# Patient Record
Sex: Female | Born: 1940 | Race: White | Hispanic: No | Smoking: Former smoker
Health system: Southern US, Community
[De-identification: ages and names within clinical notes are randomized; demographics above are authoritative.]

## PROBLEM LIST (undated history)

## (undated) DIAGNOSIS — Z8673 Personal history of transient ischemic attack (TIA), and cerebral infarction without residual deficits: Secondary | ICD-10-CM

## (undated) DIAGNOSIS — M4802 Spinal stenosis, cervical region: Secondary | ICD-10-CM

## (undated) DIAGNOSIS — I350 Nonrheumatic aortic (valve) stenosis: Secondary | ICD-10-CM

## (undated) DIAGNOSIS — R42 Dizziness and giddiness: Secondary | ICD-10-CM

## (undated) DIAGNOSIS — R251 Tremor, unspecified: Secondary | ICD-10-CM

## (undated) DIAGNOSIS — I4891 Unspecified atrial fibrillation: Secondary | ICD-10-CM

## (undated) DIAGNOSIS — M797 Fibromyalgia: Secondary | ICD-10-CM

## (undated) DIAGNOSIS — M1990 Unspecified osteoarthritis, unspecified site: Secondary | ICD-10-CM

## (undated) DIAGNOSIS — I509 Heart failure, unspecified: Secondary | ICD-10-CM

## (undated) DIAGNOSIS — I482 Chronic atrial fibrillation, unspecified: Secondary | ICD-10-CM

## (undated) DIAGNOSIS — R12 Heartburn: Secondary | ICD-10-CM

## (undated) DIAGNOSIS — Z95810 Presence of automatic (implantable) cardiac defibrillator: Secondary | ICD-10-CM

## (undated) DIAGNOSIS — G2581 Restless legs syndrome: Secondary | ICD-10-CM

## (undated) DIAGNOSIS — E785 Hyperlipidemia, unspecified: Secondary | ICD-10-CM

## (undated) DIAGNOSIS — I499 Cardiac arrhythmia, unspecified: Secondary | ICD-10-CM

## (undated) DIAGNOSIS — I2510 Atherosclerotic heart disease of native coronary artery without angina pectoris: Secondary | ICD-10-CM

## (undated) DIAGNOSIS — G4730 Sleep apnea, unspecified: Secondary | ICD-10-CM

## (undated) DIAGNOSIS — Z8719 Personal history of other diseases of the digestive system: Secondary | ICD-10-CM

## (undated) DIAGNOSIS — I639 Cerebral infarction, unspecified: Secondary | ICD-10-CM

## (undated) DIAGNOSIS — T8859XA Other complications of anesthesia, initial encounter: Secondary | ICD-10-CM

## (undated) DIAGNOSIS — I209 Angina pectoris, unspecified: Secondary | ICD-10-CM

## (undated) DIAGNOSIS — R413 Other amnesia: Secondary | ICD-10-CM

## (undated) DIAGNOSIS — I071 Rheumatic tricuspid insufficiency: Secondary | ICD-10-CM

## (undated) DIAGNOSIS — R0602 Shortness of breath: Secondary | ICD-10-CM

## (undated) DIAGNOSIS — T4145XA Adverse effect of unspecified anesthetic, initial encounter: Secondary | ICD-10-CM

## (undated) DIAGNOSIS — G459 Transient cerebral ischemic attack, unspecified: Secondary | ICD-10-CM

## (undated) DIAGNOSIS — I429 Cardiomyopathy, unspecified: Secondary | ICD-10-CM

## (undated) DIAGNOSIS — F419 Anxiety disorder, unspecified: Secondary | ICD-10-CM

## (undated) DIAGNOSIS — R51 Headache: Secondary | ICD-10-CM

## (undated) DIAGNOSIS — Q396 Congenital diverticulum of esophagus: Secondary | ICD-10-CM

## (undated) DIAGNOSIS — I872 Venous insufficiency (chronic) (peripheral): Secondary | ICD-10-CM

## (undated) DIAGNOSIS — E538 Deficiency of other specified B group vitamins: Secondary | ICD-10-CM

## (undated) DIAGNOSIS — I340 Nonrheumatic mitral (valve) insufficiency: Secondary | ICD-10-CM

## (undated) DIAGNOSIS — F32A Depression, unspecified: Secondary | ICD-10-CM

## (undated) DIAGNOSIS — I10 Essential (primary) hypertension: Secondary | ICD-10-CM

## (undated) DIAGNOSIS — Z8711 Personal history of peptic ulcer disease: Secondary | ICD-10-CM

## (undated) DIAGNOSIS — R911 Solitary pulmonary nodule: Secondary | ICD-10-CM

## (undated) DIAGNOSIS — S2249XA Multiple fractures of ribs, unspecified side, initial encounter for closed fracture: Secondary | ICD-10-CM

## (undated) HISTORY — PX: CERVICAL DISC SURGERY: SHX588

## (undated) HISTORY — DX: Personal history of other diseases of the digestive system: Z87.19

## (undated) HISTORY — PX: APPENDECTOMY: SHX54

## (undated) HISTORY — PX: JOINT REPLACEMENT: SHX530

## (undated) HISTORY — PX: INSERT / REPLACE / REMOVE PACEMAKER: SUR710

## (undated) HISTORY — DX: Unspecified atrial fibrillation: I48.91

## (undated) HISTORY — DX: Congenital diverticulum of esophagus: Q39.6

## (undated) HISTORY — PX: CARDIAC CATHETERIZATION: SHX172

## (undated) HISTORY — DX: Personal history of peptic ulcer disease: Z87.11

## (undated) HISTORY — DX: Hyperlipidemia, unspecified: E78.5

## (undated) HISTORY — DX: Transient cerebral ischemic attack, unspecified: G45.9

## (undated) HISTORY — DX: Cerebral infarction, unspecified: I63.9

## (undated) HISTORY — DX: Heartburn: R12

## (undated) HISTORY — PX: OTHER SURGICAL HISTORY: SHX169

## (undated) HISTORY — PX: CHOLECYSTECTOMY: SHX55

## (undated) HISTORY — PX: BILATERAL OOPHORECTOMY: SHX1221

## (undated) HISTORY — PX: TUBAL LIGATION: SHX77

## (undated) HISTORY — PX: TONSILLECTOMY: SUR1361

---

## 1997-08-12 ENCOUNTER — Inpatient Hospital Stay (HOSPITAL_COMMUNITY): Admission: RE | Admit: 1997-08-12 | Discharge: 1997-08-14 | Payer: Self-pay | Admitting: Cardiology

## 1997-09-12 ENCOUNTER — Inpatient Hospital Stay (HOSPITAL_COMMUNITY): Admission: EM | Admit: 1997-09-12 | Discharge: 1997-09-14 | Payer: Self-pay | Admitting: Cardiology

## 1998-01-07 ENCOUNTER — Inpatient Hospital Stay (HOSPITAL_COMMUNITY): Admission: EM | Admit: 1998-01-07 | Discharge: 1998-01-08 | Payer: Self-pay | Admitting: Emergency Medicine

## 1998-01-08 ENCOUNTER — Encounter: Payer: Self-pay | Admitting: Cardiology

## 2003-10-16 ENCOUNTER — Other Ambulatory Visit: Payer: Self-pay

## 2003-10-17 ENCOUNTER — Other Ambulatory Visit: Payer: Self-pay

## 2005-04-08 ENCOUNTER — Ambulatory Visit: Payer: Self-pay | Admitting: Nurse Practitioner

## 2005-05-30 ENCOUNTER — Ambulatory Visit: Payer: Self-pay | Admitting: *Deleted

## 2005-07-10 ENCOUNTER — Ambulatory Visit: Payer: Self-pay | Admitting: Nurse Practitioner

## 2005-12-09 ENCOUNTER — Other Ambulatory Visit: Payer: Self-pay

## 2005-12-09 ENCOUNTER — Emergency Department: Payer: Self-pay | Admitting: Emergency Medicine

## 2006-12-07 ENCOUNTER — Ambulatory Visit: Payer: Self-pay | Admitting: Internal Medicine

## 2007-02-09 ENCOUNTER — Ambulatory Visit: Payer: Self-pay | Admitting: Internal Medicine

## 2007-02-11 ENCOUNTER — Observation Stay (HOSPITAL_COMMUNITY): Admission: RE | Admit: 2007-02-11 | Discharge: 2007-02-12 | Payer: Self-pay | Admitting: Internal Medicine

## 2007-02-11 ENCOUNTER — Ambulatory Visit: Payer: Self-pay | Admitting: Internal Medicine

## 2007-02-26 ENCOUNTER — Ambulatory Visit: Payer: Self-pay

## 2007-06-23 ENCOUNTER — Ambulatory Visit: Payer: Self-pay | Admitting: Internal Medicine

## 2007-07-07 ENCOUNTER — Ambulatory Visit: Payer: Self-pay | Admitting: Unknown Physician Specialty

## 2007-08-20 ENCOUNTER — Ambulatory Visit: Payer: Self-pay | Admitting: Unknown Physician Specialty

## 2007-10-04 ENCOUNTER — Encounter: Admission: RE | Admit: 2007-10-04 | Discharge: 2007-10-04 | Payer: Self-pay | Admitting: Neurology

## 2007-10-25 ENCOUNTER — Ambulatory Visit: Payer: Self-pay | Admitting: Internal Medicine

## 2008-01-17 ENCOUNTER — Ambulatory Visit: Payer: Self-pay | Admitting: Internal Medicine

## 2008-03-01 ENCOUNTER — Ambulatory Visit: Payer: Self-pay | Admitting: Internal Medicine

## 2008-03-15 ENCOUNTER — Encounter: Payer: Self-pay | Admitting: Internal Medicine

## 2008-04-24 ENCOUNTER — Ambulatory Visit: Payer: Self-pay | Admitting: Internal Medicine

## 2008-09-07 ENCOUNTER — Emergency Department: Payer: Self-pay | Admitting: Emergency Medicine

## 2009-03-06 ENCOUNTER — Ambulatory Visit: Payer: Self-pay | Admitting: Gynecologic Oncology

## 2009-03-27 ENCOUNTER — Ambulatory Visit: Payer: Self-pay | Admitting: Gynecologic Oncology

## 2009-04-03 ENCOUNTER — Ambulatory Visit: Payer: Self-pay | Admitting: Gynecologic Oncology

## 2009-04-19 ENCOUNTER — Ambulatory Visit: Payer: Self-pay | Admitting: Unknown Physician Specialty

## 2009-04-24 ENCOUNTER — Ambulatory Visit: Payer: Self-pay | Admitting: Unknown Physician Specialty

## 2009-05-04 ENCOUNTER — Ambulatory Visit: Payer: Self-pay | Admitting: Gynecologic Oncology

## 2009-07-17 ENCOUNTER — Ambulatory Visit: Payer: Self-pay | Admitting: Rheumatology

## 2009-07-23 ENCOUNTER — Ambulatory Visit: Payer: Self-pay | Admitting: Unknown Physician Specialty

## 2009-08-02 ENCOUNTER — Ambulatory Visit: Payer: Self-pay | Admitting: Pain Medicine

## 2009-08-13 ENCOUNTER — Ambulatory Visit: Payer: Self-pay | Admitting: Pain Medicine

## 2009-09-13 ENCOUNTER — Ambulatory Visit: Payer: Self-pay | Admitting: Pain Medicine

## 2009-09-14 ENCOUNTER — Encounter (INDEPENDENT_AMBULATORY_CARE_PROVIDER_SITE_OTHER): Payer: Self-pay | Admitting: *Deleted

## 2009-09-24 ENCOUNTER — Ambulatory Visit: Payer: Self-pay | Admitting: Pain Medicine

## 2009-09-24 ENCOUNTER — Other Ambulatory Visit: Payer: Self-pay | Admitting: Pain Medicine

## 2009-11-07 ENCOUNTER — Ambulatory Visit: Payer: Self-pay | Admitting: Pain Medicine

## 2009-11-09 ENCOUNTER — Ambulatory Visit: Payer: Self-pay | Admitting: Pain Medicine

## 2009-11-19 ENCOUNTER — Ambulatory Visit: Payer: Self-pay | Admitting: Pain Medicine

## 2009-12-11 ENCOUNTER — Ambulatory Visit: Payer: Self-pay | Admitting: Pain Medicine

## 2009-12-17 ENCOUNTER — Ambulatory Visit: Payer: Self-pay | Admitting: Pain Medicine

## 2009-12-25 ENCOUNTER — Ambulatory Visit: Payer: Self-pay | Admitting: Urology

## 2010-01-17 ENCOUNTER — Ambulatory Visit: Payer: Self-pay | Admitting: Pain Medicine

## 2010-01-23 ENCOUNTER — Ambulatory Visit: Payer: Self-pay | Admitting: Pain Medicine

## 2010-02-21 ENCOUNTER — Ambulatory Visit: Payer: Medicare Other | Admitting: Pain Medicine

## 2010-02-26 ENCOUNTER — Emergency Department: Payer: Self-pay | Admitting: Emergency Medicine

## 2010-03-05 NOTE — Letter (Signed)
Summary: Device-Delinquent Check  [facility], Main Office  [address]   [place], [place] [address]   Phone: [phone]  Fax: [phone]     September 14, 2009 MRN: [id number]   [facility] [address] [place], [place]  [address]   Dear [name],  According to our records, you have not had your implanted device checked in the recommended period of time.  We are unable to determine appropriate device function without checking your device on a regular basis.  Please call our office to schedule an appointment, with Dr. Graciela Husbands,  as soon as possible.  If you are having your device checked by another physician, please call us so that we may update our records.  Thank you,  Altha Harm, LPN  September 14, 2009 2:14 PM  [facility]  certified mail

## 2010-03-11 ENCOUNTER — Ambulatory Visit: Payer: Self-pay | Admitting: Pain Medicine

## 2010-03-18 ENCOUNTER — Ambulatory Visit: Payer: Self-pay | Admitting: Pain Medicine

## 2010-04-09 ENCOUNTER — Ambulatory Visit: Payer: Self-pay | Admitting: Pain Medicine

## 2010-05-07 ENCOUNTER — Ambulatory Visit: Payer: Self-pay | Admitting: Pain Medicine

## 2010-05-20 ENCOUNTER — Ambulatory Visit: Payer: Self-pay | Admitting: Pain Medicine

## 2010-06-08 ENCOUNTER — Other Ambulatory Visit: Payer: Self-pay | Admitting: Internal Medicine

## 2010-06-18 NOTE — Letter (Signed)
February 09, 2007    [name], M.D.  [address]  [place], [place] [address]   RE:  [name], [name]  MRN:  [id number]  /  DOB:  Oct 08, 1940   Dear [name]:   It is a pleasure to see [name] at your request to consider CRT  upgrade for her pacemaker previously implanted for uncontrolled atrial  fibrillation and status post AV junction ablation.  This was undertaken  by Dr. Patrecia Pace down at [facility] about three years ago.  Unfortunately it was not associated with any significant improvement in  her exercise tolerance.   She has significant LV dysfunction with ejection fraction of about 25%-  30%; somewhat interestingly her __________  scan demonstrated ejection  fraction of 50% but echocardiogram and catheterization both confirmed  the ejection fraction was about 30%.  She also had an elevated left  ventricular diastolic pressure at catheterization; her coronary arteries  were normal.   She sleeps with three pillows, but only one behind her head.  She has  peripheral edema but she does not have nocturnal dyspnea.  She has had  no recent syncope.  She had had syncope in the remote past.   As noted she is status post pacemaker implantation for AV ablation; she  is pacer dependent.   Her past medical history is notable for:  1. Reflux disease and hiatal hernia.  2. Arthritis.  3. Diabetes.  4. Fatigue.  5. Asthma.  6. Depression.   Her past cardiac history in addition to rhe above is notable for an  attempted ablation of something in November of 2004.  It should be noted  that Dr. Windell Norfolk did the procedure at [facility] failing an attempt to  __________  and maintain sinus rhythm.   Her past surgical history is notable for:  1. C-sections x4.  2,  Arthroscopic surgery x2 with total knee replacements.  1. Vulvar surgery.   Her review of systems is broadly negative apart from the above except  for the fact that her son after six years of amyotrophic  lateral  sclerosis has decided to, stop fighting.   Social History:  She is divorced.  She bore four children.  One died  just prior to delivery in a motor vehicle accident.  Another son died at  2 1/2 from a drowning accident and as note above, a third child is dying  of amyotrophic lateral sclerosis.   She does not use cigarettes, alcohol or recreational drugs.   Her current medications include:  1. Actos 15  2. Hydrochlorothiazide 25  3. Lipitor 10.  4. Meloxicam 7.5.  5. Metoprolol 50 b.i.d.  6. Omeprazole 20.  7. Quinapril 40.  8. Sertraline 50.  9. Warfarin.  10.Loratadine.   She is allergic to SULFA and DOPAMINE.   On examination she is an elderly Caucasian female appearing somewhat  older than her stated age of 68.  Her height is 5'4 and her weight is  280 pounds.  Her pulse is 70.  Her HEENT exam demonstrated no __________  The neck veins were 8-9 cm.  The carotids were brisk and full  bilaterally without bruits.  The back was without kyphosis or scoliosis.  Her lungs were clear.  Heart sounds were regular with an early systolic  murmur heard best at the right upper sternal border.  The abdomen was  protuberant but soft.  Bowel sounds were active.  No midline pulsation  was appreciated.  Femoral pulses were  2+.  Distal pulses were intact.  There is no clubbing, cyanosis or edema.  There is no clubbing or  cyanosis.  There is 1+ peripheral edema bilaterally.  Neurological exam  was grossly normal and the skin was warm and dry.   Electrocardiogram dated today demonstrated underlying atrial  fibrillation with a ventricular paced rate of 70 beats per minute with a  QRS ratio of about 180 msec.  The axis was leftward of -70 degrees.   IMPRESSION:  1. Congestive heart failure - class 3.  2. Nonischemic cardiomyopathy.  3. Atrial fibrillation - permanent 4, status post AV junction ablation      and single chamber VVI pacemaker probably in the right ventricular       apex.  4. Morbid obesity.  5. Depression with difficult social situation.  6. Remote history of syncope.   [name], [name] has a nonischemic cardiomyopathy, congestive heart  failure and she is right ventricularly apically paced.  Your thought as  to whether she might benefit from CRT upgrade is a very good one; the  data are that about 50%-70% of these people will be better or much  better following left ventricular lead placement.  The results in this  chord of patients is somewhat less optimistic than it is in patients  with underlying left bundle branch block.   She is hoping to be able to lose weight and hopefully she will get some  improvement in her exercise capacity and can work on that following the  procedure.   We discussed the potential benefits as well as potential risks of the  procedure including, but not limited to, (a) infection, (b) perforation  of either the lung or the heart if lead is replaced, (c) lead  dislodgement, (d) device malfunction, (e) vascular injury and/or death.  She understands these risks and would like to proceed.   We will plan to initially undertake a venogram of the left upper  extremity to see whether it is patent.  In the event that it is, we  would use this as our conduit.  In the event that it is not, we would  use the right side.  I would plan, if we use the left side, to pirate  the right ventricular apical lead to eliminate the possibility of lead  dislodgement with a fresh implant.   The other thing that she and I talked about was whether she would be  better off on carvedilol or metoprolol flexinate as opposed to  metoprolol tartrate.  I suspect the metoprolol tartrate was started for  rate control.   We actually had a break in the schedule so we tentatively set her up for  this Thursday.   I will talk to you following the procedure.   Thanks very much for allowing [place] to see her.    Sincerely,      Duke Salvia, MD,  [facility]  Electronically Signed    SCK/MedQ  DD: 02/09/2007  DT: 02/09/2007  Job #: [id number]

## 2010-06-18 NOTE — Letter (Signed)
October 25, 2007    Arnoldo Hooker, M.D.  [address]  [place], [place] [address]   RE:  [name], [name]  MRN:  [id number]  /  DOB:  1940-12-26   Dear [name]:   [name] came in today in followup of her CRT implanted as an  upgrade in January following AV junction ablation done by Dr. Gwinda Passe  down at [facility].   She feels that this was success, although her breathing remains somewhat  short.  There was improved energy and less resting dyspnea.   She is having some peripheral edema.   She also has been concerned about her memory and went to go to see a  neurologist, who undertook CT scan examination and then said that she  has been having some strokes.  We do not have a baseline for this.   Review of her medications demonstrates that her diuretic is  hydrochlorothiazide and her beta-blocker is Coreg at 12.5 b.i.d.  She is  on quinapril 40 as well as warfarin.   PHYSICAL EXAMINATION:  VITAL SIGNS:  Her weight is 276, which is down  about 5 or 6 pounds; her blood pressure was 121/85, and her pulse was  70.  LUNGS:  Clear.  HEART:  Sounds were regular.  EXTREMITIES:  Had just trace edema.   Interrogation of her St. Jude CRT-D demonstrated a known intrinsic  ventricular rhythm.  The impedance in the RV was 390 with a threshold of  0.5 at 0.5.  The LV impedance was 1050 with a threshold of 0.75 at 1.   There are no intercurrent therapies.   IMPRESSION:  1. Atrial fibrillation with uncontrolled ventricular response status      post atrioventricular junction ablation.  2. Nonischemic cardiomyopathy.  3. Status post pacer with cardiac resynchronization therapy upgrade      for #1 and #4.  4. Obesity.  5. Congestive heart failure - chronic - systolic manifested with both      fluid overload and dyspnea.   I have taken the liberty, [name], to seize her hydrochlorothiazide and  put her on Lasix at 20 mg every other day for couple of weeks.  She is  to  call us after that and let us know how she is doing.  At that point,  I have suggested that she increase her Coreg to 18.75 on weighted target  dose of 25-50 twice a day, but I thought we will start her 18.75 and  have her followup with you a couple of weeks after that and you could  further uptitrate her as you saw best.   We will plan to see her again in 3 months' time, at which time, we will  establish a remote followup.     Sincerely,      Duke Salvia, MD, [facility]  Electronically Signed    SCK/MedQ  DD: 10/25/2007  DT: 10/26/2007  Job #: [id number]

## 2010-06-18 NOTE — Discharge Summary (Signed)
NAMESISTER, CARBONE                ACCOUNT NO.:  [id number]   MEDICAL RECORD NO.:  [id number]          PATIENT TYPE:  INP   LOCATION:  3733                         FACILITY:  [facility]   PHYSICIAN:  Duke Salvia, MD, FACCDATE OF BIRTH:  [birth year]   DATE OF ADMISSION:  02/11/2007  DATE OF DISCHARGE:  02/12/2007                               DISCHARGE SUMMARY    This dictation and exam greater than 35 minutes.   FINAL DIAGNOSIS:  Discharging day one status post explant of existing  pacemaker, discharging day one status post implant St. Jude Promote RF  CRT-D system.   SECONDARY DIAGNOSES:  1. Nonischemic cardiomyopathy, ejection fraction 25-30% at      catheterization, normal coronary anatomy.  2. New York Heart Association III chronic systolic congestive heart      failure.  3. Atrial fibrillation with rapid ventricular rate in the past status      post AV node ablation and pacemaker implantation.  4. Diabetes.  5. GERD.  6. Hiatal hernia.  7. Hypertension.  8. Dyslipidemia.  9. Status post bilateral total knee arthroplasties.  10.Dyslipidemia.  11.Remote history of syncope.  12.Chronic Coumadin therapy.   PROCEDURE:  February 12, 2007 Explant of existing pacemaker with implant  of St. Jude Promote RF biventricular cardioverter defibrillator, Dr.  Sherryl Manges.  The patient tolerated the procedure well.  There was no  hematoma afterward.  All values check out with interrogation within  normal limits.  X-ray shows no pneumothorax, and the leads are in  appropriate position.  The patient discharging day #1.   BRIEF HISTORY:  [name] is a 70 year old female.  She is referred for  CRT upgrade of her pacemaker.  This was previously implanted for atrial  fibrillation with rapid rates, and the patient was AV node ablation in  the past.  This was done by Dr. Patrecia Pace at [facility] 3 years ago.  Unfortunately, the patient does not have  any  improvement in her exercise tolerance.   The patient has significant left ventricular dysfunction, ejection  fraction about 25-30%.  This was confirmed at catheterization where the  EF was determined at 30%.  She also had elevated left ventricular  diastolic pressure, but her coronary anatomy was totally normal  angiographically.   The patient sleeps with three pillows.  She has peripheral edema but no  nocturnal dyspnea.  She has had no recent syncope, but she had syncope  in the remote past.   Her past cardiac history is notable for ablation in November 2004.  Dr.  Deno Lunger did the procedure at [facility], and apparently attempt to maintain  sinus rhythm was unsuccessful.   Past surgery history notable for C-sections x4, two total knee  replacements and vulvar surgery.   Electrocardiogram shows underlying atrial fibrillation, ventricular lead  placed at 70 beats per minute.  QRS is about 180 milliseconds.   The patient has Class III congestive heart failure symptoms.  The plan  will be to undertake a venogram of the left upper extremities see if it  is patent.  If it is, this will be our conduit.  Otherwise, we will use  the right side.  If the left side was used, pirating of the right  ventricular apical lead will be done to eliminate possibility of lead  dislodgement with a fresh implant.  The other thing is that the patient  is probably going to be better off on Carvedilol as opposed to  metoprolol tartrate.  Metoprolol tartrate probably started for rate  control.  The patient will present electively on Thursday, January 8,  for the procedure.   HOSPITAL COURSE:  The patient presented electively January 8.  She  underwent explant of her existing pacemaker with successful implant of  the St. Jude Promote CRT-D system.  The patient has had no postprocedure  complications, discharging day one.  Her metoprolol succinate has indeed  been changed to Coreg at discharge.  She is asked to  keep her incision  dry for the next 7 days and to sponge bathe until Thursday, January 15.   MEDICATIONS:  Now are:  1. Coreg 12.5 mg twice daily, a new medication.  2. Keflex 500 mg 1 tablet 1/2 hour before meals and at bedtime; this      is a 5-day course of antibiotic.  3. Actos 15 mg daily.  4. Hydrochlorothiazide 25 mg daily.  5. Lipitor 10 mg daily at bedtime.  6. Meloxicam 7.5 mg daily.  7. Omeprazole 20 mg daily.  8. Quinapril 40 mg daily.  9. Sertraline 50 mg daily.  10.Loratadine 10 mg daily.  11.Coumadin 2.5 mg Tuesday and Thursday, 1.25 mg all other days.   The patient is asked to stop taking metoprolol.   She has follow-up at [facility].  1. ICD Clinic Friday January 23 at 9:30.  2. She will see Dr. Graciela Husbands there Thursday March 5 at 3:45.      Maple Mirza, [place]      Duke Salvia, MD, [facility]  Electronically Signed    GM/MEDQ  D:  02/12/2007  T:  02/12/2007  Job:  [id number]   cc:   Duke Salvia, MD, Waynetta Pean, M.D.  Dan Humphreys, M.D.

## 2010-06-18 NOTE — Letter (Signed)
January 17, 2008    Yates Decamp, MD  [address]  [place], [place] [address]   RE:  ARAYAH, KROUSE  MRN:  [id number]  /  DOB:  [birth year]   Dear Dr. Dan Humphreys,   Ms. Monia Pouch comes in today in follow up for a congestive heart failure,  which is stable.  She is status post AV junction ablation and CRT  implantation at [facility].   She has had just some trace of swelling.   Her other history is notable for nocturnal obstructive breathing and  daytime somnolence (see below).   Medications currently include:  1. Actos 50.  2. Lasix 40.  3. Coreg 18.75 b.i.d.  4. Metformin 500 b.i.d.  5. Warfarin.  6. Sertraline 50.  7. Quinapril 40.  8. Omeprazole 20.  9. Meloxicam 7.5.  10.Lipitor 10.   On examination, her blood pressure is quite high at 170/86, I repeated  after about 30 minutes, it was still 160, her pulse was 69, and weight  was 271, which is down 5 pounds.  Her lungs were clear.  Heart sounds  were regular today.  Neck veins were not discernible.  Abdomen was soft,  and the extremities had no edema.   Interrogation of her St. Jude device demonstrates no intrinsic  ventricular rhythm with an RV impedance of 390 and LV impedance of 1100,  RV threshold 0.5 at 5.5 and the LV 1 V at 1.  High voltage impedance was  47 ohms.  There were no intercurrent therapies.   IMPRESSION:  1. Nonischemic cardiomyopathy status post atrioventricular junction      ablation for atrial fibrillation with uncontrolled ventricular      response.  2. Status post pacer for the above with cardiac resynchronization      therapy upgrade.  3. Tinnitus with implicatable medications including Lipitor, Lasix,      and Coreg.  4. Hypertension - worse.  5. Symptoms consistent with obstructive sleep apnea.   It was a pleasure seeing her today.  As I had mentioned on the voice  mail, I thought it may be worth someone with your expertise helping [place]  think about whether a sleep study might be of  value.  As relates to her  tinnitus, I have taken the opportunity of changing her statin from  Lipitor to Crestor and it may well be that perhaps it has something to  do with her Coreg and her Lasix if the tinnitus persists.   Hopefully, the treatment of sleep apnea may also help her blood  pressure.    Sincerely,      Duke Salvia, MD, [facility]  Electronically Signed    SCK/MedQ  DD: 01/17/2008  DT: 01/18/2008  Job #: [id number]   CC:    Arnoldo Hooker, MD

## 2010-06-18 NOTE — Op Note (Signed)
[name], [name]                ACCOUNT NO.:  [id number]   MEDICAL RECORD NO.:  [id number]          PATIENT TYPE:  INP   LOCATION:  2856                         FACILITY:  [facility]   PHYSICIAN:  Duke Salvia, MD, FACCDATE OF BIRTH:  1940-10-10   DATE OF PROCEDURE:  02/11/2007  DATE OF DISCHARGE:                               OPERATIVE REPORT   PREOPERATIVE DIAGNOSIS:  Complete heart block, congestive heart failure,  previous atrioventricular junction ablation, previous pacemaker  implantation, and cardiomyopathy.   POSTOPERATIVE DIAGNOSIS:  Complete heart block, congestive heart  failure, previous atrioventricular junction ablation, previous pacemaker  implantation, and cardiomyopathy.   PROCEDURE:  Contrast venogram, implantation of a left ventricular lead,  implantation of a defibrillator system, pocket revision, and  intraoperative defibrillation threshold testing.   Following obtaining informed consent, the patient was brought to the  electrophysiology laboratory and placed on the fluoroscopic table in  supine position.  After routine prep and drape, lidocaine was  infiltrated along the line of the previous incision to be carried down  towards the prepectoral fascia, with care taken to avoid opening of the  pocket of the previous device.  This having been accomplished,  venipuncture was accomplished on two separate occasions.  Guidewires  were placed in sequentially.  An 8-French and a 9.5-French sheath were  placed which passed a St. Jude Durata 7121 65-cm dual coil active  fixation defibrillator lead, serial number [id number], and a Medtronic MB2  coronary sinus cannulation catheter.  The RV lead was admitted through  the RV apex somewhat distal to the previously implanted lead where the  bipolar paced R wave was 8 with a pace impedance of 869 ohms, and a  threshold of 0.9 volts at 0.5 milliseconds.  Current of threshold was  1.1 MA.  There was no diaphragmatic pacing at 10  volts, and the current  of injury was brisk.  This lead was secured to the prepectoral fascia.  Through the previously implanted MB2 cannulation catheter, a Wholey wire  was placed, and this allowed for cannulation relatively easily of the  proximal portion of the coronary sinus.  The wire would not pass, and  venogram demonstrated a large valve in the midportion of the coronary  sinus.  Using Wholey wires and subsequently a Whisper wire and a St.  Jude lead, we were able scoot the MB2 sheath past the valve.  We then  took a contrast venogram which demonstrated no lateral branches.  Exploration of the anterolateral surface identified one vein, and we  actually were able to place the wire there and test it using the bipolar  St. Jude lead; however, no pacing could be accomplished at 5 volts in  any configuration.  The other lead that was evident was the anterior  branch; however, electrogram intervals between the RV paced site and the  LV anterior vein site were only approximately 30 milliseconds, so this  was also abandoned.   Initial contrast venogram had demonstrated a large posterior branch that  coursed laterally, and so we then pursued this.  Selective venogram  demonstrated two branches,  one coursing laterally more proximally than  the other.  The apical one was tried first.  Unfortunately, we had  diaphragmatic stimulation across its course, and we ended up using at  this point an [place] lead from [employer], [id number].  This was  manipulated to its most distal ramification which was on the lateral  wall near the junction between mid and distal thirds.  In this location,  the paced L wave was about 10 mV as I recall, with a threshold 2.6 volts  at 1 millisecond.  As this was our only residual site, we chose it.  We  then removed the delivery system under fluoroscopic guidance, and the  lead position was stable.  We took the lead and secured it to the  prepectoral fascia.   At  this point, we opened up the pocket for the pacemaker and removed the  previously implanted system.  The atrial lead was put into the atrial  port of the defibrillator (Promote RF [id number], model number [id number]) without  further assessment.  The RV lead was put into the paced sensed portion  of the defibrillator, as the lead was chronic.  Pacing threshold was 0.5  volts of 0.5 milliseconds, impedance was 380 ohms through the  defibrillator.   The LV impedance through the device was greater than 2000 ohms with a  threshold 3 volts at 1 millisecond, and the bipolar configuration and  unipolar configuration was  1.7 volts at 1 millisecond from tip to coil.  High-voltage impedance was then tested, and it was 50 ohms.   At this point, defibrillation threshold testing was undertaken.  Ventricular fibrillation was induced via the fibber mode.  After a total  duration of about 9 seconds, a 15-joule shock was delivered through a  measured resistance of 46 ohms, terminating ventricular fibrillation and  restoring atrial fibrillation complete heart block rhythm.  The device  was implanted.  The pocket was copiously irrigated with antibiotic-  containing saline solution.  Hemostasis was assured, and the leads and  pulse generator were placed in the pocket and secured to the prepectoral  fascia.  The wound was then closed in two layers in the normal fashion.  The wound was washed, dried, and a benzoin Steri-Strip dressing was  applied.  Needle counts, sponge counts, and instrument counts were  correct at the end of the procedure according to staff.      Duke Salvia, MD, [facility]  Electronically Signed     SCK/MEDQ  D:  02/11/2007  T:  02/11/2007  Job:  [id number]   cc:   Arnoldo Hooker, M.D.  [facility]  Electrophysiology Laboratory  [facility]

## 2010-06-20 ENCOUNTER — Ambulatory Visit: Payer: Self-pay | Admitting: Pain Medicine

## 2010-07-03 ENCOUNTER — Ambulatory Visit: Payer: Self-pay | Admitting: Pain Medicine

## 2010-07-03 ENCOUNTER — Other Ambulatory Visit: Payer: Self-pay | Admitting: Pain Medicine

## 2010-07-17 ENCOUNTER — Ambulatory Visit: Payer: Self-pay | Admitting: Pain Medicine

## 2010-07-23 ENCOUNTER — Encounter: Payer: Self-pay | Admitting: Internal Medicine

## 2010-07-29 ENCOUNTER — Ambulatory Visit: Payer: Self-pay | Admitting: Unknown Physician Specialty

## 2010-08-20 ENCOUNTER — Ambulatory Visit: Payer: Self-pay | Admitting: Pain Medicine

## 2010-08-22 ENCOUNTER — Encounter: Payer: Self-pay | Admitting: Internal Medicine

## 2010-08-26 ENCOUNTER — Ambulatory Visit: Payer: Self-pay | Admitting: Pain Medicine

## 2010-09-10 ENCOUNTER — Other Ambulatory Visit: Payer: Self-pay | Admitting: Internal Medicine

## 2010-09-18 ENCOUNTER — Ambulatory Visit: Payer: Self-pay | Admitting: Pain Medicine

## 2010-10-01 ENCOUNTER — Ambulatory Visit: Payer: Self-pay | Admitting: Pain Medicine

## 2010-10-12 ENCOUNTER — Other Ambulatory Visit: Payer: Self-pay | Admitting: Internal Medicine

## 2010-10-24 LAB — PROTIME-INR: Prothrombin Time: 21.9 — ABNORMAL HIGH

## 2010-11-14 ENCOUNTER — Ambulatory Visit: Payer: Self-pay | Admitting: Pain Medicine

## 2010-11-25 ENCOUNTER — Ambulatory Visit: Payer: Self-pay | Admitting: Pain Medicine

## 2010-12-16 ENCOUNTER — Ambulatory Visit: Payer: Self-pay | Admitting: Pain Medicine

## 2010-12-23 ENCOUNTER — Other Ambulatory Visit: Payer: Self-pay | Admitting: Pain Medicine

## 2010-12-23 ENCOUNTER — Ambulatory Visit: Payer: Self-pay | Admitting: Pain Medicine

## 2011-02-05 ENCOUNTER — Ambulatory Visit: Payer: Self-pay | Admitting: Pain Medicine

## 2011-02-10 ENCOUNTER — Ambulatory Visit: Payer: Self-pay | Admitting: Pain Medicine

## 2011-02-17 ENCOUNTER — Ambulatory Visit: Payer: Self-pay | Admitting: Pain Medicine

## 2011-03-10 ENCOUNTER — Other Ambulatory Visit: Payer: Self-pay | Admitting: Neurosurgery

## 2011-03-10 DIAGNOSIS — M542 Cervicalgia: Secondary | ICD-10-CM

## 2011-03-19 ENCOUNTER — Ambulatory Visit
Admission: RE | Admit: 2011-03-19 | Discharge: 2011-03-19 | Disposition: A | Discharge status: Home / Self Care | Payer: Self-pay | Source: Ambulatory Visit | Attending: Neurosurgery | Admitting: Neurosurgery

## 2011-03-19 ENCOUNTER — Ambulatory Visit
Admission: RE | Admit: 2011-03-19 | Discharge: 2011-03-19 | Disposition: A | Discharge status: Home / Self Care | Payer: Medicare Other | Source: Ambulatory Visit | Attending: Neurosurgery | Admitting: Neurosurgery

## 2011-03-19 DIAGNOSIS — M542 Cervicalgia: Secondary | ICD-10-CM

## 2011-03-19 MED ORDER — ONDANSETRON HCL 4 MG/2ML IJ SOLN: 4.0000 mg | Freq: Four times a day (QID) | INTRAMUSCULAR | Status: DC | PRN

## 2011-03-19 MED ORDER — DIAZEPAM 5 MG PO TABS
5.0000 mg | ORAL_TABLET | Freq: Once | ORAL | Status: AC
  Administered 2011-03-19: 5 mg via ORAL

## 2011-03-19 NOTE — Discharge Instructions (Signed)
Myelogram  Discharge Instructions  1. Go home and rest quietly for the next 24 hours.  It is important to lie flat for the next 24 hours.  Get up only to go to the restroom.  You may lie in the bed or on a couch on your back, your stomach, your left side or your right side.  You may have one pillow under your head.  You may have pillows between your knees while you are on your side or under your knees while you are on your back.  2. DO NOT drive today.  Recline the seat as far back as it will go, while still wearing your seat belt, on the way home.  3. You may get up to go to the bathroom as needed.  You may sit up for 10 minutes to eat.  You may resume your normal diet and medications unless otherwise indicated.  Drink lots of extra fluids today and tomorrow.  4. The incidence of headache, nausea, or vomiting is about 5% (one in 20 patients).  If you develop a headache, lie flat and drink plenty of fluids until the headache goes away.  Caffeinated beverages may be helpful.  If you develop severe nausea and vomiting or a headache that does not go away with flat bed rest, call [phone].  5. You may resume normal activities after your 24 hours of bed rest is over; however, do not exert yourself strongly or do any heavy lifting tomorrow.  6. Call your physician for a follow-up appointment.  The results of your myelogram will be sent directly to your physician by the following day.  7. If you have any questions or if complications develop after you arrive home, please call [phone].  Discharge instructions have been explained to the patient.  The patient, or the person responsible for the patient, fully understands these instructions.   May resume warfarin today. May resume sertraline and tramadol on March 20, 2011, after 12:30 pm.

## 2011-03-19 NOTE — Progress Notes (Addendum)
Explained discharge instructions to pt, consent signed and valium given  1412 up to bathroom, gait is steady. Will dress to go home now.

## 2011-03-24 ENCOUNTER — Ambulatory Visit: Payer: Self-pay | Admitting: Pain Medicine

## 2011-04-07 ENCOUNTER — Other Ambulatory Visit: Payer: Self-pay

## 2011-04-07 ENCOUNTER — Encounter (HOSPITAL_COMMUNITY): Payer: Self-pay

## 2011-04-07 ENCOUNTER — Ambulatory Visit (HOSPITAL_COMMUNITY)
Admission: RE | Admit: 2011-04-07 | Discharge: 2011-04-07 | Disposition: A | Discharge status: Home / Self Care | Payer: Medicare Other | Source: Ambulatory Visit | Attending: Anesthesiology | Admitting: Anesthesiology

## 2011-04-07 ENCOUNTER — Encounter (HOSPITAL_COMMUNITY): Payer: Self-pay | Admitting: Pharmacy Technician

## 2011-04-07 ENCOUNTER — Encounter (HOSPITAL_COMMUNITY)
Admission: RE | Admit: 2011-04-07 | Discharge: 2011-04-07 | Disposition: A | Discharge status: Home / Self Care | Payer: Medicare Other | Source: Ambulatory Visit | Attending: Neurosurgery | Admitting: Neurosurgery

## 2011-04-07 HISTORY — DX: Anxiety disorder, unspecified: F41.9

## 2011-04-07 HISTORY — DX: Essential (primary) hypertension: I10

## 2011-04-07 HISTORY — DX: Cardiac arrhythmia, unspecified: I49.9

## 2011-04-07 HISTORY — DX: Fibromyalgia: M79.7

## 2011-04-07 HISTORY — DX: Presence of automatic (implantable) cardiac defibrillator: Z95.810

## 2011-04-07 HISTORY — DX: Personal history of transient ischemic attack (TIA), and cerebral infarction without residual deficits: Z86.73

## 2011-04-07 HISTORY — DX: Angina pectoris, unspecified: I20.9

## 2011-04-07 HISTORY — DX: Heart failure, unspecified: I50.9

## 2011-04-07 HISTORY — DX: Headache: R51

## 2011-04-07 HISTORY — DX: Shortness of breath: R06.02

## 2011-04-07 HISTORY — DX: Unspecified osteoarthritis, unspecified site: M19.90

## 2011-04-07 HISTORY — DX: Sleep apnea, unspecified: G47.30

## 2011-04-07 LAB — CBC
HCT: 38 % (ref 36.0–46.0)
Hemoglobin: 12.2 g/dL (ref 12.0–15.0)
MCHC: 32.1 g/dL (ref 30.0–36.0)

## 2011-04-07 LAB — BASIC METABOLIC PANEL
BUN: 10 mg/dL (ref 6–23)
Chloride: 107 mEq/L (ref 96–112)
GFR calc Af Amer: >90 mL/min (ref >90)
Potassium: 3.9 mEq/L (ref 3.5–5.1)

## 2011-04-07 LAB — SURGICAL PCR SCREEN
MRSA, PCR: NEGATIVE
Staphylococcus aureus: NEGATIVE

## 2011-04-07 LAB — PROTIME-INR: INR: 1.51 — ABNORMAL HIGH (ref 0.00–1.49)

## 2011-04-07 LAB — APTT: aPTT: 55 seconds — ABNORMAL HIGH (ref 24–37)

## 2011-04-07 NOTE — Pre-Procedure Instructions (Signed)
20 [name]   04/07/2011   Your procedure is scheduled on:  Wednesday, MARCH 6 TH  Report to [facility] at  6:30 AM.   Call this number if you have problems the morning of surgery: [phone]   Remember:   Do not eat food:After Midnight Tuesday.  May have clear liquids: up to 4 Hours before arrival time -- 2:30 AM.  Clear liquids include soda, tea, black coffee, apple or grape juice, broth.   Take these medicines the morning of surgery with A SIP OF WATER:  Carvedilol,              Omeprazole, Zoloft, Tramadol.   Do not wear jewelry, make-up or nail polish.  Do not wear lotions, powders, or perfumes. You may wear deodorant.  Do not shave 48 hours prior to surgery.   Do not bring valuables to the hospital.   Contacts, dentures or bridgework may not be worn into surgery.  Leave suitcase in the car. After surgery it may be brought to your room.  For patients admitted to the hospital, checkout time is 11:00 AM the day of discharge.   Patients discharged the day of surgery will not be allowed to drive home.   Name and phone number of your driver:  Nickisha Hum  -- Son   Special Instructions: CHG Shower Use Special Wash: 1/2 bottle night before surgery and 1/2 bottle morning of surgery.   Please read over the following fact sheets that you were given: Pain Booklet, MRSA Information and Surgical Site Infection Prevention

## 2011-04-07 NOTE — Progress Notes (Addendum)
SHE SAW A GI MD OVER AT [facility] WHO DID A ENDOSCOPY ---HE SAID SHE HAD A LARGE "POCKET" IN HER ESOPHAGUS...SHE HAS VOMITED ON OCCAS .Marland KitchenMarland KitchenMarland KitchenHAVE REQUESTED INFO ABOUT THIS  PCP IS DR Donata Duff....................Marland KitchenCardio IS DR. Gwen Pounds  BOTH OVER AT [facility]  PACER  CHECK WAS DONE JUST THE OTHER DAY  IN [facility] OFFICE--WILL SENT ICD FORM TO THEM ALSO  PT STATES THAT SHE HAS HAD  2  TIA'S .Marland KitchenMarland KitchenLAST ONE IN 2005 IN [place]. SHE HAS BEEN ON COUMADIN FOR THIS DX.Marland KitchenMarland KitchenTHE PA IN HER  PRIMARY CARE OFFICE, KNOWS OF UPCOMING SURGERY AND HAS PLACED HER ON LOVENOX...LAST DOSE WILL BE TOMORROW... A  ZELENAK, PA  WOULD LIKE ANOTHER PT/PTT DRAWN DOS...Marland KitchenMarland Kitchen

## 2011-04-08 ENCOUNTER — Encounter (HOSPITAL_COMMUNITY): Payer: Self-pay | Admitting: Vascular Surgery

## 2011-04-08 ENCOUNTER — Other Ambulatory Visit (HOSPITAL_COMMUNITY): Payer: Medicare Other

## 2011-04-08 MED ORDER — CEFAZOLIN SODIUM-DEXTROSE 2-3 GM-% IV SOLR
2.0000 g | INTRAVENOUS | Status: AC
  Administered 2011-04-09: 2 g via INTRAVENOUS
  Filled 2011-04-08: qty 50

## 2011-04-08 MED ORDER — DEXAMETHASONE SODIUM PHOSPHATE 10 MG/ML IJ SOLN
10.0000 mg | Freq: Once | INTRAMUSCULAR | Status: AC
  Administered 2011-04-09: 10 mg via INTRAVENOUS
  Filled 2011-04-08: qty 1

## 2011-04-08 NOTE — Anesthesia Preprocedure Evaluation (Addendum)
Anesthesia Evaluation  Patient identified by MRN, date of birth, ID band Patient awake    Reviewed: Allergy & Precautions, H&P , NPO status , Patient's Chart, lab work & pertinent test results, reviewed documented beta blocker date and time   History of Anesthesia Complications Negative for: history of anesthetic complications  Airway Mallampati: II  Neck ROM: Limited  Mouth opening: Limited Mouth Opening  Dental  (+) Teeth Intact and Caps   Pulmonary shortness of breath and with exertion, sleep apnea, Continuous Positive Airway Pressure Ventilation and Oxygen sleep apnea ,  breath sounds clear to auscultation        Cardiovascular hypertension, Pt. on home beta blockers + angina +CHF + dysrhythmias Atrial Fibrillation Rhythm:Regular Rate:Normal  St. Jude AICD,  Normal LV systolic function, EF 50%, mild MI/TI (echo 04/2010) Mild CAD, RCA 30% cath 11/08   Neuro/Psych  Headaches, Anxiety Depression    GI/Hepatic Neg liver ROS, hiatal hernia, GERD-  Medicated and Controlled,Esophageal diverticulum   Endo/Other  Diabetes mellitus-, Well Controlled, Oral Hypoglycemic AgentsMorbid obesity  Renal/GU negative Renal ROS Bladder dysfunction      Musculoskeletal  (+) Fibromyalgia -  Abdominal (+) + obese,   Peds  Hematology   Anesthesia Other Findings   Reproductive/Obstetrics                         Anesthesia Physical Anesthesia Plan  ASA: III  Anesthesia Plan: General   Post-op Pain Management:    Induction: Intravenous  Airway Management Planned: Oral ETT  Additional Equipment:   Intra-op Plan:   Post-operative Plan: Extubation in OR  Informed Consent: I have reviewed the patients History and Physical, chart, labs and discussed the procedure including the risks, benefits and alternatives for the proposed anesthesia with the patient or authorized representative who has indicated his/her  understanding and acceptance.   Dental advisory given  Plan Discussed with: CRNA and Surgeon  Anesthesia Plan Comments:         Anesthesia Quick Evaluation

## 2011-04-08 NOTE — Progress Notes (Signed)
Received ..1. Written orders                    2. periop rx for pacer                     3. Cath 11/03/                     4. EGD 08/20/07

## 2011-04-08 NOTE — Progress Notes (Signed)
Spoke with Kerry Fort rep for [employer].   He knows pt will arrive at 0630 ... And  He was advised of Dr Philemon Kingdom reply to perio op rx ... Marland Kitchen

## 2011-04-08 NOTE — Consult Note (Addendum)
Anesthesia:  Patient is a 71 year old female scheduled for a 1 level ACDF (orders still pending) on 04/09/11.  History includes afib with SSS s/p AV node ablation and now has a [employer] Jude ICD implanted by Dr. Graciela Husbands on 02/11/07, HTN, DM2, OSA with CPAP use, minimal CAD by cardiac cath 11/08 with 30% RCA stenosis and moderate LV systolic dysfunction by echo 10/08 (see below for more recent echo results), CHF, fibromyalgia, anxiety, headaches, multiple small TIAs for which she is managed on Coumadin, former smoker, and history of angina 5-6 years ago.  She also reports what sounds like a Zenker's diverticulum (described a "large" diverticulum in the middle third of the esophagus by a 08/20/07 endoscopy).  Her primary Cardiologist is Dr. Arnoldo Hooker at [facility].  His NP has faxed a formal note of clearance although it is fairly illegible (dark) once faxed (I did confirm the clearance with his office, however.).    Although her device was placed by Dr. Graciela Husbands, she is now followed at [facility].    Echo on 04/24/10 showed normal LV systolic function, EF 50%, mild MI/TI.     Labs noted.  She will get a repeat PT/PTT on the day of surgery.  She is on a Lovenox bridge while off her Coumadin (per Dr. Gwen Pounds).  CXR showed moderate cardiomegaly with permanent pacemaker. No active lung disease.    EKG showed V-paced rhythm.  I have called (and re-faxed form) [facility] re-requesting the perioperative cardiac device Rx be completed.  If not received, the emergency protocol will have to be utilized.  Addendum: 04/08/11 1630  I spoke with Rudi Coco, NP for Dr. Gwen Pounds.  Plan for AICD to be disabled in the holding area and reprogrammed post-operatively.  Patient is pacer dependent.  I updated [employer] Rep [employer].  He and his associate will be at [facility] first thing tomorrow morning.  One of them will plan to be in Neuro Holding by 0815.  Call [phone] if needed sooner.

## 2011-04-09 ENCOUNTER — Inpatient Hospital Stay (HOSPITAL_COMMUNITY): Payer: Medicare Other

## 2011-04-09 ENCOUNTER — Encounter (HOSPITAL_COMMUNITY): Payer: Self-pay | Admitting: Neurosurgery

## 2011-04-09 ENCOUNTER — Encounter (HOSPITAL_COMMUNITY): Payer: Self-pay | Admitting: Vascular Surgery

## 2011-04-09 ENCOUNTER — Inpatient Hospital Stay (HOSPITAL_COMMUNITY): Payer: Medicare Other | Admitting: Vascular Surgery

## 2011-04-09 ENCOUNTER — Encounter (HOSPITAL_COMMUNITY)
Admission: RE | Disposition: A | Discharge status: Home / Self Care | Payer: Self-pay | Source: Ambulatory Visit | Attending: Neurosurgery

## 2011-04-09 ENCOUNTER — Inpatient Hospital Stay (HOSPITAL_COMMUNITY)
Admission: RE | Admit: 2011-04-09 | Discharge: 2011-04-10 | DRG: 472 | Disposition: A | Discharge status: Home / Self Care | Payer: Medicare Other | Source: Ambulatory Visit | Attending: Neurosurgery | Admitting: Neurosurgery

## 2011-04-09 ENCOUNTER — Encounter (HOSPITAL_COMMUNITY): Payer: Self-pay | Admitting: *Deleted

## 2011-04-09 DIAGNOSIS — Z01810 Encounter for preprocedural cardiovascular examination: Secondary | ICD-10-CM

## 2011-04-09 DIAGNOSIS — M4802 Spinal stenosis, cervical region: Secondary | ICD-10-CM | POA: Diagnosis present

## 2011-04-09 DIAGNOSIS — Z96659 Presence of unspecified artificial knee joint: Secondary | ICD-10-CM

## 2011-04-09 DIAGNOSIS — Z8673 Personal history of transient ischemic attack (TIA), and cerebral infarction without residual deficits: Secondary | ICD-10-CM

## 2011-04-09 DIAGNOSIS — Z7901 Long term (current) use of anticoagulants: Secondary | ICD-10-CM

## 2011-04-09 DIAGNOSIS — Z01812 Encounter for preprocedural laboratory examination: Secondary | ICD-10-CM

## 2011-04-09 DIAGNOSIS — I10 Essential (primary) hypertension: Secondary | ICD-10-CM | POA: Diagnosis present

## 2011-04-09 DIAGNOSIS — I509 Heart failure, unspecified: Secondary | ICD-10-CM | POA: Diagnosis present

## 2011-04-09 DIAGNOSIS — IMO0001 Reserved for inherently not codable concepts without codable children: Secondary | ICD-10-CM | POA: Diagnosis present

## 2011-04-09 DIAGNOSIS — Z87891 Personal history of nicotine dependence: Secondary | ICD-10-CM

## 2011-04-09 DIAGNOSIS — F341 Dysthymic disorder: Secondary | ICD-10-CM | POA: Diagnosis present

## 2011-04-09 DIAGNOSIS — I4891 Unspecified atrial fibrillation: Secondary | ICD-10-CM | POA: Diagnosis present

## 2011-04-09 DIAGNOSIS — K219 Gastro-esophageal reflux disease without esophagitis: Secondary | ICD-10-CM | POA: Diagnosis present

## 2011-04-09 DIAGNOSIS — Z95810 Presence of automatic (implantable) cardiac defibrillator: Secondary | ICD-10-CM

## 2011-04-09 DIAGNOSIS — Z01818 Encounter for other preprocedural examination: Secondary | ICD-10-CM

## 2011-04-09 DIAGNOSIS — M47812 Spondylosis without myelopathy or radiculopathy, cervical region: Principal | ICD-10-CM | POA: Diagnosis present

## 2011-04-09 DIAGNOSIS — Z6841 Body Mass Index (BMI) 40.0 and over, adult: Secondary | ICD-10-CM

## 2011-04-09 DIAGNOSIS — Z79899 Other long term (current) drug therapy: Secondary | ICD-10-CM

## 2011-04-09 DIAGNOSIS — E119 Type 2 diabetes mellitus without complications: Secondary | ICD-10-CM | POA: Diagnosis present

## 2011-04-09 DIAGNOSIS — G4730 Sleep apnea, unspecified: Secondary | ICD-10-CM | POA: Diagnosis present

## 2011-04-09 HISTORY — PX: ANTERIOR CERVICAL DECOMP/DISCECTOMY FUSION: SHX1161

## 2011-04-09 HISTORY — DX: Other complications of anesthesia, initial encounter: T88.59XA

## 2011-04-09 HISTORY — DX: Adverse effect of unspecified anesthetic, initial encounter: T41.45XA

## 2011-04-09 LAB — CBC
HCT: 34.9 % — ABNORMAL LOW (ref 36.0–46.0)
Hemoglobin: 11 g/dL — ABNORMAL LOW (ref 12.0–15.0)
MCH: 28.7 pg (ref 26.0–34.0)
MCHC: 31.5 g/dL (ref 30.0–36.0)
RBC: 3.83 MIL/uL — ABNORMAL LOW (ref 3.87–5.11)

## 2011-04-09 LAB — GLUCOSE, CAPILLARY
Glucose-Capillary: 110 mg/dL — ABNORMAL HIGH (ref 70–99)
Glucose-Capillary: 120 mg/dL — ABNORMAL HIGH (ref 70–99)
Glucose-Capillary: 144 mg/dL — ABNORMAL HIGH (ref 70–99)
Glucose-Capillary: 208 mg/dL — ABNORMAL HIGH (ref 70–99)

## 2011-04-09 LAB — DIFFERENTIAL
Basophils Absolute: 0 10*3/uL (ref 0.0–0.1)
Basophils Relative: 0 % (ref 0–1)
Eosinophils Absolute: 0.1 10*3/uL (ref 0.0–0.7)
Neutro Abs: 5.2 10*3/uL (ref 1.7–7.7)
Neutrophils Relative %: 68 % (ref 43–77)

## 2011-04-09 LAB — TYPE AND SCREEN: Antibody Screen: NEGATIVE

## 2011-04-09 LAB — ABO/RH: ABO/RH(D): A POS

## 2011-04-09 SURGERY — ANTERIOR CERVICAL DECOMPRESSION/DISCECTOMY FUSION 1 LEVEL
Anesthesia: General | Site: Spine Cervical | Wound class: Clean

## 2011-04-09 MED ORDER — METFORMIN HCL 500 MG PO TABS
500.0000 mg | ORAL_TABLET | Freq: Two times a day (BID) | ORAL | Status: DC
  Administered 2011-04-09 – 2011-04-10 (×2): 500 mg via ORAL
  Filled 2011-04-09 (×4): qty 1

## 2011-04-09 MED ORDER — OXYCODONE-ACETAMINOPHEN 5-325 MG PO TABS: 1.0000 | ORAL_TABLET | ORAL | Status: DC | PRN

## 2011-04-09 MED ORDER — CYCLOBENZAPRINE HCL 10 MG PO TABS: 10.0000 mg | ORAL_TABLET | Freq: Three times a day (TID) | ORAL | Status: DC | PRN

## 2011-04-09 MED ORDER — PIOGLITAZONE HCL 15 MG PO TABS
15.0000 mg | ORAL_TABLET | Freq: Every day | ORAL | Status: DC
  Administered 2011-04-10: 15 mg via ORAL
  Filled 2011-04-09 (×2): qty 1

## 2011-04-09 MED ORDER — TOPIRAMATE 25 MG PO TABS
25.0000 mg | ORAL_TABLET | Freq: Two times a day (BID) | ORAL | Status: DC | PRN
  Filled 2011-04-09: qty 1

## 2011-04-09 MED ORDER — PANTOPRAZOLE SODIUM 40 MG PO TBEC
40.0000 mg | DELAYED_RELEASE_TABLET | Freq: Every day | ORAL | Status: DC
  Filled 2011-04-09: qty 1

## 2011-04-09 MED ORDER — FENTANYL CITRATE 0.05 MG/ML IJ SOLN
INTRAMUSCULAR | Status: DC | PRN
  Administered 2011-04-09 (×2): 50 ug via INTRAVENOUS
  Administered 2011-04-09 (×2): 25 ug via INTRAVENOUS

## 2011-04-09 MED ORDER — HYDROMORPHONE HCL PF 1 MG/ML IJ SOLN
INTRAMUSCULAR | Status: AC
  Filled 2011-04-09: qty 1

## 2011-04-09 MED ORDER — SODIUM CHLORIDE 0.9 % IJ SOLN: 3.0000 mL | INTRAMUSCULAR | Status: DC | PRN

## 2011-04-09 MED ORDER — THROMBIN 5000 UNITS EX KIT
PACK | CUTANEOUS | Status: DC | PRN
  Administered 2011-04-09 (×2): 5000 [IU] via TOPICAL

## 2011-04-09 MED ORDER — MIDAZOLAM HCL 5 MG/5ML IJ SOLN
INTRAMUSCULAR | Status: DC | PRN
  Administered 2011-04-09: 0.5 mg via INTRAVENOUS
  Administered 2011-04-09: 1 mg via INTRAVENOUS
  Administered 2011-04-09: 0.5 mg via INTRAVENOUS

## 2011-04-09 MED ORDER — LACTATED RINGERS IV SOLN
INTRAVENOUS | Status: DC | PRN
  Administered 2011-04-09 (×2): via INTRAVENOUS

## 2011-04-09 MED ORDER — PHENOL 1.4 % MT LIQD
1.0000 | OROMUCOSAL | Status: DC | PRN
  Filled 2011-04-09: qty 177

## 2011-04-09 MED ORDER — TRAMADOL HCL 50 MG PO TABS: 50.0000 mg | ORAL_TABLET | Freq: Three times a day (TID) | ORAL | Status: DC | PRN

## 2011-04-09 MED ORDER — SODIUM CHLORIDE 0.9 % IR SOLN
Status: DC | PRN
  Administered 2011-04-09: 10:00:00

## 2011-04-09 MED ORDER — HEMOSTATIC AGENTS (NO CHARGE) OPTIME
TOPICAL | Status: DC | PRN
  Administered 2011-04-09: 1 via TOPICAL

## 2011-04-09 MED ORDER — CARVEDILOL 12.5 MG PO TABS
12.5000 mg | ORAL_TABLET | Freq: Two times a day (BID) | ORAL | Status: DC
  Administered 2011-04-10: 12.5 mg via ORAL
  Filled 2011-04-09 (×4): qty 1

## 2011-04-09 MED ORDER — BISACODYL 10 MG RE SUPP: 10.0000 mg | Freq: Every day | RECTAL | Status: DC | PRN

## 2011-04-09 MED ORDER — SODIUM CHLORIDE 0.9 % IV SOLN
250.0000 mL | INTRAVENOUS | Status: DC
  Administered 2011-04-10: 250 mL via INTRAVENOUS

## 2011-04-09 MED ORDER — CEFAZOLIN SODIUM 1-5 GM-% IV SOLN
1.0000 g | Freq: Three times a day (TID) | INTRAVENOUS | Status: AC
Start: 2011-04-09 — End: 2011-04-10
  Administered 2011-04-09 – 2011-04-10 (×2): 1 g via INTRAVENOUS
  Filled 2011-04-09 (×2): qty 50

## 2011-04-09 MED ORDER — ONDANSETRON HCL 4 MG/2ML IJ SOLN: 4.0000 mg | INTRAMUSCULAR | Status: DC | PRN

## 2011-04-09 MED ORDER — MENTHOL 3 MG MT LOZG
1.0000 | LOZENGE | OROMUCOSAL | Status: DC | PRN
  Filled 2011-04-09: qty 9

## 2011-04-09 MED ORDER — ROCURONIUM BROMIDE 100 MG/10ML IV SOLN
INTRAVENOUS | Status: DC | PRN
  Administered 2011-04-09: 40 mg via INTRAVENOUS

## 2011-04-09 MED ORDER — HYDROMORPHONE HCL PF 1 MG/ML IJ SOLN
0.5000 mg | INTRAMUSCULAR | Status: DC | PRN
  Administered 2011-04-09: 1 mg via INTRAVENOUS
  Filled 2011-04-09: qty 1

## 2011-04-09 MED ORDER — LACTATED RINGERS IV SOLN: INTRAVENOUS | Status: DC

## 2011-04-09 MED ORDER — PROPOFOL 10 MG/ML IV EMUL
INTRAVENOUS | Status: DC | PRN
  Administered 2011-04-09: 160 mg via INTRAVENOUS

## 2011-04-09 MED ORDER — ACETAMINOPHEN 650 MG RE SUPP: 650.0000 mg | RECTAL | Status: DC | PRN

## 2011-04-09 MED ORDER — SENNA 8.6 MG PO TABS
1.0000 | ORAL_TABLET | Freq: Two times a day (BID) | ORAL | Status: DC
  Administered 2011-04-09 – 2011-04-10 (×3): 8.6 mg via ORAL
  Filled 2011-04-09 (×4): qty 1

## 2011-04-09 MED ORDER — GLYCOPYRROLATE 0.2 MG/ML IJ SOLN
INTRAMUSCULAR | Status: DC | PRN
  Administered 2011-04-09: .8 mg via INTRAVENOUS

## 2011-04-09 MED ORDER — ZOLPIDEM TARTRATE 5 MG PO TABS: 5.0000 mg | ORAL_TABLET | Freq: Every evening | ORAL | Status: DC | PRN

## 2011-04-09 MED ORDER — ONDANSETRON HCL 4 MG/2ML IJ SOLN
INTRAMUSCULAR | Status: DC | PRN
  Administered 2011-04-09: 4 mg via INTRAVENOUS

## 2011-04-09 MED ORDER — QUINAPRIL HCL 10 MG PO TABS: 40.0000 mg | ORAL_TABLET | Freq: Every day | ORAL | Status: DC

## 2011-04-09 MED ORDER — ATORVASTATIN CALCIUM 10 MG PO TABS
20.0000 mg | ORAL_TABLET | Freq: Every day | ORAL | Status: DC
  Administered 2011-04-09: 20 mg via ORAL
  Filled 2011-04-09 (×2): qty 2

## 2011-04-09 MED ORDER — SODIUM CHLORIDE 0.9 % IV SOLN
INTRAVENOUS | Status: AC
  Filled 2011-04-09: qty 500

## 2011-04-09 MED ORDER — SERTRALINE HCL 50 MG PO TABS
50.0000 mg | ORAL_TABLET | Freq: Every day | ORAL | Status: DC
  Administered 2011-04-10: 50 mg via ORAL
  Filled 2011-04-09: qty 1

## 2011-04-09 MED ORDER — HYDROCODONE-ACETAMINOPHEN 5-325 MG PO TABS
1.0000 | ORAL_TABLET | ORAL | Status: DC | PRN
  Administered 2011-04-09: 2 via ORAL
  Filled 2011-04-09: qty 2

## 2011-04-09 MED ORDER — MEPERIDINE HCL 25 MG/ML IJ SOLN: 6.2500 mg | INTRAMUSCULAR | Status: DC | PRN

## 2011-04-09 MED ORDER — ACETAMINOPHEN 325 MG PO TABS: 650.0000 mg | ORAL_TABLET | ORAL | Status: DC | PRN

## 2011-04-09 MED ORDER — POLYETHYLENE GLYCOL 3350 17 G PO PACK
17.0000 g | PACK | Freq: Every day | ORAL | Status: DC | PRN
  Filled 2011-04-09: qty 1

## 2011-04-09 MED ORDER — HYDROMORPHONE HCL PF 1 MG/ML IJ SOLN
0.2500 mg | INTRAMUSCULAR | Status: DC | PRN
  Administered 2011-04-09 (×3): 0.5 mg via INTRAVENOUS

## 2011-04-09 MED ORDER — NEOSTIGMINE METHYLSULFATE 1 MG/ML IJ SOLN
INTRAMUSCULAR | Status: DC | PRN
  Administered 2011-04-09: 4 mg via INTRAVENOUS

## 2011-04-09 MED ORDER — 0.9 % SODIUM CHLORIDE (POUR BTL) OPTIME
TOPICAL | Status: DC | PRN
  Administered 2011-04-09: 1000 mL

## 2011-04-09 MED ORDER — SODIUM CHLORIDE 0.9 % IV SOLN
10.0000 mg | INTRAVENOUS | Status: DC | PRN
  Administered 2011-04-09: 10 ug/min via INTRAVENOUS

## 2011-04-09 MED ORDER — FLEET ENEMA 7-19 GM/118ML RE ENEM: 1.0000 | ENEMA | Freq: Once | RECTAL | Status: AC | PRN

## 2011-04-09 MED ORDER — LISINOPRIL 40 MG PO TABS
40.0000 mg | ORAL_TABLET | Freq: Every day | ORAL | Status: DC
  Administered 2011-04-10: 40 mg via ORAL
  Filled 2011-04-09 (×2): qty 1

## 2011-04-09 MED ORDER — ALUM & MAG HYDROXIDE-SIMETH 200-200-20 MG/5ML PO SUSP: 30.0000 mL | Freq: Four times a day (QID) | ORAL | Status: DC | PRN

## 2011-04-09 MED ORDER — PROMETHAZINE HCL 25 MG/ML IJ SOLN: 6.2500 mg | INTRAMUSCULAR | Status: DC | PRN

## 2011-04-09 MED ORDER — BACITRACIN 50000 UNITS IM SOLR
INTRAMUSCULAR | Status: AC
  Filled 2011-04-09: qty 1

## 2011-04-09 MED ORDER — SODIUM CHLORIDE 0.9 % IJ SOLN
3.0000 mL | Freq: Two times a day (BID) | INTRAMUSCULAR | Status: DC
  Administered 2011-04-09 – 2011-04-10 (×2): 3 mL via INTRAVENOUS

## 2011-04-09 SURGICAL SUPPLY — 56 items
BAG DECANTER FOR FLEXI CONT (MISCELLANEOUS) ×2 IMPLANT
BENZOIN TINCTURE PRP APPL 2/3 (GAUZE/BANDAGES/DRESSINGS) ×2 IMPLANT
BRUSH SCRUB EZ PLAIN DRY (MISCELLANEOUS) ×2 IMPLANT
BUR MATCHSTICK NEURO 3.0 LAGG (BURR) ×2 IMPLANT
CANISTER SUCTION 2500CC (MISCELLANEOUS) ×2 IMPLANT
CLOTH BEACON ORANGE TIMEOUT ST (SAFETY) ×2 IMPLANT
CONT SPEC 4OZ CLIKSEAL STRL BL (MISCELLANEOUS) ×2 IMPLANT
DRAPE C-ARM 42X72 X-RAY (DRAPES) ×4 IMPLANT
DRAPE LAPAROTOMY 100X72 PEDS (DRAPES) ×2 IMPLANT
DRAPE MICROSCOPE ZEISS OPMI (DRAPES) IMPLANT
DRAPE POUCH INSTRU U-SHP 10X18 (DRAPES) ×2 IMPLANT
DRILL BIT (BIT) ×2 IMPLANT
ELECT COATED BLADE 2.86 ST (ELECTRODE) ×2 IMPLANT
ELECT REM PT RETURN 9FT ADLT (ELECTROSURGICAL) ×2
ELECTRODE REM PT RTRN 9FT ADLT (ELECTROSURGICAL) ×1 IMPLANT
GAUZE SPONGE 4X4 12PLY STRL LF (GAUZE/BANDAGES/DRESSINGS) ×2 IMPLANT
GAUZE SPONGE 4X4 16PLY XRAY LF (GAUZE/BANDAGES/DRESSINGS) IMPLANT
GLOVE BIOGEL PI IND STRL 7.0 (GLOVE) ×3 IMPLANT
GLOVE BIOGEL PI IND STRL 8 (GLOVE) ×1 IMPLANT
GLOVE BIOGEL PI INDICATOR 7.0 (GLOVE) ×3
GLOVE BIOGEL PI INDICATOR 8 (GLOVE) ×1
GLOVE ECLIPSE 7.5 STRL STRAW (GLOVE) ×2 IMPLANT
GLOVE ECLIPSE 8.5 STRL (GLOVE) ×2 IMPLANT
GLOVE EXAM NITRILE LRG STRL (GLOVE) IMPLANT
GLOVE EXAM NITRILE MD LF STRL (GLOVE) IMPLANT
GLOVE EXAM NITRILE XL STR (GLOVE) IMPLANT
GLOVE EXAM NITRILE XS STR PU (GLOVE) IMPLANT
GLOVE SS BIOGEL STRL SZ 6.5 (GLOVE) ×1 IMPLANT
GLOVE SUPERSENSE BIOGEL SZ 6.5 (GLOVE) ×1
GLOVE SURG SS PI 6.5 STRL IVOR (GLOVE) ×4 IMPLANT
GOWN BRE IMP SLV AUR LG STRL (GOWN DISPOSABLE) ×6 IMPLANT
GOWN BRE IMP SLV AUR XL STRL (GOWN DISPOSABLE) ×2 IMPLANT
GOWN STRL REIN 2XL LVL4 (GOWN DISPOSABLE) IMPLANT
HEAD HALTER (SOFTGOODS) ×2 IMPLANT
KIT BASIN OR (CUSTOM PROCEDURE TRAY) ×2 IMPLANT
KIT ROOM TURNOVER OR (KITS) ×2 IMPLANT
NEEDLE SPNL 20GX3.5 QUINCKE YW (NEEDLE) ×2 IMPLANT
NS IRRIG 1000ML POUR BTL (IV SOLUTION) ×2 IMPLANT
PACK LAMINECTOMY NEURO (CUSTOM PROCEDURE TRAY) ×2 IMPLANT
PAD ARMBOARD 7.5X6 YLW CONV (MISCELLANEOUS) ×6 IMPLANT
PLATE ATL VISION 25MM (Plate) ×2 IMPLANT
RUBBERBAND STERILE (MISCELLANEOUS) ×4 IMPLANT
SCREW VA ALT VISION 4.0X13 (Screw) ×8 IMPLANT
SPACER BONE CORNERSTONE 6X14 (Orthopedic Implant) ×2 IMPLANT
SPONGE GAUZE 4X4 12PLY (GAUZE/BANDAGES/DRESSINGS) ×2 IMPLANT
SPONGE INTESTINAL PEANUT (DISPOSABLE) ×2 IMPLANT
SPONGE SURGIFOAM ABS GEL SZ50 (HEMOSTASIS) ×2 IMPLANT
STRIP CLOSURE SKIN 1/2X4 (GAUZE/BANDAGES/DRESSINGS) ×2 IMPLANT
SUT PDS AB 5-0 P3 18 (SUTURE) ×2 IMPLANT
SUT VIC AB 3-0 SH 8-18 (SUTURE) ×2 IMPLANT
SYR 20ML ECCENTRIC (SYRINGE) ×2 IMPLANT
TAPE CLOTH 4X10 WHT NS (GAUZE/BANDAGES/DRESSINGS) ×2 IMPLANT
TAPE CLOTH SURG 4X10 WHT LF (GAUZE/BANDAGES/DRESSINGS) ×2 IMPLANT
TOWEL OR 17X24 6PK STRL BLUE (TOWEL DISPOSABLE) ×2 IMPLANT
TOWEL OR 17X26 10 PK STRL BLUE (TOWEL DISPOSABLE) ×2 IMPLANT
WATER STERILE IRR 1000ML POUR (IV SOLUTION) ×2 IMPLANT

## 2011-04-09 NOTE — Transfer of Care (Signed)
Immediate Anesthesia Transfer of Care Note  Patient: [name]  Procedure(s) Performed: Procedure(s) (LRB): ANTERIOR CERVICAL DECOMPRESSION/DISCECTOMY FUSION 1 LEVEL (N/A)  Patient Location: PACU  Anesthesia Type: General  Level of Consciousness: awake and sedated  Airway & Oxygen Therapy: Patient Spontanous Breathing and Patient connected to face mask oxygen  Post-op Assessment: Report given to PACU RN and Post -op Vital signs reviewed and stable  Post vital signs: Reviewed and stable  Complications: No apparent anesthesia complications

## 2011-04-09 NOTE — Progress Notes (Signed)
[name], rep from [employer], returned call, stated they are aware of pt.'s surgery and time and plan on being in neuro OR st 0815. He stated Lamar Sprinkles will be the Rep. For this case and her beeper is [phone].

## 2011-04-09 NOTE — Brief Op Note (Signed)
04/09/2011  10:23 AM  PATIENT:  [name]  71 y.o. female  PRE-OPERATIVE DIAGNOSIS:  spondylosis / stenosis  POST-OPERATIVE DIAGNOSIS:  Spondylosis/Stenosis  PROCEDURE:  Procedure(s) (LRB): ANTERIOR CERVICAL DECOMPRESSION/DISCECTOMY FUSION 1 LEVEL (N/A)  SURGEON:  Surgeon(s) and Role:    * Temple Pacini, MD - Primary    * Hewitt Shorts, MD - Assisting  PHYSICIAN ASSISTANT:   ASSISTANTS: Nudelman   ANESTHESIA:   general  EBL:  Total I/O In: -  Out: 150 [Blood:150]  BLOOD ADMINISTERED:none  DRAINS: none   LOCAL MEDICATIONS USED:  NONE  SPECIMEN:  No Specimen  DISPOSITION OF SPECIMEN:  N/A  COUNTS:  YES  TOURNIQUET:  * No tourniquets in log *  DICTATION: .Dragon Dictation  PLAN OF CARE: Admit to inpatient   PATIENT DISPOSITION:  PACU - hemodynamically stable.   Delay start of Pharmacological VTE agent (>24hrs) due to surgical blood loss or risk of bleeding: yes

## 2011-04-09 NOTE — Preoperative (Signed)
Beta Blockers   Reason not to administer Beta Blockers:Not Applicable 

## 2011-04-09 NOTE — H&P (Signed)
[name] is an 71 y.o. female.   Chief Complaint: Neck pain HPI: 71 year old female with chronic neck pain with some radiation into her shoulders and upper extremities. Patient has failed conservative management. Workup demonstrates evidence of marked spondylosis with stenosis at C5-6. Patient presents now for C5-6 anterior cervical discectomy and fusion. She has no lower tray symptoms. She has no bowel or bladder dysfunction. S2  Past Medical History  Diagnosis Date  . Dysrhythmia     A-FIB  . AICD (automatic cardioverter/defibrillator) present     PLACED BY  DR  Graciela Husbands 2008  . Hypertension   . Angina     5-6 YRS AGO  . CHF (congestive heart failure)   . Shortness of breath     EASING OFF NOW  . Diabetes mellitus   . Headache   . Fibromyalgia   . Arthritis   . Anxiety   . Sleep apnea     DOESN'T KNOW SETTINGS  . History of transient ischemic attack (TIA)     LAST ONE IN  2005  IN  [place], [place]  . Diverticulum of esophagus     large diverticulum in the middle third of the esophagus [facility] EGD '09)  . Complication of anesthesia     states at times had a tough time waking up    Past Surgical History  Procedure Date  . Cesarean section     X  4  . Insert / replace / remove pacemaker     ST  JUDE  DEVICE  . Cardiac catheterization     X 2    LAST ONE  2009  . Tonsillectomy   . Appendectomy   . Tubal ligation   . Bilateral oophorectomy     BENIGN  . Joint replacement     BIL  KNEES    History reviewed. No pertinent family history. Social History:  reports that she quit smoking about 20 years ago. Her smoking use included Cigarettes. She has a 20 pack-year smoking history. She has never used smokeless tobacco. She reports that she does not drink alcohol or use illicit drugs.  Allergies:  Allergies  Allergen Reactions  . Dopamine Nausea And Vomiting  . Sulfa Antibiotics Rash    Medications Prior to Admission  Medication Dose Route Frequency Provider Last Rate  Last Dose  . bacitracin 40981 UNITS injection           . ceFAZolin (ANCEF) IVPB 2 g/50 mL premix  2 g Intravenous 60 min Pre-Op Temple Pacini, MD      . dexamethasone (DECADRON) injection 10 mg  10 mg Intravenous Once Temple Pacini, MD      . sodium chloride 0.9 % infusion            Medications Prior to Admission  Medication Sig Dispense Refill  . carvedilol (COREG) 12.5 MG tablet Take 12.5 mg by mouth 2 (two) times daily with a meal.      . metFORMIN (GLUCOPHAGE) 500 MG tablet Take 500 mg by mouth 2 (two) times daily with a meal.      . omeprazole (PRILOSEC) 20 MG capsule Take 20 mg by mouth daily.      . pioglitazone (ACTOS) 15 MG tablet Take 15 mg by mouth daily.      . quinapril (ACCUPRIL) 40 MG tablet Take 40 mg by mouth daily.       . rosuvastatin (CRESTOR) 10 MG tablet Take 10 mg by mouth daily.      Marland Kitchen  sertraline (ZOLOFT) 50 MG tablet Take 50 mg by mouth daily.      Marland Kitchen topiramate (TOPAMAX) 25 MG tablet Take 25 mg by mouth 2 (two) times daily as needed. Fibromyalgia flare up      . traMADol (ULTRAM) 50 MG tablet Take 50 mg by mouth every 8 (eight) hours as needed. For pain      . warfarin (COUMADIN) 2.5 MG tablet Take 2.5 mg by mouth daily.        Results for orders placed during the hospital encounter of 04/09/11 (from the past 48 hour(s))  DIFFERENTIAL     Status: Normal   Collection Time   04/09/11  6:50 AM      Component Value Range Comment   Neutrophils Relative 68  43 - 77 (%)    Neutro Abs 5.2  1.7 - 7.7 (K/uL)    Lymphocytes Relative 23  12 - 46 (%)    Lymphs Abs 1.7  0.7 - 4.0 (K/uL)    Monocytes Relative 8  3 - 12 (%)    Monocytes Absolute 0.6  0.1 - 1.0 (K/uL)    Eosinophils Relative 1  0 - 5 (%)    Eosinophils Absolute 0.1  0.0 - 0.7 (K/uL)    Basophils Relative 0  0 - 1 (%)    Basophils Absolute 0.0  0.0 - 0.1 (K/uL)   APTT     Status: Abnormal   Collection Time   04/09/11  6:50 AM      Component Value Range Comment   aPTT 43 (*) 24 - 37 (seconds)   PROTIME-INR      Status: Abnormal   Collection Time   04/09/11  6:50 AM      Component Value Range Comment   Prothrombin Time 16.1 (*) 11.6 - 15.2 (seconds)    INR 1.26  0.00 - 1.49    CBC     Status: Abnormal   Collection Time   04/09/11  6:50 AM      Component Value Range Comment   WBC 7.6  4.0 - 10.5 (K/uL)    RBC 3.83 (*) 3.87 - 5.11 (MIL/uL)    Hemoglobin 11.0 (*) 12.0 - 15.0 (g/dL)    HCT 16.1 (*) 09.6 - 46.0 (%)    MCV 91.1  78.0 - 100.0 (fL)    MCH 28.7  26.0 - 34.0 (pg)    MCHC 31.5  30.0 - 36.0 (g/dL)    RDW 04.5  40.9 - 81.1 (%)    Platelets 215  150 - 400 (K/uL)   TYPE AND SCREEN     Status: Normal   Collection Time   04/09/11  6:55 AM      Component Value Range Comment   ABO/RH(D) A POS      Antibody Screen NEG      Sample Expiration 04/12/2011     GLUCOSE, CAPILLARY     Status: Abnormal   Collection Time   04/09/11  7:00 AM      Component Value Range Comment   Glucose-Capillary 110 (*) 70 - 99 (mg/dL)    Dg Chest 2 View  10/04/4780  *RADIOLOGY REPORT*  Clinical Data: Preop, has pacemaker with defibrillator, diabetes, hypertension, former smoking history  CHEST - 2 VIEW  Comparison: None  Findings: The lungs are clear but hyperaerated consistent with the degree of COPD.  A calcified granuloma is noted anteriorly the right lung base probably within the right middle lobe.  There is cardiomegaly present.  A  permanent pacemaker with AICD lead is noted.  No bony abnormality is seen.  IMPRESSION: Moderate cardiomegaly with permanent pacemaker.  No active lung disease.  Original Report Authenticated By: Juline Patch, M.D.    Review of Systems  Constitutional: Negative.   HENT: Negative.   Eyes: Negative.   Respiratory: Negative.   Cardiovascular: Negative.   Gastrointestinal: Negative.   Genitourinary: Negative.   Musculoskeletal: Negative.   Skin: Negative.   Neurological: Negative.   Endo/Heme/Allergies: Negative.   Psychiatric/Behavioral: Negative.     Blood pressure 107/67,  pulse 70, temperature 98.4 F (36.9 C), temperature source Oral, resp. rate 18, SpO2 95.00%. Physical Exam  Nursing note and vitals reviewed. Constitutional: She is oriented to person, place, and time. She appears well-developed and well-nourished. No distress.  HENT:  Head: Normocephalic and atraumatic.  Right Ear: External ear normal.  Left Ear: External ear normal.  Nose: Nose normal.  Mouth/Throat: Oropharynx is clear and moist.  Eyes: Conjunctivae are normal. Pupils are equal, round, and reactive to light.  Neck: Normal range of motion. Neck supple. No tracheal deviation present. No thyromegaly present.  Cardiovascular: Normal rate, regular rhythm, normal heart sounds and intact distal pulses.   Respiratory: Effort normal and breath sounds normal. No respiratory distress. She has no wheezes.  GI: Soft. Bowel sounds are normal. She exhibits no distension. There is no tenderness.  Neurological: She is alert and oriented to person, place, and time. She has normal reflexes. No cranial nerve deficit. Coordination normal.  Skin: Skin is warm and dry. She is not diaphoretic.  Psychiatric: She has a normal mood and affect. Her behavior is normal. Judgment and thought content normal.     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 Plan C5-6 anterior cervical discectomy and fusion with allograft and anterior plating. Risks and benefits have been explained. Patient wishes to proceed.  Erie Sica A 04/09/2011, 8:36 AM

## 2011-04-09 NOTE — Anesthesia Postprocedure Evaluation (Signed)
  Anesthesia Post-op Note  Patient: [name]  Procedure(s) Performed: Procedure(s) (LRB): ANTERIOR CERVICAL DECOMPRESSION/DISCECTOMY FUSION 1 LEVEL (N/A)  Patient Location: PACU  Anesthesia Type: General  Level of Consciousness: awake  Airway and Oxygen Therapy: Patient Spontanous Breathing  Post-op Pain: mild  Post-op Assessment: Post-op Vital signs reviewed, Patient's Cardiovascular Status Stable and Respiratory Function Stable  Post-op Vital Signs: stable  Complications: No apparent anesthesia complications

## 2011-04-09 NOTE — Plan of Care (Signed)
Problem: Consults Goal: Diagnosis - Spinal Surgery Outcome: Completed/Met Date Met:  04/09/11 C5-6 ACDF

## 2011-04-09 NOTE — Progress Notes (Signed)
Paged Kerry Fort, [employer] Rep x2 this am.

## 2011-04-09 NOTE — Op Note (Signed)
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  Surgeon:Berneda Piccininni A.Amran Malter, M.D.  Asst. Surgeon: Shirlean Kelly   Anesthesia: General  Indication: 71 year old female with chronic intractable neck pain failing conservative management. Workup demonstrates evidence of advanced spondylitic disease with spinal stenosis at C5-6. Patient presents now for C5-6 anterior cervical discectomy and fusion with allograft and anterior plating in hopes of improving her symptoms.  Operative note: After induction of anesthesia, patient positioned supine with the neck slightly extended and held in place with halter traction. Patient's anterior cervical region was prepped and draped sterilely. 10 blade is used to make a linear skin incision overlying the C5-6 interspace. This was carried down sharply through the platysma. The platysma was divided vertically and dissection proceeded on the medial border of the sternocleidomastoid muscle and carotid sheath. Trachea and esophagus were then mobilized and retracted towards the left. Prevertebral fascia was stripped off the anterior spinal column. Longus colli muscles and elevated bilaterally using electrocautery cautery. Deep self-retaining retractor was placed intraoperative x-ray was taken and the level was confirmed. The space at C5-6 then incised with a 15 blade in a rectangular fashion. Wide disc space cleanout was achieved using pituitary rongeurs forward and backward angle Carlen curettes Kerrison rongeurs and a high-speed drill. All elements of the disc were removed down to the level of the posterior annulus. Microscope was then brought into the field and used throughout the remainder of the discectomy. Remaining aspects of annulus and osteophytes removed  using a high-speed drill. Posterior longitudinal ligament was then elevated and resected in piecemeal fashion using Kerrison rongeurs. Underlying thecal sac was identified. A wide central decompression then performed by undercutting the bodies of C5 and C6. Decompression then proceeded out each row foramen. Wide anterior foraminotomies were performed along the course the exiting C6 nerve roots bilaterally. At this point a very thorough decompression had been achieved. There is no evidence of injury to thecal sac or nerve roots. Wound is then irrigated with and bike solution. Gelfoam was placed topically for hemostasis. 6 ulnar cornerstone allograft wedges and packed into place recessed off the 1 mm from the anterior cortical margin. 25 mm Atlantis anterior cervical plate was then placed over the C5 and C6 levels. This is an attachment or fluoroscopic guidance using 13 mm verbal and screws to each both levels. All 4 screws given a final tightening down to be solidly within bone. Locking screws were engaged both levels. Final images revealed good position the bone graft and hardware proper upper level with normal lamina spine. Wound is irrigated. Hemostasis was assured. Wound is then closed in a typical fashion. Steri-Strips sterile dressing were applied. There were no apparent. Complications. Patient tolerated the procedure well and returned to recovery postop.

## 2011-04-10 LAB — GLUCOSE, CAPILLARY

## 2011-04-10 MED ORDER — CYCLOBENZAPRINE HCL 10 MG PO TABS: 10.0000 mg | ORAL_TABLET | Freq: Three times a day (TID) | ORAL | Status: AC | PRN

## 2011-04-10 NOTE — Progress Notes (Signed)
Inpatient Diabetes Program Recommendations  AACE/ADA: New Consensus Statement on Inpatient Glycemic Control (2009)  Target Ranges:  Prepandial:   less than 140 mg/dL      Peak postprandial:   less than 180 mg/dL (1-2 hours)      Critically ill patients:  140 - 180 mg/dL   Reason for Visit: Pt with hx of diabetes  Inpatient Diabetes Program Recommendations Correction (SSI): Would benefit from sensitive correction insulin tidwc if needed. HgbA1C: Please check as there is no documented value this or past admissions Diet: Please change to carbohydrate modified as pt has diabetes  Note: Thank you, Lenor Coffin, RN, CNS, Diabetes Coordinator [phone])

## 2011-04-10 NOTE — Progress Notes (Addendum)
Pt chart reviewed, no d/c needs identified, PT and OT eval not completed, pt ambulatory, with collar as ordered. No DME needs. Pt has insurance for any medication needs. Per pt nurse no d/c needs identified.  Johny Shock RN  Case Manager [phone]

## 2011-04-10 NOTE — Progress Notes (Signed)
UR COMPLETED  

## 2011-04-10 NOTE — Discharge Summary (Signed)
Physician Discharge Summary  Patient ID: [name] MRN: [id number] DOB/AGE: February 22, 1940 71 y.o.  Admit date: 04/09/2011 Discharge date: 04/10/2011  Admission Diagnoses:  Discharge Diagnoses:  Principal Problem:  *Cervical spinal stenosis   Discharged Condition: good  Hospital Course: Patient admitted to the hospital where she underwent an uncomplicated C5-6 anterior cervical discectomy and fusion. No problems postoperatively. Pain relief good. Neurologically intact on exam. Consults:   Significant Diagnostic Studies:   Treatments:   Discharge Exam: Blood pressure 118/64, pulse 69, temperature 97.9 F (36.6 C), temperature source Oral, resp. rate 18, height 5\' 4"  (1.626 m), weight 121.1 kg (266 lb 15.6 oz), SpO2 95.00%. Awake and alert oriented and appropriate  Nerve function is intact. Motor and sensory function extremities is normal. Wound clean dry and intact with some mild surrounding ecchymosis. No neck swelling. Airway midline. Chest and abdomen benign. Plan discharge home.  Disposition:    Medication List  As of 04/10/2011  9:55 AM   STOP taking these medications         enoxaparin 100 MG/ML Soln         TAKE these medications         carvedilol 12.5 MG tablet   Commonly known as: COREG   Take 12.5 mg by mouth 2 (two) times daily with a meal.      cyclobenzaprine 10 MG tablet   Commonly known as: FLEXERIL   Take 1 tablet (10 mg total) by mouth 3 (three) times daily as needed for muscle spasms.      metFORMIN 500 MG tablet   Commonly known as: GLUCOPHAGE   Take 500 mg by mouth 2 (two) times daily with a meal.      omeprazole 20 MG capsule   Commonly known as: PRILOSEC   Take 20 mg by mouth daily.      pioglitazone 15 MG tablet   Commonly known as: ACTOS   Take 15 mg by mouth daily.      quinapril 40 MG tablet   Commonly known as: ACCUPRIL   Take 40 mg by mouth daily.      rosuvastatin 10 MG tablet   Commonly known as: CRESTOR   Take 10 mg by mouth  daily.      sertraline 50 MG tablet   Commonly known as: ZOLOFT   Take 50 mg by mouth daily.      topiramate 25 MG tablet   Commonly known as: TOPAMAX   Take 25 mg by mouth 2 (two) times daily as needed. Fibromyalgia flare up      traMADol 50 MG tablet   Commonly known as: ULTRAM   Take 50 mg by mouth every 8 (eight) hours as needed. For pain      warfarin 2.5 MG tablet   Commonly known as: COUMADIN   Take 2.5 mg by mouth daily.           Follow-up Information    Call Temple Pacini, MD.   Contact information:   [address] [place] [address] [phone]          Signed: Temple Pacini 04/10/2011, 9:55 AM

## 2011-04-10 NOTE — Progress Notes (Signed)
Orthopedic Tech Progress Note Patient Details:  [name] 1940/07/15 [id number]  Patient ID: [name], female   DOB: 1940/02/15, 71 y.o.   MRN: [id number]   [name] 04/10/2011, 11:19 AM Called bio-tech

## 2011-04-10 NOTE — Progress Notes (Signed)
04/10/2011 PT came by to perform evaluation on patient and she was dressed and rolling out of room in a WC to d/c home.  Cervical precaution handout provided and brief verbal education on precautions.  Rollene Rotunda Alysiah Suppa, PT, DPT [phone]

## 2011-04-11 ENCOUNTER — Encounter (HOSPITAL_COMMUNITY): Payer: Self-pay | Admitting: Neurosurgery

## 2011-05-13 ENCOUNTER — Ambulatory Visit: Payer: Self-pay | Admitting: Pain Medicine

## 2011-05-26 ENCOUNTER — Ambulatory Visit: Payer: Self-pay | Admitting: Pain Medicine

## 2011-06-26 ENCOUNTER — Ambulatory Visit: Payer: Self-pay | Admitting: Pain Medicine

## 2011-07-21 ENCOUNTER — Ambulatory Visit: Payer: Self-pay | Admitting: Pain Medicine

## 2011-08-21 ENCOUNTER — Ambulatory Visit: Payer: Self-pay | Admitting: Pain Medicine

## 2011-08-22 ENCOUNTER — Ambulatory Visit: Payer: Self-pay | Admitting: Unknown Physician Specialty

## 2011-09-01 ENCOUNTER — Ambulatory Visit: Payer: Self-pay | Admitting: Pain Medicine

## 2011-09-25 ENCOUNTER — Ambulatory Visit: Payer: Self-pay | Admitting: Unknown Physician Specialty

## 2011-09-25 LAB — PROTIME-INR
INR: 1.1
Prothrombin Time: 14.1 secs (ref 11.5–14.7)

## 2011-10-02 ENCOUNTER — Ambulatory Visit: Payer: Self-pay | Admitting: Pain Medicine

## 2011-10-15 ENCOUNTER — Ambulatory Visit: Payer: Self-pay | Admitting: Pain Medicine

## 2011-11-13 ENCOUNTER — Ambulatory Visit: Payer: Self-pay | Admitting: Pain Medicine

## 2011-11-26 ENCOUNTER — Ambulatory Visit: Payer: Self-pay | Admitting: Pain Medicine

## 2012-01-06 ENCOUNTER — Ambulatory Visit: Payer: Self-pay | Admitting: Pain Medicine

## 2012-01-11 ENCOUNTER — Emergency Department: Payer: Self-pay | Admitting: Emergency Medicine

## 2012-01-11 LAB — PROTIME-INR: Prothrombin Time: 30.3 secs — ABNORMAL HIGH (ref 11.5–14.7)

## 2012-01-11 LAB — BASIC METABOLIC PANEL
Anion Gap: 5 — ABNORMAL LOW (ref 7–16)
Chloride: 110 mmol/L — ABNORMAL HIGH (ref 98–107)
EGFR (Non-African Amer.): >60
Osmolality: 281 (ref 275–301)
Potassium: 3.8 mmol/L (ref 3.5–5.1)

## 2012-01-19 ENCOUNTER — Ambulatory Visit: Payer: Self-pay | Admitting: Pain Medicine

## 2012-02-19 ENCOUNTER — Ambulatory Visit: Payer: Self-pay | Admitting: Pain Medicine

## 2012-03-23 ENCOUNTER — Ambulatory Visit: Payer: Self-pay | Admitting: Pain Medicine

## 2012-04-27 ENCOUNTER — Ambulatory Visit: Payer: Self-pay | Admitting: Pain Medicine

## 2012-04-30 ENCOUNTER — Ambulatory Visit: Payer: Self-pay | Admitting: Pain Medicine

## 2012-05-10 ENCOUNTER — Ambulatory Visit: Payer: Self-pay | Admitting: Pain Medicine

## 2012-06-08 ENCOUNTER — Ambulatory Visit: Payer: Self-pay | Admitting: Pain Medicine

## 2012-07-20 ENCOUNTER — Ambulatory Visit: Payer: Self-pay | Admitting: Pain Medicine

## 2012-08-19 ENCOUNTER — Ambulatory Visit: Payer: Self-pay | Admitting: Pain Medicine

## 2012-08-30 ENCOUNTER — Ambulatory Visit: Payer: Self-pay | Admitting: Pain Medicine

## 2012-08-31 ENCOUNTER — Other Ambulatory Visit: Payer: Self-pay | Admitting: Neurosurgery

## 2012-08-31 DIAGNOSIS — M48061 Spinal stenosis, lumbar region without neurogenic claudication: Secondary | ICD-10-CM

## 2012-09-06 ENCOUNTER — Ambulatory Visit
Admission: RE | Admit: 2012-09-06 | Discharge: 2012-09-06 | Disposition: A | Discharge status: Home / Self Care | Payer: Medicare Other | Source: Ambulatory Visit | Attending: Neurosurgery | Admitting: Neurosurgery

## 2012-09-06 DIAGNOSIS — M48061 Spinal stenosis, lumbar region without neurogenic claudication: Secondary | ICD-10-CM

## 2012-09-06 MED ORDER — IOHEXOL 180 MG/ML  SOLN: 17.0000 mL | Freq: Once | INTRAMUSCULAR | Status: AC | PRN

## 2012-09-07 ENCOUNTER — Other Ambulatory Visit: Payer: Self-pay | Admitting: Neurosurgery

## 2012-09-07 DIAGNOSIS — M48061 Spinal stenosis, lumbar region without neurogenic claudication: Secondary | ICD-10-CM

## 2012-09-07 DIAGNOSIS — M5412 Radiculopathy, cervical region: Secondary | ICD-10-CM

## 2012-09-08 ENCOUNTER — Ambulatory Visit
Admission: RE | Admit: 2012-09-08 | Discharge: 2012-09-08 | Disposition: A | Discharge status: Home / Self Care | Payer: Medicare Other | Source: Ambulatory Visit | Attending: Neurosurgery | Admitting: Neurosurgery

## 2012-09-08 VITALS — BP 123/61 | HR 69

## 2012-09-08 DIAGNOSIS — M5412 Radiculopathy, cervical region: Secondary | ICD-10-CM

## 2012-09-08 DIAGNOSIS — M48061 Spinal stenosis, lumbar region without neurogenic claudication: Secondary | ICD-10-CM

## 2012-09-08 DIAGNOSIS — M4802 Spinal stenosis, cervical region: Secondary | ICD-10-CM

## 2012-09-08 MED ORDER — MEPERIDINE HCL 100 MG/ML IJ SOLN
100.0000 mg | Freq: Once | INTRAMUSCULAR | Status: AC
  Administered 2012-09-08: 100 mg via INTRAMUSCULAR

## 2012-09-08 MED ORDER — DIAZEPAM 5 MG PO TABS
5.0000 mg | ORAL_TABLET | Freq: Once | ORAL | Status: AC
  Administered 2012-09-08: 5 mg via ORAL

## 2012-09-08 MED ORDER — ONDANSETRON HCL 4 MG/2ML IJ SOLN
4.0000 mg | Freq: Once | INTRAMUSCULAR | Status: AC
  Administered 2012-09-08: 4 mg via INTRAMUSCULAR

## 2012-09-08 MED ORDER — IOHEXOL 300 MG/ML  SOLN
10.0000 mL | Freq: Once | INTRAMUSCULAR | Status: AC | PRN
  Administered 2012-09-08: 10 mL via INTRATHECAL

## 2012-09-08 NOTE — Progress Notes (Signed)
Patient's INR 09/07/12 is 1.1.  Patient has been off Zoloft and Tramadol at least four days and Coumadin at least a week.   jkl

## 2012-09-14 ENCOUNTER — Ambulatory Visit: Payer: Self-pay | Admitting: Pain Medicine

## 2012-09-21 ENCOUNTER — Other Ambulatory Visit: Payer: Self-pay | Admitting: Neurosurgery

## 2012-09-21 DIAGNOSIS — M4800 Spinal stenosis, site unspecified: Secondary | ICD-10-CM

## 2012-09-21 DIAGNOSIS — M5412 Radiculopathy, cervical region: Secondary | ICD-10-CM

## 2012-10-14 ENCOUNTER — Ambulatory Visit: Payer: Self-pay | Admitting: Pain Medicine

## 2012-10-20 ENCOUNTER — Ambulatory Visit: Payer: Self-pay | Admitting: Pain Medicine

## 2012-11-16 ENCOUNTER — Ambulatory Visit: Payer: Self-pay | Admitting: Pain Medicine

## 2012-11-30 DIAGNOSIS — Z95810 Presence of automatic (implantable) cardiac defibrillator: Secondary | ICD-10-CM | POA: Insufficient documentation

## 2012-11-30 DIAGNOSIS — I2510 Atherosclerotic heart disease of native coronary artery without angina pectoris: Secondary | ICD-10-CM | POA: Insufficient documentation

## 2012-12-07 ENCOUNTER — Ambulatory Visit: Payer: Self-pay | Admitting: Pain Medicine

## 2012-12-07 ENCOUNTER — Ambulatory Visit: Payer: Self-pay | Admitting: Cardiology

## 2012-12-07 LAB — URINALYSIS, COMPLETE
Hyaline Cast: 2
Ketone: NEGATIVE
Nitrite: POSITIVE
Ph: 6 (ref 4.5–8.0)
Protein: NEGATIVE
RBC,UR: 4 /HPF (ref 0–5)

## 2012-12-07 LAB — BASIC METABOLIC PANEL
Anion Gap: 5 — ABNORMAL LOW (ref 7–16)
BUN: 13 mg/dL (ref 7–18)
Calcium, Total: 9.2 mg/dL (ref 8.5–10.1)
Chloride: 106 mmol/L (ref 98–107)
Co2: 27 mmol/L (ref 21–32)
Creatinine: 0.86 mg/dL (ref 0.60–1.30)
EGFR (African American): >60
Glucose: 80 mg/dL (ref 65–99)
Osmolality: 275 (ref 275–301)
Potassium: 3.6 mmol/L (ref 3.5–5.1)
Sodium: 138 mmol/L (ref 136–145)

## 2012-12-07 LAB — CBC WITH DIFFERENTIAL/PLATELET
Basophil %: 0.4 %
Eosinophil %: 1.1 %
HCT: 36.1 % (ref 35.0–47.0)
HGB: 11.9 g/dL — ABNORMAL LOW (ref 12.0–16.0)
Lymphocyte #: 1.4 10*3/uL (ref 1.0–3.6)
MCHC: 33 g/dL (ref 32.0–36.0)
Monocyte #: 0.5 x10 3/mm (ref 0.2–0.9)
RBC: 3.95 10*6/uL (ref 3.80–5.20)
WBC: 7.3 10*3/uL (ref 3.6–11.0)

## 2012-12-07 LAB — APTT: Activated PTT: 75.5 secs — ABNORMAL HIGH (ref 23.6–35.9)

## 2012-12-07 LAB — PROTIME-INR: Prothrombin Time: 30.9 secs — ABNORMAL HIGH (ref 11.5–14.7)

## 2012-12-10 ENCOUNTER — Ambulatory Visit: Payer: Self-pay | Admitting: Cardiology

## 2012-12-10 LAB — PROTIME-INR
INR: 2.6
Prothrombin Time: 27.4 secs — ABNORMAL HIGH (ref 11.5–14.7)

## 2013-01-12 ENCOUNTER — Ambulatory Visit: Payer: Self-pay | Admitting: Pain Medicine

## 2013-01-20 ENCOUNTER — Encounter (HOSPITAL_COMMUNITY): Payer: Self-pay | Admitting: Emergency Medicine

## 2013-01-20 ENCOUNTER — Emergency Department (HOSPITAL_COMMUNITY)
Admission: EM | Admit: 2013-01-20 | Discharge: 2013-01-20 | Disposition: A | Discharge status: Home / Self Care | Payer: No Typology Code available for payment source | Attending: Emergency Medicine | Admitting: Emergency Medicine

## 2013-01-20 ENCOUNTER — Emergency Department (HOSPITAL_COMMUNITY): Payer: No Typology Code available for payment source

## 2013-01-20 DIAGNOSIS — Z8739 Personal history of other diseases of the musculoskeletal system and connective tissue: Secondary | ICD-10-CM | POA: Insufficient documentation

## 2013-01-20 DIAGNOSIS — Z87891 Personal history of nicotine dependence: Secondary | ICD-10-CM | POA: Insufficient documentation

## 2013-01-20 DIAGNOSIS — I209 Angina pectoris, unspecified: Secondary | ICD-10-CM | POA: Insufficient documentation

## 2013-01-20 DIAGNOSIS — I10 Essential (primary) hypertension: Secondary | ICD-10-CM | POA: Insufficient documentation

## 2013-01-20 DIAGNOSIS — Z7901 Long term (current) use of anticoagulants: Secondary | ICD-10-CM | POA: Insufficient documentation

## 2013-01-20 DIAGNOSIS — F411 Generalized anxiety disorder: Secondary | ICD-10-CM | POA: Diagnosis not present

## 2013-01-20 DIAGNOSIS — S60229A Contusion of unspecified hand, initial encounter: Secondary | ICD-10-CM | POA: Insufficient documentation

## 2013-01-20 DIAGNOSIS — Z23 Encounter for immunization: Secondary | ICD-10-CM | POA: Insufficient documentation

## 2013-01-20 DIAGNOSIS — Z8673 Personal history of transient ischemic attack (TIA), and cerebral infarction without residual deficits: Secondary | ICD-10-CM | POA: Diagnosis not present

## 2013-01-20 DIAGNOSIS — S301XXA Contusion of abdominal wall, initial encounter: Secondary | ICD-10-CM | POA: Insufficient documentation

## 2013-01-20 DIAGNOSIS — E119 Type 2 diabetes mellitus without complications: Secondary | ICD-10-CM | POA: Insufficient documentation

## 2013-01-20 DIAGNOSIS — Y92410 Unspecified street and highway as the place of occurrence of the external cause: Secondary | ICD-10-CM | POA: Insufficient documentation

## 2013-01-20 DIAGNOSIS — I509 Heart failure, unspecified: Secondary | ICD-10-CM | POA: Insufficient documentation

## 2013-01-20 DIAGNOSIS — S8000XA Contusion of unspecified knee, initial encounter: Secondary | ICD-10-CM | POA: Insufficient documentation

## 2013-01-20 DIAGNOSIS — S60222A Contusion of left hand, initial encounter: Secondary | ICD-10-CM

## 2013-01-20 DIAGNOSIS — Y9389 Activity, other specified: Secondary | ICD-10-CM | POA: Insufficient documentation

## 2013-01-20 DIAGNOSIS — Z95810 Presence of automatic (implantable) cardiac defibrillator: Secondary | ICD-10-CM | POA: Insufficient documentation

## 2013-01-20 DIAGNOSIS — Z8719 Personal history of other diseases of the digestive system: Secondary | ICD-10-CM | POA: Diagnosis not present

## 2013-01-20 DIAGNOSIS — Z95818 Presence of other cardiac implants and grafts: Secondary | ICD-10-CM | POA: Insufficient documentation

## 2013-01-20 DIAGNOSIS — S8990XA Unspecified injury of unspecified lower leg, initial encounter: Secondary | ICD-10-CM | POA: Diagnosis present

## 2013-01-20 DIAGNOSIS — S8001XA Contusion of right knee, initial encounter: Secondary | ICD-10-CM

## 2013-01-20 DIAGNOSIS — Z79899 Other long term (current) drug therapy: Secondary | ICD-10-CM | POA: Diagnosis not present

## 2013-01-20 LAB — CBC WITH DIFFERENTIAL/PLATELET
Basophils Relative: 0 % (ref 0–1)
Eosinophils Absolute: 0 10*3/uL (ref 0.0–0.7)
HCT: 36.4 % (ref 36.0–46.0)
Hemoglobin: 12 g/dL (ref 12.0–15.0)
Lymphocytes Relative: 9 % — ABNORMAL LOW (ref 12–46)
Lymphs Abs: 1 10*3/uL (ref 0.7–4.0)
MCHC: 33 g/dL (ref 30.0–36.0)
Monocytes Relative: 1 % — ABNORMAL LOW (ref 3–12)
Neutro Abs: 9.9 10*3/uL — ABNORMAL HIGH (ref 1.7–7.7)
Neutrophils Relative %: 90 % — ABNORMAL HIGH (ref 43–77)
Platelets: 205 10*3/uL (ref 150–400)
RBC: 3.9 MIL/uL (ref 3.87–5.11)
WBC: 11 10*3/uL — ABNORMAL HIGH (ref 4.0–10.5)

## 2013-01-20 LAB — PROTIME-INR
INR: 1.17 (ref 0.00–1.49)
Prothrombin Time: 14.7 s (ref 11.6–15.2)

## 2013-01-20 LAB — BASIC METABOLIC PANEL
BUN: 12 mg/dL (ref 6–23)
CO2: 24 mEq/L (ref 19–32)
Chloride: 103 mEq/L (ref 96–112)
GFR calc Af Amer: >90 mL/min (ref >90)
GFR calc non Af Amer: 84 mL/min — ABNORMAL LOW (ref >90)
Potassium: 4.3 mEq/L (ref 3.5–5.1)
Sodium: 137 mEq/L (ref 135–145)

## 2013-01-20 MED ORDER — IOHEXOL 300 MG/ML  SOLN
100.0000 mL | Freq: Once | INTRAMUSCULAR | Status: AC | PRN
  Administered 2013-01-20: 100 mL via INTRAVENOUS

## 2013-01-20 MED ORDER — ACETAMINOPHEN 500 MG PO TABS
1000.0000 mg | ORAL_TABLET | Freq: Once | ORAL | Status: AC
  Administered 2013-01-20: 1000 mg via ORAL
  Filled 2013-01-20: qty 2

## 2013-01-20 MED ORDER — TETANUS-DIPHTH-ACELL PERTUSSIS 5-2.5-18.5 LF-MCG/0.5 IM SUSP
0.5000 mL | Freq: Once | INTRAMUSCULAR | Status: AC
  Administered 2013-01-20: 0.5 mL via INTRAMUSCULAR
  Filled 2013-01-20: qty 0.5
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
## 2013-02-15 ENCOUNTER — Ambulatory Visit: Payer: Self-pay | Admitting: Pain Medicine

## 2013-02-21 ENCOUNTER — Ambulatory Visit: Payer: Self-pay | Admitting: Pain Medicine

## 2013-03-17 ENCOUNTER — Ambulatory Visit: Payer: Self-pay | Admitting: Pain Medicine

## 2013-04-07 ENCOUNTER — Ambulatory Visit: Payer: Self-pay | Admitting: Specialist

## 2013-04-21 ENCOUNTER — Ambulatory Visit: Payer: Self-pay | Admitting: Pain Medicine

## 2013-04-27 ENCOUNTER — Ambulatory Visit: Payer: Self-pay | Admitting: Pain Medicine

## 2013-05-12 ENCOUNTER — Ambulatory Visit: Payer: Self-pay | Admitting: Pain Medicine

## 2013-05-23 ENCOUNTER — Ambulatory Visit: Payer: Self-pay | Admitting: Pain Medicine

## 2013-05-31 ENCOUNTER — Ambulatory Visit: Payer: Self-pay | Admitting: Ophthalmology

## 2013-05-31 DIAGNOSIS — I10 Essential (primary) hypertension: Secondary | ICD-10-CM

## 2013-05-31 DIAGNOSIS — Z01810 Encounter for preprocedural cardiovascular examination: Secondary | ICD-10-CM

## 2013-06-06 ENCOUNTER — Ambulatory Visit: Payer: Self-pay | Admitting: Ophthalmology

## 2013-06-14 ENCOUNTER — Ambulatory Visit: Payer: Self-pay | Admitting: Pain Medicine

## 2013-07-11 ENCOUNTER — Ambulatory Visit: Payer: Self-pay | Admitting: Ophthalmology

## 2013-11-28 ENCOUNTER — Ambulatory Visit: Payer: Self-pay | Admitting: Pain Medicine

## 2013-12-05 ENCOUNTER — Ambulatory Visit: Admit: 2013-12-05 | Disposition: A | Discharge status: Home / Self Care | Payer: Self-pay | Admitting: Pain Medicine

## 2013-12-12 ENCOUNTER — Ambulatory Visit: Payer: Self-pay | Admitting: Pain Medicine

## 2014-01-10 ENCOUNTER — Ambulatory Visit: Payer: Self-pay | Admitting: Pain Medicine

## 2014-02-01 DIAGNOSIS — I071 Rheumatic tricuspid insufficiency: Secondary | ICD-10-CM | POA: Insufficient documentation

## 2014-02-09 ENCOUNTER — Ambulatory Visit: Payer: Self-pay | Admitting: Pain Medicine

## 2014-02-09 DIAGNOSIS — G2581 Restless legs syndrome: Secondary | ICD-10-CM | POA: Diagnosis present

## 2014-02-15 ENCOUNTER — Ambulatory Visit: Payer: Self-pay | Admitting: Internal Medicine

## 2014-03-09 ENCOUNTER — Ambulatory Visit: Payer: Self-pay | Admitting: Pain Medicine

## 2014-04-13 ENCOUNTER — Ambulatory Visit: Payer: Self-pay | Admitting: Obstetrics & Gynecology

## 2014-04-13 ENCOUNTER — Ambulatory Visit: Payer: Self-pay | Admitting: Pain Medicine

## 2014-04-21 ENCOUNTER — Ambulatory Visit: Payer: Self-pay | Admitting: Obstetrics & Gynecology

## 2014-05-04 DIAGNOSIS — N3281 Overactive bladder: Secondary | ICD-10-CM | POA: Insufficient documentation

## 2014-05-09 ENCOUNTER — Ambulatory Visit
Admit: 2014-05-09 | Disposition: A | Discharge status: Home / Self Care | Payer: Self-pay | Attending: Pain Medicine | Admitting: Pain Medicine

## 2014-05-22 DIAGNOSIS — I10 Essential (primary) hypertension: Secondary | ICD-10-CM | POA: Insufficient documentation

## 2014-05-26 NOTE — Op Note (Signed)
PATIENT NAME:  [name], [name] MR#:  [id number] DATE OF BIRTH:  1941/01/08  DATE OF PROCEDURE:  12/10/2012  TITLE OF PROCEDURE NOTE: Implantation of a biventricular implantable cardiac defibrillator generator.   PREPROCEDURE DIAGNOSES: Chronic systolic heart failure, left bundle branch block, biventricular implantable cardiac defibrillator at elective replacement indicator.  POSTPROCEDURE DIAGNOSES: Chronic systolic heart failure, left bundle branch block, biventricular implantable cardiac defibrillator in situ.   DETAILS OF PROCEDURE: The patient was brought to the operating room in a fasting, nonsedated state. The patient was prepped and draped in the usual sterile manner. The area of the left infraclavicular fossa was anesthetized with local. Using a scalpel, an incision was made in the old incision site. Using a combination of electrocautery and blunt dissection, the ICD was subsequently extracted from the pocket. The leads were tested and showed no abnormalities, P waves 2.9 mV, threshold of 1.7 v at 0.5 ms and pacing impedance of 361. The RV lead showed there were no R waves noted. The patient is pacemaker dependent. Pacing threshold of 0.5 v at 0.5 ms and impedance of 342 ohms. The LV lead showed pacing threshold of 1.9 v at 0.5 ms and pacing impedance of 1678. The pocket was then irrigated with copious amounts of bacitracin. Adequate hemostasis was confirmed. The new generator was attached to the 3 leads and placed in the pocket. The pocket was then closed with running layers of 2-0 Vicryl and 3-0 Vicryl x 2 and the subcuticular layer was closed with 4-0 Vicryl. Steri-Strips were applied over the incision site as well as a sterile gauze and OpSite.   SUMMARY OF IMPLANTED HARDWARE: The patient received a Medtronic biventricular ICD, model Viva XT CRT-D, model number DTBA1D1. The serial number is [id number]. The patient has a 3 lead device. Lead 1 atrial lead is a Guidant model X2023907#4469, serial number  [id number], implanted April 03, 2004. The RV lead is a Production assistant, radiot. Jude medical model L409637#7121, serial number [id number] implanted February 11, 2007. The third lead is a Guidant easy track 2, model V5323734#4542, serial number [id number], implanted February 11, 2007.   FINAL PROGRAM PARAMETERS: Device was program mode DDD with a lower rate limit of 60, adaptive CRT is program on. Paced AV delay 130, sense AV delay 100, offset is 0 ms. Outputs for the atrial lead 3 v at 0.4 ms, RV lead 2.5.5 v at 0.4 ms and LV lead 2.5 v at 1 ms in LV tip to RV coil configuration. There were no complications noted at the conclusion of the procedure. Informed consent was obtained and placed in the patient's permanent medical record. No fluoroscopy was utilized.   COMPLICATIONS: There were no complications noted.    ____________________________ Anna GenreKevin L. Maisie Fushomas, MD klt:aw D: 12/10/2012 14:49:52 ET T: 12/10/2012 15:50:15 ET JOB#: [id number]  cc: Caryn BeeKevin L. Maisie Fushomas, MD, <Dictator> Sharion SettlerKEVIN L THOMAS MD ELECTRONICALLY SIGNED 01/12/2013 9:41

## 2014-05-27 NOTE — Op Note (Signed)
PATIENT NAME:  Heather LoftyERRY, Reda J MR#:  [id number] DATE OF BIRTH:  January 03, 1941  DATE OF PROCEDURE:  07/11/2013  PREOPERATIVE DIAGNOSIS: Cataract, left eye.   POSTOPERATIVE DIAGNOSIS: Cataract, left eye.  PROCEDURE PERFORMED: Extracapsular cataract extraction using phacoemulsification with placement of Alcon SN6CWS, 22.0-diopter posterior chamber lens, serial number [id number].   SURGEON: Maylon PeppersSteven A. Coyt Govoni, M.D.   ANESTHESIA: 4% lidocaine and 0.75% Marcaine in a 50-50 mixture with 10 units/mL of HyoMax added, given as a peribulbar.   ANESTHESIOLOGIST: Dr. Corky Downslsen.   COMPLICATIONS: None.   ESTIMATED BLOOD LOSS: Less than 1 mL.   DESCRIPTION OF PROCEDURE: The patient was brought to the operating room and given a peribulbar block.  The patient was then prepped and draped in the usual fashion.  The vertical rectus muscles were imbricated using 5-0 silk sutures.  These sutures were then clamped to the sterile drapes as bridle sutures.  A limbal peritomy was performed extending two clock hours and hemostasis was obtained with cautery.  A partial thickness scleral groove was made at the surgical limbus and dissected anteriorly in a lamellar dissection using an Alcon crescent knife.  The anterior chamber was entered supero-temporally with a Superblade and through the lamellar dissection with a 2.6 mm keratome.  DisCoVisc was used to replace the aqueous and a continuous tear capsulorrhexis was carried out.  Hydrodissection and hydrodelineation were carried out with balanced salt and a 27 gauge canula.  The nucleus was rotated to confirm the effectiveness of the hydrodissection.  Phacoemulsification was carried out using a divide-and-conquer technique.  Total ultrasound time was 1 minute and 42.8 seconds with an average power of 27.2%. CDE 45.44.  Irrigation/aspiration was used to remove the residual cortex.  DisCoVisc was used to inflate the capsule and the internal incision was enlarged to 3 mm with  the crescent knife.  The intraocular lens was folded and inserted into the capsular bag using the AcrySert delivery system.  Irrigation/aspiration was used to remove the residual DisCoVisc.  Miostat was injected into the anterior chamber through the paracentesis track to inflate the anterior chamber and induce miosis.  A tenth of a mL of cefuroxime was placed in the anterior chamber containing 1 mg of drug through the paracentesis. The wound was checked for leaks and none were found. The conjunctiva was closed with cautery and the bridle sutures were removed.  Two drops of 0.3% Vigamox were placed on the eye.   An eye shield was placed on the eye.  The patient was discharged to the recovery room in good condition.  ____________________________ Maylon PeppersSteven A. Ermon Sagan, MD sad:aw D: 07/11/2013 13:39:59 ET T: 07/11/2013 13:53:23 ET JOB#: [id number]  cc: Viviann SpareSteven A. Charlise Giovanetti, MD, <Dictator> Erline LevineSTEVEN A Staci Carver MD ELECTRONICALLY SIGNED 07/18/2013 13:46

## 2014-05-27 NOTE — Op Note (Signed)
PATIENT NAME:  [name], [name] MR#:  [id number] DATE OF BIRTH:  [birth year]  DATE OF PROCEDURE:  06/06/2013  PREOPERATIVE DIAGNOSIS:  Cataract, right eye.   POSTOPERATIVE DIAGNOSIS:  Cataract, right eye.  PROCEDURE PERFORMED:  Extracapsular cataract extraction using phacoemulsification with placement of an Alcon SN6CWS, 21.5-diopter posterior chamber lens, serial [id number].  SURGEON:  Maylon PeppersSteven A. Erza Mothershead, MD  ASSISTANT:  None.  ANESTHESIA:  4% lidocaine and 0.75% Marcaine in a 50/50 mixture with 10 units/mL of Hylenex added, given as a peribulbar.   ANESTHESIOLOGIST:  Dr. Henrene HawkingKephart.  COMPLICATIONS:  None.  ESTIMATED BLOOD LOSS:  Less than 1 ml.  DESCRIPTION OF PROCEDURE:  The patient was brought to the operating room and given a peribulbar block.  The patient was then prepped and draped in the usual fashion.  The vertical rectus muscles were imbricated using 5-0 silk sutures.  These sutures were then clamped to the sterile drapes as bridle sutures.  A limbal peritomy was performed extending two clock hours and hemostasis was obtained with cautery.  A partial thickness scleral groove was made at the surgical limbus and dissected anteriorly in a lamellar dissection using an Alcon crescent knife.  The anterior chamber was entered superonasally with a Superblade and through the lamellar dissection with a 2.6 mm keratome.  DisCoVisc was used to replace the aqueous and a continuous tear capsulorrhexis was carried out.  Hydrodissection and hydrodelineation were carried out with balanced salt and a 27 gauge canula.  The nucleus was rotated to confirm the effectiveness of the hydrodissection.  Phacoemulsification was carried out using a divide-and-conquer technique.  Total ultrasound time was 1 minute and 34.3 seconds with an average power of 23.4 percent and CDE of 39.81.  Irrigation/aspiration was used to remove the residual cortex.  DisCoVisc was used to inflate the capsule and the internal  incision was enlarged to 3 mm with the crescent knife.  The intraocular lens was folded and inserted into the capsular bag using the AcrySert delivery system. Irrigation/aspiration was used to remove the residual DisCoVisc.  Miostat was injected into the anterior chamber through the paracentesis track to inflate the anterior chamber and induce miosis. A tenth of a milliliter of cefuroxime was injected via the paracentesis tract containing 1 mg of drug. The wound was checked for leaks and none were found. The conjunctiva was closed with cautery and the bridle sutures were removed.  Two drops of 0.3% Vigamox were placed on the eye.   An eye shield was placed on the eye.  The patient was discharged to the recovery room in good condition.  ____________________________ Maylon PeppersSteven A. Malea Swilling, MD sad:sb D: 06/06/2013 12:51:25 ET T: 06/06/2013 13:08:22 ET JOB#: [id number]  cc: Viviann SpareSteven A. Auria Mckinlay, MD, <Dictator> Erline LevineSTEVEN A Ashaunti Treptow MD ELECTRONICALLY SIGNED 06/13/2013 11:38

## 2014-05-29 LAB — SURGICAL PATHOLOGY

## 2014-06-04 NOTE — Op Note (Signed)
PATIENT NAME:  Heather LoftyERRY, [name] MR#:  [id number] DATE OF BIRTH:  05-05-1940  DATE OF PROCEDURE:  04/21/2014  PREOPERATIVE DIAGNOSIS:  Postmenopausal bleeding.  POSTOPERATIVE DIAGNOSIS:  Postmenopausal bleeding.   PROCEDURE:  Dilation and curettage.  SURGEON:  Dr. Leeroy Bockhelsea Ward.  ANESTHESIA: General.   ESTIMATED BLOOD LOSS: Minimal.   OPERATIVE FLUIDS:  400 mL.   FINDINGS:  Scant endometrial tissue. Good cry in 4 quadrants.  A small and atrophic cervix and uterus.   SPECIMENS: Endometrial curettings.   COMPLICATIONS: None apparent.   DISPOSITION: Stable to PACU.   INDICATION FOR PROCEDURE: The patient had presented in the office with complaints of postmenopausal bleeding. She was evaluated by ultrasound and found to have a very small and thin endometrial stripe.  An in-office endometrial biopsy was attempted, but the patient did not tolerate the procedure or the manipulation of the vaginal tissues. It was then thought that she should come back to the operating room for a D and C under anesthesia.  Consents were signed.   OPERATIVE NOTE:  The patient was brought back to the operating room where she was identified as [name]. She was given anesthesia through a general endotracheal route, placed in the dorsal lithotomy position, and prepped and draped in the usual sterile fashion. A surgical timeout was called. A Graves speculum was placed into the vagina to visualize the cervix which was small and very distal to the vaginal orifice.  It was grasped with a single-tooth tenaculum and brought forward.  Pratt dilators were then used to sequentially dilate the cervix. The uterus was then sounded to 6 cm and a small sharp curettage was placed into the uterus and curettage was performed.  There was a scant amount of tissue returned, and then the procedure was considered complete. All instruments were removed from the patient. She was recovered and brought to PACU.  Counts were correct x 2.     ____________________________ Elenora Fenderhelsea C. Ward, MD ccw:sp D: 04/23/2014 13:00:25 ET T: 04/23/2014 13:42:07 ET JOB#: [id number]  cc: Chelsea C. Ward, MD, <Dictator> Leola BrazilHELSEA C WARD MD ELECTRONICALLY SIGNED 05/25/2014 23:52

## 2014-06-08 ENCOUNTER — Encounter: Payer: Self-pay | Admitting: Pain Medicine

## 2014-06-08 ENCOUNTER — Encounter (INDEPENDENT_AMBULATORY_CARE_PROVIDER_SITE_OTHER): Payer: Self-pay

## 2014-06-08 ENCOUNTER — Ambulatory Visit: Payer: Medicare Other | Attending: Pain Medicine | Admitting: Pain Medicine

## 2014-06-08 VITALS — BP 131/61 | HR 77 | Temp 98.5°F | Resp 16 | Ht 64.0 in | Wt 238.0 lb

## 2014-06-08 DIAGNOSIS — G588 Other specified mononeuropathies: Secondary | ICD-10-CM

## 2014-06-08 DIAGNOSIS — M549 Dorsalgia, unspecified: Secondary | ICD-10-CM | POA: Diagnosis present

## 2014-06-08 DIAGNOSIS — M5030 Other cervical disc degeneration, unspecified cervical region: Secondary | ICD-10-CM | POA: Diagnosis not present

## 2014-06-08 DIAGNOSIS — M5134 Other intervertebral disc degeneration, thoracic region: Secondary | ICD-10-CM

## 2014-06-08 DIAGNOSIS — M47898 Other spondylosis, sacral and sacrococcygeal region: Secondary | ICD-10-CM | POA: Diagnosis not present

## 2014-06-08 DIAGNOSIS — M5136 Other intervertebral disc degeneration, lumbar region: Secondary | ICD-10-CM | POA: Diagnosis not present

## 2014-06-08 MED ORDER — HYDROCODONE-ACETAMINOPHEN 5-325 MG PO TABS: ORAL_TABLET | ORAL | Status: DC

## 2014-06-08 NOTE — Patient Instructions (Signed)
Sacroiliac (SI) Joint Injection Patient Information  Description: The sacroiliac joint connects the scrum (very low back and tailbone) to the ilium (a pelvic bone which also forms half of the hip joint).  Normally this joint experiences very little motion.  When this joint becomes inflamed or unstable low back and or hip and pelvis pain may result.  Injection of this joint with local anesthetics (numbing medicines) and steroids can provide diagnostic information and reduce pain.  This injection is performed with the aid of x-ray guidance into the tailbone area while you are lying on your stomach.   You may experience an electrical sensation down the leg while this is being done.  You may also experience numbness.  We also may ask if we are reproducing your normal pain during the injection.  Conditions which may be treated SI injection:   Low back, buttock, hip or leg pain  Preparation for the Injection:  1. Do not eat any solid food or dairy products within 6 hours of your appointment.  2. You may drink clear liquids up to 2 hours before appointment.  Clear liquids include water, black coffee, juice or soda.  No milk or cream please. 3. You may take your regular medications, including pain medications with a sip of water before your appointment.  Diabetics should hold regular insulin (if take separately) and take 1/2 normal NPH dose the morning of the procedure.  Carry some sugar containing items with you to your appointment. 4. A driver must accompany you and be prepared to drive you home after your procedure. 5. Bring all of your current medications with you. 6. An IV may be inserted and sedation may be given at the discretion of the physician. 7. A blood pressure cuff, EKG and other monitors will often be applied during the procedure.  Some patients may need to have extra oxygen administered for a short period.  8. You will be asked to provide medical information, including your allergies,  prior to the procedure.  We must know immediately if you are taking blood thinners (like Coumadin/Warfarin) or if you are allergic to IV iodine contrast (dye).  We must know if you could possible be pregnant.  Possible side effects:   Bleeding from needle site  Infection (rare, may require surgery)  Nerve injury (rare)  Numbness & tingling (temporary)  A brief convulsion or seizure  Light-headedness (temporary)  Pain at injection site (several days)  Decreased blood pressure (temporary)  Weakness in the leg (temporary)   Call if you experience:   New onset weakness or numbness of an extremity below the injection site that last more than 8 hours.  Hives or difficulty breathing ( go to the emergency room)  Inflammation or drainage at the injection site  Any new symptoms which are concerning to you  Please note:  Although the local anesthetic injected can often make your back/ hip/ buttock/ leg feel good for several hours after the injections, the pain will likely return.  It takes 3-7 days for steroids to work in the sacroiliac area.  You may not notice any pain relief for at least that one week.  If effective, we will often do a series of three injections spaced 3-6 weeks apart to maximally decrease your pain.  After the initial series, we generally will wait some months before a repeat injection of the same type.  If you have any questions, please call [phone] [facility]   

## 2014-06-08 NOTE — Progress Notes (Signed)
Teach back 3 done.  Script given for Hydrocodone.  Discharged at 1034, ambulatory.

## 2014-06-08 NOTE — Progress Notes (Signed)
   Subjective:    Patient ID: [name], female    DOB: 09/26/1940, 74 y.o.   MRN: [id number]  HPI  PATIENT STATES THE BACK PAIN AND PAIN ACROSS BUTTOCKS AND HIPS IS OF SEVERE DEGREE  PT DENIES TRAUMA  PAIN KEEPS PT AWAKE AS WELL AS INTERFERES WITH ACTIVITIES OF DAILY LIVING TO SIGNIFICANT DEGREE.  Review of Systems     Objective:   Physical Exam  Constitutional: She is oriented to person, place, and time. She appears well-developed and well-nourished.  HENT:  Head: Normocephalic.  Neck: Neck supple.  Pulmonary/Chest: Breath sounds normal.  Abdominal: She exhibits no distension and no mass. There is no tenderness. There is no rebound and no guarding.  Musculoskeletal: She exhibits tenderness. She exhibits no edema.  Neurological: She is alert and oriented to person, place, and time.  SEVERE TENDERNESS TO PALPATION OF PSIS AND PIIS REGIONS  TENDERNESS TO PALPATION OF THE LUMBAR PARASPINAL REGIONS OF MODERATED DEGREE   SLR TOLERATED TO 20 DEGREES WITHOUT INCREASE OF PAIN WITH DORSIFLEXION       Assessment & Plan:  DDD  CERVICAL  LUMBAR DJD  SACROILIAC JOINT SACROILIAC JOINT DYSFUNCTION     CONTINUE OXYCODONE F/U DR Dan HumphreysWALKER AS DISCUSSED SURGICAL EVAL AND NEUROLOGICAL EVAL  TO BE CONSIDERED  SIJI 8 AM ON 06-19-14

## 2014-06-15 NOTE — Addendum Note (Signed)
Addended by: Valerie SaltsICE, Anyely Cunning M on: 06/15/2014 11:10 AM   Modules accepted: Orders

## 2014-06-18 ENCOUNTER — Encounter: Payer: Self-pay | Admitting: Pain Medicine

## 2014-06-18 ENCOUNTER — Other Ambulatory Visit: Payer: Self-pay | Admitting: Pain Medicine

## 2014-06-18 DIAGNOSIS — M5136 Other intervertebral disc degeneration, lumbar region: Secondary | ICD-10-CM | POA: Insufficient documentation

## 2014-06-18 DIAGNOSIS — M19019 Primary osteoarthritis, unspecified shoulder: Secondary | ICD-10-CM | POA: Insufficient documentation

## 2014-06-18 DIAGNOSIS — M19011 Primary osteoarthritis, right shoulder: Secondary | ICD-10-CM

## 2014-06-18 DIAGNOSIS — M47817 Spondylosis without myelopathy or radiculopathy, lumbosacral region: Secondary | ICD-10-CM

## 2014-06-18 DIAGNOSIS — M47816 Spondylosis without myelopathy or radiculopathy, lumbar region: Secondary | ICD-10-CM | POA: Insufficient documentation

## 2014-06-18 DIAGNOSIS — M533 Sacrococcygeal disorders, not elsewhere classified: Secondary | ICD-10-CM | POA: Insufficient documentation

## 2014-06-18 DIAGNOSIS — M19012 Primary osteoarthritis, left shoulder: Secondary | ICD-10-CM

## 2014-06-18 DIAGNOSIS — M51369 Other intervertebral disc degeneration, lumbar region without mention of lumbar back pain or lower extremity pain: Secondary | ICD-10-CM | POA: Insufficient documentation

## 2014-06-18 DIAGNOSIS — G5700 Lesion of sciatic nerve, unspecified lower limb: Secondary | ICD-10-CM | POA: Insufficient documentation

## 2014-06-19 ENCOUNTER — Ambulatory Visit: Payer: Medicare Other | Attending: Pain Medicine | Admitting: Pain Medicine

## 2014-06-19 ENCOUNTER — Encounter: Payer: Self-pay | Admitting: Pain Medicine

## 2014-06-19 VITALS — BP 110/49 | HR 59 | Temp 98.7°F | Resp 16

## 2014-06-19 DIAGNOSIS — M79605 Pain in left leg: Secondary | ICD-10-CM | POA: Insufficient documentation

## 2014-06-19 DIAGNOSIS — M47816 Spondylosis without myelopathy or radiculopathy, lumbar region: Secondary | ICD-10-CM

## 2014-06-19 DIAGNOSIS — M5136 Other intervertebral disc degeneration, lumbar region: Secondary | ICD-10-CM | POA: Insufficient documentation

## 2014-06-19 DIAGNOSIS — M545 Low back pain: Secondary | ICD-10-CM | POA: Diagnosis present

## 2014-06-19 DIAGNOSIS — M79604 Pain in right leg: Secondary | ICD-10-CM | POA: Insufficient documentation

## 2014-06-19 DIAGNOSIS — M533 Sacrococcygeal disorders, not elsewhere classified: Secondary | ICD-10-CM

## 2014-06-19 DIAGNOSIS — M51369 Other intervertebral disc degeneration, lumbar region without mention of lumbar back pain or lower extremity pain: Secondary | ICD-10-CM

## 2014-06-19 MED ORDER — MIDAZOLAM HCL 5 MG/5ML IJ SOLN
INTRAMUSCULAR | Status: AC
  Administered 2014-06-19: 3 mg via INTRAVENOUS
  Filled 2014-06-19: qty 5

## 2014-06-19 MED ORDER — ORPHENADRINE CITRATE 30 MG/ML IJ SOLN
INTRAMUSCULAR | Status: AC
  Administered 2014-06-19: 60 mg
  Filled 2014-06-19: qty 2

## 2014-06-19 MED ORDER — BUPIVACAINE HCL (PF) 0.25 % IJ SOLN
INTRAMUSCULAR | Status: AC
  Administered 2014-06-19: 14:00:00
  Filled 2014-06-19: qty 30

## 2014-06-19 MED ORDER — FENTANYL CITRATE (PF) 100 MCG/2ML IJ SOLN
INTRAMUSCULAR | Status: AC
  Administered 2014-06-19: 50 ug via INTRAVENOUS
  Filled 2014-06-19: qty 2

## 2014-06-19 MED ORDER — TRIAMCINOLONE ACETONIDE 40 MG/ML IJ SUSP
INTRAMUSCULAR | Status: AC
  Administered 2014-06-19: 10 mg
  Filled 2014-06-19: qty 1

## 2014-06-19 NOTE — Patient Instructions (Addendum)
Continue present medications.  F/U PCP for evaliation of  BP and general medical  condition.  F/U surgical evaluation.  F/U nrurological evaluation.  May consider radiofrequency rhizolysis or intraspinal procedures pending response to present treatment and F/U evaluation.  Patient to call Pain Management Center should patient have concerns prior to scheduled return appointment.   Pain Management Discharge Instructions  General Discharge Instructions :  If you need to reach your doctor call: Monday-Friday 8:00 am - 4:00 pm at [phone] or toll free [phone].  After clinic hours [phone] to have operator reach doctor.  Bring all of your medication bottles to all your appointments in the pain clinic.  To cancel or reschedule your appointment with Pain Management please remember to call 24 hours in advance to avoid a fee.  Refer to the educational materials which you have been given on: General Risks, I had my Procedure. Discharge Instructions, Post Sedation.  Post Procedure Instructions:  The drugs you were given will stay in your system until tomorrow, so for the next 24 hours you should not drive, make any legal decisions or drink any alcoholic beverages.  You may eat anything you prefer, but it is better to start with liquids then soups and crackers, and gradually work up to solid foods.  Please notify your doctor immediately if you have any unusual bleeding, trouble breathing or pain that is not related to your normal pain.  Depending on the type of procedure that was done, some parts of your body may feel week and/or numb.  This usually clears up by tonight or the next day.  Walk with the use of an assistive device or accompanied by an adult for the 24 hours.  You may use ice on the affected area for the first 24 hours.  Put ice in a Ziploc bag and cover with a towel and place against area 15 minutes on 15 minutes off.  You may switch to heat after 24 hours. 

## 2014-06-19 NOTE — Progress Notes (Signed)
   Subjective:    Patient ID: Heather LoftySandra J Mohabir, female    DOB: 02/28/1940, 74 y.o.   MRN: [id number]  HPI    Review of Systems     Objective:   Physical Exam        Assessment & Plan:

## 2014-06-19 NOTE — Progress Notes (Signed)
Procedure start time 1413 Procedure end time 1419 Fluoro time 1.9 Report given to Belinda Block. Kennedy.

## 2014-06-19 NOTE — Progress Notes (Signed)
Discharged via w/c at 2:55 pm. Tolerating po fluids well. Instructions (verbal and written) given to patient. Teachback x3.

## 2014-06-19 NOTE — Progress Notes (Signed)
.  NOTE:  The patient is a 74 y.o. female who returns to the Pain Management Center for further evaluation and treatment of pain involving the lumbar and lower extremity region with pain involving the region of the hip and buttocks as well.  Prior studies reveal patient to be with evidence of MRI multilevel degenerative changes of the lumbar spine the patient has severe tenderness of the PSIS and PIIS regions and positive Patrick's maneuver .  There is concern regarding patient's pain being due to significant component of sacroiliitis, sacroiliac joint dysfunction .  We have discussed patient's condition.  The risks, benefits, and expectations of the procedure have been discussed and explained to the patient who is understanding and wishes to proceed with interventional treatment as planned.    PROCEDURE: Sacroiliac joint on the left side injection with IV Versed, IV Fentanyl conscious sedation, EKG, blood pressure, pulse, pulse oximetry monitoring.  Procedure was performed with patient in prone position under fluoroscopic guidance.    Inferior pole sacroiliac joint injection on the left side.  With patient in prone position and Betadine prep of proposed entry site of accomplished, a 22  -gauge needle was inserted at the inferior pole of the sacroiliac joint on the left under fluoroscopic guidance.  Following documentation of needle placement at the inferior pole of the sacroiliac joint on the left, a total of 1cc 0.25% bupivacaine with 1 was injected for left inferior pole sacroiliac joint injection.  Needle was removed.    Superior pole sacroiliac joint injection on the left side.  With patient in prone position and Betadine prep of proposed entry site of accomplished, the to-gauge needle was inserted at the superior pole of the sacroiliac joint on the left under fluoroscopic guidance.  Following documentation of needle placement at the inferior pole of the sacroiliac joint on the left, a total of 1cc 0.25%  bupivacaine with 1 was injected for left superior pole sacroiliac joint injection.  Needle was removed.    Right sacroiliac joint injection (inferior pole and superior pole of the right sacroiliac joint were injected as performed for the left sacroiliac joint.  Patient tolerated procedure well.  A total of 10  mg Kenalog was utilized for the entire procedure.  PLAN:    1. Medications: Will continue presently prescribed medications. 2. Patient to follow up with primary care physician, Dr. Yates DecampJohn Walker III, for evaluation of blood pressure and general medical condition status post sacroiliac joint injection performed on today's visit. 3. Surgical evaluation is discussed. 4. Neurological evaluation with PNCV/EMG studies as discussed. 5. Patient may be candidate for radiofrequency procedures, Botox injections, implantation-type procedures, and other treatment pending response to treatment and follow-up evaluation. 6. Patient has been advised to adhere to proper body mechanics and to call Pain Management Center prior to scheduled return appointment should there be significant change in condition or patient have other concerns regarding condition prior to scheduled return appointment.   Patient with understanding and in agreement with suggested treatment plan.

## 2014-06-20 ENCOUNTER — Telehealth: Payer: Self-pay | Admitting: *Deleted

## 2014-06-20 NOTE — Telephone Encounter (Signed)
States feel ing much better. No problems.

## 2014-06-28 ENCOUNTER — Other Ambulatory Visit: Payer: Self-pay | Admitting: Pain Medicine

## 2014-07-05 ENCOUNTER — Ambulatory Visit: Payer: Medicare Other | Attending: Neurology | Admitting: Physical Therapy

## 2014-07-05 ENCOUNTER — Encounter: Payer: Self-pay | Admitting: Physical Therapy

## 2014-07-05 VITALS — BP 125/80

## 2014-07-05 DIAGNOSIS — M5481 Occipital neuralgia: Secondary | ICD-10-CM | POA: Diagnosis not present

## 2014-07-05 DIAGNOSIS — M461 Sacroiliitis, not elsewhere classified: Secondary | ICD-10-CM | POA: Insufficient documentation

## 2014-07-05 DIAGNOSIS — M545 Low back pain: Secondary | ICD-10-CM | POA: Insufficient documentation

## 2014-07-05 DIAGNOSIS — R269 Unspecified abnormalities of gait and mobility: Secondary | ICD-10-CM | POA: Diagnosis not present

## 2014-07-05 DIAGNOSIS — M5030 Other cervical disc degeneration, unspecified cervical region: Secondary | ICD-10-CM | POA: Insufficient documentation

## 2014-07-05 DIAGNOSIS — R531 Weakness: Secondary | ICD-10-CM

## 2014-07-05 DIAGNOSIS — M19019 Primary osteoarthritis, unspecified shoulder: Secondary | ICD-10-CM | POA: Diagnosis not present

## 2014-07-05 DIAGNOSIS — M5136 Other intervertebral disc degeneration, lumbar region: Secondary | ICD-10-CM | POA: Diagnosis not present

## 2014-07-06 NOTE — Therapy (Signed)
[facility] MAIN [facility] SERVICES [address] [place], [place], [address] Phone: [phone]   Fax:  [phone]  Physical Therapy Evaluation  Patient Details  Name: [name] MRN: [id number] Date of Birth: [birth year] Referring Provider:  Morene Crocker, MD  Encounter Date: 07/05/2014      PT End of Session - 07/06/14 0836    Visit Number 1   Number of Visits 12   Date for PT Re-Evaluation 09/27/14   Authorization Type G code-1/10   PT Start Time 1320   PT Stop Time 1412   PT Time Calculation (min) 52 min   Activity Tolerance Patient tolerated treatment well   Behavior During Therapy [facility] for tasks assessed/performed      Past Medical History  Diagnosis Date  . Dysrhythmia     A-FIB  . AICD (automatic cardioverter/defibrillator) present     PLACED BY  DR  Graciela Husbands 2008  . Hypertension   . Angina     5-6 YRS AGO  . CHF (congestive heart failure)   . Shortness of breath     EASING OFF NOW  . Diabetes mellitus   . Headache(784.0)   . Fibromyalgia   . Arthritis   . Anxiety   . Sleep apnea     DOESN'T KNOW SETTINGS  . History of transient ischemic attack (TIA)     LAST ONE IN  2005  IN  [place], [place]  . Diverticulum of esophagus     large diverticulum in the middle third of the esophagus [facility] EGD '09)  . Complication of anesthesia     states at times had a tough time waking up  . TIA (transient ischemic attack)     Past Surgical History  Procedure Laterality Date  . Cesarean section      X  4  . Insert / replace / remove pacemaker      ST  JUDE  DEVICE  . Cardiac catheterization      X 2    LAST ONE  2009  . Tonsillectomy    . Appendectomy    . Tubal ligation    . Bilateral oophorectomy      BENIGN  . Anterior cervical decomp/discectomy fusion  04/09/2011    Procedure: ANTERIOR CERVICAL DECOMPRESSION/DISCECTOMY FUSION 1 LEVEL;  Surgeon: Temple Pacini, MD;  Location: [facility] OR[facility];  Service: Neurosurgery;  Laterality:  N/A;  Cervical five-six Anterior Cervical Decompression and Fusion  . Cholecystectomy    . Joint replacement      BIL  KNEES    Filed Vitals:   07/05/14 1406  BP: 125/80    Visit Diagnosis:  Weakness - Plan: PT plan of care cert/re-cert  Abnormality of gait - Plan: PT plan of care cert/re-cert      Subjective Assessment - 07/05/14 1333    Subjective Pt reported she has had 3 falls within the past 4 months. Each fall occurred in her house while loss of balance, tripping over dog, and getting out of a low chair w/ arm rests. For the one fall incident, she was able to pick herself up after 4 hours but she required EMT services for assistance for the reamining falls incidents. Pt was able to scoot on her bottom to get to the bed.  Pt incurred a hairline fx in her R pinky toe  from stubbing on toe which led to her last fall incident.    Limitations Walking;House hold activities;Other (comment)  leaves her  house 2-4x during the week to community events and MD appts. Pt only shops at stores with scooters.    Patient Stated Goals improve balance    Currently in Pain? Yes   Pain Score 7    Pain Location Knee   Pain Orientation Right;Left   Pain Descriptors / Indicators Aching   Pain Type Chronic pain   Pain Frequency Intermittent   Aggravating Factors  after sitting for long periods    Multiple Pain Sites Yes   Pain Score 7   Pain Location Back      Pain Orientation Lower   Pain Descriptors / Indicators Aching   Pain Type Chronic pain   Pain Frequency Intermittent   Pain Relieving Factors Pt had received SIJ injections 21month ago which relieved pain for 3 days with improve walking, stand tall.    Effect of Pain on Daily Activities shopping             [facility] PT Assessment - 07/06/14 0845    Assessment   Medical Diagnosis balance   Precautions   Precautions None   Restrictions   Weight Bearing Restrictions No   Home Environment   Living Environment Private residence    Living Arrangements Alone  son lives 6 miles away and helps pt with medications.   Type of Home House   Home Access Other (comment)  zero STE   Home Layout One level   Prior Function   Level of Independence Independent   Observation/Other Assessments   Other Surveys  --  ABC 33.57% (higher % indicate higher function).    Sit to Stand   Comments 5TST  31.40 sec with arm rests   AROM   Overall AROM  Deficits   Strength   Overall Strength Deficits   Overall Strength Comments bilat. LE hip flex, ER, IR  3/5. Bilat knee flex, DF, toe DF 4/5   Ambulation/Gait   Ambulation/Gait Yes   Ambulation Distance (Feet) 10 Feet   Assistive device Straight cane   Gait velocity 0.62 m/s (15.96 sec 10 m)    Gait Comments SPC appeared too short, gait improved after PT corrected height of SPC  Hip ER on R, decreased stance on R   Balance   Balance Assessed Yes   High Level Balance   High Level Balance Activities Other (comment)  TUG 21.28 sec chair w/ arm rest and SPC                            PT Education - 07/06/14 0832    Education provided Yes   Education Details HEP   Person(s) Educated Patient   Methods Explanation;Demonstration;Verbal cues   Comprehension Verbalized understanding;Returned demonstration             PT Long Term Goals - 07/06/14 0847    PT LONG TERM GOAL #1   Title Pt will demo increased gait speed from 0.62 m/s to >1.3 m/s in order to ambulate safely across parking lots when shopping at [employer].   Time 12   Period Weeks   Status New   PT LONG TERM GOAL #2   Title Pt will score an increased score from 33.75% on ABC questionnaire to > 50% in order to demo increased confidence with balance.    Time 12   Period Weeks   Status New   PT LONG TERM GOAL #3   Title Pt will be able to demo decreased time  w/ TUG from 21.28 sec to 18.28 sec while using SPC and a chair without armrests.   Time 12   Period Weeks   Status New                Plan - July 23, 2014 0837    Clinical Impression Statement Pt is a 74 yo female who reports frequent  falls and has difficulty w/ floor to rise t/f. Pt depends on a SPC for gait, and has complex co-morbidities which are associated with low endurance, slow gait speed, and increased risk for falls.     Pt will benefit from skilled therapeutic intervention in order to improve on the following deficits Abnormal gait;Cardiopulmonary status limiting activity;Decreased endurance;Decreased coordination;Difficulty walking;Decreased range of motion;Decreased safety awareness;Decreased activity tolerance;Decreased balance;Decreased mobility;Decreased strength;Postural dysfunction;Improper body mechanics;Impaired flexibility;Obesity   Rehab Potential Good   PT Frequency 2x / week   PT Duration 12 weeks   PT Treatment/Interventions ADLs/Self Care Home Management;Aquatic Therapy;Electrical Stimulation;Biofeedback;Gait training;Neuromuscular re-education;Balance training;Therapeutic exercise;Therapeutic activities;Functional mobility training;Moist Heat;Traction;Patient/family education;Dry needling;Manual techniques;Energy conservation   Consulted and Agree with Plan of Care Patient          G-Codes - 07-23-14 0855    Functional Assessment Tool Used ABC and gait speed   Functional Limitation Mobility: Walking and moving around   Mobility: Walking and Moving Around Current Status [phone]) At least 60 percent but less than 80 percent impaired, limited or restricted   Mobility: Walking and Moving Around Goal Status [phone]) At least 40 percent but less than 60 percent impaired, limited or restricted       Problem List Patient Active Problem List   Diagnosis Date Noted  . 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 Piriformis syndrome 06/18/2014  . Facet syndrome, lumbar 06/18/2014  . 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 DDD (degenerative disc disease), lumbar 06/18/2014  . DDD (degenerative disc disease), cervical  06/08/2014  . 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 Cervical spinal stenosis 04/09/2011    Mariane Masters ,PT, DPT, E-RYT  07-23-2014, 9:02 AM  [facility] MAIN [facility] SERVICES [address] [place], [place], [address] Phone: [phone]   Fax:  [phone]

## 2014-07-06 NOTE — Patient Instructions (Signed)
Sit to Stand: Nose over toes, inhale. Exhale with standing up.

## 2014-07-09 ENCOUNTER — Other Ambulatory Visit: Payer: Self-pay | Admitting: Pain Medicine

## 2014-07-09 DIAGNOSIS — G5700 Lesion of sciatic nerve, unspecified lower limb: Secondary | ICD-10-CM

## 2014-07-09 DIAGNOSIS — M51369 Other intervertebral disc degeneration, lumbar region without mention of lumbar back pain or lower extremity pain: Secondary | ICD-10-CM

## 2014-07-09 DIAGNOSIS — G588 Other specified mononeuropathies: Secondary | ICD-10-CM

## 2014-07-09 DIAGNOSIS — M533 Sacrococcygeal disorders, not elsewhere classified: Secondary | ICD-10-CM

## 2014-07-09 DIAGNOSIS — M5030 Other cervical disc degeneration, unspecified cervical region: Secondary | ICD-10-CM

## 2014-07-09 DIAGNOSIS — M19012 Primary osteoarthritis, left shoulder: Secondary | ICD-10-CM

## 2014-07-09 DIAGNOSIS — M4802 Spinal stenosis, cervical region: Secondary | ICD-10-CM

## 2014-07-09 DIAGNOSIS — M19011 Primary osteoarthritis, right shoulder: Secondary | ICD-10-CM

## 2014-07-09 DIAGNOSIS — M47816 Spondylosis without myelopathy or radiculopathy, lumbar region: Secondary | ICD-10-CM

## 2014-07-09 DIAGNOSIS — M5134 Other intervertebral disc degeneration, thoracic region: Secondary | ICD-10-CM

## 2014-07-09 DIAGNOSIS — M5136 Other intervertebral disc degeneration, lumbar region: Secondary | ICD-10-CM

## 2014-07-10 DIAGNOSIS — R413 Other amnesia: Secondary | ICD-10-CM | POA: Insufficient documentation

## 2014-07-11 ENCOUNTER — Ambulatory Visit: Payer: Medicare Other | Attending: Neurology | Admitting: Physical Therapy

## 2014-07-11 ENCOUNTER — Encounter: Payer: Self-pay | Admitting: Physical Therapy

## 2014-07-11 DIAGNOSIS — R531 Weakness: Secondary | ICD-10-CM

## 2014-07-11 DIAGNOSIS — R269 Unspecified abnormalities of gait and mobility: Secondary | ICD-10-CM | POA: Diagnosis not present

## 2014-07-11 DIAGNOSIS — R2681 Unsteadiness on feet: Secondary | ICD-10-CM | POA: Insufficient documentation

## 2014-07-11 NOTE — Therapy (Signed)
[facility] MAIN [facility] SERVICES [address] [place], [place], [address] Phone: [phone]   Fax:  [phone]  Physical Therapy Treatment  Patient Details  Name: [name] MRN: [id number] Date of Birth: [birth year] Referring Provider:  Morene Crocker, MD  Encounter Date: 07/11/2014      PT End of Session - 07/11/14 1514    Visit Number 2   Number of Visits 12   Date for PT Re-Evaluation 09/27/14   PT Start Time 0305   PT Stop Time 0350   PT Time Calculation (min) 45 min   Activity Tolerance Patient tolerated treatment well   Behavior During Therapy [facility] for tasks assessed/performed      Past Medical History  Diagnosis Date  . Dysrhythmia     A-FIB  . AICD (automatic cardioverter/defibrillator) present     PLACED BY  DR  Graciela Husbands 2008  . Hypertension   . Angina     5-6 YRS AGO  . CHF (congestive heart failure)   . Shortness of breath     EASING OFF NOW  . Diabetes mellitus   . Headache(784.0)   . Fibromyalgia   . Arthritis   . Anxiety   . Sleep apnea     DOESN'T KNOW SETTINGS  . History of transient ischemic attack (TIA)     LAST ONE IN  2005  IN  [place], [place]  . Diverticulum of esophagus     large diverticulum in the middle third of the esophagus [facility] EGD '09)  . Complication of anesthesia     states at times had a tough time waking up  . TIA (transient ischemic attack)     Past Surgical History  Procedure Laterality Date  . Cesarean section      X  4  . Insert / replace / remove pacemaker      ST  JUDE  DEVICE  . Cardiac catheterization      X 2    LAST ONE  2009  . Tonsillectomy    . Appendectomy    . Tubal ligation    . Bilateral oophorectomy      BENIGN  . Anterior cervical decomp/discectomy fusion  04/09/2011    Procedure: ANTERIOR CERVICAL DECOMPRESSION/DISCECTOMY FUSION 1 LEVEL;  Surgeon: Temple Pacini, MD;  Location: [facility];  Service: Neurosurgery;  Laterality: N/A;  Cervical five-six Anterior  Cervical Decompression and Fusion  . Cholecystectomy    . Joint replacement      BIL  KNEES    There were no vitals filed for this visit.  Visit Diagnosis:  Weakness  Abnormality of gait      Subjective Assessment - 07/11/14 1512    Subjective Patient continues to have difficulty with geting up from a chair and has decreased standing balance . She will continue to benefit from skilled PT to improve strength and balance.      Therapeutic exercise; Including standing on blue foam with therap ball yellow for horizontal ball with elbows straight and diagonals with yellow theraball x 10 x 2 sets Step ups from blue foam x 20 Tapping from blue foam to stool x 20  Supine hooklying position with TA and abd/ER  with GTB x 20 x 2 Supine hooklying position with TA and marching x 20 supien hooklying position with TA and heel slides x 20 Leg press x 20 x 2  Pt demonstrated decreased endurance with sit-to-stands exhibiting fatigue towards the end of second set  with increased breathing rate Cueing was needed during the leg press to control motion out/back; frequent instances of decreased eccentric control were exhibited.                          PT Education - 07/11/14 1514    Education provided Yes   Education Details HEP   Person(Burton) Educated Patient   Methods Explanation;Demonstration   Comprehension Verbalized understanding;Returned demonstration             PT Long Term Goals - 07/06/14 0847    PT LONG TERM GOAL #1   Title Pt will demo increased gait speed from 0.62 m/Burton to >1.3 m/Burton in order to ambulate safely across parking lots when shopping at [employer].   Time 12   Period Weeks   Status New   PT LONG TERM GOAL #2   Title Pt will score an increased score from 33.75% on ABC questionnaire to > 50% in order to demo increased confidence with balance.    Time 12   Period Weeks   Status New   PT LONG TERM GOAL #3   Title Pt will be able to demo decreased  time w/ TUG from 21.28 sec to 18.28 sec while using SPC and a chair without armrests.   Time 12   Period Weeks   Status New               Plan - 07/11/14 1514    Clinical Impression Statement  Verbal cues needed to perform exercises in full ROM. Pain in knees limited her to seated strengthening exercises today. Pt demonstrated decreased endurance with sit-to-stands exhibiting fatigue towards the end of second set with increased breathing ratePatient will continue to benefit form skilled PT to improve strength and mobility.    Pt will benefit from skilled therapeutic intervention in order to improve on the following deficits Abnormal gait;Cardiopulmonary status limiting activity;Decreased endurance;Decreased coordination;Difficulty walking;Decreased range of motion;Decreased safety awareness;Decreased activity tolerance;Decreased balance;Decreased mobility;Decreased strength;Postural dysfunction;Improper body mechanics;Impaired flexibility;Obesity   PT Frequency 2x / week   PT Duration 12 weeks   PT Treatment/Interventions ADLs/Self Care Home Management;Aquatic Therapy;Electrical Stimulation;Biofeedback;Gait training;Neuromuscular re-education;Balance training;Therapeutic exercise;Therapeutic activities;Functional mobility training;Moist Heat;Traction;Patient/family education;Dry needling;Manual techniques;Energy conservation   Consulted and Agree with Plan of Care Patient        Problem List Patient Active Problem List   Diagnosis Date Noted  . 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 Piriformis syndrome 06/18/2014  . Facet syndrome, lumbar 06/18/2014  . 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 DDD (degenerative disc disease), lumbar 06/18/2014  . DDD (degenerative disc disease), cervical 06/08/2014  . 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 Cervical spinal stenosis 04/09/2011    [name], [name] 07/11/2014, 3:20 PM  [facility] MAIN [facility] [address] [place], [place], [address] Phone: [phone]   Fax:  [phone]

## 2014-07-12 ENCOUNTER — Ambulatory Visit: Payer: Medicare Other | Admitting: Pain Medicine

## 2014-07-12 ENCOUNTER — Encounter: Payer: Self-pay | Admitting: Pain Medicine

## 2014-07-12 VITALS — BP 107/56 | HR 71 | Temp 98.4°F | Resp 15 | Ht 64.0 in | Wt 245.0 lb

## 2014-07-12 DIAGNOSIS — M19011 Primary osteoarthritis, right shoulder: Secondary | ICD-10-CM

## 2014-07-12 DIAGNOSIS — M47816 Spondylosis without myelopathy or radiculopathy, lumbar region: Secondary | ICD-10-CM

## 2014-07-12 DIAGNOSIS — M5030 Other cervical disc degeneration, unspecified cervical region: Secondary | ICD-10-CM

## 2014-07-12 DIAGNOSIS — G5700 Lesion of sciatic nerve, unspecified lower limb: Secondary | ICD-10-CM

## 2014-07-12 DIAGNOSIS — G588 Other specified mononeuropathies: Secondary | ICD-10-CM

## 2014-07-12 DIAGNOSIS — M5134 Other intervertebral disc degeneration, thoracic region: Secondary | ICD-10-CM

## 2014-07-12 DIAGNOSIS — M4802 Spinal stenosis, cervical region: Secondary | ICD-10-CM

## 2014-07-12 DIAGNOSIS — M19012 Primary osteoarthritis, left shoulder: Secondary | ICD-10-CM

## 2014-07-12 DIAGNOSIS — R531 Weakness: Secondary | ICD-10-CM | POA: Diagnosis not present

## 2014-07-12 DIAGNOSIS — M533 Sacrococcygeal disorders, not elsewhere classified: Secondary | ICD-10-CM

## 2014-07-12 DIAGNOSIS — M5136 Other intervertebral disc degeneration, lumbar region: Secondary | ICD-10-CM

## 2014-07-12 MED ORDER — HYDROCODONE-ACETAMINOPHEN 5-325 MG PO TABS: ORAL_TABLET | ORAL | Status: DC

## 2014-07-12 NOTE — Progress Notes (Signed)
   Subjective:    Patient ID: [name], female    DOB: 05/26/1940, 74 y.o.   MRN: [id number]  HPI  Patient is 74 year old female [facility] for further evaluation and treatment of pain involving the neck upper extremity regions especially shoulders as well as the upper mid and lower back and lower extremity region especially pain occurring across the buttocks and lumbar region. Patient states present time her pain fairly well-controlled. She is attending physical therapy to be tolerating physical therapy fairly well. We will avoid interventional treatment and will continue presently prescribed medication hydrocodone acetaminophen. The patient was understanding and agreement status treatment plan.     Review of Systems     Objective:   Physical Exam  Was tennis of the splenius capitis and occipitalis musculature region of mild to moderate degree. Palpation of the acromioclavicular and glenohumeral joint region was with increased pain and with evidence of crepitus in the region of the right shoulder. There appeared to be unremarkable Spurling's maneuver. There was well-healed surgical scar of the cervical region without increased warmth erythema region scar. Palpation of the thoracic facet thoracic paraspinal musculature region associated tends to palpation of moderate degree with no crepitus of the thoracic region noted. Palpation over the region of the thoracic region was with evidence of muscle spasms in the lower thoracic paraspinal musculature region of moderate degree. Tinel and Phalen's maneuver without increased pain of any significant degree. Palpation of the lumbar paraspinal muscular region lumbar facet region associated tends to palpation of moderate degree there was severe increase of pain with palpation of the PSIS and PII S region on the left compared to the right. Palpation of the gluteal and piriformis musculature region was more significant on the left  compared to the right as well and there was tennis of the greater trochanteric region iliotibial band region on the left as well as on the right. Straight leg raising was tolerates approximately 30 without an increase of pain with dorsiflexion noted. There was no sensory deficit of dermatomal distribution detected. There was tennis to palpation of the greater trochanteric region iliotibial band region. Abdomen was nontender and no costovertebral tenderness was noted.      Assessment & Plan:    Degenerative disc disease cervical spine Disc space narrowing C5-6 and to a lesser extent C4-5 with disc bulging noted throughout the cervical spine status post surgical intervention of the cervical region  Degenerative disc disease lumbar spine Lumbar facet syndrome  Sacroiliitis  Degenerative joint disease of shoulder  Intercostal neuralgia  Bilateral greater occipital neuralgia    Plan  Continue present medications.Hydrocodone acetaminophen  F/U PCP for evaliation of  BP and general medical  condition.  F/U surgical evaluation.  F/U neurological evaluation.  Continue physical therapy with caution to avoid aggravation of symptoms   May consider radiofrequency rhizolysis or intraspinal procedures pending response to present treatment and F/U evaluation.  Patient to call [facility] should patient have concerns prior to scheduled return appointment.

## 2014-07-12 NOTE — Progress Notes (Signed)
Discharged to home ambulatory with script for hydrocodone in hand. Return in 1 month

## 2014-07-12 NOTE — Progress Notes (Signed)
Safety precautions to be maintained throughout the outpatient stay will include: orient to surroundings, keep bed in low position, maintain call bell within reach at all times, provide assistance with transfer out of bed and ambulation.  

## 2014-07-12 NOTE — Patient Instructions (Signed)
Continue present medications.  F/U PCP for evaliation of  BP and general medical  condition.  F/U surgical evaluation.  F/U neurological evaluation.  May consider radiofrequency rhizolysis or intraspinal procedures pending response to present treatment and F/U evaluation.  Patient to call [facility] should patient have concerns prior to scheduled return appointment.  

## 2014-07-13 ENCOUNTER — Encounter: Payer: Self-pay | Admitting: Physical Therapy

## 2014-07-13 ENCOUNTER — Ambulatory Visit: Payer: Medicare Other | Admitting: Physical Therapy

## 2014-07-13 DIAGNOSIS — R269 Unspecified abnormalities of gait and mobility: Secondary | ICD-10-CM

## 2014-07-13 DIAGNOSIS — R531 Weakness: Secondary | ICD-10-CM | POA: Diagnosis not present

## 2014-07-13 NOTE — Therapy (Signed)
[place] MAIN [facility] SERVICES [address] [place], [place], [address] Phone: [phone]   Fax:  [phone]  Physical Therapy Treatment  Patient Details  Name: [name] MRN: [id number] Date of Birth: 07-Oct-1940 Referring Provider:  Rafael Bihari, MD  Encounter Date: 07/13/2014      PT End of Session - 07/13/14 1320    Visit Number 3   Number of Visits 12   Date for PT Re-Evaluation 09/27/14   Authorization Type Gcode 3   Authorization Time Period 10   PT Start Time 0105   PT Stop Time 0145   PT Time Calculation (min) 40 min   Activity Tolerance Patient tolerated treatment well   Behavior During Therapy [facility] for tasks assessed/performed      Past Medical History  Diagnosis Date  . Dysrhythmia     A-FIB  . AICD (automatic cardioverter/defibrillator) present     PLACED BY  DR  Graciela Husbands 2008  . Hypertension   . Angina     5-6 YRS AGO  . CHF (congestive heart failure)   . Shortness of breath     EASING OFF NOW  . Diabetes mellitus   . Headache(784.0)   . Fibromyalgia   . Arthritis   . Anxiety   . Sleep apnea     DOESN'T KNOW SETTINGS  . History of transient ischemic attack (TIA)     LAST ONE IN  2005  IN  [place], [place]  . Diverticulum of esophagus     large diverticulum in the middle third of the esophagus [facility] EGD '09)  . Complication of anesthesia     states at times had a tough time waking up  . TIA (transient ischemic attack)     Past Surgical History  Procedure Laterality Date  . Cesarean section      X  4  . Insert / replace / remove pacemaker      ST  JUDE  DEVICE  . Cardiac catheterization      X 2    LAST ONE  2009  . Tonsillectomy    . Appendectomy    . Tubal ligation    . Bilateral oophorectomy      BENIGN  . Anterior cervical decomp/discectomy fusion  04/09/2011    Procedure: ANTERIOR CERVICAL DECOMPRESSION/DISCECTOMY FUSION 1 LEVEL;  Surgeon: Temple Pacini, MD;  Location: [facility];  Service:  Neurosurgery;  Laterality: N/A;  Cervical five-six Anterior Cervical Decompression and Fusion  . Cholecystectomy    . Joint replacement      BIL  KNEES    There were no vitals filed for this visit.  Visit Diagnosis:  Weakness  Abnormality of gait      Subjective Assessment - 07/13/14 1315    Subjective Patient continues to have difficulty with geting up from a chair and has decreased standing balance . She will continue to benefit from skilled PT to improve strength and balance.              Therapeutic exercises including: Hip 4 way YTB x 20 BLE Standing on blue foam with ball toss x 20x 2 Modified tandem in corner with head turns x 20 Blue foam and disc tandem with weight shifting left and right x 20 Blue foam with head turns x 20 Leg press x 20 x 3  60 lbs  Squats x 20  PT Long Term Goals - 07/06/14 0847    PT LONG TERM GOAL #1   Title Pt will demo increased gait speed from 0.62 m/s to >1.3 m/s in order to ambulate safely across parking lots when shopping at [employer].   Time 12   Period Weeks   Status New   PT LONG TERM GOAL #2   Title Pt will score an increased score from 33.75% on ABC questionnaire to > 50% in order to demo increased confidence with balance.    Time 12   Period Weeks   Status New   PT LONG TERM GOAL #3   Title Pt will be able to demo decreased time w/ TUG from 21.28 sec to 18.28 sec while using SPC and a chair without armrests.   Time 12   Period Weeks   Status New               Plan - 07/13/14 1322    Clinical Impression Statement  Min cueing needed during hip 3-way to maintain upright posture with knees straight. Pt had good performance of therapeutic exercise today. Patinet will continue to benefit from skilled PT to improve mobitiy and decrease falls risk.    Pt will benefit from skilled therapeutic intervention in order to improve on the following deficits Abnormal  gait;Cardiopulmonary status limiting activity;Decreased endurance;Decreased coordination;Difficulty walking;Decreased range of motion;Decreased safety awareness;Decreased activity tolerance;Decreased balance;Decreased mobility;Decreased strength;Postural dysfunction;Improper body mechanics;Impaired flexibility;Obesity   PT Frequency 2x / week   PT Duration 12 weeks   PT Treatment/Interventions ADLs/Self Care Home Management;Aquatic Therapy;Electrical Stimulation;Biofeedback;Gait training;Neuromuscular re-education;Balance training;Therapeutic exercise;Therapeutic activities;Functional mobility training;Moist Heat;Traction;Patient/family education;Dry needling;Manual techniques;Energy conservation   Consulted and Agree with Plan of Care Patient        Problem List Patient Active Problem List   Diagnosis Date Noted  . 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 Piriformis syndrome 06/18/2014  . Facet syndrome, lumbar 06/18/2014  . 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 DDD (degenerative disc disease), lumbar 06/18/2014  . DDD (degenerative disc disease), cervical 06/08/2014  . 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 Cervical spinal stenosis 04/09/2011    Ezekiel Ina 07/13/2014, 1:27 PM  [place] [facility] MAIN [facility] SERVICES [address] [place], [place], [address] Phone: [phone]   Fax:  [phone]

## 2014-07-18 ENCOUNTER — Encounter: Payer: Medicare Other | Admitting: Physical Therapy

## 2014-07-20 ENCOUNTER — Encounter: Payer: Self-pay | Admitting: Physical Therapy

## 2014-07-20 ENCOUNTER — Ambulatory Visit: Payer: Medicare Other | Admitting: Physical Therapy

## 2014-07-20 DIAGNOSIS — R269 Unspecified abnormalities of gait and mobility: Secondary | ICD-10-CM

## 2014-07-20 DIAGNOSIS — R531 Weakness: Secondary | ICD-10-CM | POA: Diagnosis not present

## 2014-07-20 NOTE — Patient Instructions (Signed)
All exercises provided were adapted from [email or web address]. Patient was provided a written handout with pictures as described. Any additional cues were manually entered in to handout and copied in to this document.  Supine bridge (6 times, hold for 3 seconds, 2 sets, 1x per day)  Lie down on your back and bend your knees so that your feet are flat on the table. Press your heels into the ground to lift your hips off of the table. You should stabilize through your core. Return to the starting position controlling your speed.    ** Lift your toes off the table, press through your heels. WE ONLY WANT TO FEEL THIS IN THE BACK OF YOUR HIPS. Lift your hips slightly off the table.

## 2014-07-20 NOTE — Therapy (Signed)
[facility] MAIN [facility] SERVICES [address] [place], [place], [address] Phone: [phone]   Fax:  [phone]  Physical Therapy Treatment  Patient Details  Name: [name] MRN: [id number] Date of Birth: 09-May-1940 Referring Provider:  Morene Crocker, MD  Encounter Date: 07/20/2014      PT End of Session - 07/20/14 1357    Visit Number 4   Number of Visits 12   Date for PT Re-Evaluation 09/27/14   Authorization Type 4/10    PT Start Time 1304   PT Stop Time 1350   PT Time Calculation (min) 46 min   Equipment Utilized During Treatment Gait belt   Activity Tolerance Patient tolerated treatment well;Patient limited by fatigue   Behavior During Therapy [facility] for tasks assessed/performed      Past Medical History  Diagnosis Date  . Dysrhythmia     A-FIB  . AICD (automatic cardioverter/defibrillator) present     PLACED BY  DR  Graciela Husbands 2008  . Hypertension   . Angina     5-6 YRS AGO  . CHF (congestive heart failure)   . Shortness of breath     EASING OFF NOW  . Diabetes mellitus   . Headache(784.0)   . Fibromyalgia   . Arthritis   . Anxiety   . Sleep apnea     DOESN'T KNOW SETTINGS  . History of transient ischemic attack (TIA)     LAST ONE IN  2005  IN  [place], [place]  . Diverticulum of esophagus     large diverticulum in the middle third of the esophagus [facility] EGD '09)  . Complication of anesthesia     states at times had a tough time waking up  . TIA (transient ischemic attack)     Past Surgical History  Procedure Laterality Date  . Cesarean section      X  4  . Insert / replace / remove pacemaker      ST  JUDE  DEVICE  . Cardiac catheterization      X 2    LAST ONE  2009  . Tonsillectomy    . Appendectomy    . Tubal ligation    . Bilateral oophorectomy      BENIGN  . Anterior cervical decomp/discectomy fusion  04/09/2011    Procedure: ANTERIOR CERVICAL DECOMPRESSION/DISCECTOMY FUSION 1 LEVEL;  Surgeon: Temple Pacini, MD;  Location: [facility];  Service: Neurosurgery;  Laterality: N/A;  Cervical five-six Anterior Cervical Decompression and Fusion  . Cholecystectomy    . Joint replacement      BIL  KNEES    There were no vitals filed for this visit.  Visit Diagnosis:  Weakness  Abnormality of gait      Subjective Assessment - 07/20/14 1307    Subjective Patient finds sitting to standing to be much improved with breathing regulation on transfer. Patient reports she has been compliant with her HEP. She reports no new falls from her previous visit.    Patient Stated Goals improve balance    Currently in Pain? Yes   Pain Score 6    Pain Location Knee   Pain Orientation Right   Pain Descriptors / Indicators Aching;Constant   Pain Type Chronic pain   Pain Onset More than a month ago   Pain Frequency Constant   Aggravating Factors  Weight bearing activiity       There-ex  Sit to stand training x 5, x 3 with  chair hand hold assistance. 5x sit to stand completed in 23.86 seconds. Supervision level assistance.  Standing calf stretch x 8 bilaterally with cuing for heel down, she states it increased her pain in her right knee Standing hip flexion marching x 8 bilaterally for 2 sets with bilateral hand hold assist  Toe touching to blue foam x 8 for 2 sets Step ups to blue foam x 8 for 2 sets with bilateral hand hold assistance, good gluteal activation Standing hip abductions x 10, x 5 with yellow theraband Supine bridging with cuing for pushing through her heels with her toes elevated. X5, for 2 sets. Good gluteal activation, minimal if any lumbar extension.                           PT Education - 07/20/14 1356    Education provided Yes   Education Details Progressed HEP, educated patient on the role of the posterolateral hip musculature in the role of low back and knee pain.    Person(s) Educated Patient   Methods Explanation;Demonstration;Handout   Comprehension  Verbalized understanding;Returned demonstration             PT Long Term Goals - 07/06/14 0847    PT LONG TERM GOAL #1   Title Pt will demo increased gait speed from 0.62 m/s to >1.3 m/s in order to ambulate safely across parking lots when shopping at [employer].   Time 12   Period Weeks   Status New   PT LONG TERM GOAL #2   Title Pt will score an increased score from 33.75% on ABC questionnaire to > 50% in order to demo increased confidence with balance.    Time 12   Period Weeks   Status New   PT LONG TERM GOAL #3   Title Pt will be able to demo decreased time w/ TUG from 21.28 sec to 18.28 sec while using SPC and a chair without armrests.   Time 12   Period Weeks   Status New               Plan - 07/20/14 1358    Clinical Impression Statement Patient fatigues quickly and states she has had chronic knee and lower back pain with activity. It appears patient has altered neuromuscular patterning, biasing lumbar extension rather than hip extension. With cuing she was able to properly complete partial range of motion bridging, a low level hip extension activity. Patient required frequent rest breaks, but was able to complete all activities today. Patient would continue to benefit from skilled PT services to address her strength, pain, and balance complaints to increase her safety and tolerance for weight bearing activities.    Pt will benefit from skilled therapeutic intervention in order to improve on the following deficits Abnormal gait;Cardiopulmonary status limiting activity;Decreased endurance;Decreased coordination;Difficulty walking;Decreased range of motion;Decreased safety awareness;Decreased activity tolerance;Decreased balance;Decreased mobility;Decreased strength;Postural dysfunction;Improper body mechanics;Impaired flexibility;Obesity   Rehab Potential Good   Clinical Impairments Affecting Rehab Potential Multiple surgeries, chronic pain    PT Frequency 2x / week   PT  Duration 12 weeks   PT Treatment/Interventions ADLs/Self Care Home Management;Aquatic Therapy;Electrical Stimulation;Biofeedback;Gait training;Neuromuscular re-education;Balance training;Therapeutic exercise;Therapeutic activities;Functional mobility training;Moist Heat;Traction;Patient/family education;Dry needling;Manual techniques;Energy conservation   PT Next Visit Plan Assess for low back pain and knee pain, possibly utilizing manual techniques. Increase gluteal musculature strengthening and neuromuscular patterning.    PT Home Exercise Plan See patient instructions    Consulted and Agree with  Plan of Care Patient        Problem List Patient Active Problem List   Diagnosis Date Noted  . 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 Piriformis syndrome 06/18/2014  . Facet syndrome, lumbar 06/18/2014  . 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 DDD (degenerative disc disease), lumbar 06/18/2014  . DDD (degenerative disc disease), cervical 06/08/2014  . 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 Cervical spinal stenosis 04/09/2011   Kerin Ransom, PT, DPT   07/20/2014, 2:06 PM  [place] [facility] MAIN [facility] SERVICES [address] [place], [place], [address] Phone: [phone]   Fax:  [phone]

## 2014-07-25 ENCOUNTER — Ambulatory Visit: Payer: Medicare Other | Admitting: Physical Therapy

## 2014-07-25 DIAGNOSIS — R531 Weakness: Secondary | ICD-10-CM

## 2014-07-25 DIAGNOSIS — R269 Unspecified abnormalities of gait and mobility: Secondary | ICD-10-CM

## 2014-07-25 NOTE — Patient Instructions (Addendum)
HEP emailed to patient consisting of lower trunk rotations, supine bridging.

## 2014-07-26 NOTE — Therapy (Signed)
[facility] MAIN [facility] SERVICES [address] [place], [place], [address] Phone: [phone]   Fax:  [phone]  Physical Therapy Treatment  Patient Details  Name: [name] MRN: [id number] Date of Birth: August 11, 1940 Referring Provider:  Rafael Bihari, MD  Encounter Date: 07/25/2014      PT End of Session - 07/25/14 1728    Visit Number 5   Number of Visits 12   Date for PT Re-Evaluation 09/27/14   Authorization Type 5/10   Authorization Time Period 10   PT Start Time 1300   PT Stop Time 1345   PT Time Calculation (min) 45 min   Equipment Utilized During Treatment Gait belt   Activity Tolerance Patient tolerated treatment well;Patient limited by fatigue   Behavior During Therapy [facility] for tasks assessed/performed      Past Medical History  Diagnosis Date  . Dysrhythmia     A-FIB  . AICD (automatic cardioverter/defibrillator) present     PLACED BY  DR  Graciela Husbands 2008  . Hypertension   . Angina     5-6 YRS AGO  . CHF (congestive heart failure)   . Shortness of breath     EASING OFF NOW  . Diabetes mellitus   . Headache(784.0)   . Fibromyalgia   . Arthritis   . Anxiety   . Sleep apnea     DOESN'T KNOW SETTINGS  . History of transient ischemic attack (TIA)     LAST ONE IN  2005  IN  [place], [place]  . Diverticulum of esophagus     large diverticulum in the middle third of the esophagus [facility] EGD '09)  . Complication of anesthesia     states at times had a tough time waking up  . TIA (transient ischemic attack)     Past Surgical History  Procedure Laterality Date  . Cesarean section      X  4  . Insert / replace / remove pacemaker      ST  JUDE  DEVICE  . Cardiac catheterization      X 2    LAST ONE  2009  . Tonsillectomy    . Appendectomy    . Tubal ligation    . Bilateral oophorectomy      BENIGN  . Anterior cervical decomp/discectomy fusion  04/09/2011    Procedure: ANTERIOR CERVICAL DECOMPRESSION/DISCECTOMY FUSION  1 LEVEL;  Surgeon: Temple Pacini, MD;  Location: [facility];  Service: Neurosurgery;  Laterality: N/A;  Cervical five-six Anterior Cervical Decompression and Fusion  . Cholecystectomy    . Joint replacement      BIL  KNEES    There were no vitals filed for this visit.  Visit Diagnosis:  Weakness  Abnormality of gait      Subjective Assessment - 07/25/14 1301    Subjective Patient reports that her HEP has been "hit and miss". She has noticed increased    Limitations Walking;House hold activities;Other (comment)   Patient Stated Goals improve balance, decrease pain globally    Currently in Pain? Yes   Pain Location Hip   Pain Orientation Right   Pain Descriptors / Indicators Aching   Pain Onset In the past 7 days   Pain Frequency Intermittent   Aggravating Factors  Walking    Multiple Pain Sites Yes   Pain Score --  At rest 0, with standing/activity can go up to an 8   Pain Location Back   Pain Orientation  Lower   Pain Descriptors / Indicators Aching   Pain Type Chronic pain   Pain Onset More than a month ago   Pain Frequency Intermittent       There-Ex Lower trunk rotations x 12 bilaterally for 2 sets  Supine bridging x 8 repetitions for 2 sets  Piriformis stretching in supine x 10 repetitions with 3 second holds x 1 set    Neuromuscular Re-education  Marching in place on blue foam pad x 30 seconds for 2 sets  Tandem standing in // bars  Step ups to blue foam pad x 12 bilaterally for 2 sets bilaterally  Heel raises from 2 feet transitioning to TTWB on one foot. X 12 bilaterally for 1 set Sit to stand training x5 repetitions for 3 sets                         PT Education - 07/25/14 1727    Education provided Yes   Education Details Updated HEP, discussed importance of adherence to HEP. Discussed the need for patient to increase her daily ambulation.    Person(s) Educated Patient   Methods Explanation;Demonstration;Handout   Comprehension  Verbalized understanding;Returned demonstration             PT Long Term Goals - 07/06/14 0847    PT LONG TERM GOAL #1   Title Pt will demo increased gait speed from 0.62 m/s to >1.3 m/s in order to ambulate safely across parking lots when shopping at [place].   Time 12   Period Weeks   Status New   PT LONG TERM GOAL #2   Title Pt will score an increased score from 33.75% on ABC questionnaire to > 50% in order to demo increased confidence with balance.    Time 12   Period Weeks   Status New   PT LONG TERM GOAL #3   Title Pt will be able to demo decreased time w/ TUG from 21.28 sec to 18.28 sec while using SPC and a chair without armrests.   Time 12   Period Weeks   Status New               Plan - 07/25/14 1729    Clinical Impression Statement Patient describes increased lower back, right hip, and knee pain with weight bearing activities throughout this session. Patient displays fatigue and LE weakness which are likely contributing to her symptoms and reducing her progression through balance activities. Patient did find some temporary relief from lower trunk rotations, but this did not carry over in weight bearing activities. She was able to complete small step ups with cuing for hip extension with no trunk flexion with no increase in symptoms. Patient would benefit from additional PT services to address her pain and LE weakness, impairing her safety with functional mobility.    Pt will benefit from skilled therapeutic intervention in order to improve on the following deficits Abnormal gait;Cardiopulmonary status limiting activity;Decreased endurance;Decreased coordination;Difficulty walking;Decreased range of motion;Decreased safety awareness;Decreased activity tolerance;Decreased balance;Decreased mobility;Decreased strength;Postural dysfunction;Improper body mechanics;Impaired flexibility;Obesity   Rehab Potential Good   Clinical Impairments Affecting Rehab Potential Multiple  surgeries, chronic pain    PT Frequency 2x / week   PT Duration 12 weeks   PT Treatment/Interventions ADLs/Self Care Home Management;Aquatic Therapy;Electrical Stimulation;Biofeedback;Gait training;Neuromuscular re-education;Balance training;Therapeutic exercise;Therapeutic activities;Functional mobility training;Moist Heat;Traction;Patient/family education;Dry needling;Manual techniques;Energy conservation   PT Next Visit Plan Provide non-painful gluteal strengthening exercises, calf raises, manual techniques for LBP.  PT Home Exercise Plan See patient instructions    Consulted and Agree with Plan of Care Patient        Problem List Patient Active Problem List   Diagnosis Date Noted  . 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 Piriformis syndrome 06/18/2014  . Facet syndrome, lumbar 06/18/2014  . 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 DDD (degenerative disc disease), lumbar 06/18/2014  . DDD (degenerative disc disease), cervical 06/08/2014  . 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 Cervical spinal stenosis 04/09/2011   Kerin Ransom, PT, DPT   07/26/2014, 4:41 PM  [facility] MAIN [facility] SERVICES [address] [place], [place], [address] Phone: [phone]   Fax:  [phone]

## 2014-07-27 ENCOUNTER — Ambulatory Visit: Payer: Medicare Other | Admitting: Physical Therapy

## 2014-07-27 ENCOUNTER — Encounter: Payer: Self-pay | Admitting: Physical Therapy

## 2014-07-27 DIAGNOSIS — R531 Weakness: Secondary | ICD-10-CM | POA: Diagnosis not present

## 2014-07-27 DIAGNOSIS — R269 Unspecified abnormalities of gait and mobility: Secondary | ICD-10-CM

## 2014-07-28 NOTE — Therapy (Signed)
[facility] MAIN [facility] SERVICES [address] [place], [place], [address] Phone: [phone]   Fax:  [phone]  Physical Therapy Treatment  Patient Details  Name: [name] MRN: [id number] Date of Birth: September 24, 1940 Referring Provider:  Rafael Bihari, MD  Encounter Date: 07/27/2014      PT End of Session - 07/27/14 1516    Visit Number 6   Number of Visits 12   Date for PT Re-Evaluation 09/27/14   Authorization Type 6/10   PT Start Time 1300   PT Stop Time 1343   PT Time Calculation (min) 43 min   Equipment Utilized During Treatment Gait belt   Activity Tolerance Patient tolerated treatment well;Patient limited by fatigue   Behavior During Therapy [facility] for tasks assessed/performed      Past Medical History  Diagnosis Date  . Dysrhythmia     A-FIB  . AICD (automatic cardioverter/defibrillator) present     PLACED BY  DR  Graciela Husbands 2008  . Hypertension   . Angina     5-6 YRS AGO  . CHF (congestive heart failure)   . Shortness of breath     EASING OFF NOW  . Diabetes mellitus   . Headache(784.0)   . Fibromyalgia   . Arthritis   . Anxiety   . Sleep apnea     DOESN'T KNOW SETTINGS  . History of transient ischemic attack (TIA)     LAST ONE IN  2005  IN  [place], [place]  . Diverticulum of esophagus     large diverticulum in the middle third of the esophagus [facility] EGD '09)  . Complication of anesthesia     states at times had a tough time waking up  . TIA (transient ischemic attack)     Past Surgical History  Procedure Laterality Date  . Cesarean section      X  4  . Insert / replace / remove pacemaker      ST  JUDE  DEVICE  . Cardiac catheterization      X 2    LAST ONE  2009  . Tonsillectomy    . Appendectomy    . Tubal ligation    . Bilateral oophorectomy      BENIGN  . Anterior cervical decomp/discectomy fusion  04/09/2011    Procedure: ANTERIOR CERVICAL DECOMPRESSION/DISCECTOMY FUSION 1 LEVEL;  Surgeon: Temple Pacini, MD;  Location: [facility];  Service: Neurosurgery;  Laterality: N/A;  Cervical five-six Anterior Cervical Decompression and Fusion  . Cholecystectomy    . Joint replacement      BIL  KNEES    There were no vitals filed for this visit.  Visit Diagnosis:  Weakness  Abnormality of gait      Subjective Assessment - 07/27/14 1302    Subjective Patient reports things have been "good" since her last visit. She reports she has not been able to go for a walk, but was able to perform the side to side trunk rocking which was helpful.    Limitations Walking;House hold activities;Other (comment)   Patient Stated Goals improve balance, decrease pain globally    Currently in Pain? No/denies     There-Ex  NuStep 3 minutes level 2 (not billed)   Lower trunk rotations 2 sets x 12 bilaterally  Supine bridging 2 sets x 10 (second set with red theraband) Supine clamshells 2 sets x 12 with red theraband  Step ups to blue foam x 5 per side  Modified tandem stance bilaterally with 15 seconds x 3 sets Single leg hip marching with 2 second hold before advancing x 10' in parallel bars for 6 repetitions (fatiguing) Standing hip abductions x 10 bilaterally with yellow theraband  Sit to stand x 3 with cuing for 2 sets                             PT Education - 07/28/14 0816    Education provided Yes   Education Details Patient educated to be diligent with HEP and the importance of such. To increase her daily ambulation and provided with therapeutic neuroscience education.    Person(s) Educated Patient   Methods Explanation;Demonstration   Comprehension Verbalized understanding;Returned demonstration             PT Long Term Goals - 07/06/14 0847    PT LONG TERM GOAL #1   Title Pt will demo increased gait speed from 0.62 m/s to >1.3 m/s in order to ambulate safely across parking lots when shopping at [place].   Time 12   Period Weeks   Status New   PT LONG TERM  GOAL #2   Title Pt will score an increased score from 33.75% on ABC questionnaire to > 50% in order to demo increased confidence with balance.    Time 12   Period Weeks   Status New   PT LONG TERM GOAL #3   Title Pt will be able to demo decreased time w/ TUG from 21.28 sec to 18.28 sec while using SPC and a chair without armrests.   Time 12   Period Weeks   Status New               Plan - 07/28/14 0817    Clinical Impression Statement Patient does not report any pain throughout the session today. She displays increased LE strength today displayed by improved sit to stand independence and step up tolerance. Patient continues to struggle with narrow BOS and has decreased gait speed, and would benefit from skilled PT services to increase her balance and safety with mobility.    Pt will benefit from skilled therapeutic intervention in order to improve on the following deficits Abnormal gait;Cardiopulmonary status limiting activity;Decreased endurance;Decreased coordination;Difficulty walking;Decreased range of motion;Decreased safety awareness;Decreased activity tolerance;Decreased balance;Decreased mobility;Decreased strength;Postural dysfunction;Improper body mechanics;Impaired flexibility;Obesity   Rehab Potential Good   Clinical Impairments Affecting Rehab Potential Multiple surgeries, chronic pain    PT Frequency 2x / week   PT Duration 12 weeks   PT Treatment/Interventions ADLs/Self Care Home Management;Aquatic Therapy;Electrical Stimulation;Biofeedback;Gait training;Neuromuscular re-education;Balance training;Therapeutic exercise;Therapeutic activities;Functional mobility training;Moist Heat;Traction;Patient/family education;Dry needling;Manual techniques;Energy conservation   PT Next Visit Plan Progress gluteal strengthening, narrow BOS training, and gait training drills (such as obstacle course)    PT Home Exercise Plan Maintained from previous session    Consulted and Agree with  Plan of Care Patient        Problem List Patient Active Problem List   Diagnosis Date Noted  . 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 Piriformis syndrome 06/18/2014  . Facet syndrome, lumbar 06/18/2014  . 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 DDD (degenerative disc disease), lumbar 06/18/2014  . DDD (degenerative disc disease), cervical 06/08/2014  . 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 Cervical spinal stenosis 04/09/2011    Kerin Ransom, PT, DPT   07/28/2014, 8:21 AM  [facility] MAIN REHAB  SERVICES [address] [place], [place], [address] Phone: [phone]   Fax:  [phone]

## 2014-08-01 ENCOUNTER — Encounter: Payer: Medicare Other | Admitting: Physical Therapy

## 2014-08-03 ENCOUNTER — Encounter: Payer: Self-pay | Admitting: Physical Therapy

## 2014-08-03 ENCOUNTER — Ambulatory Visit: Payer: Medicare Other | Admitting: Physical Therapy

## 2014-08-03 DIAGNOSIS — R531 Weakness: Secondary | ICD-10-CM

## 2014-08-03 DIAGNOSIS — R269 Unspecified abnormalities of gait and mobility: Secondary | ICD-10-CM

## 2014-08-03 NOTE — Therapy (Signed)
[facility] [address] [place], [place], [address] Phone: [phone]   Fax:  [phone]  Physical Therapy Treatment  Patient Details  Name: Heather LoftySandra J Willaims MRN: [id number] Date of Birth: 09/20/1940 Referring Provider:  Rafael BihariWalker, John B III, MD  Encounter Date: 08/03/2014      PT End of Session - 08/03/14 1316    Visit Number 7   Number of Visits 12   Date for PT Re-Evaluation 09/27/14   Authorization Type 6/10   Equipment Utilized During Treatment Gait belt   Activity Tolerance Patient tolerated treatment well;Patient limited by fatigue   Behavior [facility] Therapy [facility] for tasks assessed/performed      Past Medical History  Diagnosis Date  . Dysrhythmia     A-FIB  . AICD (automatic cardioverter/defibrillator) present     PLACED BY  DR  Graciela HusbandsKLEIN 2008  . Hypertension   . Angina     5-6 YRS AGO  . CHF (congestive heart failure)   . Shortness of breath     EASING OFF NOW  . Diabetes mellitus   . Headache(784.0)   . Fibromyalgia   . Arthritis   . Anxiety   . Sleep apnea     DOESN'T KNOW SETTINGS  . History of transient ischemic attack (TIA)     [place]  2005  [place]  . Diverticulum of esophagus     large diverticulum in the middle third of the esophagus [facility] E[facility] '09[facility]  . Complication of anesthesia     states at times had a tough time waking up  . TIA (transient ischemic attack)     Past Surgical History  Procedure Laterality Date  . Cesarean section      X  4  . Insert / replace / remove pacemaker      ST  JUDE  DEVICE  . Cardiac catheterization      X 2    LAST ONE  2009  . Tonsillectomy    . Appendectomy    . Tubal ligation    . Bilateral oophorectomy      BENIGN  . Anterior cervical decomp/discectomy fusion  04/09/2011    Procedure: ANTERIOR CERVICAL DECOMPRESSION/DISCECTOMY FUSION 1 LEVEL;  Surgeon: Temple PaciniHenry A Pool, MD;  Location: [facility];  Service: Neurosurgery;  Laterality: N/A;   Cervical five-six Anterior Cervical Decompression and Fusion  . Cholecystectomy    . Joint replacement      BIL  KNEES    There were no vitals filed for this visit.  Visit Diagnosis:  Weakness  Abnormality of gait      Subjective Assessment - 08/03/14 1315    Subjective Patient reports things have been "good" since her last visit. She reports she has not been able to go for a walk, but was able to perform the side to side trunk rocking which was helpful.    Limitations Walking;House hold activities;Other (comment)   Patient Stated Goals improve balance, decrease pain globally    Pain Onset In the past 7 days   Pain Onset More than a month ago        Neuromuscular training including: Balance beam walking fwd and side stepping x 10 Blue foam and head turns, weight shifting left and right Stepping up to stool x 20 Tapping stool x 20  Ball sorting on blue foam Multiple loss of balance but performance improves with practice    Therapeutic exercise including: Leg press  x 15 x 3 90 # RTB 4 way hip Squat x 10 Sit to stand x 10 Muscle fatigue but no major pain complaints. Patient advancing to red theraband for exercises listed above.Patient demonstrating more control with  squat exercise.                         PT Education - 08/03/14 1316    Education provided Yes   Person(s) Educated Patient   Methods Explanation   Comprehension Verbalized understanding             PT Long Term Goals - 07/06/14 0847    PT LONG TERM GOAL #1   Title Pt will demo increased gait speed from 0.62 m/s to >1.3 m/s in order to ambulate safely across parking lots when shopping at [place].   Time 12   Period Weeks   Status New   PT LONG TERM GOAL #2   Title Pt will score an increased score from 33.75% on ABC questionnaire to > 50% in order to demo increased confidence with balance.    Time 12   Period Weeks   Status New   PT LONG TERM GOAL #3   Title Pt will be  able to demo decreased time w/ TUG from 21.28 sec to 18.28 sec while using SPC and a chair without armrests.   Time 12   Period Weeks   Status New               Plan - 08/03/14 1317    Clinical Impression Statement Patient does not report any pain. She had a procedure on tuesday with a botox injection in her bladder. She is doing well with no leaking, patient is very excited. She contiues to have balance deficits in standing and weakness in BLE.    Pt will benefit from skilled therapeutic intervention in order to improve on the following deficits Abnormal gait;Cardiopulmonary status limiting activity;Decreased endurance;Decreased coordination;Difficulty walking;Decreased range of motion;Decreased safety awareness;Decreased activity tolerance;Decreased balance;Decreased mobility;Decreased strength;Postural dysfunction;Improper body mechanics;Impaired flexibility;Obesity   Rehab Potential Good   Clinical Impairments Affecting Rehab Potential Multiple surgeries, chronic pain    PT Frequency 2x / week   PT Duration 12 weeks   PT Treatment/Interventions ADLs/Self Care Home Management;Aquatic Therapy;Electrical Stimulation;Biofeedback;Gait training;Neuromuscular re-education;Balance training;Therapeutic exercise;Therapeutic activities;Functional mobility training;Moist Heat;Traction;Patient/family education;Dry needling;Manual techniques;Energy conservation   PT Next Visit Plan Progress gluteal strengthening, narrow BOS training, and gait training drills (such as obstacle course)    PT Home Exercise Plan Maintained from previous session    Consulted and Agree with Plan of Care Patient        Problem List Patient Active Problem List   Diagnosis Date Noted  . 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 Piriformis syndrome 06/18/2014  . Facet syndrome, lumbar 06/18/2014  . 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 DDD (degenerative disc disease), lumbar 06/18/2014  . DDD (degenerative disc disease),  cervical 06/08/2014  . 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 Cervical spinal stenosis 04/09/2011    Ezekiel Ina 08/03/2014, 2:05 PM  [facility] MAIN [facility] SERVICES [address] [place], [place], [address] Phone: [phone]   Fax:  [phone]

## 2014-08-08 ENCOUNTER — Ambulatory Visit: Payer: Medicare Other | Attending: Neurology | Admitting: Physical Therapy

## 2014-08-08 ENCOUNTER — Encounter: Payer: Self-pay | Admitting: Physical Therapy

## 2014-08-08 DIAGNOSIS — R269 Unspecified abnormalities of gait and mobility: Secondary | ICD-10-CM | POA: Insufficient documentation

## 2014-08-08 DIAGNOSIS — R2681 Unsteadiness on feet: Secondary | ICD-10-CM | POA: Diagnosis not present

## 2014-08-08 DIAGNOSIS — R531 Weakness: Secondary | ICD-10-CM | POA: Diagnosis not present

## 2014-08-08 NOTE — Therapy (Signed)
[facility] [address] [place], [place], [address] Phone: [phone]   Fax:  [phone]  Physical Therapy Treatment  Patient Details  Name: [name] MRN: [id number] Date of Birth: 06/30/1940 Referring Provider:  Morene CrockerPotter, Zachary E, MD  Encounter Date: 08/08/2014      PT End of Session - 08/08/14 1316    Visit Number 8   Number of Visits 12   Date for PT Re-Evaluation 09/27/14   Authorization Type 7   Authorization Time Period 10   PT Start Time 1300   PT Stop Time 1345   PT Time Calculation (min) 45 min   Equipment Utilized During Treatment Gait belt   Activity Tolerance Patient tolerated treatment well;Patient limited by fatigue   Behavior During Therapy [facility] for tasks assessed/performed      Past Medical History  Diagnosis Date  . Dysrhythmia     A-FIB  . AICD (automatic cardioverter/defibrillator) present     PLACED BY  DR  Graciela HusbandsKLEIN 2008  . Hypertension   . Angina     5-6 YRS AGO  . CHF (congestive heart failure)   . Shortness of breath     EASING OFF NOW  . Diabetes mellitus   . Headache(784.0)   . Fibromyalgia   . Arthritis   . Anxiety   . Sleep apnea     DOESN'T KNOW SETTINGS  . History of transient ischemic attack (TIA)     [place]  . Diverticulum of esophagus     large diverticulum in the middle third of the esophagus [facility] EGD '09)  . Complication of anesthesia     states at times had a tough time waking up  . TIA (transient ischemic attack)     Past Surgical History  Procedure Laterality Date  . Cesarean section      X  4  . Insert / replace / remove pacemaker      ST  JUDE  DEVICE  . Cardiac catheterization      X 2    LAST ONE  2009  . Tonsillectomy    . Appendectomy    . Tubal ligation    . Bilateral oophorectomy      BENIGN  . Anterior cervical decomp/discectomy fusion  04/09/2011    Procedure: ANTERIOR CERVICAL DECOMPRESSION/DISCECTOMY FUSION 1  LEVEL;  Surgeon: Temple PaciniHenry A Pool, MD;  Location: [facility];  Service: Neurosurgery;  Laterality: N/A;  Cervical five-six Anterior Cervical Decompression and Fusion  . Cholecystectomy    . Joint replacement      BIL  KNEES    There were no vitals filed for this visit.  Visit Diagnosis:  Weakness  Abnormality of gait      Subjective Assessment - 08/08/14 1315    Subjective Patient reports things have been "good" since her last visit. She reports she has not been able to go for a walk, but was able to perform the side to side trunk rocking which was helpful.    Limitations Walking;House hold activities;Other (comment)   Patient Stated Goals improve balance, decrease pain globally    Currently in Pain? Yes   Pain Score 4    Pain Location Hip   Pain Onset In the past 7 days   Aggravating Factors  walking   Pain Onset More than a month ago         Neuromuscular training including: Balance beam walking  fwd and side stepping x 10 Blue foam and head turns, weight shifting left and right Stepping up to stool x 20 Tapping stool x 20  Ball sorting on blue foam Multiple loss of balance but performance improves with practice   Therapeutic exercise including: Leg press x 15 x 3 90 # RTB 4 way hip Squat x 10 Sit to stand x 10 Muscle fatigue but no major pain complaints. Patient advancing to red theraband for exercises listed above.Patient demonstrating more control with squat exercise                         PT Education - 08/08/14 1316    Education provided Yes   Person(s) Educated Patient   Methods Explanation   Comprehension Verbalized understanding             PT Long Term Goals - 07/06/14 0847    PT LONG TERM GOAL #1   Title Pt will demo increased gait speed from 0.62 m/s to >1.3 m/s in order to ambulate safely across parking lots when shopping at [place].   Time 12   Period Weeks   Status New   PT LONG TERM GOAL #2   Title Pt will score  an increased score from 33.75% on ABC questionnaire to > 50% in order to demo increased confidence with balance.    Time 12   Period Weeks   Status New   PT LONG TERM GOAL #3   Title Pt will be able to demo decreased time w/ TUG from 21.28 sec to 18.28 sec while using SPC and a chair without armrests.   Time 12   Period Weeks   Status New               Plan - 08/08/14 1317    Clinical Impression Statement Pt demonstrated decreased endurance with sit-to-stands exhibiting fatigue towards the end of second set with increased breathing rate.Unable to perform hip ext without bending knee or leaning forward due to complaint of hip pain.   Pt will benefit from skilled therapeutic intervention in order to improve on the following deficits Abnormal gait;Cardiopulmonary status limiting activity;Decreased endurance;Decreased coordination;Difficulty walking;Decreased range of motion;Decreased safety awareness;Decreased activity tolerance;Decreased balance;Decreased mobility;Decreased strength;Postural dysfunction;Improper body mechanics;Impaired flexibility;Obesity   Rehab Potential Good   Clinical Impairments Affecting Rehab Potential Multiple surgeries, chronic pain    PT Frequency 2x / week   PT Duration 12 weeks   PT Treatment/Interventions ADLs/Self Care Home Management;Aquatic Therapy;Electrical Stimulation;Biofeedback;Gait training;Neuromuscular re-education;Balance training;Therapeutic exercise;Therapeutic activities;Functional mobility training;Moist Heat;Traction;Patient/family education;Dry needling;Manual techniques;Energy conservation   PT Next Visit Plan Progress gluteal strengthening, narrow BOS training, and gait training drills (such as obstacle course)    PT Home Exercise Plan Maintained from previous session    Consulted and Agree with Plan of Care Patient        Problem List Patient Active Problem List   Diagnosis Date Noted  . 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  Piriformis syndrome 06/18/2014  . Facet syndrome, lumbar 06/18/2014  . 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 DDD (degenerative disc disease), lumbar 06/18/2014  . DDD (degenerative disc disease), cervical 06/08/2014  . 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 Cervical spinal stenosis 04/09/2011    Ezekiel Ina 08/08/2014, 1:19 PM  [facility] MAIN [facility] SERVICES [address] [place], [place], [address] Phone: [phone]   Fax:  [phone]

## 2014-08-09 ENCOUNTER — Ambulatory Visit: Payer: Medicare Other | Attending: Pain Medicine | Admitting: Pain Medicine

## 2014-08-09 ENCOUNTER — Encounter: Payer: Self-pay | Admitting: Pain Medicine

## 2014-08-09 VITALS — BP 127/63 | HR 65 | Temp 97.2°F | Resp 18 | Ht 64.0 in | Wt 238.0 lb

## 2014-08-09 DIAGNOSIS — G588 Other specified mononeuropathies: Secondary | ICD-10-CM

## 2014-08-09 DIAGNOSIS — M545 Low back pain: Secondary | ICD-10-CM | POA: Diagnosis present

## 2014-08-09 DIAGNOSIS — M19019 Primary osteoarthritis, unspecified shoulder: Secondary | ICD-10-CM | POA: Diagnosis not present

## 2014-08-09 DIAGNOSIS — M25512 Pain in left shoulder: Secondary | ICD-10-CM | POA: Diagnosis present

## 2014-08-09 DIAGNOSIS — Z9889 Other specified postprocedural states: Secondary | ICD-10-CM | POA: Insufficient documentation

## 2014-08-09 DIAGNOSIS — M19012 Primary osteoarthritis, left shoulder: Secondary | ICD-10-CM

## 2014-08-09 DIAGNOSIS — M47816 Spondylosis without myelopathy or radiculopathy, lumbar region: Secondary | ICD-10-CM

## 2014-08-09 DIAGNOSIS — M19011 Primary osteoarthritis, right shoulder: Secondary | ICD-10-CM

## 2014-08-09 DIAGNOSIS — G5700 Lesion of sciatic nerve, unspecified lower limb: Secondary | ICD-10-CM

## 2014-08-09 DIAGNOSIS — M533 Sacrococcygeal disorders, not elsewhere classified: Secondary | ICD-10-CM | POA: Diagnosis not present

## 2014-08-09 DIAGNOSIS — M5030 Other cervical disc degeneration, unspecified cervical region: Secondary | ICD-10-CM | POA: Diagnosis not present

## 2014-08-09 DIAGNOSIS — M542 Cervicalgia: Secondary | ICD-10-CM | POA: Diagnosis present

## 2014-08-09 DIAGNOSIS — M5134 Other intervertebral disc degeneration, thoracic region: Secondary | ICD-10-CM

## 2014-08-09 DIAGNOSIS — M5136 Other intervertebral disc degeneration, lumbar region: Secondary | ICD-10-CM | POA: Diagnosis not present

## 2014-08-09 MED ORDER — HYDROCODONE-ACETAMINOPHEN 5-325 MG PO TABS: ORAL_TABLET | ORAL | Status: DC

## 2014-08-09 NOTE — Progress Notes (Signed)
   Subjective:    Patient ID: [name], female    DOB: 10/01/1940, 74 y.o.   MRN: [id number]  HPI  Patient is 74 year old female [facility] evaluation and treatment of pain involving the region of the shoulder neck lower back and lower extremity regions. At the present time patient is continuing physical therapy. We discussed patient's condition and will need to observe patient's response to physical therapy treatment. Patient is also status post Botox injection of bladder and will follow-up with her urologist in this regard. We will continue present medications at this time and avoid interventional treatment as discussed. The patient was understanding and agreement with suggested treatment plan    Review of Systems     Objective:   Physical Exam  There was tenderness of the splenius capitis and occipitalis musculature regions palpation of which reproduced moderate discomfort. There was moderate tenderness of the cervical facet cervical paraspinal musculature region of moderate tennis of the trapezius levator scapula and rhomboid musculature regions. Palpation of the acromioclavicular glenohumeral joint region was with moderate tends to palpation. Patient had moderate difficulty performing the drop test. Patient appeared to be with decreased grip strength. Tinel and Phalen's maneuver were without increase of pain of significant degree. There was no crepitus of the thoracic region noted. Palpation over the lumbar paraspinal muscles region lumbar facet region was a tends to palpation of moderate degree. Lateral bending and rotation and extension and palpation of the lumbar facets reproduce moderate discomfort. There was moderate tenderness over the PSIS and PII S region. Palpation of the greater trochanteric region iliotibial band region was with mild discomfort. EHL strength appeared to be slightly decreased. No sensory deficit of dermatomal distribution  detected. There was negative clonus negative Homans. Abdomen nontender and no costovertebral maintenance noted.      Assessment & Plan:  Degenerative disc disease lumbar spine Lumbar facet syndrome  Sacroiliac joint dysfunction  Intercostal neuralgia  Cervical degenerative disc disease Status post surgical intervention cervical region  Degenerative joint disease of shoulder    Plan   Continue present medicationsHydrocodone acetaminophen  F/U PCPDr. Yates DecampJohn Walker  III for evaliation of  BP and general medical  condition.  F/U surgical evaluation  F/U neurological evaluation status post Botox injections of bladder  F/U neurological evaluation  Continue physical therapy and exercise program with caution to avoid aggravation of symptomatology   May consider radiofrequency rhizolysis or intraspinal procedures pending response to present treatment and F/U evaluation.  Patient to call [facility] should patient have concerns prior to scheduled return appointment.

## 2014-08-09 NOTE — Progress Notes (Signed)
Safety precautions to be maintained throughout the outpatient stay will include: orient to surroundings, keep bed in low position, maintain call bell within reach at all times, provide assistance with transfer out of bed and ambulation. Discharged via ambulatory at 1245 pm 

## 2014-08-09 NOTE — Patient Instructions (Addendum)
Continue present medications hydrocodone acetaminophen  F/U PCP for evaliation of  BP and general medical  condition.  F/U surgical evaluation  F/U neurological evaluation  May consider radiofrequency rhizolysis or intraspinal procedures pending response to present treatment and F/U evaluation.  Patient to call [facility] should patient have concerns prior to scheduled return appointment.   A prescription for HYDROCODONE was given to you today.

## 2014-08-10 ENCOUNTER — Encounter: Payer: Self-pay | Admitting: Physical Therapy

## 2014-08-10 ENCOUNTER — Ambulatory Visit: Payer: Medicare Other | Admitting: Physical Therapy

## 2014-08-10 DIAGNOSIS — R531 Weakness: Secondary | ICD-10-CM | POA: Diagnosis not present

## 2014-08-10 DIAGNOSIS — R269 Unspecified abnormalities of gait and mobility: Secondary | ICD-10-CM

## 2014-08-10 NOTE — Therapy (Signed)
[facility] [address] [place], [place], [address] Phone: [phone]   Fax:  [phone]  Physical Therapy Treatment  Patient Details  Name: [name] MRN: [id number] Date of Birth: 03/10/1940 Referring Provider:  Rafael BihariWalker, John B III, MD  Encounter Date: 08/10/2014      PT End of Session - 08/10/14 1308    Visit Number 9   Number of Visits 12   Date for PT Re-Evaluation 09/27/14   Authorization Type 7   Authorization Time Period 10   Equipment Utilized During Treatment Gait belt   Activity Tolerance Patient tolerated treatment well;Patient limited by fatigue   Behavior During Therapy [facility] for tasks assessed/performed      Past Medical History  Diagnosis Date  . Dysrhythmia     A-FIB  . AICD (automatic cardioverter/defibrillator) present     PLACED BY  DR  Graciela HusbandsKLEIN 2008  . Hypertension   . Angina     5-6 YRS AGO  . CHF (congestive heart failure)   . Shortness of breath     EASING OFF NOW  . Diabetes mellitus   . Headache(784.0)   . Fibromyalgia   . Arthritis   . Anxiety   . Sleep apnea     DOESN'T KNOW SETTINGS  . History of transient ischemic attack (TIA)     LAST ONE IN  2005  [place]  . Diverticulum of esophagus     large diverticulum in the middle third of the esophagus [facility] EGD '09)  . Complication of anesthesia     states at times had a tough time waking up  . TIA (transient ischemic attack)     Past Surgical History  Procedure Laterality Date  . Cesarean section      X  4  . Insert / replace / remove pacemaker      ST  JUDE  DEVICE  . Cardiac catheterization      X 2    LAST ONE  2009  . Tonsillectomy    . Appendectomy    . Tubal ligation    . Bilateral oophorectomy      BENIGN  . Anterior cervical decomp/discectomy fusion  04/09/2011    Procedure: ANTERIOR CERVICAL DECOMPRESSION/DISCECTOMY FUSION 1 LEVEL;  Surgeon: Temple PaciniHenry A Pool, MD;  Location: [facility];  Service:  Neurosurgery;  Laterality: N/A;  Cervical five-six Anterior Cervical Decompression and Fusion  . Cholecystectomy    . Joint replacement      BIL  KNEES    There were no vitals filed for this visit.  Visit Diagnosis:  Weakness  Abnormality of gait      Subjective Assessment - 08/10/14 1307    Subjective Patient reports that her pain is better today.    Limitations Walking;House hold activities;Other (comment)   Patient Stated Goals improve balance, decrease pain globally    Pain Onset More than a month ago       Neuromuscular training : Blue foam beam walking fwd/bwd, x 10 side to side x 10 x 2 standing on blue foam with head turns and trunk rotation, step ups and tapping Tandem standing on floor with head turns Patient continues to demonstrate some in coordination of movement with select exercises and loss of balance.   Therapeutic exercise: Step ups fwd and side ways Leg press 75 # x 2  Minimal cueing needed to point toe forward and keep knee straight with  PT Education - 08/10/14 1307    Education provided Yes   Person(s) Educated Patient   Methods Explanation   Comprehension Verbalized understanding             PT Long Term Goals - 07/06/14 0847    PT LONG TERM GOAL #1   Title Pt will demo increased gait speed from 0.62 m/s to >1.3 m/s in order to ambulate safely across parking lots when shopping at [place].   Time 12   Period Weeks   Status New   PT LONG TERM GOAL #2   Title Pt will score an increased score from 33.75% on ABC questionnaire to > 50% in order to demo increased confidence with balance.    Time 12   Period Weeks   Status New   PT LONG TERM GOAL #3   Title Pt will be able to demo decreased time w/ TUG from 21.28 sec to 18.28 sec while using SPC and a chair without armrests.   Time 12   Period Weeks   Status New               Plan - 08/10/14 1308    Clinical Impression Statement  Pt  reports no discomfort during and overall less joint stiffness and pain following.   Pt will benefit from skilled therapeutic intervention in order to improve on the following deficits Abnormal gait;Cardiopulmonary status limiting activity;Decreased endurance;Decreased coordination;Difficulty walking;Decreased range of motion;Decreased safety awareness;Decreased activity tolerance;Decreased balance;Decreased mobility;Decreased strength;Postural dysfunction;Improper body mechanics;Impaired flexibility;Obesity   Rehab Potential Good   Clinical Impairments Affecting Rehab Potential Multiple surgeries, chronic pain    PT Frequency 2x / week   PT Duration 12 weeks   PT Treatment/Interventions ADLs/Self Care Home Management;Aquatic Therapy;Electrical Stimulation;Biofeedback;Gait training;Neuromuscular re-education;Balance training;Therapeutic exercise;Therapeutic activities;Functional mobility training;Moist Heat;Traction;Patient/family education;Dry needling;Manual techniques;Energy conservation   PT Next Visit Plan Progress gluteal strengthening, narrow BOS training, and gait training drills (such as obstacle course)    PT Home Exercise Plan Maintained from previous session    Consulted and Agree with Plan of Care Patient        Problem List Patient Active Problem List   Diagnosis Date Noted  . 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 Piriformis syndrome 06/18/2014  . Facet syndrome, lumbar 06/18/2014  . 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 DDD (degenerative disc disease), lumbar 06/18/2014  . DDD (degenerative disc disease), cervical 06/08/2014  . 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 Cervical spinal stenosis 04/09/2011    Ezekiel Ina 08/10/2014, 1:11 PM  [place] [facility] MAIN [facility] SERVICES [address] [place], [place], [address] Phone: [phone]   Fax:  [phone]

## 2014-08-15 ENCOUNTER — Encounter: Payer: Self-pay | Admitting: Physical Therapy

## 2014-08-15 ENCOUNTER — Ambulatory Visit: Payer: Medicare Other | Admitting: Physical Therapy

## 2014-08-15 DIAGNOSIS — R531 Weakness: Secondary | ICD-10-CM | POA: Diagnosis not present

## 2014-08-15 DIAGNOSIS — R269 Unspecified abnormalities of gait and mobility: Secondary | ICD-10-CM

## 2014-08-15 NOTE — Therapy (Signed)
[place] [facility] [address] [place], [place], [address] Phone: [phone]   Fax:  [phone]  Physical Therapy Treatment  Patient Details  Name: Heather LoftySandra J Ahrendt MRN: [id number] Date of Birth: 07/19/1940 Referring Provider:  Rafael BihariWalker, John B III, MD  Encounter Date: 08/15/2014      PT End of Session - 08/15/14 1350    Visit Number 10   Number of Visits 12   Date for PT Re-Evaluation 09/27/14   Authorization Type 7   Authorization Time Period 10   PT Start Time 0130   PT Stop Time 0215   PT Time Calculation (min) 45 min   Equipment Utilized During Treatment Gait belt   Activity Tolerance Patient tolerated treatment well;Patient limited by fatigue   Behavior During Therapy [facility] for tasks assessed/performed      Past Medical History  Diagnosis Date  . Dysrhythmia     A-FIB  . AICD (automatic cardioverter/defibrillator) present     PLACED BY  DR  Graciela HusbandsKLEIN 2008  . Hypertension   . Angina     5-6 YRS AGO  . CHF (congestive heart failure)   . Shortness of breath     EASING OFF NOW  . Diabetes mellitus   . Headache(784.0)   . Fibromyalgia   . Arthritis   . Anxiety   . Sleep apnea     DOESN'T KNOW SETTINGS  . History of transient ischemic attack (TIA)     LAST ONE [place]  2005  [place]  . Diverticulum of esophagus     large diverticulum in the middle third of the esophagus [facility]GD '09)  . Complication of anesthesia     states at times had a tough time waking up  . TIA (transient ischemic attack)     Past Surgical History  Procedure Laterality Date  . Cesarean section      X  4  . Insert / replace / remove pacemaker      ST  JUDE  DEVICE  . Cardiac catheterization      X 2    LAST ONE  2009  . Tonsillectomy    . Appendectomy    . Tubal ligation    . Bilateral oophorectomy      BENIGN  . Anterior cervical decomp/discectomy fusion  04/09/2011    Procedure: ANTERIOR CERVICAL DECOMPRESSION/DISCECTOMY FUSION 1  LEVEL;  Surgeon: Temple PaciniHenry A Pool, MD;  [facility];  Service: Neurosurgery;  Laterality: N/A;  Cervical five-six Anterior Cervical Decompression and Fusion  . Cholecystectomy    . Joint replacement      BIL  KNEES    There were no vitals filed for this visit.  Visit Diagnosis:  Weakness  Abnormality of gait      Subjective Assessment - 08/15/14 1346    Subjective Patient reports that her pain is better today.    Limitations Walking;House hold activities;Other (comment)   Patient Stated Goals improve balance, decrease pain globally    Pain Score 4    Pain Location Back   Pain Onset More than a month ago   Pain Onset More than a month ago         Neuromuscular Re-education  Rhomberg stance on blue foam pad with eyes open x 30 seconds for 2 sets  Stepping on blue foam pad x 30 seconds for 2 sets without hand hold assistance  Dynamic postural balance with rhythmic stabilization 2 sets x 30  seconds (pushing patient forwards, backwards, and laterally)  Catching foam balls on blue foam pad x 30 seconds in Rhomberg stance for 2 sets. Ambulation with head turns left and right, stopping and quick turns Tandem standing with head turns Standing with tapping on cones x 10 bilaterally   Minimal cueing needed to point toe forward and keep knee straight with task.  Pt demonstrated decreased endurance with sit-to-stands exhibiting fatigue towards the end of second set with increased breathing rate Verbal cues needed to keep hips aligned with abduction                         PT Education - 08/15/14 1347    Education provided Yes   Person(s) Educated Patient   Methods Explanation   Comprehension Verbalized understanding             PT Long Term Goals - 07/06/14 0847    PT LONG TERM GOAL #1   Title Pt will demo increased gait speed from 0.62 m/s to >1.3 m/s in order to ambulate safely across parking lots when shopping at [place].   Time 12   Period  Weeks   Status New   PT LONG TERM GOAL #2   Title Pt will score an increased score from 33.75% on ABC questionnaire to > 50% in order to demo increased confidence with balance.    Time 12   Period Weeks   Status New   PT LONG TERM GOAL #3   Title Pt will be able to demo decreased time w/ TUG from 21.28 sec to 18.28 sec while using SPC and a chair without armrests.   Time 12   Period Weeks   Status New               Problem List Patient Active Problem List   Diagnosis Date Noted  . 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 Piriformis syndrome 06/18/2014  . Facet syndrome, lumbar 06/18/2014  . 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 DDD (degenerative disc disease), lumbar 06/18/2014  . DDD (degenerative disc disease), cervical 06/08/2014  . 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 Cervical spinal stenosis 04/09/2011    Ezekiel Ina 08/15/2014, 1:54 PM  [place] [facility] MAIN [facility] SERVICES [address] [place], [place], [address] Phone: [phone]   Fax:  [phone]

## 2014-08-17 ENCOUNTER — Ambulatory Visit: Payer: Medicare Other | Admitting: Physical Therapy

## 2014-08-17 ENCOUNTER — Encounter: Payer: Self-pay | Admitting: Physical Therapy

## 2014-08-17 DIAGNOSIS — R269 Unspecified abnormalities of gait and mobility: Secondary | ICD-10-CM

## 2014-08-17 DIAGNOSIS — R531 Weakness: Secondary | ICD-10-CM

## 2014-08-17 NOTE — Therapy (Signed)
[facility] MAIN [facility] SERVICES [address] [place], [place], [address] Phone: [phone]   Fax:  [phone]  Physical Therapy Treatment  Patient Details  Name: [name] MRN: [id number] Date of Birth: Aug 14, 1940 Referring Provider:  Rafael Bihari, MD  Encounter Date: 08/17/2014      PT End of Session - 08/17/14 1325    Visit Number 11   Number of Visits 12   Date for PT Re-Evaluation 09/27/14   Authorization Type 7   Authorization Time Period 10   PT Start Time 0100   PT Stop Time 0140   PT Time Calculation (min) 40 min   Equipment Utilized During Treatment Gait belt   Activity Tolerance Patient tolerated treatment well;Patient limited by fatigue   Behavior During Therapy [facility] for tasks assessed/performed      Past Medical History  Diagnosis Date  . Dysrhythmia     A-FIB  . AICD (automatic cardioverter/defibrillator) present     PLACED BY  DR  Graciela Husbands 2008  . Hypertension   . Angina     5-6 YRS AGO  . CHF (congestive heart failure)   . Shortness of breath     EASING OFF NOW  . Diabetes mellitus   . Headache(784.0)   . Fibromyalgia   . Arthritis   . Anxiety   . Sleep apnea     DOESN'T KNOW SETTINGS  . History of transient ischemic attack (TIA)     LAST ONE IN  2005  IN  [place], [place]  . Diverticulum of esophagus     large diverticulum in the middle third of the esophagus [facility] EGD '09)  . Complication of anesthesia     states at times had a tough time waking up  . TIA (transient ischemic attack)     Past Surgical History  Procedure Laterality Date  . Cesarean section      X  4  . Insert / replace / remove pacemaker      ST  JUDE  DEVICE  . Cardiac catheterization      X 2    LAST ONE  2009  . Tonsillectomy    . Appendectomy    . Tubal ligation    . Bilateral oophorectomy      BENIGN  . Anterior cervical decomp/discectomy fusion  04/09/2011    Procedure: ANTERIOR CERVICAL DECOMPRESSION/DISCECTOMY FUSION 1  LEVEL;  Surgeon: Temple Pacini, MD;  Location: [facility];  Service: Neurosurgery;  Laterality: N/A;  Cervical five-six Anterior Cervical Decompression and Fusion  . Cholecystectomy    . Joint replacement      BIL  KNEES    There were no vitals filed for this visit.  Visit Diagnosis:  Weakness  Abnormality of gait      Subjective Assessment - 08/17/14 1323    Subjective Patient reports that her pain is worse becuase she just took her pain medicine.   Limitations Walking;House hold activities;Other (comment)   Patient Stated Goals improve balance, decrease pain globally    Pain Score 7    Pain Location Back   Pain Onset More than a month ago   Pain Onset More than a month ago        Neuromuscular training: Fwd/bwd stepping on blue foam, side stepping left and right Tapping from blue foam to stool and step ups from blue foam to stool Step ups from floor to stool left and right Gait training without use of UE  in parallel bars 20 feet x 10 without loss of balance Head turns with tandem standing Head turns with feet together Gait training with head turns left and right Leg press x 20 x 2 with 75 lbs. 4 way hip flex x 20 x 2 Verbal cues needed to keep hips aligned with abduction.                          PT Education - 08/17/14 1325    Education provided Yes   Education Details progressed with HEP   Person(s) Educated Patient   Methods Explanation   Comprehension Verbalized understanding             PT Long Term Goals - 07/06/14 0847    PT LONG TERM GOAL #1   Title Pt will demo increased gait speed from 0.62 m/s to >1.3 m/s in order to ambulate safely across parking lots [place] [employer].   Time 12   Period Weeks   Status New   PT LONG TERM GOAL #2   Title Pt will score an increased score from 33.75% on ABC questionnaire to > 50% in order to demo increased confidence with balance.    Time 12   Period Weeks   Status New   PT LONG  TERM GOAL #3   Title Pt will be able to demo decreased time w/ TUG from 21.28 sec to 18.28 sec while using SPC and a chair without armrests.   Time 12   Period Weeks   Status New               Plan - 08/17/14 1326    Clinical Impression Statement Patient is having weakness and poor dynamic standing balance.    Pt will benefit from skilled therapeutic intervention in order to improve on the following deficits Abnormal gait;Cardiopulmonary status limiting activity;Decreased endurance;Decreased coordination;Difficulty walking;Decreased range of motion;Decreased safety awareness;Decreased activity tolerance;Decreased balance;Decreased mobility;Decreased strength;Postural dysfunction;Improper body mechanics;Impaired flexibility;Obesity   Rehab Potential Good   Clinical Impairments Affecting Rehab Potential Multiple surgeries, chronic pain    PT Frequency 2x / week   PT Duration 12 weeks   PT Treatment/Interventions ADLs/Self Care Home Management;Aquatic Therapy;Electrical Stimulation;Biofeedback;Gait training;Neuromuscular re-education;Balance training;Therapeutic exercise;Therapeutic activities;Functional mobility training;Moist Heat;Traction;Patient/family education;Dry needling;Manual techniques;Energy conservation   PT Next Visit Plan Progress gluteal strengthening, narrow BOS training, and gait training drills (such as obstacle course)    PT Home Exercise Plan Maintained from previous session    Consulted and Agree with Plan of Care Patient          G-Codes - 08/16/14 1216    Functional Assessment Tool Used ABC and gait speed   Functional Limitation Mobility: Walking and moving around   Mobility: Walking and Moving Around Current Status [phone]([id number]) At least 60 percent but less than 80 percent impaired, limited or restricted   Mobility: Walking and Moving Around Goal Status [phone]) At least 40 percent but less than 60 percent impaired, limited or restricted      Problem  List Patient Active Problem List   Diagnosis Date Noted  . 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 Piriformis syndrome 06/18/2014  . Facet syndrome, lumbar 06/18/2014  . 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 DDD (degenerative disc disease), lumbar 06/18/2014  . DDD (degenerative disc disease), cervical 06/08/2014  . 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 Cervical spinal stenosis 04/09/2011    Jones BroomMansfield, Minetta Krisher S 08/17/2014, 1:27 PM  [place] MAIN [facility] SERVICES [address] [place], [place], [address] Phone: [phone]   Fax:  [phone]

## 2014-08-23 ENCOUNTER — Encounter: Payer: Medicare Other | Admitting: Physical Therapy

## 2014-08-28 ENCOUNTER — Encounter: Payer: Self-pay | Admitting: Physical Therapy

## 2014-08-28 ENCOUNTER — Ambulatory Visit: Payer: Medicare Other | Admitting: Physical Therapy

## 2014-08-28 DIAGNOSIS — R269 Unspecified abnormalities of gait and mobility: Secondary | ICD-10-CM

## 2014-08-28 DIAGNOSIS — R531 Weakness: Secondary | ICD-10-CM | POA: Diagnosis not present

## 2014-08-28 NOTE — Therapy (Signed)
[facility] MAIN [facility] SERVICES [address] [place], [place], [address] Phone: [phone]   Fax:  [phone]  Physical Therapy Treatment  Patient Details  Name: [name] MRN: [id number] Date of Birth: 1940-08-19 Referring Provider:  Morene Crocker, MD  Encounter Date: 08/28/2014      PT End of Session - 08/28/14 1617    Visit Number 12   Number of Visits 24   Date for PT Re-Evaluation 09/27/14   Authorization Type 3   Authorization Time Period 10   PT Start Time 1522   PT Stop Time 1608   PT Time Calculation (min) 46 min   Equipment Utilized During Treatment Gait belt   Activity Tolerance Patient tolerated treatment well;Patient limited by fatigue   Behavior During Therapy [facility] for tasks assessed/performed      Past Medical History  Diagnosis Date  . Dysrhythmia     A-FIB  . AICD (automatic cardioverter/defibrillator) present     PLACED BY  DR  Graciela Husbands 2008  . Hypertension   . Angina     5-6 YRS AGO  . CHF (congestive heart failure)   . Shortness of breath     EASING OFF NOW  . Diabetes mellitus   . Headache(784.0)   . Fibromyalgia   . Arthritis   . Anxiety   . Sleep apnea     DOESN'T KNOW SETTINGS  . History of transient ischemic attack (TIA)     LAST ONE IN  2005  IN  [place], [place]  . Diverticulum of esophagus     large diverticulum in the middle third of the esophagus [facility] EGD '09)  . Complication of anesthesia     states at times had a tough time waking up  . TIA (transient ischemic attack)     Past Surgical History  Procedure Laterality Date  . Cesarean section      X  4  . Insert / replace / remove pacemaker      ST  JUDE  DEVICE  . Cardiac catheterization      X 2    LAST ONE  2009  . Tonsillectomy    . Appendectomy    . Tubal ligation    . Bilateral oophorectomy      BENIGN  . Anterior cervical decomp/discectomy fusion  04/09/2011    Procedure: ANTERIOR CERVICAL DECOMPRESSION/DISCECTOMY FUSION 1  LEVEL;  Surgeon: Temple Pacini, MD;  Location: [facility];  Service: Neurosurgery;  Laterality: N/A;  Cervical five-six Anterior Cervical Decompression and Fusion  . Cholecystectomy    . Joint replacement      BIL  KNEES    There were no vitals filed for this visit.  Visit Diagnosis:  Abnormality of gait  Weakness      Subjective Assessment - 08/28/14 1527    Subjective Patient reports that she has low back pain 5/10. Reports that she took some pain medication with no effect on pain level. Patient reports that she hurt her back last Thursday bending down to pick up a 10lb bag of sugar 9/10 at time of incident.   Limitations Walking;House hold activities;Other (comment)   Patient Stated Goals improve balance, decrease pain globally    Currently in Pain? Yes   Pain Score 6    Pain Location Back   Pain Orientation Lower;Medial   Pain Descriptors / Indicators Constant;Aching   Pain Type Chronic pain   Pain Onset More than a month ago  Pain Frequency Constant   Aggravating Factors  walking    Pain Relieving Factors rest   Multiple Pain Sites No   Pain Onset More than a month ago       Treatment:   Nustep level 2, 5 minutes (unbilled)   Ball pass with trunk rotations standing on airex, 2x 15  Airex ball raise /lower 2x15 (patient unable to perform head nods and maintain balance)  PT provided CGA, and verbal instruction to increase weight shift, decrease lumbar flexion/extension with ball raise, and for improved erect posture.     Toe taps on 6 inch step from airex, no HHA 2x10 Toe taps on 6 inch cone, no HHA 2x10  PT provided CGA, tactile cues to increase weight shift, verbal instruction for ankle strategy to prevent lateral LOB.   Sit <>stand x8, x5 CGA cues proper breathing with sit to stand motion.   tandem walk on airex beams x3 laps (occasional 1 HHA.) Side stepping on Airex beam x2 laps. No HHA.    Cues for decreased use of UE, increased step length with side  stepping and hip strategy to correct  Posterior LOB.                    PT Education - 08/28/14 1617    Education provided Yes   Education Details balance training    Person(s) Educated Patient   Methods Explanation;Demonstration;Tactile cues;Verbal cues   Comprehension Verbalized understanding;Returned demonstration;Verbal cues required;Tactile cues required             PT Long Term Goals - 07/06/14 0847    PT LONG TERM GOAL #1   Title Pt will demo increased gait speed from 0.62 m/s to >1.3 m/s in order to ambulate safely across parking lots when shopping at [place].   Time 12   Period Weeks   Status New   PT LONG TERM GOAL #2   Title Pt will score an increased score from 33.75% on ABC questionnaire to > 50% in order to demo increased confidence with balance.    Time 12   Period Weeks   Status New   PT LONG TERM GOAL #3   Title Pt will be able to demo decreased time w/ TUG from 21.28 sec to 18.28 sec while using SPC and a chair without armrests.   Time 12   Period Weeks   Status New               Plan - 08/28/14 1618    Clinical Impression Statement Patient reports that she hurt her back last week attempting to pick up 10lb bag of sugar; pain reported upon arrival 5/10. Patient instructed in balance training on this day; PT provided CGA for all balance activities and min verbal/tactile instruction for improved posture and increased use of hip strategy to correct LOB, Patient responded well to instruction. Difficulty noted with balance exercise with head nods tandem stance. Continued skilled PT is recommended to increase LE strength, improve balance and reduce pain.   Pt will benefit from skilled therapeutic intervention in order to improve on the following deficits Abnormal gait;Cardiopulmonary status limiting activity;Decreased endurance;Decreased coordination;Difficulty walking;Decreased range of motion;Decreased safety awareness;Decreased activity  tolerance;Decreased balance;Decreased mobility;Decreased strength;Postural dysfunction;Improper body mechanics;Impaired flexibility;Obesity   Rehab Potential Good   Clinical Impairments Affecting Rehab Potential Multiple surgeries, chronic pain    PT Frequency 2x / week   PT Duration 12 weeks   PT Treatment/Interventions ADLs/Self Care Home Management;Aquatic Therapy;Electrical Stimulation;Biofeedback;Gait training;Neuromuscular  re-education;Balance training;Therapeutic exercise;Therapeutic activities;Functional mobility training;Moist Heat;Traction;Patient/family education;Dry needling;Manual techniques;Energy conservation   PT Next Visit Plan Progress gluteal strengthening, narrow BOS training, and dynamic gait training.    PT Home Exercise Plan continue as previously given    Consulted and Agree with Plan of Care Patient        Problem List Patient Active Problem List   Diagnosis Date Noted  . 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 Piriformis syndrome 06/18/2014  . Facet syndrome, lumbar 06/18/2014  . 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 DDD (degenerative disc disease), lumbar 06/18/2014  . DDD (degenerative disc disease), cervical 06/08/2014  . 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 Cervical spinal stenosis 04/09/2011   Grier Rocher SPT 08/28/2014   4:34 PM  This entire session was performed under direct supervision and direction of a licensed therapist . I have personally read, edited and approve of the note as written.  Hopkins,Margaret 08/28/2014, 4:34 PM  [place] [facility] MAIN [facility] SERVICES [address] [place], [place], [address] Phone: [phone]   Fax:  [phone]

## 2014-08-30 ENCOUNTER — Ambulatory Visit: Payer: Medicare Other | Admitting: Physical Therapy

## 2014-08-30 DIAGNOSIS — R269 Unspecified abnormalities of gait and mobility: Secondary | ICD-10-CM

## 2014-08-30 DIAGNOSIS — R531 Weakness: Secondary | ICD-10-CM | POA: Diagnosis not present

## 2014-08-30 NOTE — Therapy (Signed)
[facility] MAIN [facility] SERVICES [address] [place], [place], [address] Phone: [phone]   Fax:  [phone]  Physical Therapy Treatment  Patient Details  Name: [name] MRN: [id number] Date of Birth: 1940/09/07 Referring Provider:  Morene Crocker, MD  Encounter Date: 08/30/2014      PT End of Session - 08/30/14 1401    Visit Number 13   Number of Visits 24   Date for PT Re-Evaluation 09/27/14   Authorization Type 3   Authorization Time Period 10   PT Start Time 1302   PT Stop Time 1346   PT Time Calculation (min) 44 min   Equipment Utilized During Treatment Gait belt   Activity Tolerance Patient tolerated treatment well;Patient limited by fatigue   Behavior During Therapy [facility] for tasks assessed/performed      Past Medical History  Diagnosis Date  . Dysrhythmia     A-FIB  . AICD (automatic cardioverter/defibrillator) present     PLACED BY  DR  Graciela Husbands 2008  . Hypertension   . Angina     5-6 YRS AGO  . CHF (congestive heart failure)   . Shortness of breath     EASING OFF NOW  . Diabetes mellitus   . Headache(784.0)   . Fibromyalgia   . Arthritis   . Anxiety   . Sleep apnea     DOESN'T KNOW SETTINGS  . History of transient ischemic attack (TIA)     LAST ONE IN  2005  IN  [place], [place]  . Diverticulum of esophagus     large diverticulum in the middle third of the esophagus [facility] EGD '09)  . Complication of anesthesia     states at times had a tough time waking up  . TIA (transient ischemic attack)     Past Surgical History  Procedure Laterality Date  . Cesarean section      X  4  . Insert / replace / remove pacemaker      ST  JUDE  DEVICE  . Cardiac catheterization      X 2    LAST ONE  2009  . Tonsillectomy    . Appendectomy    . Tubal ligation    . Bilateral oophorectomy      BENIGN  . Anterior cervical decomp/discectomy fusion  04/09/2011    Procedure: ANTERIOR CERVICAL DECOMPRESSION/DISCECTOMY FUSION 1  LEVEL;  Surgeon: Temple Pacini, MD;  Location: [facility];  Service: Neurosurgery;  Laterality: N/A;  Cervical five-six Anterior Cervical Decompression and Fusion  . Cholecystectomy    . Joint replacement      BIL  KNEES    There were no vitals filed for this visit.  Visit Diagnosis:  Weakness  Abnormality of gait      Subjective Assessment - 08/30/14 1356    Subjective Pt continues to c/o LBP.  She rates her pain as a 5/10 on 0-10 pain scale today.   Limitations Walking;House hold activities;Other (comment)   Patient Stated Goals improve balance, decrease pain globally    Pain Score 5    Pain Location Back   Pain Onset More than a month ago   Pain Onset More than a month ago        NMR Training: Tandem stance in // bars without UE support; narrow BOS on blue foam pad with EO and EC; ambulation in carpeted hallway with [place] and head turns and SBA/CGA x1; lunges onto blue foam pad in //  bars; balancing on blue half foam roller in // bars; side stepping on blue foam half roller in // bars without UE support.  Therapeutic Ex: Sit to Stands from large gray mat table with no use of hands 2x7 with rest break; Leg press machine -- knee extension 75# 2x20 and heel raises 2x20 with 75#.                                PT Long Term Goals - 07/06/14 0847    PT LONG TERM GOAL #1   Title Pt will demo increased gait speed from 0.62 m/s to >1.3 m/s in order to ambulate safely across parking lots when shopping at [place].   Time 12   Period Weeks   Status New   PT LONG TERM GOAL #2   Title Pt will score an increased score from 33.75% on ABC questionnaire to > 50% in order to demo increased confidence with balance.    Time 12   Period Weeks   Status New   PT LONG TERM GOAL #3   Title Pt will be able to demo decreased time w/ TUG from 21.28 sec to 18.28 sec while using SPC and a chair without armrests.   Time 12   Period Weeks   Status New                Plan - 08/30/14 1402    Clinical Impression Statement Pt tolerated treatment well today.  She did exhibit some DOE with ambulation in hallway today and needed seated rest breaks.   PT Next Visit Plan balance training; LE strengthening        Problem List Patient Active Problem List   Diagnosis Date Noted  . 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 Piriformis syndrome 06/18/2014  . Facet syndrome, lumbar 06/18/2014  . 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 DDD (degenerative disc disease), lumbar 06/18/2014  . DDD (degenerative disc disease), cervical 06/08/2014  . 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 Cervical spinal stenosis 04/09/2011    Tayte Mcwherter, MPT 08/30/2014, 2:08 PM  [place] [facility] MAIN [facility] SERVICES [address] [place], [place], [address] Phone: [phone]   Fax:  [phone]

## 2014-09-18 ENCOUNTER — Encounter: Payer: Medicare Other | Admitting: Physical Therapy

## 2014-09-19 ENCOUNTER — Encounter: Payer: Self-pay | Admitting: Pain Medicine

## 2014-09-19 ENCOUNTER — Encounter: Payer: Medicare Other | Admitting: Pain Medicine

## 2014-09-19 ENCOUNTER — Ambulatory Visit: Payer: Medicare Other | Attending: Pain Medicine | Admitting: Pain Medicine

## 2014-09-19 VITALS — BP 151/61 | HR 66 | Temp 98.9°F | Resp 20 | Ht 64.0 in | Wt 247.0 lb

## 2014-09-19 DIAGNOSIS — Z9889 Other specified postprocedural states: Secondary | ICD-10-CM | POA: Diagnosis not present

## 2014-09-19 DIAGNOSIS — M542 Cervicalgia: Secondary | ICD-10-CM | POA: Diagnosis present

## 2014-09-19 DIAGNOSIS — M79602 Pain in left arm: Secondary | ICD-10-CM | POA: Diagnosis present

## 2014-09-19 DIAGNOSIS — M533 Sacrococcygeal disorders, not elsewhere classified: Secondary | ICD-10-CM | POA: Diagnosis not present

## 2014-09-19 DIAGNOSIS — M19011 Primary osteoarthritis, right shoulder: Secondary | ICD-10-CM

## 2014-09-19 DIAGNOSIS — M47816 Spondylosis without myelopathy or radiculopathy, lumbar region: Secondary | ICD-10-CM

## 2014-09-19 DIAGNOSIS — G588 Other specified mononeuropathies: Secondary | ICD-10-CM

## 2014-09-19 DIAGNOSIS — M19019 Primary osteoarthritis, unspecified shoulder: Secondary | ICD-10-CM | POA: Diagnosis not present

## 2014-09-19 DIAGNOSIS — M19012 Primary osteoarthritis, left shoulder: Secondary | ICD-10-CM

## 2014-09-19 DIAGNOSIS — M79601 Pain in right arm: Secondary | ICD-10-CM | POA: Diagnosis present

## 2014-09-19 DIAGNOSIS — M5136 Other intervertebral disc degeneration, lumbar region: Secondary | ICD-10-CM | POA: Insufficient documentation

## 2014-09-19 DIAGNOSIS — M5030 Other cervical disc degeneration, unspecified cervical region: Secondary | ICD-10-CM | POA: Insufficient documentation

## 2014-09-19 DIAGNOSIS — G5700 Lesion of sciatic nerve, unspecified lower limb: Secondary | ICD-10-CM

## 2014-09-19 DIAGNOSIS — M5134 Other intervertebral disc degeneration, thoracic region: Secondary | ICD-10-CM

## 2014-09-19 MED ORDER — HYDROCODONE-ACETAMINOPHEN 5-325 MG PO TABS: ORAL_TABLET | ORAL | Status: DC

## 2014-09-19 NOTE — Progress Notes (Signed)
   Subjective:    Patient ID: [name], female    DOB: Nov 13, 1940, 74 y.o.   MRN: [id number]  HPI  Patient 74 year old female [facility] further evaluation and treatment of pain involving the neck upper extremity regions especially the right shoulder as well as the upper mid lower back and lower extremity regions. Her pain is present time involves the region of the shoulder. She has undergone surgical evaluation without recommendations for surgical intervention. We have discussed patient's condition and remain available to consider patient for interventional treatment should there be significant change in patient's condition. Patient also admitted to some lower extremity swelling as well and we will have patient follow-up with Dr. Yates Decamp the third at this time for further evaluation and treatment of her general medical condition. The patient was understanding and in agreement with suggested treatment plan.    Review of Systems     Objective:   Physical Exam  There was tenderness over the splenius capitis and occipitalis musculature region of moderate degree with moderate tends to palpation of the acromioclavicular glenohumeral joint region especially on the right. Patient appeared to be with's maneuver. Patient was requested decreased grip strength on the right compared to the left. Tinel and Phalen's maneuver were without increase of pain of significant degree. Palpation over the region of the thoracic facet thoracic paraspinal musculature region was a tends to palpation with no crepitus of the thoracic region noted. There was evidence of muscle spasms of the thoracic musculature region. Spinal motion lumbar facet region was a tends to palpation of moderate degree. Moderate tenderness over the PSIS PII S region as well as the gluteal and piriformis musculature regions. Leg raising was tolerates approximately 30 without a definite increased pain with dorsiflexion  noted. His appeared to be 1+ edema of the lower extremities. There was negative clonus negative Homans. There was without tends to palpation and no costovertebral angle tenderness was noted.    Assessment & Plan:    Degenerative disc disease lumbar spine Lumbar facet syndrome  Sacroiliac joint dysfunction  Intercostal neuralgia  Cervical degenerative disc disease Status post surgical intervention cervical region  Degenerative joint disease of shoulder    Plan   Continue present medications hydrocodone acetaminophen   F/U PCP Dr. Yates Decamp III  for evaliation of  BP and general medical  Condition including lower extremity swelling  F/U surgical evaluationDr. Dutch Quint as needed  F/U neurological evaluation  May consider radiofrequency rhizolysis or intraspinal procedures pending response to present treatment and F/U evaluation   Patient to call [facility] should patient have concerns prior to scheduled return appointmen.

## 2014-09-19 NOTE — Patient Instructions (Signed)
Continue present medications hydrocodone acetaminophen  F/U PCP Dr. Yates Decamp III for evaliation of  BP and general medical  condition  F/U surgical evaluation as discussed  F/U neurological evaluation  May consider radiofrequency rhizolysis or intraspinal procedures pending response to present treatment and F/U evaluation   Patient to call [facility] should patient have concerns prior to scheduled return appointmen.

## 2014-09-19 NOTE — Progress Notes (Signed)
Safety precautions to be maintained throughout the outpatient stay will include: orient to surroundings, keep bed in low position, maintain call bell within reach at all times, provide assistance with transfer out of bed and ambulation.  

## 2014-09-20 ENCOUNTER — Ambulatory Visit: Payer: Medicare Other | Attending: Neurology | Admitting: Physical Therapy

## 2014-09-20 ENCOUNTER — Encounter: Payer: Self-pay | Admitting: Physical Therapy

## 2014-09-20 DIAGNOSIS — R269 Unspecified abnormalities of gait and mobility: Secondary | ICD-10-CM

## 2014-09-20 DIAGNOSIS — R531 Weakness: Secondary | ICD-10-CM | POA: Insufficient documentation

## 2014-09-20 NOTE — Therapy (Signed)
[place] MA[facility] SERVICES [address] [place], [place], [address] Phone: [phone]   Fax:  [phone]  Physical Therapy Treatment/DC Summary  Patient Details  Name: [name] MRN: [id number] Date of Birth: 1940-02-21 Referring Provider:  Anabel Bene, MD  Encounter Date: 09/20/2014      PT End of Session - 09/20/14 1332    Visit Number 14   Number of Visits 24   Date for PT Re-Evaluation 09/27/14   Authorization Type 3   Authorization Time Period 10   PT Start Time 0100   PT Stop Time 0145   PT Time Calculation (min) 45 min   Equipment Utilized During Treatment Gait belt   Activity Tolerance Patient tolerated treatment well;Patient limited by fatigue   Behavior During Therapy [facility] for tasks assessed/performed      Past Medical History  Diagnosis Date  . Dysrhythmia     A-FIB  . AICD (automatic cardioverter/defibrillator) present     PLACED BY  DR  KLEIN 2008  . Hypertension   . Angina     5-6 YRS AGO  . CHF (congestive heart failure)   . Shortness of breath     EASING OFF NOW  . Diabetes mellitus   . Headache(784.0)   . Fibromyalgia   . Arthritis   . Anxiety   . Sleep apnea     DOESN'T KNOW SETTINGS  . History of transient ischemic attack (TIA)     LAST ONE IN  2005  IN  [place], [place]  . Diverticulum of esophagus     large diverticulum in the middle third of the esophagus [facility] EGD '09)  . Complication of anesthesia     states at times had a tough time waking up  . TIA (transient ischemic attack)     Past Surgical History  Procedure Laterality Date  . Cesarean section      X  4  . Insert / replace / remove pacemaker      ST  [place]  . Cardiac catheterization      X 2    LAST ONE  2009  . Tonsillectomy    . Appendectomy    . Tubal ligation    . Bilateral oophorectomy      BENIGN  . Anterior cervical decomp/discectomy fusion  04/09/2011    Procedure: ANTERIOR CERVICAL  DECOMPRESSION/DISCECTOMY FUSION 1 LEVEL;  Surgeon: Charlie Pitter, MD;  Location: [facility];  Service: Neurosurgery;  Laterality: N/A;  Cervical five-six Anterior Cervical Decompression and Fusion  . Cholecystectomy    . Joint replacement      BIL  KNEES    There were no vitals filed for this visit.  Visit Diagnosis:  Weakness  Abnormality of gait      Subjective Assessment - 09/20/14 1330    Subjective Patient is having LBP 5/10.  She is having leg swelling.    Currently in Pain? Yes   Pain Score 5    Pain Onset More than a month ago        There-Ex Lower trunk rotations x 12 bilaterally for 2 sets  Supine bridging x 8 repetitions for 2 sets  Piriformis stretching in supine x 10 repetitions with 3 second holds x 1 set    Neuromuscular Re-education  Marching in place on blue foam pad x 30 seconds for 2 sets  Tandem standing in // bars  Step ups to blue foam pad x 12 bilaterally  for 2 sets bilaterally  Heel raises from 2 feet transitioning to TTWB on one foot. X 12 bilaterally for 1 set Sit to stand training x5 repetitions for 3 sets                          PT Education - 09/20/14 1332    Education provided Yes   Person(s) Educated Patient   Methods Explanation   Comprehension Verbalized understanding             PT Long Term Goals - 09/20/14 1351    PT LONG TERM GOAL #1   Status Partially Met   PT LONG TERM GOAL #2   Status Partially Met   PT LONG TERM GOAL #3   Status Partially Met               Plan - 09/20/14 1333    Clinical Impression Statement Patient continues to have LBP and is independent with HEP.  She is going to be DC for therapy today.   Pt will benefit from skilled therapeutic intervention in order to improve on the following deficits Abnormal gait;Cardiopulmonary status limiting activity;Decreased endurance;Decreased coordination;Difficulty walking;Decreased range of motion;Decreased safety  awareness;Decreased activity tolerance;Decreased balance;Decreased mobility;Decreased strength;Postural dysfunction;Improper body mechanics;Impaired flexibility;Obesity   Rehab Potential Good   PT Frequency 2x / week   PT Duration 12 weeks   PT Treatment/Interventions ADLs/Self Care Home Management;Aquatic Therapy;Electrical Stimulation;Biofeedback;Gait training;Neuromuscular re-education;Balance training;Therapeutic exercise;Therapeutic activities;Functional mobility training;Moist Heat;Traction;Patient/family education;Dry needling;Manual techniques;Energy conservation   PT Next Visit Plan balance training; LE strengthening   PT Home Exercise Plan continue as previously given    Consulted and Agree with Plan of Care Patient        Problem List Patient Active Problem List   Diagnosis Date Noted  . 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 Piriformis syndrome 06/18/2014  . Facet syndrome, lumbar 06/18/2014  . 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 DDD (degenerative disc disease), lumbar 06/18/2014  . DDD (degenerative disc disease), cervical 06/08/2014  . 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 Cervical spinal stenosis 04/09/2011    Alanson Puls 09/20/2014, 1:55 PM  [place] MAIN [facility] SERVICES [address] [place], [place], [address] Phone: [phone]   Fax:  [phone]

## 2014-10-05 ENCOUNTER — Other Ambulatory Visit: Payer: Self-pay | Admitting: Pain Medicine

## 2014-10-19 ENCOUNTER — Encounter: Payer: Medicare Other | Admitting: Pain Medicine

## 2014-11-23 DIAGNOSIS — I340 Nonrheumatic mitral (valve) insufficiency: Secondary | ICD-10-CM | POA: Insufficient documentation

## 2014-11-25 DIAGNOSIS — E538 Deficiency of other specified B group vitamins: Secondary | ICD-10-CM | POA: Insufficient documentation

## 2014-11-25 DIAGNOSIS — F32A Depression, unspecified: Secondary | ICD-10-CM | POA: Diagnosis present

## 2015-07-06 DIAGNOSIS — M810 Age-related osteoporosis without current pathological fracture: Secondary | ICD-10-CM | POA: Diagnosis not present

## 2015-07-23 DIAGNOSIS — Z7901 Long term (current) use of anticoagulants: Secondary | ICD-10-CM | POA: Diagnosis not present

## 2015-08-13 DIAGNOSIS — Z7901 Long term (current) use of anticoagulants: Secondary | ICD-10-CM | POA: Diagnosis not present

## 2015-09-13 DIAGNOSIS — E119 Type 2 diabetes mellitus without complications: Secondary | ICD-10-CM | POA: Diagnosis not present

## 2015-09-13 DIAGNOSIS — Z7901 Long term (current) use of anticoagulants: Secondary | ICD-10-CM | POA: Diagnosis not present

## 2015-09-20 DIAGNOSIS — R32 Unspecified urinary incontinence: Secondary | ICD-10-CM | POA: Diagnosis not present

## 2015-09-20 DIAGNOSIS — I2510 Atherosclerotic heart disease of native coronary artery without angina pectoris: Secondary | ICD-10-CM | POA: Diagnosis not present

## 2015-09-20 DIAGNOSIS — R1013 Epigastric pain: Secondary | ICD-10-CM | POA: Diagnosis not present

## 2015-09-20 DIAGNOSIS — I482 Chronic atrial fibrillation: Secondary | ICD-10-CM | POA: Diagnosis not present

## 2015-09-20 DIAGNOSIS — I10 Essential (primary) hypertension: Secondary | ICD-10-CM | POA: Diagnosis not present

## 2015-09-20 DIAGNOSIS — M545 Low back pain: Secondary | ICD-10-CM | POA: Diagnosis not present

## 2015-09-20 DIAGNOSIS — E119 Type 2 diabetes mellitus without complications: Secondary | ICD-10-CM | POA: Diagnosis not present

## 2015-10-18 DIAGNOSIS — I482 Chronic atrial fibrillation: Secondary | ICD-10-CM | POA: Diagnosis not present

## 2015-10-19 ENCOUNTER — Ambulatory Visit: Payer: Self-pay | Admitting: Urology

## 2015-10-19 ENCOUNTER — Encounter: Payer: Self-pay | Admitting: Urology

## 2015-11-06 DIAGNOSIS — Z7901 Long term (current) use of anticoagulants: Secondary | ICD-10-CM | POA: Diagnosis not present

## 2015-11-06 DIAGNOSIS — R102 Pelvic and perineal pain: Secondary | ICD-10-CM | POA: Diagnosis not present

## 2015-11-06 DIAGNOSIS — R198 Other specified symptoms and signs involving the digestive system and abdomen: Secondary | ICD-10-CM | POA: Diagnosis not present

## 2015-11-09 ENCOUNTER — Ambulatory Visit: Payer: Medicare Other | Admitting: Urology

## 2015-11-13 ENCOUNTER — Ambulatory Visit (INDEPENDENT_AMBULATORY_CARE_PROVIDER_SITE_OTHER): Payer: PRIVATE HEALTH INSURANCE | Admitting: Urology

## 2015-11-13 ENCOUNTER — Encounter: Payer: Self-pay | Admitting: Urology

## 2015-11-13 VITALS — BP 160/77 | HR 71 | Ht 62.0 in | Wt 264.2 lb

## 2015-11-13 DIAGNOSIS — R3129 Other microscopic hematuria: Secondary | ICD-10-CM

## 2015-11-13 DIAGNOSIS — R8271 Bacteriuria: Secondary | ICD-10-CM

## 2015-11-13 DIAGNOSIS — R351 Nocturia: Secondary | ICD-10-CM

## 2015-11-13 DIAGNOSIS — N952 Postmenopausal atrophic vaginitis: Secondary | ICD-10-CM

## 2015-11-13 DIAGNOSIS — N3946 Mixed incontinence: Secondary | ICD-10-CM | POA: Diagnosis not present

## 2015-11-13 DIAGNOSIS — R32 Unspecified urinary incontinence: Secondary | ICD-10-CM

## 2015-11-13 LAB — BLADDER SCAN AMB NON-IMAGING: SCAN RESULT: 56

## 2015-11-13 MED ORDER — ESTRADIOL 0.1 MG/GM VA CREA: TOPICAL_CREAM | VAGINAL | 12 refills | Status: DC

## 2015-11-13 MED ORDER — ESTROGENS, CONJUGATED 0.625 MG/GM VA CREA: 1.0000 | TOPICAL_CREAM | Freq: Every day | VAGINAL | 12 refills | Status: DC

## 2015-11-13 MED ORDER — FESOTERODINE FUMARATE ER 8 MG PO TB24: 8.0000 mg | ORAL_TABLET | Freq: Every day | ORAL | 4 refills | Status: DC

## 2015-11-13 NOTE — Patient Instructions (Signed)
I have given you two prescriptions for a vaginal estrogen cream.  Estrace and Premarin.  Please take these to your pharmacy and see which one your insurance covers.  If both are too expensive, please call the office at [phone] for an alternative.  You are given a sample of vaginal estrogen cream (Premarin) and instructed to apply 0.5mg (pea-sized amount)  just inside the vaginal introitus with a finger-tip every night for two weeks.     

## 2015-11-13 NOTE — Progress Notes (Signed)
11/13/2015 9:17 AM   [name] [birth year] [id number]  Referring provider: Rafael Bihari, MD [phone] [facility] [address] [facility] [place], [place] [address]  Chief Complaint  Patient presents with  . New Patient (Initial Visit)    urinary incontinence referred by Dr. Dan Humphreys    HPI: Patient is a 75 -year-old Caucasian female who is referred to [place] by, Dr. Dan Humphreys, for urinary incontinence.  Patient states that she has had urinary incontinence for one year.  Patient has incontinence with sneezing, coughing, etc.   She is experiencing 5 incontinent episodes during the day.  She is experiencing two incontinent episodes during the night.  Her incontinence volume is large.   She is wearing 4 to 5 pads/depends daily.    She is having associated urinary frequency, urgency and nocturia.  She has a history of urinary tract infections.  She has no symptoms, she states every time she goes to the doctor she has one.    She does not have a STI's or injury to the bladder.   She denies dysuria, gross hematuria, suprapubic pain, back pain, abdominal pain or flank pain.  She has not had any recent fevers, chills, nausea or vomiting.  She does not have a history of nephrolithiasis, GU surgery or GU trauma.    She did state that she has been told she has blood in her urine since she was a child.    She is not sexually active.  She is post menopausal.   She admits to constipation and/or diarrhea.  She was just seen by GI.    She is not having pain with bladder filling.    She is drinking a fair amount of water daily.   She is drinking 3 caffeinated beverages daily.   She is not drinking alcoholic beverages daily.    Her risk factors for incontinence are obesity,  vaginal delivery, a family history of incontinence, age, caffeine, diabetes, stroke, depression and vaginal atrophy.    She is taking antihistamines, decongestants, opioids, OAB medication, ACE inhibitors, alpha blockers,  diuretics and antidepressants  Her PVR today was 56 mL.      PMH: Past Medical History:  Diagnosis Date  . AICD (automatic cardioverter/defibrillator) present    PLACED BY  DR  Graciela Husbands 2008  . Angina    5-6 YRS AGO  . Anxiety   . Arthritis   . Atrial fibrillation (HCC)   . CHF (congestive heart failure) (HCC)   . Complication of anesthesia    states at times had a tough time waking up  . Diabetes mellitus   . Diverticulum of esophagus    large diverticulum in the middle third of the esophagus [facility] EGD '09)  . Dysrhythmia    A-FIB  . Fibromyalgia   . Headache(784.0)   . Heartburn   . History of stomach ulcers   . History of transient ischemic attack (TIA)    LAST ONE IN  2005  IN  [place], [place]  . HLD (hyperlipidemia)   . Hypertension   . Shortness of breath    EASING OFF NOW  . Sleep apnea    DOESN'T KNOW SETTINGS  . Stroke (cerebrum) (HCC)   . TIA (transient ischemic attack)     Surgical History: Past Surgical History:  Procedure Laterality Date  . ANTERIOR CERVICAL DECOMP/DISCECTOMY FUSION  04/09/2011   Procedure: ANTERIOR CERVICAL DECOMPRESSION/DISCECTOMY FUSION 1 LEVEL;  Surgeon: Temple Pacini, MD;  Location: [facility];  Service: Neurosurgery;  Laterality: N/A;  Cervical five-six Anterior Cervical Decompression and Fusion  . APPENDECTOMY    . BILATERAL OOPHORECTOMY     BENIGN  . CARDIAC CATHETERIZATION     X 2    LAST ONE  2009  . CESAREAN SECTION     X  4  . CHOLECYSTECTOMY    . INSERT / REPLACE / REMOVE PACEMAKER     ST  JUDE  DEVICE  . JOINT REPLACEMENT     BIL  KNEES  . TONSILLECTOMY    . TUBAL LIGATION      Home Medications:    Medication List       Accurate as of 11/13/15  9:17 AM. Always use your most recent med list.          alendronate 70 MG tablet Commonly known as:  FOSAMAX Take by mouth.   B-D ULTRA-FINE 33 LANCETS Misc Use 1 each once daily.   carvedilol 12.5 MG tablet Commonly known as:  COREG Take 12.5 mg by mouth 2  (two) times daily with a meal.   dicyclomine 10 MG capsule Commonly known as:  BENTYL Take by mouth.   furosemide 20 MG tablet Commonly known as:  LASIX Take by mouth.   gabapentin 100 MG capsule Commonly known as:  NEURONTIN TAKE 1 CAPSULE BY MOUTH EVERY NIGHT   hydrochlorothiazide 25 MG tablet Commonly known as:  HYDRODIURIL Take by mouth.   HYDROcodone-acetaminophen 5-325 MG tablet Commonly known as:  NORCO/VICODIN Limit 1 tablet by mouth per day or 2-3 times per day if tolerated   metFORMIN 500 MG tablet Commonly known as:  GLUCOPHAGE Take 500 mg by mouth 2 (two) times daily with a meal.   nitrofurantoin 100 MG capsule Commonly known as:  MACRODANTIN Take 100 mg by mouth 2 (two) times daily.   omeprazole 20 MG capsule Commonly known as:  PRILOSEC Take 20 mg by mouth daily.   ONE TOUCH ULTRA TEST test strip Generic drug:  glucose blood Use once daily. Use as instructed.   pioglitazone 15 MG tablet Commonly known as:  ACTOS Take 15 mg by mouth daily.   quinapril 40 MG tablet Commonly known as:  ACCUPRIL Take 40 mg by mouth daily.   RA VITAMIN B-12 TR 1000 MCG Tbcr Generic drug:  Cyanocobalamin Take by mouth.   rOPINIRole 1 MG tablet Commonly known as:  REQUIP Take by mouth.   rosuvastatin 10 MG tablet Commonly known as:  CRESTOR Take 10 mg by mouth daily.   sertraline 100 MG tablet Commonly known as:  ZOLOFT Take 100 mg by mouth daily.   STOOL SOFTENER PO Take 1 tablet by mouth daily.   topiramate 25 MG tablet Commonly known as:  TOPAMAX Take 75 mg by mouth daily. Fibromyalgia flare up   fesoterodine 8 MG Tb24 tablet Commonly known as:  TOVIAZ Take 8 mg by mouth daily.   fesoterodine 8 MG Tb24 tablet Commonly known as:  TOVIAZ Take 1 tablet (8 mg total) by mouth daily.   TOVIAZ 4 MG Tb24 tablet Generic drug:  fesoterodine Take 8 mg by mouth daily.   traMADol 50 MG tablet Commonly known as:  ULTRAM Take 50 mg by mouth every 4 (four)  hours as needed.   traMADol 50 MG tablet Commonly known as:  ULTRAM Take 50 mg by mouth every 8 (eight) hours as needed. For pain   triamcinolone cream 0.1 % Commonly known as:  KENALOG Apply topically.   VITAMIN D-1000 MAX  ST 1000 units tablet Generic drug:  Cholecalciferol Take by mouth.   warfarin 2 MG tablet Commonly known as:  COUMADIN Take 2 mg by mouth.   warfarin 2 MG tablet Commonly known as:  COUMADIN Take 2 mg by mouth daily.       Allergies:  Allergies  Allergen Reactions  . Ciprofloxacin Hcl Itching    Rash and itching all over stomach and arms  . Dopamine Nausea And Vomiting  . Sulfa Antibiotics Rash  . Sulfacetamide Sodium Rash  . Tegaderm Ag Mesh [Silver] Other (See Comments) and Rash    Blisters and takes skin off    Family History: Family History  Problem Relation Age of Onset  . Kidney disease Neg Hx   . Bladder Cancer Neg Hx     Social History:  reports that she quit smoking about 24 years ago. Her smoking use included Cigarettes. She has a 20.00 pack-year smoking history. She has never used smokeless tobacco. She reports that she does not drink alcohol or use drugs.  ROS: UROLOGY Frequent Urination?: Yes Hard to postpone urination?: Yes Burning/pain with urination?: No Get up at night to urinate?: Yes Leakage of urine?: Yes Urine stream starts and stops?: No Trouble starting stream?: No Do you have to strain to urinate?: No Blood in urine?: Yes Urinary tract infection?: Yes Sexually transmitted disease?: No Injury to kidneys or bladder?: No Painful intercourse?: No Weak stream?: No Currently pregnant?: No Vaginal bleeding?: No Last menstrual period?: n  Gastrointestinal Nausea?: No Vomiting?: No Indigestion/heartburn?: No Diarrhea?: Yes Constipation?: Yes  Constitutional Fever: No Night sweats?: No Weight loss?: No Fatigue?: Yes  Skin Skin rash/lesions?: No Itching?: No  Eyes Blurred vision?: No Double vision?:  No  Ears/Nose/Throat Sore throat?: No Sinus problems?: Yes  Hematologic/Lymphatic Swollen glands?: No Easy bruising?: No  Cardiovascular Leg swelling?: No Chest pain?: No  Respiratory Cough?: Yes Shortness of breath?: Yes  Endocrine Excessive thirst?: No  Musculoskeletal Back pain?: Yes Joint pain?: Yes  Neurological Headaches?: Yes Dizziness?: Yes  Psychologic Depression?: Yes Anxiety?: No  Physical Exam: BP (!) 160/77   Pulse 71   Ht 5\' 2"  (1.575 m)   Wt 264 lb 3.2 oz (119.8 kg)   BMI 48.32 kg/m   Constitutional: Well nourished. Alert and oriented, No acute distress. HEENT: [place] AT, moist mucus membranes. Trachea midline, no masses. Cardiovascular: No clubbing, cyanosis, or edema. Respiratory: Normal respiratory effort, no increased work of breathing. GI: Abdomen is soft, non tender, non distended, no abdominal masses. Liver and spleen not palpable.  No hernias appreciated.  Stool sample for occult testing is not indicated.   GU: No CVA tenderness.  No bladder fullness or masses.  Atrophic external genitalia, normal pubic hair distribution, no lesions.  Normal urethral meatus, no lesions, no prolapse, no discharge.   No urethral masses, tenderness and/or tenderness. No bladder fullness, tenderness or masses. Pale vagina mucosa, poor estrogen effect, no discharge, no lesions, good pelvic support, no cystocele or rectocele noted.  Could not compete pelvic exam due to body habitus.  Anus and perineum are without rashes or lesions.    Skin: No rashes, bruises or suspicious lesions. Lymph: No cervical or inguinal adenopathy. Neurologic: Grossly intact, no focal deficits, moving all 4 extremities. Psychiatric: Normal mood and affect.  Laboratory Data: Lab Results  Component Value Date   WBC 11.0 (H) 01/20/2013   HGB 12.0 01/20/2013   HCT 36.4 01/20/2013   MCV 93.3 01/20/2013   PLT 205 01/20/2013  Lab Results  Component Value Date   CREATININE 0.74  01/20/2013    Pertinent Imaging: Results for LIZ, PINHO (MRN [id number]) as of 11/13/2015 09:13  Ref. Range 11/13/2015 08:50  Scan Result Unknown 56    Assessment & Plan:    1. Mixed incontinence  - patient would like to restart the Toviaz 8 mg, as she feels it has been beneficial in the past  - already in PT for pelvic floor dysfunction  - BLADDER SCAN AMB NON-IMAGING  2. Vaginal atrophy  - I explained to the patient that when women go through menopause and her estrogen levels are severely diminished, the normal vaginal flora will change.  This is due to an increase of the vaginal canal's pH. Because of this, the vaginal canal may be colonized by bacteria from the rectum instead of the protective lactobacillus.  This accompanied by the loss of the mucus barrier with vaginal atrophy is a cause of recurrent urinary tract infections, incontinence and odor.    - In some studies, the use of vaginal estrogen cream has been demonstrated to reduce  recurrent urinary tract infections to one a year.   - Patient was given a sample of vaginal estrogen cream (Premarin) and instructed to apply 0.5mg  (pea-sized amount)  just inside the vaginal introitus with a finger-tip every night for two weeks and then Monday, Wednesday and Friday nights.  I explained to the patient that vaginally administered estrogen, which causes only a slight increase in the blood estrogen levels, have fewer contraindications and adverse systemic effects that oral HT.  - I have also given prescriptions for the Estrace cream and Premarin cream, so that the patient may carry them to the pharmacy to see which one of the branded creams would be most economical for her.  If she finds both medications cost prohibitive, she is instructed to call the office.  We can then call in a compounded vaginal estrogen cream for the patient that may be more affordable.    - She will follow up in 6 weeks for an exam.    3. Nocturia  - I explained  to the patient that nocturia is often multi-factorial and difficult to treat.  Sleeping disorders, heart conditions and peripheral vascular disease, diabetes,  enlarged prostate or urethral stricture causing bladder outlet obstruction and/or certain medications.  - I have suggested that the patient avoid caffeine and alcohol in the evening. He may also benefit from fluid restrictions after 6:00 in the evening and voiding just prior to bedtime.  - I explained that research studies have showed that over 84% of patients with sleep apnea reported frequent nighttime urination.   With sleep apnea, oxygen decreases, carbon dioxide increases, the blood become more acidic, the heart rate drops and blood vessels in the lung constrict.  The body is then alerted that something is very wrong. The sleeper must wake enough to reopen the airway. By this time, the heart is racing and experiences a false signal of fluid overload. The heart excretes a hormone-like protein that tells the body to get rid of sodium and water, resulting in nocturia.  - The patient may also benefit from a discussion with her primary care physician to see if she can have a repeated sleep study.  4. Asymptomatic bacteriuria  - I explained to the patient that she should not have her urine checked for infection or be treated for an infection unless he is symptomatic  - I reviewed the symptoms of a  urinary tract infection, such as a worsening of urinary urgency and frequency, dysuria, which is painful urination and not the pain of urine hitting sensitive perineal skin, hematuria, foul-smelling urine, suprapubic pain or mental status changes. Fevers, chills, nausea and or vomiting can also be signs of a possible UTI.  Positive urinalyses and positive urine cultures that are not associated with urinary symptoms should not be treated with antibiotics.    5. Microscopic hematuria  - Reviewing her past UA's- she has had incidences of AMH  - She has not  had a former hematuria workup  - Will discuss this when she returns in 6 weeks.                               Return in about 6 weeks (around 12/25/2015), or PVR, exam and OAB .  These notes generated with voice recognition software. I apologize for typographical errors.  Michiel CowboySHANNON Ayani Ospina, PA-C  [facility] [address], [address] [place], [place] [address] [phone]

## 2015-11-19 ENCOUNTER — Telehealth: Payer: Self-pay

## 2015-11-19 NOTE — Telephone Encounter (Signed)
Pt insurance denied coverage for toviaz. Pt would like to have another medication. Please advise.

## 2015-11-20 DIAGNOSIS — I10 Essential (primary) hypertension: Secondary | ICD-10-CM | POA: Diagnosis not present

## 2015-11-20 DIAGNOSIS — I2510 Atherosclerotic heart disease of native coronary artery without angina pectoris: Secondary | ICD-10-CM | POA: Diagnosis not present

## 2015-11-20 DIAGNOSIS — I071 Rheumatic tricuspid insufficiency: Secondary | ICD-10-CM | POA: Diagnosis not present

## 2015-11-20 DIAGNOSIS — I340 Nonrheumatic mitral (valve) insufficiency: Secondary | ICD-10-CM | POA: Diagnosis not present

## 2015-11-20 DIAGNOSIS — G4733 Obstructive sleep apnea (adult) (pediatric): Secondary | ICD-10-CM | POA: Diagnosis not present

## 2015-11-20 DIAGNOSIS — I482 Chronic atrial fibrillation: Secondary | ICD-10-CM | POA: Diagnosis not present

## 2015-11-20 DIAGNOSIS — E782 Mixed hyperlipidemia: Secondary | ICD-10-CM | POA: Diagnosis not present

## 2015-11-20 DIAGNOSIS — E119 Type 2 diabetes mellitus without complications: Secondary | ICD-10-CM | POA: Diagnosis not present

## 2015-11-20 NOTE — Telephone Encounter (Signed)
If she would contact her insurance to obtain her formulary for medication coverage that would be helpful as there are about 10 medications for bladder issues.

## 2015-11-20 NOTE — Telephone Encounter (Signed)
LMOM

## 2015-11-21 NOTE — Telephone Encounter (Signed)
LMOM

## 2015-11-23 NOTE — Telephone Encounter (Signed)
LMOM- will send a letter.  

## 2015-11-26 ENCOUNTER — Telehealth: Payer: Self-pay

## 2015-11-26 DIAGNOSIS — N3281 Overactive bladder: Secondary | ICD-10-CM

## 2015-11-26 DIAGNOSIS — Z7901 Long term (current) use of anticoagulants: Secondary | ICD-10-CM | POA: Diagnosis not present

## 2015-11-26 NOTE — Telephone Encounter (Signed)
Per pt insurance co will pay for oxybutynin, tolteradine, and vesicare. Please advise.

## 2015-11-26 NOTE — Telephone Encounter (Signed)
We can go ahead and call in Vesicare 10 mg daily for her and she can keep her follow up on 11/21.

## 2015-11-26 NOTE — Telephone Encounter (Signed)
Pt called stating she is under the impression insurance will not cover her toviaz. Made pt aware attempted to reach her several times last week and a letter was sent. Made pt aware Carollee HerterShannon advised pt to call insurance company and find out what meds they will cover for OAB and Carollee HerterShannon will give her one of those. Pt voiced understanding.

## 2015-11-27 MED ORDER — SOLIFENACIN SUCCINATE 10 MG PO TABS: 10.0000 mg | ORAL_TABLET | Freq: Every day | ORAL | 0 refills | Status: DC

## 2015-11-27 NOTE — Telephone Encounter (Signed)
Spoke with pt in reference to OAB medications. Made aware vesicare was sent to pharmacy. Pt voiced understanding.  

## 2015-12-07 DIAGNOSIS — Z7901 Long term (current) use of anticoagulants: Secondary | ICD-10-CM | POA: Diagnosis not present

## 2015-12-12 DIAGNOSIS — Z7901 Long term (current) use of anticoagulants: Secondary | ICD-10-CM | POA: Diagnosis not present

## 2015-12-12 DIAGNOSIS — E119 Type 2 diabetes mellitus without complications: Secondary | ICD-10-CM | POA: Diagnosis not present

## 2015-12-19 DIAGNOSIS — E119 Type 2 diabetes mellitus without complications: Secondary | ICD-10-CM | POA: Diagnosis not present

## 2015-12-19 DIAGNOSIS — Z0000 Encounter for general adult medical examination without abnormal findings: Secondary | ICD-10-CM | POA: Diagnosis not present

## 2015-12-19 DIAGNOSIS — I10 Essential (primary) hypertension: Secondary | ICD-10-CM | POA: Diagnosis not present

## 2015-12-19 DIAGNOSIS — G8929 Other chronic pain: Secondary | ICD-10-CM | POA: Diagnosis not present

## 2015-12-19 DIAGNOSIS — I482 Chronic atrial fibrillation: Secondary | ICD-10-CM | POA: Diagnosis not present

## 2015-12-19 DIAGNOSIS — M545 Low back pain: Secondary | ICD-10-CM | POA: Diagnosis not present

## 2015-12-25 ENCOUNTER — Encounter: Payer: Self-pay | Admitting: Urology

## 2015-12-25 ENCOUNTER — Ambulatory Visit: Payer: PPO | Admitting: Urology

## 2015-12-25 VITALS — BP 153/74 | HR 94 | Ht 63.0 in | Wt 264.9 lb

## 2015-12-25 DIAGNOSIS — N3281 Overactive bladder: Secondary | ICD-10-CM | POA: Diagnosis not present

## 2015-12-25 DIAGNOSIS — N3946 Mixed incontinence: Secondary | ICD-10-CM

## 2015-12-25 DIAGNOSIS — N952 Postmenopausal atrophic vaginitis: Secondary | ICD-10-CM | POA: Diagnosis not present

## 2015-12-25 DIAGNOSIS — R3129 Other microscopic hematuria: Secondary | ICD-10-CM

## 2015-12-25 DIAGNOSIS — R351 Nocturia: Secondary | ICD-10-CM

## 2015-12-25 LAB — MICROSCOPIC EXAMINATION: Epithelial Cells (non renal): >10 /hpf — AB (ref 0–10)

## 2015-12-25 LAB — URINALYSIS, COMPLETE
Bilirubin, UA: NEGATIVE
GLUCOSE, UA: NEGATIVE
Ketones, UA: NEGATIVE
Nitrite, UA: POSITIVE — AB
Specific Gravity, UA: 1.025 (ref 1.005–1.030)
UUROB: 1 mg/dL (ref 0.2–1.0)
pH, UA: 6.5 (ref 5.0–7.5)

## 2015-12-25 LAB — BLADDER SCAN AMB NON-IMAGING: SCAN RESULT: 12

## 2015-12-25 MED ORDER — SOLIFENACIN SUCCINATE 10 MG PO TABS: 10.0000 mg | ORAL_TABLET | Freq: Every day | ORAL | 4 refills | Status: DC

## 2015-12-25 NOTE — Progress Notes (Signed)
12/25/2015 2:30 PM   Heather LoftySandra J Burton 12/18/1940 [id number]  Referring provider: [name], MD [phone] [facility] [place], [place] [address]  Chief Complaint  Patient presents with  . Urinary Incontinence    Mixed stress and urge 6 week follow up    HPI: Patient is a 75 year old Caucasian female who presents today for a 6 week follow up mixed incontinence, vaginal atrophy, nocturia and microscopic hematuria.  Background history Patient was referred to us by, Dr. Dan HumphreysWalker, for urinary incontinence.  Patient states that she has had urinary incontinence for one year.  Patient has incontinence with sneezing, coughing, etc.   She is experiencing 5 incontinent episodes during the day.  She is experiencing two incontinent episodes during the night.  Her incontinence volume is large.   She is wearing 4 to 5 pads/depends daily.  She is having associated urinary frequency, urgency and nocturia.  She has a history of urinary tract infections.  She has no symptoms, she states every time she goes to the doctor she has one.  She does not have a STI's or injury to the bladder.  She denies dysuria, gross hematuria, suprapubic pain, back pain, abdominal pain or flank pain.  She has not had any recent fevers, chills, nausea or vomiting.  She does not have a history of nephrolithiasis, GU surgery or GU trauma.  She did state that she has been told she has blood in her urine since she was a child.  She is not sexually active.  She is post menopausal.  She admits to constipation and/or diarrhea.  She was just seen by GI.  She is not having pain with bladder filling.  She is drinking a fair amount of water daily.   She is drinking 3 caffeinated beverages daily.   She is not drinking alcoholic beverages daily.   Her risk factors for incontinence are obesity,  vaginal delivery, a family history of incontinence, age, caffeine, diabetes, stroke, depression and vaginal atrophy.   She is taking  antihistamines, decongestants, opioids, OAB medication, ACE inhibitors, alpha blockers, diuretics and antidepressants.  Her PVR was 56 mL.    Mixed incontinence Patient states has noticed a decrease from 5 pads daily to 1 to 2 pads daily.  She is not aware of the frequency amount as "she is not counting the trips, she's counting the drips."   She is still having frequency and nocturia x 3.  She is bothered a great deal by a sudden urge to urinate, accidental loss of small amounts of urine, night time urination, waking up at night because she has to urinate and urine loss associated with a strong desire to urinate.  Her PVR is 12 mL.    Vaginal atrophy Patient is not using her vaginal estrogen cream.    Nocturia Patient is waking up 3 times nightly to urinate.  She does have a CPAP machine, but she states that it is old and cumbersome for her to use.  She states that her insurance will not cover the cost of a new machine.  Microscopic hematuria Patient had 11-30 RBCs on today's urinalysis. Repeated catheterized specimen demonstrated the same findings.  Patient has not had any gross hematuria. She states that she has been told that she always has blood in her urine.  Contrast CT in 2014 noted bilateral renal cysts.     PMH: Past Medical History:  Diagnosis Date  . AICD (automatic cardioverter/defibrillator) present  PLACED BY  DR  [name] 2008  . Angina    5-6 YRS AGO  . Anxiety   . Arthritis   . Atrial fibrillation (HCC)   . CHF (congestive heart failure) (HCC)   . Complication of anesthesia    states at times had a tough time waking up  . Diabetes mellitus   . Diverticulum of esophagus    large diverticulum in the middle third of the esophagus [facility] EGD '09)  . Dysrhythmia    A-FIB  . Fibromyalgia   . Headache(784.0)   . Heartburn   . History of stomach ulcers   . History of transient ischemic attack (TIA)    LAST ONE IN  2005  IN  [place], [place]  . HLD (hyperlipidemia)   .  Hypertension   . Shortness of breath    EASING OFF NOW  . Sleep apnea    DOESN'T KNOW SETTINGS  . Stroke (cerebrum) (HCC)   . TIA (transient ischemic attack)     Surgical History: Past Surgical History:  Procedure Laterality Date  . ANTERIOR CERVICAL DECOMP/DISCECTOMY FUSION  04/09/2011   Procedure: ANTERIOR CERVICAL DECOMPRESSION/DISCECTOMY FUSION 1 LEVEL;  Surgeon: Heather Pacini, MD;  Location: [facility];  Service: Neurosurgery;  Laterality: N/A;  Cervical five-six Anterior Cervical Decompression and Fusion  . APPENDECTOMY    . BILATERAL OOPHORECTOMY     BENIGN  . CARDIAC CATHETERIZATION     X 2    LAST ONE  2009  . CESAREAN SECTION     X  4  . CHOLECYSTECTOMY    . INSERT / REPLACE / REMOVE PACEMAKER     ST  JUDE  DEVICE  . JOINT REPLACEMENT     BIL  KNEES  . TONSILLECTOMY    . TUBAL LIGATION      Home Medications:    Medication List       Accurate as of 12/25/15  2:30 PM. Always use your most recent med list.          alendronate 70 MG tablet Commonly known as:  FOSAMAX Take by mouth.   B-D ULTRA-FINE 33 LANCETS Misc Use 1 each once daily.   carvedilol 12.5 MG tablet Commonly known as:  COREG Take 12.5 mg by mouth 2 (two) times daily with a meal.   conjugated estrogens vaginal cream Commonly known as:  PREMARIN Place 1 Applicatorful vaginally daily. Apply 0.5mg  (pea-sized amount)  just inside the vaginal introitus with a finger-tip every night for two weeks and then Monday, Wednesday and Friday nights.   dicyclomine 10 MG capsule Commonly known as:  BENTYL Take by mouth.   estradiol 0.1 MG/GM vaginal cream Commonly known as:  ESTRACE VAGINAL Apply 0.5mg  (pea-sized amount)  just inside the vaginal introitus with a finger-tip every night for two weeks and then Monday, Wednesday and Friday nights.   furosemide 20 MG tablet Commonly known as:  LASIX Take by mouth.   gabapentin 100 MG capsule Commonly known as:  NEURONTIN TAKE 1 CAPSULE BY MOUTH  EVERY NIGHT   hydrochlorothiazide 25 MG tablet Commonly known as:  HYDRODIURIL Take by mouth.   HYDROcodone-acetaminophen 5-325 MG tablet Commonly known as:  NORCO/VICODIN Limit 1 tablet by mouth per day or 2-3 times per day if tolerated   metFORMIN 500 MG tablet Commonly known as:  GLUCOPHAGE Take 500 mg by mouth 2 (two) times daily with a meal.   nitrofurantoin 100 MG capsule Commonly known as:  MACRODANTIN Take 100 mg by  mouth 2 (two) times daily.   omeprazole 20 MG capsule Commonly known as:  PRILOSEC Take 20 mg by mouth daily.   ONE TOUCH ULTRA TEST test strip Generic drug:  glucose blood Use once daily. Use as instructed.   pioglitazone 15 MG tablet Commonly known as:  ACTOS Take 15 mg by mouth daily.   quinapril 40 MG tablet Commonly known as:  ACCUPRIL Take 40 mg by mouth daily.   RA VITAMIN B-12 TR 1000 MCG Tbcr Generic drug:  Cyanocobalamin Take by mouth.   rOPINIRole 1 MG tablet Commonly known as:  REQUIP Take by mouth.   rosuvastatin 10 MG tablet Commonly known as:  CRESTOR Take 10 mg by mouth daily.   sertraline 100 MG tablet Commonly known as:  ZOLOFT Take 100 mg by mouth daily.   solifenacin 10 MG tablet Commonly known as:  VESICARE Take 1 tablet (10 mg total) by mouth daily.   STOOL SOFTENER PO Take 1 tablet by mouth daily.   topiramate 25 MG tablet Commonly known as:  TOPAMAX Take 75 mg by mouth daily. Fibromyalgia flare up   fesoterodine 8 MG Tb24 tablet Commonly known as:  TOVIAZ Take 8 mg by mouth daily.   fesoterodine 8 MG Tb24 tablet Commonly known as:  TOVIAZ Take 1 tablet (8 mg total) by mouth daily.   TOVIAZ 4 MG Tb24 tablet Generic drug:  fesoterodine Take 8 mg by mouth daily.   traMADol 50 MG tablet Commonly known as:  ULTRAM Take 50 mg by mouth every 4 (four) hours as needed.   traMADol 50 MG tablet Commonly known as:  ULTRAM Take 50 mg by mouth every 8 (eight) hours as needed. For pain   triamcinolone cream  0.1 % Commonly known as:  KENALOG Apply topically.   VITAMIN D-1000 MAX ST 1000 units tablet Generic drug:  Cholecalciferol Take by mouth.   warfarin 2 MG tablet Commonly known as:  COUMADIN Take 2 mg by mouth.   warfarin 2 MG tablet Commonly known as:  COUMADIN Take 2 mg by mouth daily.       Allergies:  Allergies  Allergen Reactions  . Ciprofloxacin Hcl Itching    Rash and itching all over stomach and arms  . Dopamine Nausea And Vomiting  . Sulfa Antibiotics Rash  . Sulfacetamide Sodium Rash  . Tegaderm Ag Mesh [Silver] Other (See Comments) and Rash    Blisters and takes skin off    Family History: Family History  Problem Relation Age of Onset  . Kidney disease Neg Hx   . Bladder Cancer Neg Hx     Social History:  reports that she quit smoking about 24 years ago. Her smoking use included Cigarettes. She has a 20.00 pack-year smoking history. She has never used smokeless tobacco. She reports that she does not drink alcohol or use drugs.  ROS: UROLOGY Frequent Urination?: Yes Hard to postpone urination?: No Burning/pain with urination?: No Get up at night to urinate?: Yes Leakage of urine?: Yes Urine stream starts and stops?: No Trouble starting stream?: No Do you have to strain to urinate?: No Blood in urine?: No Urinary tract infection?: Yes Sexually transmitted disease?: No Injury to kidneys or bladder?: No Painful intercourse?: No Weak stream?: No Currently pregnant?: No Vaginal bleeding?: No Last menstrual period?: n  Gastrointestinal Nausea?: No Vomiting?: No Indigestion/heartburn?: No Diarrhea?: No Constipation?: Yes  Constitutional Fever: No Night sweats?: No Weight loss?: No Fatigue?: Yes  Skin Skin rash/lesions?: No Itching?: No  Eyes  Blurred vision?: No Double vision?: No  Ears/Nose/Throat Sore throat?: No Sinus problems?: No  Hematologic/Lymphatic Swollen glands?: No Easy bruising?: Yes  Cardiovascular Leg  swelling?: No Chest pain?: No  Respiratory Cough?: Yes Shortness of breath?: Yes  Endocrine Excessive thirst?: No  Musculoskeletal Back pain?: Yes Joint pain?: Yes  Neurological Headaches?: No Dizziness?: No  Psychologic Depression?: Yes Anxiety?: No  Physical Exam: BP (!) 153/74   Pulse 94   Ht 5\' 3"  (1.6 m)   Wt 264 lb 14.4 oz (120.2 kg)   BMI 46.92 kg/m   Constitutional: Well nourished. Alert and oriented, No acute distress. HEENT: [place] AT, moist mucus membranes. Trachea midline, no masses. Cardiovascular: No clubbing, cyanosis, or edema. Respiratory: Normal respiratory effort, no increased work of breathing. GI: Abdomen is soft, non tender, non distended, no abdominal masses. Liver and spleen not palpable.  No hernias appreciated.  Stool sample for occult testing is not indicated.   GU: No CVA tenderness.  No bladder fullness or masses.  Atrophic external genitalia, normal pubic hair distribution, no lesions.  Normal urethral meatus, no lesions, no prolapse, no discharge.   No urethral masses, tenderness and/or tenderness. No bladder fullness, tenderness or masses. Pale vagina mucosa, poor estrogen effect, no discharge, no lesions, good pelvic support, no cystocele or rectocele noted.  Could not compete pelvic exam due to body habitus.  Anus and perineum are without rashes or lesions.    Skin: No rashes, bruises or suspicious lesions. Lymph: No cervical or inguinal adenopathy. Neurologic: Grossly intact, no focal deficits, moving all 4 extremities. Psychiatric: Normal mood and affect.  Laboratory Data: Lab Results  Component Value Date   WBC 11.0 (H) 01/20/2013   HGB 12.0 01/20/2013   HCT 36.4 01/20/2013   MCV 93.3 01/20/2013   PLT 205 01/20/2013    Lab Results  Component Value Date   CREATININE 0.74 01/20/2013   Urinalysis 11-30 WBC's, 11-30 RBC's and moderate bacteria.  See EPIC.    Pertinent Imaging: Results for Heather LoftyERRY, Lidya J (MRN [id number]) as of  12/25/2015 14:06  Ref. Range 12/25/2015 13:40  Scan Result Unknown 12     Assessment & Plan:    1. Mixed incontinence  - She has found the Vesicare 10 mg daily very effective  - already in PT for pelvic floor dysfunction  - BLADDER SCAN AMB NON-IMAGING  2. Vaginal atrophy  - I explained to the patient that when women go through menopause and her estrogen levels are severely diminished, the normal vaginal flora will change.  This is due to an increase of the vaginal canal's pH. Because of this, the vaginal canal may be colonized by bacteria from the rectum instead of the protective lactobacillus.  This accompanied by the loss of the mucus barrier with vaginal atrophy is a cause of recurrent urinary tract infections, incontinence and odor.    - In some studies, the use of vaginal estrogen cream has been demonstrated to reduce  recurrent urinary tract infections to one a year.   - Patient was given a sample of vaginal estrogen cream (Premarin) and instructed to apply 0.5mg  (pea-sized amount)  just inside the vaginal introitus with a finger-tip every night for two weeks and then Monday, Wednesday and Friday nights.  I explained to the patient that vaginally administered estrogen, which causes only a slight increase in the blood estrogen levels, have fewer contraindications and adverse systemic effects that oral HT.  - I have also given prescriptions for the Estrace cream and Premarin  cream, so that the patient may carry them to the pharmacy to see which one of the branded creams would be most economical for her.  If she finds both medications cost prohibitive, she is instructed to call the office.  We can then call in a compounded vaginal estrogen cream for the patient that may be more affordable.    - She will follow up in 6 weeks for an exam.    3. Nocturia  - Patient stated that she did not qualify for a new CPAP machine according to her newer sleep study and she cannot sleep with the old CPAP  machine  4. Microscopic hematuria  - Reviewing her past UA's- she has had incidences of AMH  - She has not had a formal hematuria workup  - She still has AMH in her CATH UA today  - will send for culture, if negative will encourage hematuria work up                               Return for pending urine culture results.  These notes generated with voice recognition software. I apologize for typographical errors.  Michiel Cowboy, PA-C  [facility] [address], [address] [place], [place] [address] [phone]

## 2015-12-29 ENCOUNTER — Other Ambulatory Visit: Payer: Self-pay | Admitting: Urology

## 2015-12-29 ENCOUNTER — Telehealth: Payer: Self-pay | Admitting: Urology

## 2015-12-29 LAB — CULTURE, URINE COMPREHENSIVE

## 2015-12-29 MED ORDER — AMOXICILLIN-POT CLAVULANATE 875-125 MG PO TABS: 1.0000 | ORAL_TABLET | Freq: Two times a day (BID) | ORAL | 0 refills | Status: DC

## 2015-12-29 NOTE — Telephone Encounter (Signed)
I would like a CATH UA once she has completed her antibiotic to ensure the hematuria clears.

## 2015-12-31 ENCOUNTER — Telehealth: Payer: Self-pay

## 2015-12-31 NOTE — Telephone Encounter (Signed)
LMOM

## 2015-12-31 NOTE — Telephone Encounter (Signed)
-----   Message from Harle BattiestShannon A McGowan, PA-C sent at 12/29/2015  2:27 PM EST ----- Patient has a +UCx.  They need to start Augmentin 875/125, one tablet twice daily for seven days.  They also need to take a probiotic with the antibiotic course.  The dosage is listed below:  L. acidophilus and L. casei (25 x 109 CFU/day for 2 days, then 50 x 109 CFU/day for duration of the antibiotic course)  [facility]

## 2016-01-01 NOTE — Telephone Encounter (Signed)
Spoke with pt in reference to abx. Pt stated she picked up the abx over the weekend.

## 2016-01-01 NOTE — Telephone Encounter (Signed)
Spoke with pt in reference to needing a cath specimen post abx therapy. Pt stated that she has had blood in her urine since she was a teenager and does not want to treat at this time. Please advise.

## 2016-02-11 ENCOUNTER — Ambulatory Visit: Payer: PPO | Attending: Student | Admitting: Physical Therapy

## 2016-02-11 ENCOUNTER — Encounter: Payer: Self-pay | Admitting: Physical Therapy

## 2016-02-11 DIAGNOSIS — M6281 Muscle weakness (generalized): Secondary | ICD-10-CM | POA: Insufficient documentation

## 2016-02-11 NOTE — Therapy (Signed)
[place] [facility] [address] [place], [place], [address] Phone: [phone]   Fax:  [phone]  Patient Details  Name: Heather LoftySandra J Zayed MRN: [id number] Date of Birth: 09/24/1940 Referring Provider:  Harmon DunMichelle Johnson, PA-C   Encounter Date: 02/11/2016    Pt reports she is unsure why she is here for Pelvic Health PT. Pt states she has taking Vesicare and being careful with what she eats which have improved her bowel conditions. Pt is declining PT at this time.    Mariane MastersYeung,Shin Yiing ,PT, DPT, E-RYT  02/11/2016, 1:19 PM  [place] [facility] [address] , [place], [address] Phone: [phone]   Fax:  [phone]

## 2016-02-12 NOTE — Therapy (Signed)
[place] [facility] [address] [place], [place], [address] Phone: [phone]   Fax:  [phone]  Patient Details  Name: [name] MRN: [id number] Date of Birth: 03/04/1940 Referring Provider:  Burman FreestoneJohnson, Michelle C, GeorgiaPA*  Encounter Date: 02/11/2016  Pt reports she is unsure why she is here for Pelvic Health PT. Pt states she has taking Vesicare and being careful with what she eats which have improved her bowel conditions. Pt states she has a little bit of a remaining issue with controlling her bowels. Pt is declining PT at this time despite being explained about the benefits of PT for her remaining issues.    Mariane MastersYeung,Shin Yiing ,PT, DPT, E-RYT  02/11/2016, 1:19 PM  [place] [facility] [address] [place], [place], [address] Phone: [phone]   Fax:  [phone] Mariane MastersYeung,Shin Yiing 02/12/2016, 6:02 PM  [facility] [address] [place], [place], [address] Phone: [phone]   Fax:  [phone]

## 2016-02-18 ENCOUNTER — Ambulatory Visit: Payer: PPO | Admitting: Physical Therapy

## 2016-02-25 ENCOUNTER — Encounter: Payer: Medicare Other | Admitting: Physical Therapy

## 2016-03-03 ENCOUNTER — Encounter: Payer: Medicare Other | Admitting: Physical Therapy

## 2016-03-14 DIAGNOSIS — E119 Type 2 diabetes mellitus without complications: Secondary | ICD-10-CM | POA: Diagnosis not present

## 2016-03-17 ENCOUNTER — Encounter: Payer: Medicare Other | Admitting: Physical Therapy

## 2016-03-20 DIAGNOSIS — I2510 Atherosclerotic heart disease of native coronary artery without angina pectoris: Secondary | ICD-10-CM | POA: Diagnosis not present

## 2016-03-20 DIAGNOSIS — I10 Essential (primary) hypertension: Secondary | ICD-10-CM | POA: Diagnosis not present

## 2016-03-20 DIAGNOSIS — I482 Chronic atrial fibrillation: Secondary | ICD-10-CM | POA: Diagnosis not present

## 2016-03-20 DIAGNOSIS — J042 Acute laryngotracheitis: Secondary | ICD-10-CM | POA: Diagnosis not present

## 2016-03-20 DIAGNOSIS — E119 Type 2 diabetes mellitus without complications: Secondary | ICD-10-CM | POA: Diagnosis not present

## 2016-03-31 ENCOUNTER — Encounter: Payer: Medicare Other | Admitting: Physical Therapy

## 2016-04-14 ENCOUNTER — Encounter: Payer: Medicare Other | Admitting: Physical Therapy

## 2016-04-22 DIAGNOSIS — Z7901 Long term (current) use of anticoagulants: Secondary | ICD-10-CM | POA: Diagnosis not present

## 2016-05-21 DIAGNOSIS — Z7901 Long term (current) use of anticoagulants: Secondary | ICD-10-CM | POA: Diagnosis not present

## 2016-05-26 DIAGNOSIS — I2510 Atherosclerotic heart disease of native coronary artery without angina pectoris: Secondary | ICD-10-CM | POA: Diagnosis not present

## 2016-05-26 DIAGNOSIS — E119 Type 2 diabetes mellitus without complications: Secondary | ICD-10-CM | POA: Diagnosis not present

## 2016-05-26 DIAGNOSIS — I10 Essential (primary) hypertension: Secondary | ICD-10-CM | POA: Diagnosis not present

## 2016-05-26 DIAGNOSIS — I340 Nonrheumatic mitral (valve) insufficiency: Secondary | ICD-10-CM | POA: Diagnosis not present

## 2016-05-26 DIAGNOSIS — E782 Mixed hyperlipidemia: Secondary | ICD-10-CM | POA: Diagnosis not present

## 2016-05-26 DIAGNOSIS — E669 Obesity, unspecified: Secondary | ICD-10-CM | POA: Diagnosis not present

## 2016-05-26 DIAGNOSIS — I482 Chronic atrial fibrillation: Secondary | ICD-10-CM | POA: Diagnosis not present

## 2016-05-26 DIAGNOSIS — I071 Rheumatic tricuspid insufficiency: Secondary | ICD-10-CM | POA: Diagnosis not present

## 2016-05-26 DIAGNOSIS — G4733 Obstructive sleep apnea (adult) (pediatric): Secondary | ICD-10-CM | POA: Diagnosis not present

## 2016-06-10 DIAGNOSIS — Z7901 Long term (current) use of anticoagulants: Secondary | ICD-10-CM | POA: Diagnosis not present

## 2016-06-10 DIAGNOSIS — I10 Essential (primary) hypertension: Secondary | ICD-10-CM | POA: Diagnosis not present

## 2016-06-10 DIAGNOSIS — I482 Chronic atrial fibrillation: Secondary | ICD-10-CM | POA: Diagnosis not present

## 2016-06-18 DIAGNOSIS — Z6841 Body Mass Index (BMI) 40.0 and over, adult: Secondary | ICD-10-CM | POA: Diagnosis not present

## 2016-06-18 DIAGNOSIS — I482 Chronic atrial fibrillation: Secondary | ICD-10-CM | POA: Diagnosis not present

## 2016-06-18 DIAGNOSIS — E119 Type 2 diabetes mellitus without complications: Secondary | ICD-10-CM | POA: Diagnosis not present

## 2016-06-18 DIAGNOSIS — I10 Essential (primary) hypertension: Secondary | ICD-10-CM | POA: Diagnosis not present

## 2016-06-18 DIAGNOSIS — R791 Abnormal coagulation profile: Secondary | ICD-10-CM | POA: Diagnosis not present

## 2016-06-24 DIAGNOSIS — I480 Paroxysmal atrial fibrillation: Secondary | ICD-10-CM | POA: Diagnosis not present

## 2016-06-26 DIAGNOSIS — Z7901 Long term (current) use of anticoagulants: Secondary | ICD-10-CM | POA: Diagnosis not present

## 2016-07-04 DIAGNOSIS — R791 Abnormal coagulation profile: Secondary | ICD-10-CM | POA: Diagnosis not present

## 2016-07-07 DIAGNOSIS — R791 Abnormal coagulation profile: Secondary | ICD-10-CM | POA: Diagnosis not present

## 2016-07-24 DIAGNOSIS — Z7901 Long term (current) use of anticoagulants: Secondary | ICD-10-CM | POA: Diagnosis not present

## 2016-07-31 IMAGING — CR DG CHEST 2V
1 series · 2 of 2 positions shown · non-contrast
Comparison: Radiograph 12/07/2012

ADDENDUM:
Voice recognition error: The last sentence of the Findings section
should read "NO pneumothorax."
CLINICAL DATA: Congestive heart failure

EXAM:
CHEST  2 VIEW

[Series 1: dxr chest pa (or ap) and lateral · 0.14mm/px · 2 of 2 slices shown]
[im 1/2]
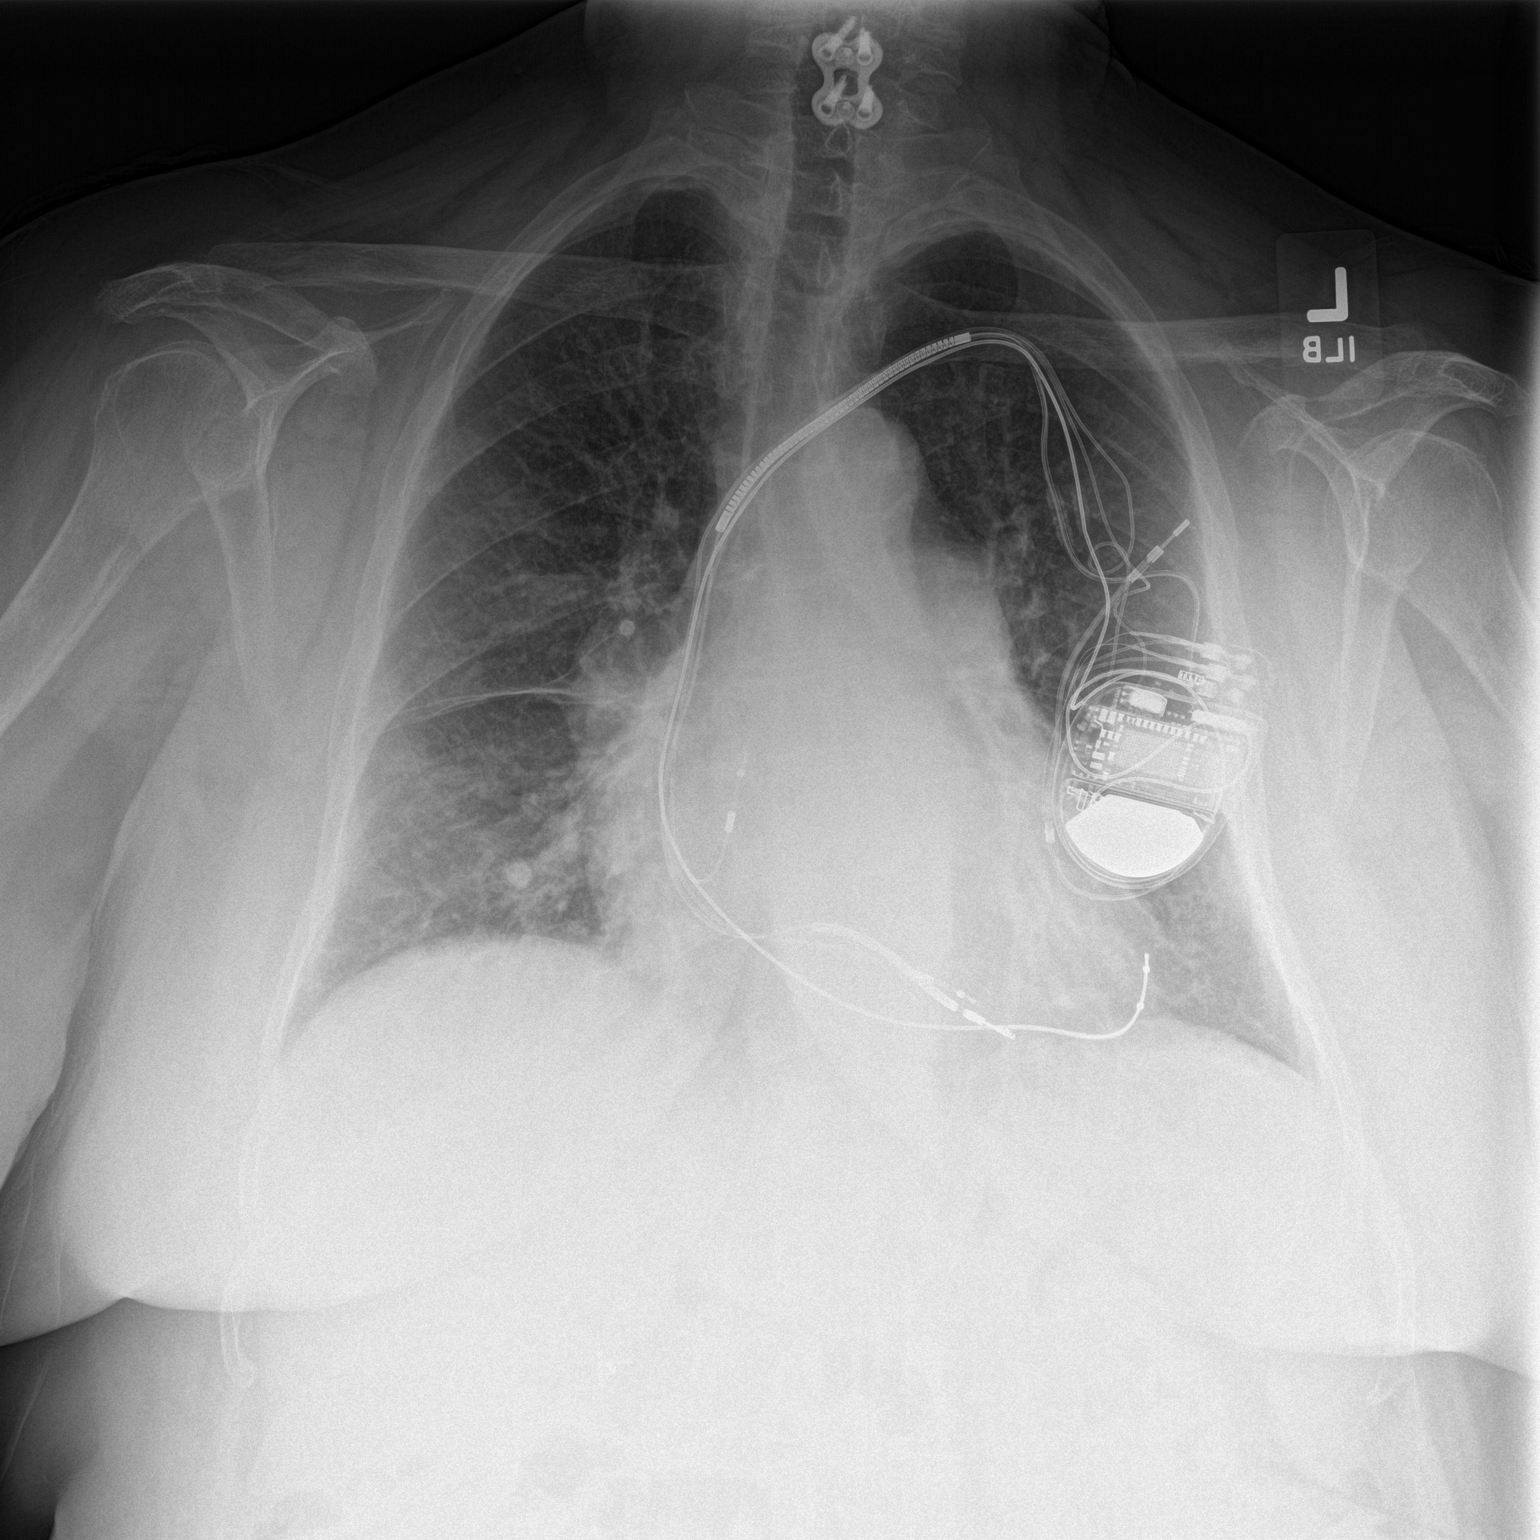
[im 2/2]
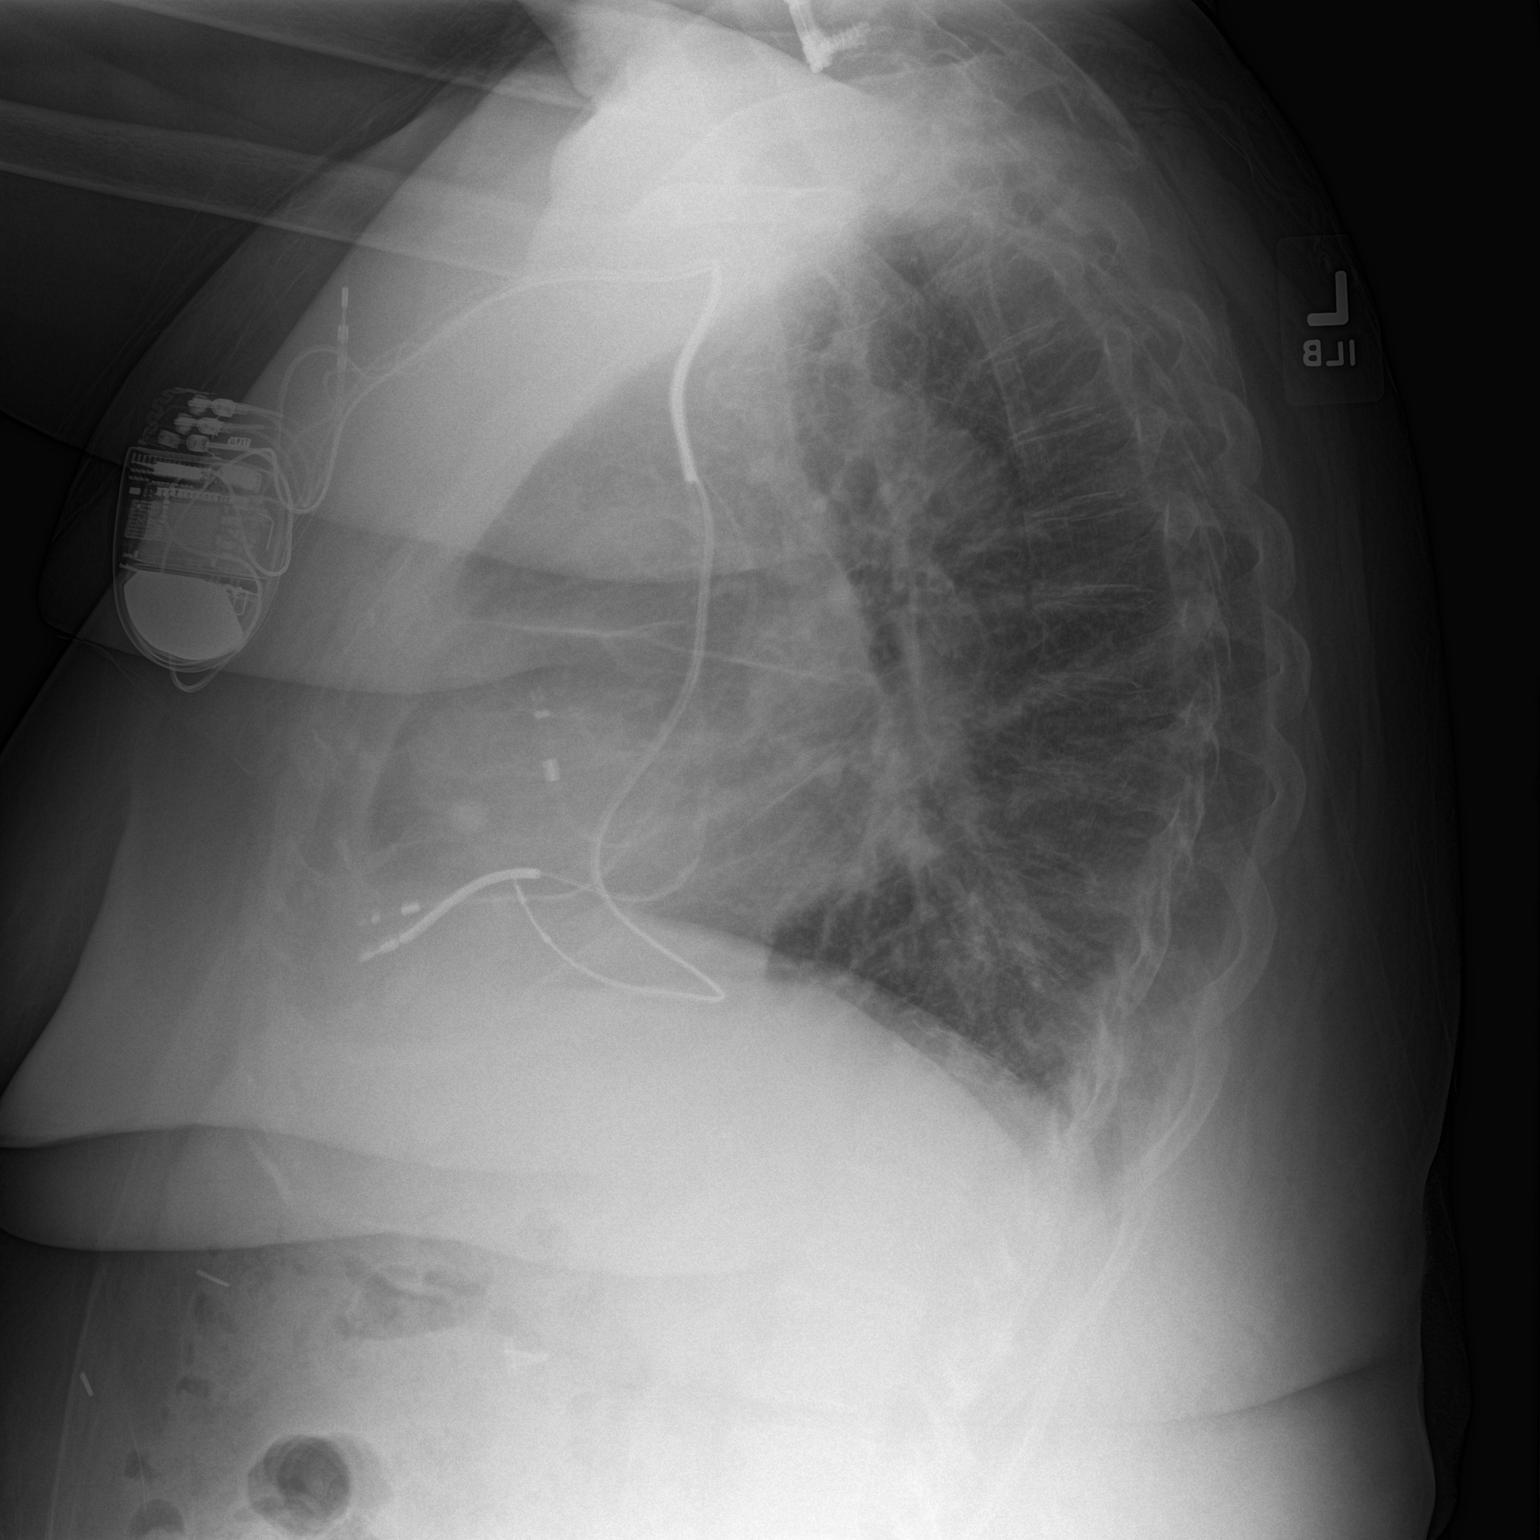

[2 of 2 positions shown; findings below may reference images not displayed]

FINDINGS: Left-sided pacemaker with four continuous leads overlies stable
cardiac silhouette. There is mild basilar atelectasis increased from
prior. No focal infiltrate. Trace pericardial effusions. Mild
central venous pulmonary congestion. Anterior cervical fusion.
Pneumothorax.
IMPRESSION: Mild increase in central venous congestion and trace effusions.

## 2016-08-25 DIAGNOSIS — Z7901 Long term (current) use of anticoagulants: Secondary | ICD-10-CM | POA: Diagnosis not present

## 2016-09-19 DIAGNOSIS — E119 Type 2 diabetes mellitus without complications: Secondary | ICD-10-CM | POA: Diagnosis not present

## 2016-09-19 DIAGNOSIS — Z7901 Long term (current) use of anticoagulants: Secondary | ICD-10-CM | POA: Diagnosis not present

## 2016-09-23 DIAGNOSIS — I2510 Atherosclerotic heart disease of native coronary artery without angina pectoris: Secondary | ICD-10-CM | POA: Diagnosis not present

## 2016-09-23 DIAGNOSIS — I10 Essential (primary) hypertension: Secondary | ICD-10-CM | POA: Diagnosis not present

## 2016-09-23 DIAGNOSIS — G4733 Obstructive sleep apnea (adult) (pediatric): Secondary | ICD-10-CM | POA: Diagnosis not present

## 2016-09-23 DIAGNOSIS — E119 Type 2 diabetes mellitus without complications: Secondary | ICD-10-CM | POA: Diagnosis not present

## 2016-10-14 DIAGNOSIS — I482 Chronic atrial fibrillation: Secondary | ICD-10-CM | POA: Diagnosis not present

## 2016-10-20 DIAGNOSIS — Z7901 Long term (current) use of anticoagulants: Secondary | ICD-10-CM | POA: Diagnosis not present

## 2016-12-02 DIAGNOSIS — G4733 Obstructive sleep apnea (adult) (pediatric): Secondary | ICD-10-CM | POA: Diagnosis not present

## 2016-12-02 DIAGNOSIS — I482 Chronic atrial fibrillation: Secondary | ICD-10-CM | POA: Diagnosis not present

## 2016-12-02 DIAGNOSIS — G8929 Other chronic pain: Secondary | ICD-10-CM | POA: Diagnosis not present

## 2016-12-02 DIAGNOSIS — M545 Low back pain: Secondary | ICD-10-CM | POA: Diagnosis not present

## 2016-12-02 DIAGNOSIS — Z95810 Presence of automatic (implantable) cardiac defibrillator: Secondary | ICD-10-CM | POA: Diagnosis not present

## 2016-12-02 DIAGNOSIS — Z7901 Long term (current) use of anticoagulants: Secondary | ICD-10-CM | POA: Diagnosis not present

## 2016-12-02 DIAGNOSIS — E1169 Type 2 diabetes mellitus with other specified complication: Secondary | ICD-10-CM | POA: Diagnosis not present

## 2016-12-02 DIAGNOSIS — I10 Essential (primary) hypertension: Secondary | ICD-10-CM | POA: Diagnosis not present

## 2016-12-02 DIAGNOSIS — E669 Obesity, unspecified: Secondary | ICD-10-CM | POA: Diagnosis not present

## 2016-12-02 DIAGNOSIS — M170 Bilateral primary osteoarthritis of knee: Secondary | ICD-10-CM | POA: Diagnosis not present

## 2016-12-03 ENCOUNTER — Other Ambulatory Visit: Payer: Self-pay | Admitting: General Surgery

## 2016-12-03 DIAGNOSIS — I482 Chronic atrial fibrillation: Secondary | ICD-10-CM | POA: Diagnosis not present

## 2016-12-03 DIAGNOSIS — E782 Mixed hyperlipidemia: Secondary | ICD-10-CM | POA: Diagnosis not present

## 2016-12-03 DIAGNOSIS — R0602 Shortness of breath: Secondary | ICD-10-CM | POA: Diagnosis not present

## 2016-12-03 DIAGNOSIS — I071 Rheumatic tricuspid insufficiency: Secondary | ICD-10-CM | POA: Diagnosis not present

## 2016-12-03 DIAGNOSIS — E119 Type 2 diabetes mellitus without complications: Secondary | ICD-10-CM | POA: Diagnosis not present

## 2016-12-03 DIAGNOSIS — I10 Essential (primary) hypertension: Secondary | ICD-10-CM | POA: Diagnosis not present

## 2016-12-03 DIAGNOSIS — Z01818 Encounter for other preprocedural examination: Secondary | ICD-10-CM | POA: Diagnosis not present

## 2016-12-03 DIAGNOSIS — R079 Chest pain, unspecified: Secondary | ICD-10-CM | POA: Diagnosis not present

## 2016-12-03 DIAGNOSIS — I2510 Atherosclerotic heart disease of native coronary artery without angina pectoris: Secondary | ICD-10-CM | POA: Diagnosis not present

## 2016-12-03 DIAGNOSIS — G4733 Obstructive sleep apnea (adult) (pediatric): Secondary | ICD-10-CM | POA: Diagnosis not present

## 2016-12-03 DIAGNOSIS — I340 Nonrheumatic mitral (valve) insufficiency: Secondary | ICD-10-CM | POA: Diagnosis not present

## 2016-12-15 DIAGNOSIS — E119 Type 2 diabetes mellitus without complications: Secondary | ICD-10-CM | POA: Diagnosis not present

## 2016-12-17 ENCOUNTER — Ambulatory Visit
Admission: RE | Admit: 2016-12-17 | Discharge: 2016-12-17 | Disposition: A | Discharge status: Home / Self Care | Payer: PPO | Source: Ambulatory Visit | Attending: General Surgery | Admitting: General Surgery

## 2016-12-17 DIAGNOSIS — Z01818 Encounter for other preprocedural examination: Secondary | ICD-10-CM | POA: Insufficient documentation

## 2016-12-17 DIAGNOSIS — Z01810 Encounter for preprocedural cardiovascular examination: Secondary | ICD-10-CM | POA: Diagnosis not present

## 2016-12-17 DIAGNOSIS — J40 Bronchitis, not specified as acute or chronic: Secondary | ICD-10-CM | POA: Diagnosis not present

## 2016-12-17 DIAGNOSIS — I700 Atherosclerosis of aorta: Secondary | ICD-10-CM | POA: Diagnosis not present

## 2016-12-17 DIAGNOSIS — K219 Gastro-esophageal reflux disease without esophagitis: Secondary | ICD-10-CM | POA: Insufficient documentation

## 2016-12-17 DIAGNOSIS — Z950 Presence of cardiac pacemaker: Secondary | ICD-10-CM | POA: Insufficient documentation

## 2016-12-17 DIAGNOSIS — I517 Cardiomegaly: Secondary | ICD-10-CM | POA: Diagnosis not present

## 2016-12-17 DIAGNOSIS — K225 Diverticulum of esophagus, acquired: Secondary | ICD-10-CM | POA: Insufficient documentation

## 2016-12-19 DIAGNOSIS — R0602 Shortness of breath: Secondary | ICD-10-CM | POA: Diagnosis not present

## 2016-12-22 DIAGNOSIS — F325 Major depressive disorder, single episode, in full remission: Secondary | ICD-10-CM | POA: Diagnosis not present

## 2016-12-22 DIAGNOSIS — E119 Type 2 diabetes mellitus without complications: Secondary | ICD-10-CM | POA: Diagnosis not present

## 2016-12-22 DIAGNOSIS — Z130 Encounter for screening for diseases of the blood and blood-forming organs and certain disorders involving the immune mechanism: Secondary | ICD-10-CM | POA: Diagnosis not present

## 2016-12-22 DIAGNOSIS — I482 Chronic atrial fibrillation: Secondary | ICD-10-CM | POA: Diagnosis not present

## 2016-12-22 DIAGNOSIS — E538 Deficiency of other specified B group vitamins: Secondary | ICD-10-CM | POA: Diagnosis not present

## 2016-12-22 DIAGNOSIS — I10 Essential (primary) hypertension: Secondary | ICD-10-CM | POA: Diagnosis not present

## 2016-12-22 DIAGNOSIS — E559 Vitamin D deficiency, unspecified: Secondary | ICD-10-CM | POA: Diagnosis not present

## 2016-12-22 DIAGNOSIS — E782 Mixed hyperlipidemia: Secondary | ICD-10-CM | POA: Diagnosis not present

## 2016-12-22 DIAGNOSIS — Z1329 Encounter for screening for other suspected endocrine disorder: Secondary | ICD-10-CM | POA: Diagnosis not present

## 2016-12-23 DIAGNOSIS — I482 Chronic atrial fibrillation: Secondary | ICD-10-CM | POA: Diagnosis not present

## 2016-12-23 DIAGNOSIS — I2510 Atherosclerotic heart disease of native coronary artery without angina pectoris: Secondary | ICD-10-CM | POA: Diagnosis not present

## 2016-12-23 DIAGNOSIS — Z6841 Body Mass Index (BMI) 40.0 and over, adult: Secondary | ICD-10-CM | POA: Diagnosis not present

## 2016-12-23 DIAGNOSIS — I10 Essential (primary) hypertension: Secondary | ICD-10-CM | POA: Diagnosis not present

## 2017-01-07 DIAGNOSIS — Z7901 Long term (current) use of anticoagulants: Secondary | ICD-10-CM | POA: Diagnosis not present

## 2017-01-23 ENCOUNTER — Other Ambulatory Visit: Payer: Self-pay | Admitting: Urology

## 2017-01-23 DIAGNOSIS — R0602 Shortness of breath: Secondary | ICD-10-CM | POA: Diagnosis not present

## 2017-01-23 DIAGNOSIS — R351 Nocturia: Secondary | ICD-10-CM

## 2017-01-23 DIAGNOSIS — R079 Chest pain, unspecified: Secondary | ICD-10-CM | POA: Diagnosis not present

## 2017-01-25 ENCOUNTER — Other Ambulatory Visit: Payer: Self-pay

## 2017-01-25 ENCOUNTER — Encounter: Payer: Self-pay | Admitting: Emergency Medicine

## 2017-01-25 ENCOUNTER — Emergency Department
Admission: EM | Admit: 2017-01-25 | Discharge: 2017-01-25 | Disposition: A | Discharge status: Home / Self Care | Payer: PPO | Attending: Emergency Medicine | Admitting: Emergency Medicine

## 2017-01-25 ENCOUNTER — Emergency Department: Payer: PPO

## 2017-01-25 DIAGNOSIS — Z23 Encounter for immunization: Secondary | ICD-10-CM | POA: Insufficient documentation

## 2017-01-25 DIAGNOSIS — Z79899 Other long term (current) drug therapy: Secondary | ICD-10-CM | POA: Diagnosis not present

## 2017-01-25 DIAGNOSIS — Z7901 Long term (current) use of anticoagulants: Secondary | ICD-10-CM | POA: Diagnosis not present

## 2017-01-25 DIAGNOSIS — S0990XA Unspecified injury of head, initial encounter: Secondary | ICD-10-CM

## 2017-01-25 DIAGNOSIS — S50312A Abrasion of left elbow, initial encounter: Secondary | ICD-10-CM | POA: Insufficient documentation

## 2017-01-25 DIAGNOSIS — Z87891 Personal history of nicotine dependence: Secondary | ICD-10-CM | POA: Diagnosis not present

## 2017-01-25 DIAGNOSIS — Y939 Activity, unspecified: Secondary | ICD-10-CM | POA: Insufficient documentation

## 2017-01-25 DIAGNOSIS — Y999 Unspecified external cause status: Secondary | ICD-10-CM | POA: Insufficient documentation

## 2017-01-25 DIAGNOSIS — S59902A Unspecified injury of left elbow, initial encounter: Secondary | ICD-10-CM | POA: Diagnosis not present

## 2017-01-25 DIAGNOSIS — I110 Hypertensive heart disease with heart failure: Secondary | ICD-10-CM | POA: Insufficient documentation

## 2017-01-25 DIAGNOSIS — Z8673 Personal history of transient ischemic attack (TIA), and cerebral infarction without residual deficits: Secondary | ICD-10-CM | POA: Insufficient documentation

## 2017-01-25 DIAGNOSIS — S0083XA Contusion of other part of head, initial encounter: Secondary | ICD-10-CM | POA: Diagnosis not present

## 2017-01-25 DIAGNOSIS — W050XXA Fall from non-moving wheelchair, initial encounter: Secondary | ICD-10-CM | POA: Diagnosis not present

## 2017-01-25 DIAGNOSIS — I509 Heart failure, unspecified: Secondary | ICD-10-CM | POA: Diagnosis not present

## 2017-01-25 DIAGNOSIS — Y92481 Parking lot as the place of occurrence of the external cause: Secondary | ICD-10-CM | POA: Diagnosis not present

## 2017-01-25 DIAGNOSIS — S0993XA Unspecified injury of face, initial encounter: Secondary | ICD-10-CM | POA: Diagnosis not present

## 2017-01-25 DIAGNOSIS — R220 Localized swelling, mass and lump, head: Secondary | ICD-10-CM | POA: Diagnosis not present

## 2017-01-25 DIAGNOSIS — Z7984 Long term (current) use of oral hypoglycemic drugs: Secondary | ICD-10-CM | POA: Insufficient documentation

## 2017-01-25 DIAGNOSIS — T148XXA Other injury of unspecified body region, initial encounter: Secondary | ICD-10-CM | POA: Diagnosis not present

## 2017-01-25 DIAGNOSIS — E119 Type 2 diabetes mellitus without complications: Secondary | ICD-10-CM | POA: Diagnosis not present

## 2017-01-25 LAB — PROTIME-INR
INR: 2.48
Prothrombin Time: 26.6 seconds — ABNORMAL HIGH (ref 11.4–15.2)

## 2017-01-25 LAB — GLUCOSE, CAPILLARY: GLUCOSE-CAPILLARY: 106 mg/dL — AB (ref 65–99)

## 2017-01-25 MED ORDER — TETANUS-DIPHTH-ACELL PERTUSSIS 5-2.5-18.5 LF-MCG/0.5 IM SUSP
0.5000 mL | Freq: Once | INTRAMUSCULAR | Status: AC
  Administered 2017-01-25: 0.5 mL via INTRAMUSCULAR
  Filled 2017-01-25: qty 0.5

## 2017-01-25 MED ORDER — AMOXICILLIN-POT CLAVULANATE 875-125 MG PO TABS: 1.0000 | ORAL_TABLET | Freq: Two times a day (BID) | ORAL | 0 refills | Status: DC
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
## 2017-01-28 DIAGNOSIS — M25522 Pain in left elbow: Secondary | ICD-10-CM | POA: Diagnosis not present

## 2017-01-28 DIAGNOSIS — I482 Chronic atrial fibrillation: Secondary | ICD-10-CM | POA: Diagnosis not present

## 2017-01-28 DIAGNOSIS — M19022 Primary osteoarthritis, left elbow: Secondary | ICD-10-CM | POA: Diagnosis not present

## 2017-01-28 DIAGNOSIS — S0083XD Contusion of other part of head, subsequent encounter: Secondary | ICD-10-CM | POA: Diagnosis not present

## 2017-01-28 DIAGNOSIS — Z09 Encounter for follow-up examination after completed treatment for conditions other than malignant neoplasm: Secondary | ICD-10-CM | POA: Diagnosis not present

## 2017-02-04 DIAGNOSIS — I480 Paroxysmal atrial fibrillation: Secondary | ICD-10-CM | POA: Diagnosis not present

## 2017-02-17 DIAGNOSIS — Z7901 Long term (current) use of anticoagulants: Secondary | ICD-10-CM | POA: Diagnosis not present

## 2017-03-19 DIAGNOSIS — Z7901 Long term (current) use of anticoagulants: Secondary | ICD-10-CM | POA: Diagnosis not present

## 2017-04-20 DIAGNOSIS — Z7901 Long term (current) use of anticoagulants: Secondary | ICD-10-CM | POA: Diagnosis not present

## 2017-05-07 DIAGNOSIS — I2510 Atherosclerotic heart disease of native coronary artery without angina pectoris: Secondary | ICD-10-CM | POA: Diagnosis not present

## 2017-05-07 DIAGNOSIS — Z7901 Long term (current) use of anticoagulants: Secondary | ICD-10-CM | POA: Diagnosis not present

## 2017-05-07 DIAGNOSIS — I482 Chronic atrial fibrillation: Secondary | ICD-10-CM | POA: Diagnosis not present

## 2017-05-07 DIAGNOSIS — I10 Essential (primary) hypertension: Secondary | ICD-10-CM | POA: Diagnosis not present

## 2017-05-12 DIAGNOSIS — I482 Chronic atrial fibrillation: Secondary | ICD-10-CM | POA: Diagnosis not present

## 2017-05-12 DIAGNOSIS — R791 Abnormal coagulation profile: Secondary | ICD-10-CM | POA: Diagnosis not present

## 2017-05-20 DIAGNOSIS — R791 Abnormal coagulation profile: Secondary | ICD-10-CM | POA: Diagnosis not present

## 2017-06-08 DIAGNOSIS — R791 Abnormal coagulation profile: Secondary | ICD-10-CM | POA: Diagnosis not present

## 2017-07-08 DIAGNOSIS — Z7901 Long term (current) use of anticoagulants: Secondary | ICD-10-CM | POA: Diagnosis not present

## 2017-07-23 DIAGNOSIS — H903 Sensorineural hearing loss, bilateral: Secondary | ICD-10-CM | POA: Diagnosis not present

## 2017-07-23 DIAGNOSIS — H6123 Impacted cerumen, bilateral: Secondary | ICD-10-CM | POA: Diagnosis not present

## 2017-08-11 DIAGNOSIS — I480 Paroxysmal atrial fibrillation: Secondary | ICD-10-CM | POA: Diagnosis not present

## 2017-08-13 DIAGNOSIS — R791 Abnormal coagulation profile: Secondary | ICD-10-CM | POA: Diagnosis not present

## 2017-08-27 DIAGNOSIS — R791 Abnormal coagulation profile: Secondary | ICD-10-CM | POA: Diagnosis not present

## 2017-09-10 DIAGNOSIS — R8290 Unspecified abnormal findings in urine: Secondary | ICD-10-CM | POA: Diagnosis not present

## 2017-09-10 DIAGNOSIS — R791 Abnormal coagulation profile: Secondary | ICD-10-CM | POA: Diagnosis not present

## 2017-09-10 DIAGNOSIS — R309 Painful micturition, unspecified: Secondary | ICD-10-CM | POA: Diagnosis not present

## 2017-09-10 DIAGNOSIS — R350 Frequency of micturition: Secondary | ICD-10-CM | POA: Diagnosis not present

## 2017-09-14 ENCOUNTER — Ambulatory Visit (INDEPENDENT_AMBULATORY_CARE_PROVIDER_SITE_OTHER): Payer: PPO | Admitting: Obstetrics and Gynecology

## 2017-09-14 ENCOUNTER — Encounter: Payer: Self-pay | Admitting: Obstetrics and Gynecology

## 2017-09-14 VITALS — BP 130/90 | HR 87 | Ht 62.5 in | Wt 249.0 lb

## 2017-09-14 DIAGNOSIS — N3000 Acute cystitis without hematuria: Secondary | ICD-10-CM | POA: Diagnosis not present

## 2017-09-14 MED ORDER — NITROFURANTOIN MONOHYD MACRO 100 MG PO CAPS: 100.0000 mg | ORAL_CAPSULE | Freq: Two times a day (BID) | ORAL | 0 refills | Status: AC

## 2017-09-14 NOTE — Progress Notes (Signed)
Gracelyn Nurse, MD   Chief Complaint  Patient presents with  . Urinary Tract Infection    Painful urination, lower back pain, urinary incontinece     HPI:      [name] is a 77 y.o. No obstetric history on file. who LMP was No LMP recorded. Patient is postmenopausal., presents today for UTI sx of urinary frequency, dysuria, urinary odor, LBP, LLQ pain and worsening urin incontinence for the past 2 wks. No fever, vag sx. Saw PCP 09/10/17 for UA and sent for culture, awaiting results. Pt not started on any tx since UA essentially neg. Hx of frequent UTIs and microscopic hematuria since childhood. Also hx of urin incont. Has been eval by urology in past.    Past Medical History:  Diagnosis Date  . AICD (automatic cardioverter/defibrillator) present    PLACED BY  DR  Graciela Husbands 2008  . Angina    5-6 YRS AGO  . Anxiety   . Arthritis   . Atrial fibrillation (HCC)   . CHF (congestive heart failure) (HCC)   . Complication of anesthesia    states at times had a tough time waking up  . Diabetes mellitus   . Diverticulum of esophagus    large diverticulum in the middle third of the esophagus [facility] EGD '09)  . Dysrhythmia    A-FIB  . Fibromyalgia   . Headache(784.0)   . Heartburn   . History of stomach ulcers   . History of transient ischemic attack (TIA)    LAST ONE IN  2005  IN  [place], [place]  . HLD (hyperlipidemia)   . Hypertension   . Shortness of breath    EASING OFF NOW  . Sleep apnea    DOESN'T KNOW SETTINGS  . Stroke (cerebrum) (HCC)   . TIA (transient ischemic attack)     Past Surgical History:  Procedure Laterality Date  . ANTERIOR CERVICAL DECOMP/DISCECTOMY FUSION  04/09/2011   Procedure: ANTERIOR CERVICAL DECOMPRESSION/DISCECTOMY FUSION 1 LEVEL;  Surgeon: Temple Pacini, MD;  Location: [facility];  Service: Neurosurgery;  Laterality: N/A;  Cervical five-six Anterior Cervical Decompression and Fusion  . APPENDECTOMY    . BILATERAL OOPHORECTOMY     BENIGN  .  CARDIAC CATHETERIZATION     X 2    LAST ONE  2009  . CESAREAN SECTION     X  4  . CHOLECYSTECTOMY    . INSERT / REPLACE / REMOVE PACEMAKER     ST  JUDE  DEVICE  . JOINT REPLACEMENT     BIL  KNEES  . TONSILLECTOMY    . TUBAL LIGATION      Family History  Problem Relation Age of Onset  . Kidney disease Neg Hx   . Bladder Cancer Neg Hx     Social History   Socioeconomic History  . Marital status: Divorced    Spouse name: Not on file  . Number of children: Not on file  . Years of education: Not on file  . Highest education level: Not on file  Occupational History  . Not on file  Social Needs  . Financial resource strain: Not on file  . Food insecurity:    Worry: Not on file    Inability: Not on file  . Transportation needs:    Medical: Not on file    Non-medical: Not on file  Tobacco Use  . Smoking status: Former Smoker    Packs/day: 1.00  Years: 20.00    Pack years: 20.00    Types: Cigarettes    Last attempt to quit: 03/19/1991    Years since quitting: 26.5  . Smokeless tobacco: Never Used  Substance and Sexual Activity  . Alcohol use: No  . Drug use: No  . Sexual activity: Not Currently    Birth control/protection: None  Lifestyle  . Physical activity:    Days per week: Not on file    Minutes per session: Not on file  . Stress: Not on file  Relationships  . Social connections:    Talks on phone: Not on file    Gets together: Not on file    Attends religious service: Not on file    Active member of club or organization: Not on file    Attends meetings of clubs or organizations: Not on file    Relationship status: Not on file  . Intimate partner violence:    Fear of current or ex partner: Not on file    Emotionally abused: Not on file    Physically abused: Not on file    Forced sexual activity: Not on file  Other Topics Concern  . Not on file  Social History Narrative  . Not on file    Outpatient Medications Prior to Visit  Medication Sig  Dispense Refill  . alendronate (FOSAMAX) 70 MG tablet TAKE 1 TABLET BY MOUTH EVERY 7 DAYS TAKE FIRST THING IN THE MORNING WITH A FULL GLASS WATER DO NOT EAT OR DRINK ANYTHING ELSE OR LIE DOWN    . Cholecalciferol (VITAMIN D-1000 MAX ST) 1000 units tablet Take by mouth.    . Cyanocobalamin (RA VITAMIN B-12 TR) 1000 MCG TBCR Take by mouth.    Tery Sanfilippo. Docusate Calcium (STOOL SOFTENER PO) Take 1 tablet by mouth daily.    Marland Kitchen. gabapentin (NEURONTIN) 100 MG capsule TAKE 1 CAPSULE BY MOUTH EVERY NIGHT    . metFORMIN (GLUCOPHAGE) 500 MG tablet Take 500 mg by mouth 2 (two) times daily with a meal.    . omeprazole (PRILOSEC) 20 MG capsule Take 20 mg by mouth daily.    . pioglitazone (ACTOS) 15 MG tablet Take 15 mg by mouth daily.    . quinapril (ACCUPRIL) 40 MG tablet Take 40 mg by mouth daily.     . rosuvastatin (CRESTOR) 10 MG tablet Take 10 mg by mouth daily.    . sertraline (ZOLOFT) 100 MG tablet Take 100 mg by mouth daily.    Marland Kitchen. warfarin (COUMADIN) 2.5 MG tablet Take by mouth.    . B-D ULTRA-FINE 33 LANCETS MISC Use 1 each once daily.    . carvedilol (COREG) 12.5 MG tablet Take 12.5 mg by mouth 2 (two) times daily with a meal.    . conjugated estrogens (PREMARIN) vaginal cream Place 1 Applicatorful vaginally daily. Apply 0.5mg  (pea-sized amount)  just inside the vaginal introitus with a finger-tip every night for two weeks and then Monday, Wednesday and Friday nights. (Patient not taking: Reported on 09/14/2017) 30 g 12  . dicyclomine (BENTYL) 10 MG capsule Take by mouth.    . estradiol (ESTRACE VAGINAL) 0.1 MG/GM vaginal cream Apply 0.5mg  (pea-sized amount)  just inside the vaginal introitus with a finger-tip every night for two weeks and then Monday, Wednesday and Friday nights. (Patient not taking: Reported on 12/25/2015) 30 g 12  . fesoterodine (TOVIAZ) 4 MG TB24 tablet Take 8 mg by mouth daily.     . fesoterodine (TOVIAZ) 8 MG TB24 tablet Take 8  mg by mouth daily.    . fesoterodine (TOVIAZ) 8 MG TB24 tablet  Take 1 tablet (8 mg total) by mouth daily. (Patient not taking: Reported on 12/25/2015) 90 tablet 4  . furosemide (LASIX) 20 MG tablet Take by mouth.    Marland Kitchen. glucose blood (ONE TOUCH ULTRA TEST) test strip Use once daily. Use as instructed.    . hydrochlorothiazide (HYDRODIURIL) 25 MG tablet Take by mouth.    Marland Kitchen. HYDROcodone-acetaminophen (NORCO/VICODIN) 5-325 MG per tablet Limit 1 tablet by mouth per day or 2-3 times per day if tolerated (Patient not taking: Reported on 12/25/2015) 90 tablet 0  . nitrofurantoin (MACRODANTIN) 100 MG capsule Take 100 mg by mouth 2 (two) times daily.    Marland Kitchen. rOPINIRole (REQUIP) 1 MG tablet Take by mouth.    . solifenacin (VESICARE) 10 MG tablet Take 1 tablet (10 mg total) by mouth daily. (Patient not taking: Reported on 09/14/2017) 90 tablet 4  . topiramate (TOPAMAX) 25 MG tablet Take 75 mg by mouth daily. Fibromyalgia flare up    . traMADol (ULTRAM) 50 MG tablet Take 50 mg by mouth every 8 (eight) hours as needed. For pain    . traMADol (ULTRAM) 50 MG tablet Take 50 mg by mouth every 4 (four) hours as needed.    . triamcinolone cream (KENALOG) 0.1 % APPLY TO AFFECTED AREA(S) TWICE DAILY ASNEEDED. MAY USE FOR 7 TO 10 DAYS, THEN TAKE 7 DAYS OFF, MAY REPEAT AS NEEDED.    Marland Kitchen. warfarin (COUMADIN) 2 MG tablet Take 2 mg by mouth daily.    Marland Kitchen. amoxicillin-clavulanate (AUGMENTIN) 875-125 MG tablet Take 1 tablet by mouth 2 (two) times daily. 14 tablet 0  . warfarin (COUMADIN) 2 MG tablet Take 2 mg by mouth.     No facility-administered medications prior to visit.       ROS:  Review of Systems  Constitutional: Negative for fever.  Gastrointestinal: Positive for abdominal pain. Negative for blood in stool, constipation, diarrhea, nausea and vomiting.  Genitourinary: Positive for dysuria and frequency. Negative for dyspareunia, flank pain, hematuria, urgency, vaginal bleeding, vaginal discharge and vaginal pain.  Musculoskeletal: Negative for back pain.  Skin: Negative for rash.     OBJECTIVE:   Vitals:  BP 130/90   Pulse 87   Ht 5' 2.5" (1.588 m)   Wt 249 lb (112.9 kg)   BMI 44.82 kg/m   Physical Exam  Constitutional: She is oriented to person, place, and time. She appears well-developed.  Neck: Normal range of motion.  Pulmonary/Chest: Effort normal.  Abdominal: There is no CVA tenderness.  Neurological: She is alert and oriented to person, place, and time. No cranial nerve deficit.  Psychiatric: She has a normal mood and affect. Her behavior is normal. Judgment and thought content normal.  Vitals reviewed.   Results: Results for orders placed or performed in visit on 09/14/17 (from the past 24 hour(s))  POCT Urinalysis Dipstick     Status: Abnormal   Collection Time: 09/15/17  1:52 PM  Result Value Ref Range   Color, UA yellow    Clarity, UA clear    Glucose, UA Negative Negative   Bilirubin, UA neg    Ketones, UA neg    Spec Grav, UA 1.010 1.010 - 1.025   Blood, UA neg    pH, UA 6.5 5.0 - 8.0   Protein, UA Negative Negative   Urobilinogen, UA     Nitrite, UA neg    Leukocytes, UA Moderate (2+) (A) Negative  Appearance     Odor pos      Assessment/Plan: Acute cystitis without hematuria - Pos dip. Rx macrobid. Will check for C&S results tomorrow since not back today. F/u prn.   - Plan: POCT Urinalysis Dipstick, nitrofurantoin, macrocrystal-monohydrate, (MACROBID) 100 MG capsule    Meds ordered this encounter  Medications  . nitrofurantoin, macrocrystal-monohydrate, (MACROBID) 100 MG capsule    Sig: Take 1 capsule (100 mg total) by mouth 2 (two) times daily for 7 days.    Dispense:  14 capsule    Refill:  0    Order Specific Question:   Supervising Provider    Answer:   Nadara Mustard [[id number]]      Return if symptoms worsen or fail to improve.  Alicia B. Copland, PA-C 09/15/2017 1:55 PM   09/15/17 ADDENDUM:  Urine C&S shows klebsiella, responsive to macrobid. Pt aware. F/u with PCP prn.

## 2017-09-14 NOTE — Patient Instructions (Signed)
I value your feedback and entrusting us with your care. If you get a [facility] patient survey, I would appreciate you taking the time to let us know about your experience today. Thank you! 

## 2017-09-15 ENCOUNTER — Encounter: Payer: Self-pay | Admitting: Obstetrics and Gynecology

## 2017-09-15 LAB — POCT URINALYSIS DIPSTICK
Bilirubin, UA: NEGATIVE
Blood, UA: NEGATIVE
Glucose, UA: NEGATIVE
Ketones, UA: NEGATIVE
NITRITE UA: NEGATIVE
ODOR: POSITIVE
PROTEIN UA: NEGATIVE
SPEC GRAV UA: 1.01 (ref 1.010–1.025)
pH, UA: 6.5 (ref 5.0–8.0)

## 2017-11-10 DIAGNOSIS — Z7901 Long term (current) use of anticoagulants: Secondary | ICD-10-CM | POA: Diagnosis not present

## 2017-11-10 DIAGNOSIS — I480 Paroxysmal atrial fibrillation: Secondary | ICD-10-CM | POA: Diagnosis not present

## 2017-11-18 DIAGNOSIS — E782 Mixed hyperlipidemia: Secondary | ICD-10-CM | POA: Diagnosis not present

## 2017-11-18 DIAGNOSIS — G4733 Obstructive sleep apnea (adult) (pediatric): Secondary | ICD-10-CM | POA: Diagnosis not present

## 2017-11-18 DIAGNOSIS — R791 Abnormal coagulation profile: Secondary | ICD-10-CM | POA: Diagnosis not present

## 2017-11-18 DIAGNOSIS — Z6841 Body Mass Index (BMI) 40.0 and over, adult: Secondary | ICD-10-CM | POA: Diagnosis not present

## 2017-11-18 DIAGNOSIS — I2510 Atherosclerotic heart disease of native coronary artery without angina pectoris: Secondary | ICD-10-CM | POA: Diagnosis not present

## 2017-11-18 DIAGNOSIS — I482 Chronic atrial fibrillation, unspecified: Secondary | ICD-10-CM | POA: Diagnosis not present

## 2017-11-18 DIAGNOSIS — I10 Essential (primary) hypertension: Secondary | ICD-10-CM | POA: Diagnosis not present

## 2017-12-01 DIAGNOSIS — E782 Mixed hyperlipidemia: Secondary | ICD-10-CM | POA: Diagnosis not present

## 2017-12-01 DIAGNOSIS — R791 Abnormal coagulation profile: Secondary | ICD-10-CM | POA: Diagnosis not present

## 2017-12-15 DIAGNOSIS — Z7901 Long term (current) use of anticoagulants: Secondary | ICD-10-CM | POA: Diagnosis not present

## 2018-01-11 DIAGNOSIS — I2510 Atherosclerotic heart disease of native coronary artery without angina pectoris: Secondary | ICD-10-CM | POA: Diagnosis not present

## 2018-01-11 DIAGNOSIS — I10 Essential (primary) hypertension: Secondary | ICD-10-CM | POA: Diagnosis not present

## 2018-01-11 DIAGNOSIS — Z6841 Body Mass Index (BMI) 40.0 and over, adult: Secondary | ICD-10-CM | POA: Diagnosis not present

## 2018-01-11 DIAGNOSIS — E782 Mixed hyperlipidemia: Secondary | ICD-10-CM | POA: Diagnosis not present

## 2018-01-11 DIAGNOSIS — E119 Type 2 diabetes mellitus without complications: Secondary | ICD-10-CM | POA: Diagnosis not present

## 2018-01-11 DIAGNOSIS — I482 Chronic atrial fibrillation, unspecified: Secondary | ICD-10-CM | POA: Diagnosis not present

## 2018-01-11 DIAGNOSIS — F325 Major depressive disorder, single episode, in full remission: Secondary | ICD-10-CM | POA: Diagnosis not present

## 2018-01-14 DIAGNOSIS — Z7901 Long term (current) use of anticoagulants: Secondary | ICD-10-CM | POA: Diagnosis not present

## 2018-01-14 DIAGNOSIS — E119 Type 2 diabetes mellitus without complications: Secondary | ICD-10-CM | POA: Diagnosis not present

## 2018-01-14 DIAGNOSIS — E782 Mixed hyperlipidemia: Secondary | ICD-10-CM | POA: Diagnosis not present

## 2018-02-17 ENCOUNTER — Ambulatory Visit (INDEPENDENT_AMBULATORY_CARE_PROVIDER_SITE_OTHER): Payer: Medicare Other | Admitting: Urology

## 2018-02-17 ENCOUNTER — Encounter: Payer: Self-pay | Admitting: Urology

## 2018-02-17 VITALS — BP 116/68 | HR 89 | Ht 63.0 in | Wt 249.2 lb

## 2018-02-17 DIAGNOSIS — N3946 Mixed incontinence: Secondary | ICD-10-CM | POA: Diagnosis not present

## 2018-02-17 LAB — URINALYSIS, COMPLETE
BILIRUBIN UA: NEGATIVE
GLUCOSE, UA: NEGATIVE
Nitrite, UA: NEGATIVE
UUROB: 1 mg/dL (ref 0.2–1.0)
pH, UA: 5.5 (ref 5.0–7.5)

## 2018-02-17 LAB — MICROSCOPIC EXAMINATION

## 2018-02-17 MED ORDER — MIRABEGRON ER 50 MG PO TB24: 50.0000 mg | ORAL_TABLET | Freq: Every day | ORAL | 2 refills | Status: DC

## 2018-02-17 NOTE — Progress Notes (Signed)
02/17/2018 1:51 PM   [name] 11-10-1940 [id number]  Referring provider: Gracelyn Nurse, MD [address] [place], [place] [address]  Chief Complaint  Patient presents with  . mixed incontinence    HPI: 78 year old female presents for follow-up of urinary incontinence.  I had seen her in the past at [facility] and she was last seen here in 2017 by [facility].  She has mixed urinary incontinence with her urge symptoms much more bothersome than her stress related symptoms.  She had most recently been on Toviaz and ran out of the medication approximately 6 months ago however states since being off the medication her incontinence has not really changed.  She had a prior Botox injection which she states was not effective.  Denies recurrent UTIs.  She presently denies dysuria.  She has significant urge incontinence and states she changes diapers approximately 5 times per day.  Denies flank/abdominal/pelvic pain.  Denies gross hematuria.  She does have spinal stenosis and degenerative disc disease.   PMH: Past Medical History:  Diagnosis Date  . AICD (automatic cardioverter/defibrillator) present    PLACED BY  DR  Graciela Husbands 2008  . Angina    5-6 YRS AGO  . Anxiety   . Arthritis   . Atrial fibrillation (HCC)   . CHF (congestive heart failure) (HCC)   . Complication of anesthesia    states at times had a tough time waking up  . Diabetes mellitus   . Diverticulum of esophagus    large diverticulum in the middle third of the esophagus [facility] EGD '09)  . Dysrhythmia    A-FIB  . Fibromyalgia   . Headache(784.0)   . Heartburn   . History of stomach ulcers   . History of transient ischemic attack (TIA)    LAST ONE IN  2005  IN  [place], [place]  . HLD (hyperlipidemia)   . Hypertension   . Shortness of breath    EASING OFF NOW  . Sleep apnea    DOESN'T KNOW SETTINGS  . Stroke (cerebrum) (HCC)   . TIA (transient ischemic attack)     Surgical History: Past Surgical History:  Procedure  Laterality Date  . ANTERIOR CERVICAL DECOMP/DISCECTOMY FUSION  04/09/2011   Procedure: ANTERIOR CERVICAL DECOMPRESSION/DISCECTOMY FUSION 1 LEVEL;  Surgeon: Temple Pacini, MD;  Location: [facility];  Service: Neurosurgery;  Laterality: N/A;  Cervical five-six Anterior Cervical Decompression and Fusion  . APPENDECTOMY    . BILATERAL OOPHORECTOMY     BENIGN  . CARDIAC CATHETERIZATION     X 2    LAST ONE  2009  . CESAREAN SECTION     X  4  . CHOLECYSTECTOMY    . INSERT / REPLACE / REMOVE PACEMAKER     ST  JUDE  DEVICE  . JOINT REPLACEMENT     BIL  KNEES  . TONSILLECTOMY    . TUBAL LIGATION      Home Medications:  Allergies as of 02/17/2018      Reactions   Ciprofloxacin Hcl Itching   Rash and itching all over stomach and arms   Dopamine Nausea And Vomiting   Sulfa Antibiotics Rash   Sulfacetamide Sodium Rash   Tegaderm Ag Mesh [silver] Other (See Comments), Rash   Blisters and takes skin off      Medication List       Accurate as of February 17, 2018  1:51 PM. Always use your most recent med list.  alendronate 70 MG tablet Commonly known as:  FOSAMAX TAKE 1 TABLET BY MOUTH EVERY 7 DAYS TAKE FIRST THING IN THE MORNING WITH A FULL GLASS WATER DO NOT EAT OR DRINK ANYTHING ELSE OR LIE DOWN   B-D ULTRA-FINE 33 LANCETS Misc Use 1 each once daily.   carvedilol 12.5 MG tablet Commonly known as:  COREG Take 12.5 mg by mouth 2 (two) times daily with a meal.   dicyclomine 10 MG capsule Commonly known as:  BENTYL Take by mouth.   furosemide 20 MG tablet Commonly known as:  LASIX Take by mouth.   gabapentin 100 MG capsule Commonly known as:  NEURONTIN TAKE 1 CAPSULE BY MOUTH EVERY NIGHT   hydrochlorothiazide 25 MG tablet Commonly known as:  HYDRODIURIL Take by mouth.   metFORMIN 500 MG tablet Commonly known as:  GLUCOPHAGE Take 500 mg by mouth 2 (two) times daily with a meal.   omeprazole 20 MG capsule Commonly known as:  PRILOSEC Take 20 mg by mouth daily.     ONE TOUCH ULTRA TEST test strip Generic drug:  glucose blood Use once daily. Use as instructed.   pioglitazone 15 MG tablet Commonly known as:  ACTOS Take 15 mg by mouth daily.   quinapril 40 MG tablet Commonly known as:  ACCUPRIL Take 40 mg by mouth daily.   RA VITAMIN B-12 TR 1000 MCG Tbcr Generic drug:  Cyanocobalamin Take by mouth.   rosuvastatin 10 MG tablet Commonly known as:  CRESTOR Take 10 mg by mouth daily.   sertraline 100 MG tablet Commonly known as:  ZOLOFT Take 100 mg by mouth daily.   triamcinolone cream 0.1 % Commonly known as:  KENALOG APPLY TO AFFECTED AREA(S) TWICE DAILY ASNEEDED. MAY USE FOR 7 TO 10 DAYS, THEN TAKE 7 DAYS OFF, MAY REPEAT AS NEEDED.   VITAMIN D-1000 MAX ST 25 MCG (1000 UT) tablet Generic drug:  Cholecalciferol Take by mouth.   warfarin 2 MG tablet Commonly known as:  COUMADIN Take 2 mg by mouth daily.       Allergies:  Allergies  Allergen Reactions  . Ciprofloxacin Hcl Itching    Rash and itching all over stomach and arms  . Dopamine Nausea And Vomiting  . Sulfa Antibiotics Rash  . Sulfacetamide Sodium Rash  . Tegaderm Ag Mesh [Silver] Other (See Comments) and Rash    Blisters and takes skin off    Family History: Family History  Problem Relation Age of Onset  . Kidney disease Neg Hx   . Bladder Cancer Neg Hx     Social History:  reports that she quit smoking about 26 years ago. Her smoking use included cigarettes. She has a 20.00 pack-year smoking history. She has never used smokeless tobacco. She reports that she does not drink alcohol or use drugs.  ROS: UROLOGY Frequent Urination?: Yes Hard to postpone urination?: Yes Burning/pain with urination?: No Get up at night to urinate?: Yes Leakage of urine?: Yes Urine stream starts and stops?: No Trouble starting stream?: No Do you have to strain to urinate?: No Blood in urine?: Yes Urinary tract infection?: No Sexually transmitted disease?: No Injury to  kidneys or bladder?: No Painful intercourse?: No Weak stream?: No Currently pregnant?: No Vaginal bleeding?: No Last menstrual period?: Postmenopausal  Gastrointestinal Nausea?: No Vomiting?: No Indigestion/heartburn?: No Diarrhea?: Yes Constipation?: No  Constitutional Fever: No Night sweats?: No Weight loss?: No Fatigue?: No  Skin Skin rash/lesions?: No Itching?: No  Eyes Blurred vision?: No Double vision?: No  Ears/Nose/Throat Sore throat?: No Sinus problems?: Yes  Hematologic/Lymphatic Swollen glands?: No Easy bruising?: No  Cardiovascular Leg swelling?: No Chest pain?: No  Respiratory Cough?: Yes Shortness of breath?: Yes  Endocrine Excessive thirst?: No  Musculoskeletal Back pain?: Yes Joint pain?: Yes  Neurological Headaches?: No Dizziness?: No  Psychologic Depression?: Yes Anxiety?: No  Physical Exam: BP 116/68 (BP Location: Left Arm, Patient Position: Sitting, Cuff Size: Large)   Pulse 89   Ht 5\' 3"  (1.6 m)   Wt 249 lb 3.2 oz (113 kg)   BMI 44.14 kg/m   Constitutional:  Alert and oriented, No acute distress. HEENT: [place] AT, moist mucus membranes.  Trachea midline, no masses. Cardiovascular: No clubbing, cyanosis, or edema. Respiratory: Normal respiratory effort, no increased work of breathing. GI: Abdomen is soft, nontender, nondistended, no abdominal masses GU: No CVA tenderness Lymph: No cervical or inguinal lymphadenopathy. Skin: No rashes, bruises or suspicious lesions. Neurologic: Grossly intact, no focal deficits, moving all 4 extremities. Psychiatric: Normal mood and affect.   Urinalysis Dipstick 1+ leukocytes/1+ protein Microscopy 6-10 WBC  Assessment & Plan:    1. Mixed incontinence Her urge symptoms are by far much more bothersome.  She is presently off all OAB medications.  Will give a trial of Myrbetriq 50 mg daily.  Samples were given. We will ask her to follow-up with Dr. Sherron MondayMacDiarmid in 4 to 6 weeks to see if he  has any other recommendations.    Riki AltesScott C Izaan Kingbird, MD  [facility] [address], [address] [place], [place] [address] [phone]

## 2018-03-16 ENCOUNTER — Other Ambulatory Visit
Admission: RE | Admit: 2018-03-16 | Discharge: 2018-03-16 | Disposition: A | Discharge status: Home / Self Care | Payer: Medicare Other | Source: Ambulatory Visit | Attending: Student | Admitting: Student

## 2018-03-16 DIAGNOSIS — K529 Noninfective gastroenteritis and colitis, unspecified: Secondary | ICD-10-CM | POA: Insufficient documentation

## 2018-03-16 LAB — GASTROINTESTINAL PANEL BY PCR, STOOL (REPLACES STOOL CULTURE)
ADENOVIRUS F40/41: NOT DETECTED
ASTROVIRUS: NOT DETECTED
CAMPYLOBACTER SPECIES: NOT DETECTED
CRYPTOSPORIDIUM: NOT DETECTED
Cyclospora cayetanensis: NOT DETECTED
ENTEROAGGREGATIVE E COLI (EAEC): NOT DETECTED
ENTEROPATHOGENIC E COLI (EPEC): NOT DETECTED
Entamoeba histolytica: NOT DETECTED
Enterotoxigenic E coli (ETEC): NOT DETECTED
GIARDIA LAMBLIA: NOT DETECTED
NOROVIRUS GI/GII: NOT DETECTED
Plesimonas shigelloides: NOT DETECTED
ROTAVIRUS A: NOT DETECTED
SALMONELLA SPECIES: NOT DETECTED
Sapovirus (I, II, IV, and V): NOT DETECTED
Shiga like toxin producing E coli (STEC): NOT DETECTED
Shigella/Enteroinvasive E coli (EIEC): NOT DETECTED
VIBRIO SPECIES: NOT DETECTED
Vibrio cholerae: NOT DETECTED
YERSINIA ENTEROCOLITICA: NOT DETECTED

## 2018-03-16 LAB — C DIFFICILE QUICK SCREEN W PCR REFLEX
C Diff antigen: NEGATIVE
C Diff interpretation: NOT DETECTED
C Diff toxin: NEGATIVE

## 2018-03-17 LAB — CALPROTECTIN, FECAL: CALPROTECTIN, FECAL: 43 ug/g (ref 0–120)

## 2018-03-19 LAB — PANCREATIC ELASTASE, FECAL: Pancreatic Elastase-1, Stool: 373 ug Elast./g (ref >200)

## 2018-04-12 ENCOUNTER — Ambulatory Visit (INDEPENDENT_AMBULATORY_CARE_PROVIDER_SITE_OTHER): Payer: Medicare Other | Admitting: Urology

## 2018-04-12 ENCOUNTER — Encounter: Payer: Self-pay | Admitting: Urology

## 2018-04-12 VITALS — BP 127/71 | HR 83 | Ht 63.0 in | Wt 256.0 lb

## 2018-04-12 DIAGNOSIS — N3946 Mixed incontinence: Secondary | ICD-10-CM

## 2018-04-12 NOTE — Progress Notes (Signed)
04/12/2018 3:05 PM   [name] [birth year] [id number]  Referring provider: Gracelyn Nurse, MD [address] [place], [place] [address]  Chief Complaint  Patient presents with  . Mixed incontinence    HPI: [name] 2017: Was given Vesicare.  Is known to have CPAP and nocturia.  Had a history of microscopic hematuria.  Saw Dr. Lonna Cobb in January 2020.  She may have had mixed results or little benefit from [place] and has described a failed Botox treatment perhaps at [facility].  Was started on Myrbetriq  Today She leaks with coughing sneezing sometimes bending and lifting.  She has urge incontinence.  Both are significant.  She can go from a sitting to standing position and urine runs on her leg.  She has high-volume bedwetting.  She soaks 5 pads a day.  She voids every 1 or 2 hours during the day and has no nocturia  She has failed Myrbetriq Toviaz and Botox.  She has had 2 TIAs.  She is on oral hypoglycemics  She has loose bowel movements.  She has not had a hysterectomy.  She uses a cane for arthritis.  Modifying factors: There are no other modifying factors  Associated signs and symptoms: There are no other associated signs and symptoms Aggravating and relieving factors: There are no other aggravating or relieving factors Severity: Moderate Duration: Persistent  PMH: Past Medical History:  Diagnosis Date  . AICD (automatic cardioverter/defibrillator) present    PLACED BY  DR  Graciela Husbands 2008  . Angina    5-6 YRS AGO  . Anxiety   . Arthritis   . Atrial fibrillation (HCC)   . CHF (congestive heart failure) (HCC)   . Complication of anesthesia    states at times had a tough time waking up  . Diabetes mellitus   . Diverticulum of esophagus    large diverticulum in the middle third of the esophagus [facility] EGD '09)  . Dysrhythmia    A-FIB  . Fibromyalgia   . Headache(784.0)   . Heartburn   . History of stomach ulcers   . History of transient ischemic attack (TIA)    LAST ONE IN  2005  IN  [place], [place]  . HLD (hyperlipidemia)   . Hypertension   . Shortness of breath    EASING OFF NOW  . Sleep apnea    DOESN'T KNOW SETTINGS  . Stroke (cerebrum) (HCC)   . TIA (transient ischemic attack)     Surgical History: Past Surgical History:  Procedure Laterality Date  . ANTERIOR CERVICAL DECOMP/DISCECTOMY FUSION  04/09/2011   Procedure: ANTERIOR CERVICAL DECOMPRESSION/DISCECTOMY FUSION 1 LEVEL;  Surgeon: Temple Pacini, MD;  Location: [facility];  Service: Neurosurgery;  Laterality: N/A;  Cervical five-six Anterior Cervical Decompression and Fusion  . APPENDECTOMY    . BILATERAL OOPHORECTOMY     BENIGN  . CARDIAC CATHETERIZATION     X 2    LAST ONE  2009  . CESAREAN SECTION     X  4  . CHOLECYSTECTOMY    . INSERT / REPLACE / REMOVE PACEMAKER     ST  JUDE  DEVICE  . JOINT REPLACEMENT     BIL  KNEES  . TONSILLECTOMY    . TUBAL LIGATION      Home Medications:  Allergies as of 04/12/2018      Reactions   Ciprofloxacin Hcl Itching   Rash and itching all over stomach and arms   Dopamine Nausea And Vomiting  Sulfa Antibiotics Rash   Sulfacetamide Sodium Rash   Tegaderm Ag Mesh [silver] Other (See Comments), Rash   Blisters and takes skin off      Medication List       Accurate as of April 12, 2018  3:05 PM. Always use your most recent med list.        alendronate 70 MG tablet Commonly known as:  FOSAMAX TAKE 1 TABLET BY MOUTH EVERY 7 DAYS TAKE FIRST THING IN THE MORNING WITH A FULL GLASS WATER DO NOT EAT OR DRINK ANYTHING ELSE OR LIE DOWN   B-D ULTRA-FINE 33 LANCETS Misc Use 1 each once daily.   carvedilol 12.5 MG tablet Commonly known as:  COREG Take 12.5 mg by mouth 2 (two) times daily with a meal.   dicyclomine 10 MG capsule Commonly known as:  BENTYL Take by mouth.   furosemide 20 MG tablet Commonly known as:  LASIX Take by mouth.   gabapentin 100 MG capsule Commonly known as:  NEURONTIN TAKE 1 CAPSULE BY MOUTH EVERY NIGHT     hydrochlorothiazide 25 MG tablet Commonly known as:  HYDRODIURIL Take by mouth.   metFORMIN 500 MG tablet Commonly known as:  GLUCOPHAGE Take 500 mg by mouth 2 (two) times daily with a meal.   mirabegron ER 50 MG Tb24 tablet Commonly known as:  MYRBETRIQ Take 1 tablet (50 mg total) by mouth daily.   omeprazole 20 MG capsule Commonly known as:  PRILOSEC Take 20 mg by mouth daily.   ONE TOUCH ULTRA TEST test strip Generic drug:  glucose blood Use once daily. Use as instructed.   pioglitazone 15 MG tablet Commonly known as:  ACTOS Take 15 mg by mouth daily.   polyethylene glycol-electrolytes 420 g solution Commonly known as:  NuLYTELY/GoLYTELY Take by mouth.   quinapril 40 MG tablet Commonly known as:  ACCUPRIL Take 40 mg by mouth daily.   RA Vitamin B-12 TR 1000 MCG Tbcr Generic drug:  Cyanocobalamin Take by mouth.   rosuvastatin 10 MG tablet Commonly known as:  CRESTOR Take 10 mg by mouth daily.   sertraline 100 MG tablet Commonly known as:  ZOLOFT Take 100 mg by mouth daily.   triamcinolone cream 0.1 % Commonly known as:  KENALOG APPLY TO AFFECTED AREA(S) TWICE DAILY ASNEEDED. MAY USE FOR 7 TO 10 DAYS, THEN TAKE 7 DAYS OFF, MAY REPEAT AS NEEDED.   Vitamin D-1000 Max St 25 MCG (1000 UT) tablet Generic drug:  Cholecalciferol Take by mouth.   warfarin 2 MG tablet Commonly known as:  COUMADIN Take 2 mg by mouth daily.       Allergies:  Allergies  Allergen Reactions  . Ciprofloxacin Hcl Itching    Rash and itching all over stomach and arms  . Dopamine Nausea And Vomiting  . Sulfa Antibiotics Rash  . Sulfacetamide Sodium Rash  . Tegaderm Ag Mesh [Silver] Other (See Comments) and Rash    Blisters and takes skin off    Family History: Family History  Problem Relation Age of Onset  . Kidney disease Neg Hx   . Bladder Cancer Neg Hx     Social History:  reports that she quit smoking about 27 years ago. Her smoking use included cigarettes. She has a  20.00 pack-year smoking history. She has never used smokeless tobacco. She reports that she does not drink alcohol or use drugs.  ROS:  Physical Exam: There were no vitals taken for this visit.  Constitutional:  Alert and oriented, No acute distress. HEENT: [place] AT, moist mucus membranes.  Trachea midline, no masses. Cardiovascular: No clubbing, cyanosis, or edema. Respiratory: Normal respiratory effort, no increased work of breathing. GI: Abdomen is soft, nontender, nondistended, no abdominal masses GU: Sam a little bit limited by obesity.  Well supported bladder neck and a negative cough test with no prolapse Skin: No rashes, bruises or suspicious lesions. Lymph: No cervical or inguinal adenopathy. Neurologic: Grossly intact, no focal deficits, moving all 4 extremities. Psychiatric: Normal mood and affect.  Laboratory Data: Lab Results  Component Value Date   WBC 11.0 (H) 01/20/2013   HGB 12.0 01/20/2013   HCT 36.4 01/20/2013   MCV 93.3 01/20/2013   PLT 205 01/20/2013    Lab Results  Component Value Date   CREATININE 0.74 01/20/2013    No results found for: PSA  No results found for: TESTOSTERONE  No results found for: HGBA1C  Urinalysis    Component Value Date/Time   COLORURINE Yellow 12/07/2012 1209   APPEARANCEUR Cloudy (A) 02/17/2018 1247   LABSPEC 1.012 12/07/2012 1209   PHURINE 6.0 12/07/2012 1209   GLUCOSEU Negative 02/17/2018 1247   GLUCOSEU Negative 12/07/2012 1209   HGBUR 1+ 12/07/2012 1209   BILIRUBINUR Negative 02/17/2018 1247   BILIRUBINUR Negative 12/07/2012 1209   KETONESUR Negative 12/07/2012 1209   PROTEINUR 1+ (A) 02/17/2018 1247   PROTEINUR Negative 12/07/2012 1209   NITRITE Negative 02/17/2018 1247   NITRITE Positive 12/07/2012 1209   LEUKOCYTESUR 1+ (A) 02/17/2018 1247   LEUKOCYTESUR 1+ 12/07/2012 1209    Pertinent Imaging:   Assessment & Plan: Patient has high-volume  mixed incontinence and bedwetting.  The role of urodynamics discussed.  I think would be a good idea to perform cystoscopy.  Botox was in 2016.  I would not be surprised based on pelvic examination the primary problem is an overactive bladder.  Urodynamics ordered  There are no diagnoses linked to this encounter.  No follow-ups on file.  Martina Sinner, MD  [facility] [address] [place], [place] [address] [phone]

## 2018-05-05 ENCOUNTER — Ambulatory Visit
Admission: RE | Admit: 2018-05-05 | Payer: Medicare Other | Source: Home / Self Care | Admitting: Unknown Physician Specialty

## 2018-05-05 ENCOUNTER — Encounter: Admission: RE | Payer: Self-pay | Source: Home / Self Care

## 2018-05-05 SURGERY — ESOPHAGOGASTRODUODENOSCOPY (EGD) WITH PROPOFOL
Anesthesia: General

## 2018-05-06 ENCOUNTER — Ambulatory Visit: Payer: Medicare Other | Admitting: Physical Therapy

## 2018-05-11 ENCOUNTER — Encounter: Payer: Medicare Other | Admitting: Physical Therapy

## 2018-05-20 ENCOUNTER — Encounter: Payer: Medicare Other | Admitting: Physical Therapy

## 2018-05-27 ENCOUNTER — Encounter: Payer: Medicare Other | Admitting: Physical Therapy

## 2018-06-03 ENCOUNTER — Encounter: Payer: Medicare Other | Admitting: Physical Therapy

## 2018-06-10 ENCOUNTER — Encounter: Payer: Medicare Other | Admitting: Physical Therapy

## 2018-06-17 ENCOUNTER — Encounter: Payer: Medicare Other | Admitting: Physical Therapy

## 2018-06-24 ENCOUNTER — Encounter: Payer: Medicare Other | Admitting: Physical Therapy

## 2018-07-01 ENCOUNTER — Encounter: Payer: Medicare Other | Admitting: Physical Therapy

## 2018-07-08 ENCOUNTER — Encounter: Payer: Medicare Other | Admitting: Physical Therapy

## 2018-07-15 ENCOUNTER — Encounter: Payer: Medicare Other | Admitting: Physical Therapy

## 2018-07-22 ENCOUNTER — Encounter: Payer: Medicare Other | Admitting: Physical Therapy

## 2018-07-26 ENCOUNTER — Ambulatory Visit: Payer: Medicare Other | Admitting: Physical Therapy

## 2018-07-29 ENCOUNTER — Encounter: Payer: Medicare Other | Admitting: Physical Therapy

## 2018-08-02 ENCOUNTER — Ambulatory Visit: Payer: Medicare Other | Attending: Student | Admitting: Physical Therapy

## 2018-08-02 ENCOUNTER — Other Ambulatory Visit: Payer: Self-pay

## 2018-08-02 DIAGNOSIS — M6281 Muscle weakness (generalized): Secondary | ICD-10-CM | POA: Insufficient documentation

## 2018-08-02 DIAGNOSIS — M25511 Pain in right shoulder: Secondary | ICD-10-CM | POA: Insufficient documentation

## 2018-08-02 DIAGNOSIS — M545 Low back pain, unspecified: Secondary | ICD-10-CM

## 2018-08-02 DIAGNOSIS — G8929 Other chronic pain: Secondary | ICD-10-CM | POA: Insufficient documentation

## 2018-08-02 DIAGNOSIS — R278 Other lack of coordination: Secondary | ICD-10-CM | POA: Insufficient documentation

## 2018-08-02 NOTE — Patient Instructions (Addendum)
Bladder irritant to water change:   Decrease tea ( 12 fl) and coffee ( 12 fl oz) to 6 fl each   Bladder irritant - 12 fl total   Increase water from 1 - 12 fl oz to 2 Water intake total 24 fl.    _______________  Sit more often in regular chair and less time in the push up chair  Proper body mechanics with getting out of a chair to decrease strain  on back &pelvic floor   Avoid holding your breath when Getting out of the chair:  Scoot to front part of chair chair Heels behind feet, feet are hip width apart, nose over toes  Inhale like you are smelling roses Exhale to stand  _________________  [name] with proper breathing:  Inhale 1-2 pause ,  ribcage expand, feel pelvic floor lower like bowl Exhale 2- 1 pause  Then small quick pelvic squeeze ( not with thighs nor gluts)   10 reps seated 3 x time   _________________

## 2018-08-02 NOTE — Therapy (Signed)
[place] [facility] [address] [place], [place], [address] Phone: [phone]   Fax:  [phone]  Physical Therapy Evaluation  Patient Details  Name: [name] MRN: [id number] Date of Birth: 06/18/1940 No data recorded  Encounter Date: 08/02/2018  PT End of Session - 08/02/18 1508    Visit Number  1    Number of Visits  10    Date for PT Re-Evaluation  10/11/18    Authorization Type  Medicare    PT Start Time  1400    PT Stop Time  1503    PT Time Calculation (min)  63 min    Activity Tolerance  Patient tolerated treatment well;No increased pain       Past Medical History:  Diagnosis Date  . AICD (automatic cardioverter/defibrillator) present    PLACED BY  DR  Graciela HusbandsKLEIN 2008  . Angina    5-6 YRS AGO  . Anxiety   . Arthritis   . Atrial fibrillation (HCC)   . CHF (congestive heart failure) (HCC)   . Complication of anesthesia    states at times had a tough time waking up  . Diabetes mellitus   . Diverticulum of esophagus    large diverticulum in the middle third of the esophagus [facility] '09[facility]  . Dysrhythmia    A-FIB  . Fibromyalgia   . Headache(784.0)   . Heartburn   . History of stomach ulcers   . History of transient ischemic attack (TIA)    LAST ONE IN  2005  [place]  . HLD (hyperlipidemia)   . Hypertension   . Shortness of breath    EASING OFF NOW  . Sleep apnea    DOESN'T KNOW SETTINGS  . Stroke (cerebrum) (HCC)   . TIA (transient ischemic attack)     Past Surgical History:  Procedure Laterality Date  . ANTERIOR CERVICAL DECOMP/DISCECTOMY FUSION  04/09/2011   Procedure: ANTERIOR CERVICAL DECOMPRESSION/DISCECTOMY FUSION 1 LEVEL;  Surgeon: Temple PaciniHenry A Pool, MD;  Location: [facility];  Service: Neurosurgery;  Laterality: N/A;  Cervical five-six Anterior Cervical Decompression and Fusion  . APPENDECTOMY    . BILATERAL OOPHORECTOMY     BENIGN  . CARDIAC CATHETERIZATION     X 2    LAST ONE  2009   . CESAREAN SECTION     X  4  . CHOLECYSTECTOMY    . INSERT / REPLACE / REMOVE PACEMAKER     ST  JUDE  DEVICE  . JOINT REPLACEMENT     BIL  KNEES  . TONSILLECTOMY    . TUBAL LIGATION      There were no vitals filed for this visit.   Subjective Assessment - 08/02/18 1409    Subjective  1) Urinary leakage occuring all the time. But the time she feels she has to get up out of bed or chair. she will have leakage.  5 diapers a day, 3 diapers on a good day.  Daily fluid intake: (1) 12 fl oz water, no soda, 12 fl oz coffee, 12 fl oz tea.     Nocturia 1-3 x night. Had OSA but two years ago, pt had a sleep study and they sadi her apnea not bad enough for a machine. Pt used CPAP  for 20 years.   2) ocassional fecal leakage. 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 Denied radiating pain.  8/10  at its worst. 1-2 months ago, she"threw her back out". Pt called her doctor who gave her muscle relaxers and it took away the pain.    4) R shoulder pain for the last 2-3 months. Hurts with putting dishes, pushing off chair to stand, Pt has been using a chair that lifts her up so she doe snot have to push off her arms. Pt also uses L arm more to do things. Pt used to sleep on her R side but now she sleeps on the left.    Pertinent History  4 C-sections,  2 miscarriages,  Pt has been doing her kegels for teh past 2-3 years which she learned from her sister. This has not helped but she does them. She clenches her muscles and hold 3-4 sec for 3-4 reps.  Pt is considering bladder surgery and also gastric bypass. 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         [facility] PT Assessment - 08/02/18 1503      Assessment   Medical Diagnosis  chronic diarrhea, incontinence of feces      Balance Screen   Has the patient fallen in the past 6 months  No    How many times?  --      Prior Function   Level of Independence  Independent;Independent with community mobility with  device   rollator     Observation/Other Assessments   Observations  SOB with ambulation from waiting area to PT room, log rolling       Strength   Overall Strength Comments  hip flex B 3-/5, knee ext 3+/5 B, knee flex 4-/5 B        Palpation   SI assessment   levelled iliac crest, L SIJ slightly more posterior     Palpation comment  moderate scar restrictions at suprapubic area       Bed Mobility   Bed Mobility  --   shoulder rolling     Transfers   Comments  pre Tx: breathholding, modified to avoid shoulder pain.  Post Tx with cue for deep core coordination, pt demo'd no UE support and no breathholding                 Objective measurements completed on examination: See above findings.    Pelvic Floor Special Questions - 08/02/18 1502    Diastasis Recti  neg                  PT Long Term Goals - 08/02/18 1517      PT LONG TERM GOAL #1   Title  Pt will decrease her PFIQ-7: from 27% to < 17% in order to restore pelvic floor function in functional activities    Time  10    Period  Weeks    Status  New    Target Date  10/11/18      PT LONG TERM GOAL #2   Title  Pt will decrease her ODI score from 28% to < 18% in order to return to ADLs with less back pain    Time  10    Period  Weeks    Status  New    Target Date  10/11/18      PT LONG TERM GOAL #3   Title  Pt will demo proper deep core coordination with proper diaphragmatic excursion in order to optimize postural stability and pelvic floor function    Time  4    Period  Weeks    Status  New    Target Date  08/30/18      PT LONG TERM GOAL #4   Title  Pt will demo improved scar mobility over  the abdomen and demo improved fascial mobility for optimal deep core coordination and motility    Time  2    Period  Weeks    Status  New    Target Date  08/16/18      PT LONG TERM GOAL #5   Title  Pt will demo co-ordination of deep core with against gravity activities ( sit-to -stand, walking,  bending) in order to minimize back pain and pelvic floor dysfunction    Time  8    Period  Weeks    Status  New    Target Date  09/27/18             Plan - 08/02/18 1508    Clinical Impression Statement  Pt is a 78 yo female who reports of fecal and urinary leakage, CLBP, and R shoulder pain. Pt's deficits impact her functional mobility and QOL. Pt 's clinical presentations include SOB with short distance ambulation with rollator and with logrolling, slowed gait, breathholding with sit to stand transfer, overuse with glut/ adductor mm with pelvic floor contractions, dyscoordination of deep core mm, BLE weakness, scar restrictions in suprapubic region, and pelvic obliquities. Pt also decreased water intake, increased bladder irritant intake which impacts her Sx. Plan assess R shoulder next session.  Following training post Tx, pt demo'd less strain on her pelvic floor with co-activation techniques with deep core. Pt was able to demo sit to stand without UE support post Tx. Following manual Tx on releasing low abdominal scars, pt demo'd less scar restrictions. Pt continues to benefit from skilled PT.      Personal Factors and Comorbidities  Age;Comorbidity 3+    Comorbidities  Fibromyalgia, A-fib, Diabetes, TIA, CHF, DDD   Examination-Activity Limitations  Bed Mobility;Toileting;Stand;Transfers;Continence;Stairs;Bend    Stability/Clinical Decision Making  Evolving/Moderate complexity    Clinical Decision Making  Moderate    Rehab Potential  Good    PT Frequency  1x / week    PT Duration  --   10   PT Treatment/Interventions  Gait training;Stair training;Neuromuscular re-education;Moist Heat;Traction;Therapeutic activities;Patient/family education;Cryotherapy;Therapeutic exercise;Energy conservation;Functional mobility training;Scar mobilization;Taping;Manual techniques;Balance training    Consulted and Agree with Plan of Care  Patient       Patient will benefit from skilled therapeutic  intervention in order to improve the following deficits and impairments:  Increased muscle spasms, Postural dysfunction, Decreased range of motion, Decreased endurance, Decreased balance, Difficulty walking, Impaired flexibility, Decreased safety awareness, Obesity, Impaired UE functional use, Hypomobility, Decreased strength, Decreased scar mobility, Decreased mobility, Decreased coordination, Improper body mechanics, Pain, Abnormal gait  Visit Diagnosis: 1. Other lack of coordination   2. Chronic right shoulder pain   3. Muscle weakness (generalized)   4. Chronic low back pain without sciatica, unspecified back pain laterality        Problem List Patient Active Problem List   Diagnosis Date Noted  . 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 Piriformis syndrome 06/18/2014  . Facet syndrome, lumbar 06/18/2014  . 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 DDD (degenerative disc disease), lumbar 06/18/2014  . DDD (degenerative disc disease), cervical 06/08/2014  . 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 Cervical spinal stenosis 04/09/2011    Mariane MastersYeung,Shin Yiing ,PT, DPT, E-RYT  08/02/2018, 4:18 PM   [facility] [address] [place], [place], [address] Phone: [phone]   Fax:  [phone]  Name: Heather LoftySandra J Burton MRN: [id number] Date of Birth: 11/22/1940

## 2018-08-05 ENCOUNTER — Encounter: Payer: Medicare Other | Admitting: Physical Therapy

## 2018-08-09 ENCOUNTER — Ambulatory Visit: Payer: Medicare Other | Attending: Student | Admitting: Physical Therapy

## 2018-08-09 ENCOUNTER — Other Ambulatory Visit: Payer: Self-pay

## 2018-08-09 DIAGNOSIS — M6281 Muscle weakness (generalized): Secondary | ICD-10-CM | POA: Diagnosis present

## 2018-08-09 DIAGNOSIS — R278 Other lack of coordination: Secondary | ICD-10-CM | POA: Diagnosis present

## 2018-08-09 DIAGNOSIS — M25511 Pain in right shoulder: Secondary | ICD-10-CM | POA: Diagnosis not present

## 2018-08-09 DIAGNOSIS — M545 Low back pain, unspecified: Secondary | ICD-10-CM

## 2018-08-09 DIAGNOSIS — G8929 Other chronic pain: Secondary | ICD-10-CM | POA: Diagnosis present

## 2018-08-09 NOTE — Patient Instructions (Signed)
Deep core level 1 ( plus quick pelvic floor squeeze)   Deep core level 1 ( performed with pillow under knees, sliding heel out) 6 min  ____   Avoid straining pelvic floor, abdominal muscles , spine  Use log rolling technique instead of getting out of bed with your neck or the sit-up     Log rolling into and out of bed   Log rolling into and out of bed If getting out of bed on R side, Bent knees, scoot hips/ shoulder to L  Raise R arm completely overhead, rolling onto armpit  Then lower bent knees to bed to get into complete side lying position  Then drop legs off bed, and push up onto R elbow/forearm, and use L hand to push onto the bed     Proper body mechanics with getting out of a chair to decrease strain  on back &pelvic floor   Avoid holding your breath when Getting out of the chair:  Scoot to front part of chair chair Heels behind feet, feet are hip width apart, nose over toes  Inhale like you are smelling roses Exhale to stand

## 2018-08-09 NOTE — Therapy (Signed)
[place] MAIN [facility] SERVICES [address] [place], [place], [address] Phone: [phone]   Fax:  [phone]  Physical Therapy Treatment  Patient Details  Name: [name] MRN: [id number] Date of Birth: Apr 05, 1940 No data recorded  Encounter Date: 08/09/2018  PT End of Session - 08/09/18 1502    Visit Number  2    Number of Visits  10    Date for PT Re-Evaluation  10/11/18    Authorization Type  Medicare    PT Start Time  4196    PT Stop Time  1450    PT Time Calculation (min)  47 min    Activity Tolerance  Patient tolerated treatment well;No increased pain       Past Medical History:  Diagnosis Date  . AICD (automatic cardioverter/defibrillator) present    PLACED BY  DR  KLEIN 2008  . Angina    5-6 YRS AGO  . Anxiety   . Arthritis   . Atrial fibrillation ([place])   . CHF (congestive heart failure) ([place])   . Complication of anesthesia    states at times had a tough time waking up  . Diabetes mellitus   . Diverticulum of esophagus    large diverticulum in the middle third of the esophagus [facility] EGD '09)  . Dysrhythmia    A-FIB  . Fibromyalgia   . Headache(784.0)   . Heartburn   . History of stomach ulcers   . History of transient ischemic attack (TIA)    LAST ONE IN  2005  IN  [place], [place]  . HLD (hyperlipidemia)   . Hypertension   . Shortness of breath    EASING OFF NOW  . Sleep apnea    DOESN'T KNOW SETTINGS  . Stroke (cerebrum) ([place])   . TIA (transient ischemic attack)     Past Surgical History:  Procedure Laterality Date  . ANTERIOR CERVICAL DECOMP/DISCECTOMY FUSION  04/09/2011   Procedure: ANTERIOR CERVICAL DECOMPRESSION/DISCECTOMY FUSION 1 LEVEL;  Surgeon: Charlie Pitter, MD;  Location: [place] [facility];  Service: Neurosurgery;  Laterality: N/A;  Cervical five-six Anterior Cervical Decompression and Fusion  . APPENDECTOMY    . BILATERAL OOPHORECTOMY     BENIGN  . CARDIAC CATHETERIZATION     X 2    LAST ONE  2009  .  CESAREAN SECTION     X  4  . CHOLECYSTECTOMY    . INSERT / REPLACE / [place]  . JOINT REPLACEMENT     BIL  KNEES  . TONSILLECTOMY    . TUBAL LIGATION      There were no vitals filed for this visit.  Subjective Assessment - 08/09/18 1409    Subjective  Pt noticed muscles in her legs working more with the exercises. Pt is performing her kegels 4-5 x day. There has been a couple of times when she got up and she did not leak at all.    Pertinent History  4 C-sections,  2 miscarriages,  Pt has been doing her kegels for teh past 2-3 years which she learned from her sister. This has not helped but she does them. She clenches her muscles and hold 3-4 sec for 3-4 reps.  Pt is considering bladder surgery and also gastric bypass. 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         [facility] [address] PT Assessment - 08/09/18 1511      Coordination  Gross Motor Movements are Fluid and Coordinated  --   good carry over with deep core coordination.     Sit to Stand   Comments  breathholding, no carry over from last session       Bed Mobility   Bed Mobility  --   breatholding,. half crunch                  [facility] - 08/09/18 1509      Therapeutic Activites    Therapeutic Activities  --   required cues for log rolling and to not strain. breathhold      Neuro Re-ed    Neuro Re-ed Details   cued for proper technique PNF reclined, yellow band 20 reps, but did not add to HEP today to keep her HEP simple for more compliance.        Manual Therapy   Manual therapy comments  gentle fascial releases over bladder, abdomen.                   PT Long Term Goals - 08/02/18 1517      PT LONG TERM GOAL #1   Title  Pt will decrease her PFIQ-7: from 27% to < 17% in order to restore pelvic floor function in functional activities    Time  10    Period  Weeks    Status  New    Target Date  10/11/18      PT LONG TERM GOAL #2    Title  Pt will decrease her ODI score from 28% to < 18% in order to return to ADLs with less back pain    Time  10    Period  Weeks    Status  New    Target Date  10/11/18      PT LONG TERM GOAL #3   Title  Pt will demo proper deep core coordination with proper diaphragmatic excursion in order to optimize postural stability and pelvic floor function    Time  4    Period  Weeks    Status  New    Target Date  08/30/18      PT LONG TERM GOAL #4   Title  Pt will demo improved scar mobility over  the abdomen and demo improved fascial mobility for optimal deep core coordination and motility    Time  2    Period  Weeks    Status  New    Target Date  08/16/18      PT LONG TERM GOAL #5   Title  Pt will demo co-ordination of deep core with against gravity activities ( sit-to -stand, walking, bending) in order to minimize back pain and pelvic floor dysfunction    Time  8    Period  Weeks    Status  New    Target Date  09/27/18            Plan - 08/09/18 1516    Clinical Impression Statement  Pt demo'd good carry over with pelvic floor coordination and contraction with co-activation of deep core mm. Pt reported she was able to get out of chair last week a couple of times without leakage which indicates good progress. Pt progressed to deep core l evel 2 but required modifications due to limited knee flexion in bed. Provided fascial releases over bladder abdomen to faciliate more transverse abdominal mm co-activation which pt toelrated withotu complaints. Noted redness over abdominal scar where  pt has been putting anti-fungal cream. Added PNF exercise in reclined position which did not bother her shoulder. However, this exercise was put on hold until next session into her HEP to keep her HEP simple for compliance.  Pt continues to benefit from skilled PT    Personal Factors and Comorbidities  Age;Comorbidity 3+    Comorbidities  Fibromyalgia, A-fib, Diabetes, TIA, CHF, DDD     Examination-Activity Limitations  Bed Mobility;Toileting;Stand;Transfers;Continence;Stairs;Bend    Stability/Clinical Decision Making  Evolving/Moderate complexity    Rehab Potential  Good    PT Frequency  1x / week    PT Duration  --   10   PT Treatment/Interventions  Gait training;Stair training;Neuromuscular re-education;Moist Heat;Traction;Therapeutic activities;Patient/family education;Cryotherapy;Therapeutic exercise;Energy conservation;Functional mobility training;Scar mobilization;Taping;Manual techniques;Balance training    Consulted and Agree with Plan of Care  Patient       Patient will benefit from skilled therapeutic intervention in order to improve the following deficits and impairments:  Increased muscle spasms, Postural dysfunction, Decreased range of motion, Decreased endurance, Decreased balance, Difficulty walking, Impaired flexibility, Decreased safety awareness, Obesity, Impaired UE functional use, Hypomobility, Decreased strength, Decreased scar mobility, Decreased mobility, Decreased coordination, Improper body mechanics, Pain, Abnormal gait  Visit Diagnosis: 1. Chronic right shoulder pain   2. Other lack of coordination   3. Muscle weakness (generalized)   4. Chronic low back pain without sciatica, unspecified back pain laterality        Problem List Patient Active Problem List   Diagnosis Date Noted  . 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 Piriformis syndrome 06/18/2014  . Facet syndrome, lumbar 06/18/2014  . 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 DDD (degenerative disc disease), lumbar 06/18/2014  . DDD (degenerative disc disease), cervical 06/08/2014  . 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 Cervical spinal stenosis 04/09/2011    Mariane MastersYeung,Shin Yiing ,PT, DPT, E-RYT  08/09/2018, 3:22 PM  [place] [facility] [address] [place], [place], [address] Phone:  [phone]   Fax:  [phone]  Name: [name] MRN: [id number] Date of Birth: 06/13/1940

## 2018-08-12 ENCOUNTER — Encounter: Payer: Medicare Other | Admitting: Physical Therapy

## 2018-08-16 ENCOUNTER — Ambulatory Visit: Payer: Medicare Other | Admitting: Physical Therapy

## 2018-08-17 ENCOUNTER — Ambulatory Visit: Payer: Medicare Other | Admitting: Physical Therapy

## 2018-08-17 ENCOUNTER — Other Ambulatory Visit: Payer: Self-pay

## 2018-08-17 DIAGNOSIS — M25511 Pain in right shoulder: Secondary | ICD-10-CM | POA: Diagnosis not present

## 2018-08-17 DIAGNOSIS — G8929 Other chronic pain: Secondary | ICD-10-CM

## 2018-08-17 DIAGNOSIS — M6281 Muscle weakness (generalized): Secondary | ICD-10-CM

## 2018-08-17 DIAGNOSIS — R278 Other lack of coordination: Secondary | ICD-10-CM

## 2018-08-17 DIAGNOSIS — M545 Low back pain, unspecified: Secondary | ICD-10-CM

## 2018-08-17 NOTE — Patient Instructions (Addendum)
New exercise  Seated in a chair, without your back against the chair, more halfway on the chair   "Drawing a sword" " pulling a lawnmover"   Band under both feet,   switch opposite ends of band and make sure to press thighs against band  R hand holds end of the band that wraps the outside of B thigh Keep thigh aligned with toes and not let them move inward,  Keep elbow by your ribs  10 x 2 reps     __  Log rolling: Make sure headlights of shoulders, hips, and knees point forward not to the ceiling before you drop your feet off , use the top hand to press and lift up the body

## 2018-08-18 NOTE — Therapy (Signed)
[place] MAIN [facility] SERVICES [address] [place], [place], [address] Phone: [phone]   Fax:  [phone]  Physical Therapy Treatment  Patient Details  Name: [name] MRN: [id number] Date of Birth: [birth year] No data recorded  Encounter Date: 08/17/2018  PT End of Session - 08/17/18 1120    Visit Number  3    Number of Visits  10    Date for PT Re-Evaluation  10/11/18    Authorization Type  Medicare    PT Start Time  1104    Activity Tolerance  Patient tolerated treatment well;No increased pain       Past Medical History:  Diagnosis Date  . AICD (automatic cardioverter/defibrillator) present    PLACED BY  DR  KLEIN 2008  . Angina    5-6 YRS AGO  . Anxiety   . Arthritis   . Atrial fibrillation ([place])   . CHF (congestive heart failure) ([facility])   . Complication of anesthesia    states at times had a tough time waking up  . Diabetes mellitus   . Diverticulum of esophagus    large diverticulum in the middle third of the esophagus [facility] EGD '09)  . Dysrhythmia    A-FIB  . Fibromyalgia   . Headache(784.0)   . Heartburn   . History of stomach ulcers   . History of transient ischemic attack (TIA)    LAST ONE IN  2005  IN  [place], [place]  . HLD (hyperlipidemia)   . Hypertension   . Shortness of breath    EASING OFF NOW  . Sleep apnea    DOESN'T KNOW SETTINGS  . Stroke (cerebrum) ([place])   . TIA (transient ischemic attack)     Past Surgical History:  Procedure Laterality Date  . ANTERIOR CERVICAL DECOMP/DISCECTOMY FUSION  04/09/2011   Procedure: ANTERIOR CERVICAL DECOMPRESSION/DISCECTOMY FUSION 1 LEVEL;  Surgeon: Charlie Pitter, MD;  Location: [place] [facility];  Service: Neurosurgery;  Laterality: N/A;  Cervical five-six Anterior Cervical Decompression and Fusion  . APPENDECTOMY    . BILATERAL OOPHORECTOMY     BENIGN  . CARDIAC CATHETERIZATION     X 2    LAST ONE  2009  . CESAREAN SECTION     X  4  . CHOLECYSTECTOMY    . INSERT  / REPLACE / [place]  . JOINT REPLACEMENT     BIL  KNEES  . TONSILLECTOMY    . TUBAL LIGATION      There were no vitals filed for this visit.  Subjective Assessment - 08/18/18 0929    Subjective  Pt practiced her exercises and discovered muscles she has not used before.    Pertinent History  4 C-sections,  2 miscarriages,  Pt has been doing her kegels for teh past 2-3 years which she learned from her sister. This has not helped but she does them. She clenches her muscles and hold 3-4 sec for 3-4 reps.  Pt is considering bladder surgery and also gastric bypass. 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         [facility] PT Assessment - 08/18/18 0923      Coordination   Gross Motor Movements are Fluid and Coordinated  --   moderate cues for new exercises for strengtehning global mm      6 Minute Walk- Baseline   02 Sat (%RA)  --   pre-^MWT: 90  6 Minute walk- Post Test   6 Minute Walk Post Test  --   post: 90     6 minute walk test results    Aerobic Endurance Distance Walked  200   feet at 2 min 9 sec   Endurance additional comments  reported fatigue 2 min 9 sec and needed to rest at 200' .  HR 90 pre and post. (pacemaker)    no SOB noted                  [facility] Adult PT Treatment/Exercise - 08/18/18 [id number]      Ambulation/Gait   Gait Comments  longer stride length,       Neuro Re-ed    Neuro Re-ed Details   required review with cues for log rolling, new PNF exercise, sit to stand                   PT Long Term Goals - 08/18/18 0927      PT LONG TERM GOAL #1   Title  Pt will decrease her PFIQ-7: from 27% to < 17% in order to restore pelvic floor function in functional activities    Time  10    Period  Weeks    Status  New      PT LONG TERM GOAL #2   Title  Pt will decrease her ODI score from 28% to < 18% in order to return to ADLs with less back pain    Time  10    Period  Weeks    Status  New      PT  LONG TERM GOAL #3   Title  Pt will demo proper deep core coordination with proper diaphragmatic excursion in order to optimize postural stability and pelvic floor function    Time  4    Period  Weeks    Status  New      PT LONG TERM GOAL #4   Title  Pt will demo improved scar mobility over  the abdomen and demo improved fascial mobility for optimal deep core coordination and motility    Time  2    Period  Weeks    Status  New      PT LONG TERM GOAL #5   Title  Pt will demo co-ordination of deep core with against gravity activities ( sit-to -stand, walking, bending) in order to minimize back pain and pelvic floor dysfunction    Time  8    Period  Weeks    Status  New      Additional Long Term Goals   Additional Long Term Goals  Yes      PT LONG TERM GOAL #6   Title  Pt will demo increased endurance with walking distance from 200 ft ( 2:09) to >800 ft ( 6 MWT) without need to rest and no drop in O2 Sat < 90 in order timprove wellness/ aerobic health to get tot he bathroom at greater speed before leaking    Time  6    Period  Weeks    Status  New    Target Date  09/29/18            Plan - 08/18/18 0929    Clinical Impression Statement  Pt is progressing well with global strengthening exercises for stronger postural stability.  Reviewed deep core coordination/ strengthening which pt performed properly with minor cues. Provided motivational interviewing and strategies to build compliance with more aerobic  routine with short duration walking. Pt was not able to complete 6MWT, achieving 200 ft in 2 min but did not show SOB nor drop in HR. Pt has a pacemaker. Pt voiced motivation and compliance to HEP. Anticipate pt will improve urge incontinence with improved endurance/ strength for ambulation in addition to pelvic /deep core strengthening.  Pt continues to benefit from skilled PT.     Personal Factors and Comorbidities  Age;Comorbidity 3+    Comorbidities  Fibromyalgia, A-fib,  Diabetes, TIA, CHF, DDD    Examination-Activity Limitations  Bed Mobility;Toileting;Stand;Transfers;Continence;Stairs;Bend    Stability/Clinical Decision Making  Evolving/Moderate complexity    Rehab Potential  Good    PT Frequency  1x / week    PT Duration  --   10   PT Treatment/Interventions  Gait training;Stair training;Neuromuscular re-education;Moist Heat;Traction;Therapeutic activities;Patient/family education;Cryotherapy;Therapeutic exercise;Energy conservation;Functional mobility training;Scar mobilization;Taping;Manual techniques;Balance training    Consulted and Agree with Plan of Care  Patient       Patient will benefit from skilled therapeutic intervention in order to improve the following deficits and impairments:  Increased muscle spasms, Postural dysfunction, Decreased range of motion, Decreased endurance, Decreased balance, Difficulty walking, Impaired flexibility, Decreased safety awareness, Obesity, Impaired UE functional use, Hypomobility, Decreased strength, Decreased scar mobility, Decreased mobility, Decreased coordination, Improper body mechanics, Pain, Abnormal gait  Visit Diagnosis: 1. Chronic right shoulder pain   2. Other lack of coordination   3. Muscle weakness (generalized)   4. Chronic low back pain without sciatica, unspecified back pain laterality        Problem List Patient Active Problem List   Diagnosis Date Noted  . 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 Piriformis syndrome 06/18/2014  . Facet syndrome, lumbar 06/18/2014  . 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 DDD (degenerative disc disease), lumbar 06/18/2014  . DDD (degenerative disc disease), cervical 06/08/2014  . 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 Cervical spinal stenosis 04/09/2011    Mariane MastersYeung,Shin Yiing ,PT, DPT, E-RYT  08/18/2018, 9:31 AM  [place] [facility] [address] [place], [place], [address] Phone: [phone]   Fax:  [phone]  Name: [name] MRN: [id number] Date of Birth: 04/08/1940

## 2018-08-19 ENCOUNTER — Encounter: Payer: Medicare Other | Admitting: Physical Therapy

## 2018-08-23 ENCOUNTER — Ambulatory Visit: Payer: Medicare Other | Admitting: Physical Therapy

## 2018-08-23 ENCOUNTER — Other Ambulatory Visit: Payer: Self-pay

## 2018-08-23 DIAGNOSIS — R278 Other lack of coordination: Secondary | ICD-10-CM

## 2018-08-23 DIAGNOSIS — G8929 Other chronic pain: Secondary | ICD-10-CM

## 2018-08-23 DIAGNOSIS — M6281 Muscle weakness (generalized): Secondary | ICD-10-CM

## 2018-08-23 DIAGNOSIS — M545 Low back pain, unspecified: Secondary | ICD-10-CM

## 2018-08-23 DIAGNOSIS — M25511 Pain in right shoulder: Secondary | ICD-10-CM | POA: Diagnosis not present

## 2018-08-23 NOTE — Patient Instructions (Addendum)
10 quick pelvic floor squeezes:  1)  When sit down for coffee:  ( aiming to wake up no later than 10am)  Drink room temp water first before coffee  2) after breakfast    3) 2pm with computer game    4) after dinner ( aiming to have dinner 7:30)    __   Total 40 reps per day   _________________________   Seated marching with hands supported on chair to support back   Breathe  2 min x 3 x day     _____  Deep core level 2 (bedtime and also in the afternoon)   To ensure you get in 2x day because morning time has not worked due to needing to go to the bathroom

## 2018-08-23 NOTE — Therapy (Signed)
[place] [facility] MAIN [facility] [address] [place], [place], [address] Phone: [phone]   Fax:  [phone]  Physical Therapy Treatment  Patient Details  Name: Heather LoftySandra J Reede MRN: [id number] Date of Birth: 07/13/1940 No data recorded  Encounter Date: 08/23/2018  PT End of Session - 08/23/18 1455    Visit Number  4    Number of Visits  10    Date for PT Re-Evaluation  10/11/18    Authorization Type  Medicare    PT Start Time  1400    PT Stop Time  1455    PT Time Calculation (min)  55 min    Activity Tolerance  Patient tolerated treatment well;No increased pain       Past Medical History:  Diagnosis Date  . AICD (automatic cardioverter/defibrillator) present    PLACED BY  DR  Graciela HusbandsKLEIN 2008  . Angina    5-6 YRS AGO  . Anxiety   . Arthritis   . Atrial fibrillation (HCC)   . CHF (congestive heart failure) (HCC)   . Complication of anesthesia    states at times had a tough time waking up  . Diabetes mellitus   . Diverticulum of esophagus    large diverticulum in the middle third of the esophagus [facility] EGD '09)  . Dysrhythmia    A-FIB  . Fibromyalgia   . Headache(784.0)   . Heartburn   . History of stomach ulcers   . History of transient ischemic attack (TIA)    [place]  2005  [place]  . HLD (hyperlipidemia)   . Hypertension   . Shortness of breath    EASING OFF NOW  . Sleep apnea    DOESN'T KNOW SETTINGS  . Stroke (cerebrum) (HCC)   . TIA (transient ischemic attack)     Past Surgical History:  Procedure Laterality Date  . ANTERIOR CERVICAL DECOMP/DISCECTOMY FUSION  04/09/2011   Procedure: ANTERIOR CERVICAL DECOMPRESSION/DISCECTOMY FUSION 1 LEVEL;  Surgeon: Temple PaciniHenry A Pool, MD;  Location: [facility];  Service: Neurosurgery;  Laterality: N/A;  Cervical five-six Anterior Cervical Decompression and Fusion  . APPENDECTOMY    . BILATERAL OOPHORECTOMY     BENIGN  . CARDIAC CATHETERIZATION     X 2    LAST ONE  2009  .  CESAREAN SECTION     X  4  . CHOLECYSTECTOMY    . INSERT / REPLACE / REMOVE PACEMAKER     ST  JUDE  DEVICE  . JOINT REPLACEMENT     BIL  KNEES  . TONSILLECTOMY    . TUBAL LIGATION      There were no vitals filed for this visit.  Subjective Assessment - 08/23/18 1429    Subjective  Pt reports getting to the bathroom and leaking is still a problem. Pt has no consistency in her schedule. Pt does her kegels when she thinks about it.    Pertinent History  4 C-sections,  2 miscarriages,  Pt has been doing her kegels for the past 2-3 years which she learned from her sister. This has not helped but she does them. She clenches her muscles and hold 3-4 sec for 3-4 reps.  Pt is considering bladder surgery and also gastric bypass. 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         [facility] - 08/23/18 1432      Observation/Other Assessments   Observations  reviewed band exercise: pt  demo'd minimal trunk rotation.       Ambulation/Gait   Gait velocity  0.6 m/s with rollator                 Pelvic Floor Special Questions - 08/23/18 1447    External Perineal Exam  seated marching with hands supported chair seat        [facility] Adult PT Treatment/Exercise - 08/23/18 1432      Therapeutic Activites    Therapeutic Activities  --   strategized with pt on building consistency with kegels    Other Therapeutic Activities  bladder health education with room temp water vs iced.  Seated marches for 2' ,                     PT Long Term Goals - 08/23/18 1455      PT LONG TERM GOAL #1   Title  Pt will decrease her PFIQ-7: from 27% to < 17% in order to restore pelvic floor function in functional activities    Time  10    Period  Weeks    Status  On-going      PT LONG TERM GOAL #2   Title  Pt will decrease her ODI score from 28% to < 18% in order to return to ADLs with less back pain    Time  10    Period  Weeks    Status  On-going      PT LONG TERM GOAL  #3   Title  Pt will demo proper deep core coordination with proper diaphragmatic excursion in order to optimize postural stability and pelvic floor function    Time  4    Period  Weeks    Status  Achieved      PT LONG TERM GOAL #4   Title  Pt will demo improved scar mobility over  the abdomen and demo improved fascial mobility for optimal deep core coordination and motility    Time  2    Period  Weeks    Status  Achieved      PT LONG TERM GOAL #5   Title  Pt will demo co-ordination of deep core with against gravity activities ( sit-to -stand, walking, bending) in order to minimize back pain and pelvic floor dysfunction    Time  8    Period  Weeks    Status  On-going      Additional Long Term Goals   Additional Long Term Goals  Yes      PT LONG TERM GOAL #6   Title  Pt will demo increased endurance with walking distance from 200 ft ( 2:09) to >800 ft ( 6 MWT) without need to rest and no drop in O2 Sat < 90 in order timprove wellness/ aerobic health to get tot he bathroom at greater speed before leaking    Time  6    Period  Weeks    Status  On-going      PT LONG TERM GOAL #7   Title  Pt will increase gait speed with rollatore with 0.6 m/s to  > 0.9 m/s in order to ambulate safely to restrooms    Time  4    Period  Weeks    Status  New    Target Date  10/18/18            Plan - 08/23/18 1455    Clinical Impression Statement  Pt showed stronger sit -to stand  with proper deep core technique and reports less leakage with sit to stand. Today, pt required modifications to red band for better hand hold ( added handles) and required cues for more trunk rotation with PNF exercise. Provided seated aerobic exercise which pt performed without difficulty ( 55min) and needed hands on chair for support to minimize back pain. Anticipate by improving pt's aerobic endurance and strength, pt will also have the functional mobiity to minimize leakage by the time she gets to the bathroom. Pt has  multiple co-morbidities and lacks structure in her life. During [place] pandemic, pt is not able to go to the [facility]. Provided motivational interviewing to build compliance with kegel exercises and deep core level 2 exercise. Collaborated strategies which pt argreed to perform HEP more systematically and consistently. Pt continues to benefit from skilled PT.    Personal Factors and Comorbidities  Age;Comorbidity 3+    Comorbidities  Fibromyalgia, A-fib, Diabetes, TIA, CHF, DDD    Examination-Activity Limitations  Bed Mobility;Toileting;Stand;Transfers;Continence;Stairs;Bend    Stability/Clinical Decision Making  Evolving/Moderate complexity    Rehab Potential  Good    PT Frequency  1x / week    PT Duration  --   10   PT Treatment/Interventions  Gait training;Stair training;Neuromuscular re-education;Moist Heat;Traction;Therapeutic activities;Patient/family education;Cryotherapy;Therapeutic exercise;Energy conservation;Functional mobility training;Scar mobilization;Taping;Manual techniques;Balance training    Consulted and Agree with Plan of Care  Patient       Patient will benefit from skilled therapeutic intervention in order to improve the following deficits and impairments:  Increased muscle spasms, Postural dysfunction, Decreased range of motion, Decreased endurance, Decreased balance, Difficulty walking, Impaired flexibility, Decreased safety awareness, Obesity, Impaired UE functional use, Hypomobility, Decreased strength, Decreased scar mobility, Decreased mobility, Decreased coordination, Improper body mechanics, Pain, Abnormal gait  Visit Diagnosis: 1. Chronic right shoulder pain   2. Other lack of coordination   3. Muscle weakness (generalized)   4. Chronic low back pain without sciatica, unspecified back pain laterality        Problem List Patient Active Problem List   Diagnosis Date Noted  . 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 Piriformis syndrome 06/18/2014  . Facet  syndrome, lumbar 06/18/2014  . 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 DDD (degenerative disc disease), lumbar 06/18/2014  . DDD (degenerative disc disease), cervical 06/08/2014  . 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 Cervical spinal stenosis 04/09/2011    Jerl Mina ,PT, DPT, E-RYT  08/23/2018, 3:09 PM  [place] MAIN [facility] SERVICES [address] [place], [place], [address] Phone: [phone]   Fax:  [phone]  Name: AUDRA KAGEL MRN: [id number] Date of Birth: Jun 17, 1940

## 2018-08-30 ENCOUNTER — Ambulatory Visit: Payer: Medicare Other | Admitting: Physical Therapy

## 2018-09-06 ENCOUNTER — Telehealth: Payer: Self-pay | Admitting: Physical Therapy

## 2018-09-06 ENCOUNTER — Ambulatory Visit: Payer: Medicare Other | Attending: Student | Admitting: Physical Therapy

## 2018-09-06 DIAGNOSIS — M6281 Muscle weakness (generalized): Secondary | ICD-10-CM | POA: Insufficient documentation

## 2018-09-06 DIAGNOSIS — R278 Other lack of coordination: Secondary | ICD-10-CM | POA: Insufficient documentation

## 2018-09-06 DIAGNOSIS — M25511 Pain in right shoulder: Secondary | ICD-10-CM | POA: Insufficient documentation

## 2018-09-06 DIAGNOSIS — M533 Sacrococcygeal disorders, not elsewhere classified: Secondary | ICD-10-CM | POA: Insufficient documentation

## 2018-09-06 DIAGNOSIS — M545 Low back pain: Secondary | ICD-10-CM | POA: Insufficient documentation

## 2018-09-06 DIAGNOSIS — G8929 Other chronic pain: Secondary | ICD-10-CM | POA: Insufficient documentation

## 2018-09-06 NOTE — Telephone Encounter (Signed)
Pt was called to about missing her PT appt. Pt reported she has had vertigo for 9th day and getting treatments at her doctors. DPT advised about discontinusing any seated exercises in her HEP until her vertigo clears up. In the menatime, DPT advised compliance with Deep core level 1-2 which are performing inbed where she is safe and does not require any head turns which can minimize onset to vertigo.  Pt voiced understanding.

## 2018-09-13 ENCOUNTER — Other Ambulatory Visit: Payer: Self-pay

## 2018-09-13 ENCOUNTER — Ambulatory Visit: Payer: Medicare Other | Admitting: Physical Therapy

## 2018-09-13 DIAGNOSIS — M545 Low back pain, unspecified: Secondary | ICD-10-CM

## 2018-09-13 DIAGNOSIS — M533 Sacrococcygeal disorders, not elsewhere classified: Secondary | ICD-10-CM | POA: Diagnosis not present

## 2018-09-13 DIAGNOSIS — G8929 Other chronic pain: Secondary | ICD-10-CM | POA: Diagnosis present

## 2018-09-13 DIAGNOSIS — R278 Other lack of coordination: Secondary | ICD-10-CM | POA: Diagnosis present

## 2018-09-13 DIAGNOSIS — M6281 Muscle weakness (generalized): Secondary | ICD-10-CM

## 2018-09-13 DIAGNOSIS — M25511 Pain in right shoulder: Secondary | ICD-10-CM | POA: Diagnosis not present

## 2018-09-13 NOTE — Therapy (Signed)
[place] [facility] [address] [place], [place], [address] Phone: [phone]   Fax:  [phone]  Physical Therapy Treatment  Patient Details  Name: [name] MRN: [id number] Date of Birth: 06/18/1940 No data recorded  Encounter Date: 09/13/2018  PT End of Session - 09/13/18 1457    Visit Number  5    Number of Visits  10    Date for PT Re-Evaluation  10/11/18    Authorization Type  Medicare    PT Start Time  1400    PT Stop Time  1457    PT Time Calculation (min)  57 min    Activity Tolerance  Patient tolerated treatment well;No increased pain       Past Medical History:  Diagnosis Date  . AICD (automatic cardioverter/defibrillator) present    PLACED BY  DR  Graciela HusbandsKLEIN 2008  . Angina    5-6 YRS AGO  . Anxiety   . Arthritis   . Atrial fibrillation (HCC)   . CHF (congestive heart failure) (HCC)   . Complication of anesthesia    states at times had a tough time waking up  . Diabetes mellitus   . Diverticulum of esophagus    large diverticulum in the middle third of the esophagus [facility] '09)  . Dysrhythmia    A-FIB  . Fibromyalgia   . Headache(784.0)   . Heartburn   . History of stomach ulcers   . History of transient ischemic attack (TIA)    LAST ONE IN  2005  [place]  . HLD (hyperlipidemia)   . Hypertension   . Shortness of breath    EASING OFF NOW  . Sleep apnea    DOESN'T KNOW SETTINGS  . Stroke (cerebrum) (HCC)   . TIA (transient ischemic attack)     Past Surgical History:  Procedure Laterality Date  . ANTERIOR CERVICAL DECOMP/DISCECTOMY FUSION  04/09/2011   Procedure: ANTERIOR CERVICAL DECOMPRESSION/DISCECTOMY FUSION 1 LEVEL;  Surgeon: Temple PaciniHenry A Pool, MD;  Location[facility];  Service: Neurosurgery;  Laterality: N/A;  Cervical five-six Anterior Cervical Decompression and Fusion  . APPENDECTOMY    . BILATERAL OOPHORECTOMY     BENIGN  . CARDIAC CATHETERIZATION     X 2    LAST ONE  2009  .  CESAREAN SECTION     X  4  . CHOLECYSTECTOMY    . INSERT / REPLACE / REMOVE PACEMAKER     ST  JUDE  DEVICE  . JOINT REPLACEMENT     BIL  KNEES  . TONSILLECTOMY    . TUBAL LIGATION      There were no vitals filed for this visit.  Subjective Assessment - 09/13/18 1405    Subjective  Pt has kept up with her exercises for 4 days after she made a schedule. Pt had vertigo and it was bad. Pt received treatment and it got better. Pt was not able to perform her HEP when she had vertigo. Pt has had more days with less LBP. Pt feels 50% improved with her leakage. Problem with leakage occurs with sneezing    Pertinent History  4 C-sections,  2 miscarriages,  Pt has been doing her kegels for the past 2-3 years which she learned from her sister. This has not helped but she does them. She clenches her muscles and hold 3-4 sec for 3-4 reps.  Pt is considering bladder surgery and also gastric bypass. Ovaries were removed  due to golf-ball size cysts.         [facility] PT Assessment - 09/13/18 1406      Observation/Other Assessments   Observations  no cues for sit to stand.  poor propioception of feet in hip ext exercise.        Palpation   Palpation comment  increased fascial restrictions L abdominal scar medial aspect . noted firmer mm tone       Bed Mobility   Bed Mobility  --   repeated cues for log rolling                  [facility] Adult PT Treatment/Exercise - 09/13/18 1553      Neuro Re-ed    Neuro Re-ed Details   excessive tactile cues for feet propioception with hip ext bilateral BUE on rollator parked against wall, 20 reps, discontinued opposite shoulder flex due to diffiulty with balance and recent vertigo. Pt did not have dizziness during session. Cued for decreased lumbar lordosis in seated band exercise and  hip ext exercise to minimize back pain . repeated trials for log rolling technique to minimzie straining pelvic floor         Manual Therapy   Manual therapy comments   abdominal scar restrictions / fascial release with MWM                   PT Long Term Goals - 08/23/18 1455      PT LONG TERM GOAL #1   Title  Pt will decrease her PFIQ-7: from 27% to < 17% in order to restore pelvic floor function in functional activities    Time  10    Period  Weeks    Status  On-going      PT LONG TERM GOAL #2   Title  Pt will decrease her ODI score from 28% to < 18% in order to return to ADLs with less back pain    Time  10    Period  Weeks    Status  On-going      PT LONG TERM GOAL #3   Title  Pt will demo proper deep core coordination with proper diaphragmatic excursion in order to optimize postural stability and pelvic floor function    Time  4    Period  Weeks    Status  Achieved      PT LONG TERM GOAL #4   Title  Pt will demo improved scar mobility over  the abdomen and demo improved fascial mobility for optimal deep core coordination and motility    Time  2    Period  Weeks    Status  Achieved      PT LONG TERM GOAL #5   Title  Pt will demo co-ordination of deep core with against gravity activities ( sit-to -stand, walking, bending) in order to minimize back pain and pelvic floor dysfunction    Time  8    Period  Weeks    Status  On-going      Additional Long Term Goals   Additional Long Term Goals  Yes      PT LONG TERM GOAL #6   Title  Pt will demo increased endurance with walking distance from 200 ft ( 2:09) to >800 ft ( 6 MWT) without need to rest and no drop in O2 Sat < 90 in order timprove wellness/ aerobic health to get tot he bathroom at greater speed before leaking    Time  6    Period  Weeks    Status  On-going      PT LONG TERM GOAL #7   Title  Pt will increase gait speed with rollatore with 0.6 m/s to  > 0.9 m/s in order to ambulate safely to restrooms    Time  4    Period  Weeks    Status  New    Target Date  10/18/18            Plan - 09/13/18 1600    Clinical Impression Statement  Pt is progressing well  with strengthening and decreased abdominal scar restrictions. Pt no longer required cues for proper sit to stand. Added excessive propioception training for feet alignment for balance in hip ext exercises BUE support on rollator.  Pt had  vertigo last week which has resolved but modified strengthening exercises to minimize head turns and downgraded standing balance exercises. Added training to coordinated pelvic floor contration with coughing, sneezing.  Pt continues to benefit from skilled PT.    Personal Factors and Comorbidities  Age;Comorbidity 3+    Comorbidities  Fibromyalgia, A-fib, Diabetes, TIA, CHF, DDD    Examination-Activity Limitations  Bed Mobility;Toileting;Stand;Transfers;Continence;Stairs;Bend    Stability/Clinical Decision Making  Evolving/Moderate complexity    Rehab Potential  Good    PT Frequency  1x / week    PT Duration  --   10   PT Treatment/Interventions  Gait training;Stair training;Neuromuscular re-education;Moist Heat;Traction;Therapeutic activities;Patient/family education;Cryotherapy;Therapeutic exercise;Energy conservation;Functional mobility training;Scar mobilization;Taping;Manual techniques;Balance training    Consulted and Agree with Plan of Care  Patient       Patient will benefit from skilled therapeutic intervention in order to improve the following deficits and impairments:  Increased muscle spasms, Postural dysfunction, Decreased range of motion, Decreased endurance, Decreased balance, Difficulty walking, Impaired flexibility, Decreased safety awareness, Obesity, Impaired UE functional use, Hypomobility, Decreased strength, Decreased scar mobility, Decreased mobility, Decreased coordination, Improper body mechanics, Pain, Abnormal gait  Visit Diagnosis: 1. Chronic right shoulder pain   2. Other lack of coordination   3. Muscle weakness (generalized)   4. Chronic low back pain without sciatica, unspecified back pain laterality        Problem  List Patient Active Problem List   Diagnosis Date Noted  . 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 Piriformis syndrome 06/18/2014  . Facet syndrome, lumbar 06/18/2014  . 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 DDD (degenerative disc disease), lumbar 06/18/2014  . DDD (degenerative disc disease), cervical 06/08/2014  . 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 Cervical spinal stenosis 04/09/2011    Jerl Mina ,PT, DPT, E-RYT  09/13/2018, 4:07 PM  [place] MAIN [facility] SERVICES [address] [place], [place], [address] Phone: [phone]   Fax:  [phone]  Name: [name] MRN: [id number] Date of Birth: 1940/03/22

## 2018-09-13 NOTE — Patient Instructions (Addendum)
Progress to seated "W" with blue bands in rollator Watch out for not overarching your back  20 reps x 2-3 x day    ___   Practice log rolling   ___  Standing at rollator, KEEP HANDS on HANDLES, rollator parked in front of wall  Tap ballmounds of foot back without overarching your back  Place foot back under hips, wider than you think, look down to make sure  For better balance  20 reps   ____  Coordinating quick pelvic floor squeeze with coughing, sneezing, laughing

## 2018-09-20 ENCOUNTER — Ambulatory Visit: Payer: Medicare Other | Admitting: Physical Therapy

## 2018-09-27 ENCOUNTER — Encounter: Payer: Self-pay | Admitting: Physical Therapy

## 2018-09-27 ENCOUNTER — Ambulatory Visit: Payer: Medicare Other | Admitting: Physical Therapy

## 2018-09-27 DIAGNOSIS — M545 Low back pain, unspecified: Secondary | ICD-10-CM

## 2018-09-27 DIAGNOSIS — M25511 Pain in right shoulder: Secondary | ICD-10-CM

## 2018-09-27 DIAGNOSIS — R278 Other lack of coordination: Secondary | ICD-10-CM

## 2018-09-27 DIAGNOSIS — G8929 Other chronic pain: Secondary | ICD-10-CM

## 2018-09-27 DIAGNOSIS — M6281 Muscle weakness (generalized): Secondary | ICD-10-CM

## 2018-09-27 NOTE — Therapy (Signed)
[place] MAIN [facility] SERVICES [address] [place], [place], [address] Phone: [phone]   Fax:  [phone]  Patient Details  Name: [name] MRN: [id number] Date of Birth: 1940/03/08 Referring Provider:  Tammi Klippel, PA-C  Encounter Date: 09/27/2018   Discharge Summary 08/02/18- 09/13/18 5 visits   Pt has self d/c at this time. Pt has achieved 3/7 goals with the remaining goals unable to reassess due to pt self-d/c. Over the past 5 visits, pt demo'd  Decreased abdominal scar restrictions, improved deep core coordination/ strength, corrections to kegel exercises, increased hip mobility and longer stride length in gait with rollator use. Pt showed stronger sit -to stand with proper deep core technique and reports less leakage with sit to stand.    Pt required attention to motivational interviewing to remain compliant to aerobic activity and HEP.     PT Long Term Goals - 08/23/18 1455      PT LONG TERM GOAL #1   Title  Pt will decrease her PFIQ-7: from 27% to < 17% in order to restore pelvic floor function in functional activities    Time  10    Period  Weeks    Status  Unable to reassess      PT LONG TERM GOAL #2   Title  Pt will decrease her ODI score from 28% to < 18% in order to return to ADLs with less back pain    Time  10    Period  Weeks    Status  Unable to reassess     PT LONG TERM GOAL #3   Title  Pt will demo proper deep core coordination with proper diaphragmatic excursion in order to optimize postural stability and pelvic floor function    Time  4    Period  Weeks    Status  Achieved      PT LONG TERM GOAL #4   Title  Pt will demo improved scar mobility over  the abdomen and demo improved fascial mobility for optimal deep core coordination and motility    Time  2    Period  Weeks    Status  Achieved      PT LONG TERM GOAL #5   Title  Pt will demo co-ordination of deep core with against gravity activities (  sit-to -stand, walking, bending) in order to minimize back pain and pelvic floor dysfunction    Time  8      Weeks    Status  Achieved      Additional Long Term Goals   Additional Long Term Goals  Yes      PT LONG TERM GOAL #6   Title  Pt will demo increased endurance with walking distance from 200 ft ( 2:09) to >800 ft ( 6 MWT) without need to rest and no drop in O2 Sat < 90 in order timprove wellness/ aerobic health to get tot he bathroom at greater speed before leaking    Time  6    Period  Weeks    Status Unable to reassess     PT LONG TERM GOAL #7   Title  Pt will increase gait speed with rollatore with 0.6 m/s to  > 0.9 m/s in order to ambulate safely to restrooms    Time  4    Period  Weeks    Status Unable to reassess   Target Date  10/18/18  Mariane MastersYeung,Shin Yiing ,PT, DPT, E-RYT  09/27/2018, 2:50 PM  [place] [facility] MAIN [facility] [address] [place], [place], [address] Phone: [phone]   Fax:  [phone]

## 2018-10-04 ENCOUNTER — Encounter: Payer: Medicare Other | Admitting: Physical Therapy

## 2018-11-11 ENCOUNTER — Telehealth: Payer: Self-pay | Admitting: Urology

## 2018-11-11 DIAGNOSIS — N3946 Mixed incontinence: Secondary | ICD-10-CM

## 2018-11-11 NOTE — Telephone Encounter (Signed)
Pt called and states that she had an appt with [facility] in [place] in March 2020 and they cancelled appt (Due to Pand[place]) She called them back to r/s and they stated that they don't have her in their system.

## 2018-11-11 NOTE — Telephone Encounter (Signed)
Yes please as there is not one in Epic   thanks

## 2018-11-11 NOTE — Telephone Encounter (Signed)
Order placed for UDS referral to [facility]

## 2018-12-20 ENCOUNTER — Other Ambulatory Visit: Payer: Self-pay | Admitting: Urology

## 2019-01-10 ENCOUNTER — Ambulatory Visit (INDEPENDENT_AMBULATORY_CARE_PROVIDER_SITE_OTHER): Payer: Medicare Other | Admitting: Urology

## 2019-01-10 ENCOUNTER — Encounter: Payer: Self-pay | Admitting: Urology

## 2019-01-10 ENCOUNTER — Other Ambulatory Visit: Payer: Self-pay

## 2019-01-10 VITALS — BP 136/78 | HR 84 | Ht 63.0 in | Wt 268.0 lb

## 2019-01-10 DIAGNOSIS — N3946 Mixed incontinence: Secondary | ICD-10-CM

## 2019-01-10 NOTE — Progress Notes (Signed)
01/10/2019 10:37 AM   [name] 1940/07/10 [id number]  Referring provider: Gracelyn Nurse, MD [address] [place],  [place] [address]  Chief Complaint  Patient presents with  . Follow-up    HPI: [name] 2017: Was given Vesicare.  Is known to have CPAP and nocturia.  Had a history of microscopic hematuria.  Saw Dr. Lonna Cobb in January 2020.  She may have had mixed results or little benefit from [place] and has described a failed Botox treatment perhaps at [facility].  Was started on Myrbetriq  Today She leaks with coughing sneezing sometimes bending and lifting.  She has urge incontinence.  Both are significant.  She can go from a sitting to standing position and urine runs on her leg.  She has high-volume bedwetting.  She soaks 5 pads a day.  She voids every 1 or 2 hours during the day and has no nocturia  She has failed Myrbetriq Toviaz and Botox.  She has had 2 TIAs.   Patient has high-volume mixed incontinence and bedwetting.  The role of urodynamics discussed.  I think would be a good idea to perform cystoscopy.  Botox was in 2016.  I would not be surprised based on pelvic examination the primary problem is an overactive bladder.   The Frequency stable.  Incontinence stable. On urodynamics the patient emptied efficiently.  Maximum bladder capacity was 100 mL.  Bladder was unstable reaching a pressure of 26 cm of water.  She had to void off the contraction.  It was difficult assess for stress incontinence because of low capacity bladder.  At 90 mL she had no stress incontinence with a Valsalva pressure 140 cm water.  During voluntary voiding she voided 53 mils a max flow 6 mils per second.  Maximum voiding pressure 11 cm of water.  She emptied efficiently.  EMG activity quiet during bladder voiding.  Bladder neck descent at 1 or 2 cm.  The details of the urodynamics are signed and dictated  90% of problem is overactive bladder.  By history is mild stress incontinence.   Percutaneous tibial nerve stimulation and InterStim discussed  She has a pacemaker which is a contraindication to percutaneous tibial nerve stimulation.  She would like to proceed with a test stimulation for InterStim and with her high volume flat and I think this is a very good choice.  She does take Coumadin.  Test stimulation will be done on Coumadin.  PMH: Past Medical History:  Diagnosis Date  . AICD (automatic cardioverter/defibrillator) present    PLACED BY  DR  Graciela Husbands 2008  . Angina    5-6 YRS AGO  . Anxiety   . Arthritis   . Atrial fibrillation (HCC)   . CHF (congestive heart failure) (HCC)   . Complication of anesthesia    states at times had a tough time waking up  . Diabetes mellitus   . Diverticulum of esophagus    large diverticulum in the middle third of the esophagus [facility] EGD '09)  . Dysrhythmia    A-FIB  . Fibromyalgia   . Headache(784.0)   . Heartburn   . History of stomach ulcers   . History of transient ischemic attack (TIA)    LAST ONE IN  2005  IN  [place], [place]  . HLD (hyperlipidemia)   . Hypertension   . Shortness of breath    EASING OFF NOW  . Sleep apnea    DOESN'T KNOW SETTINGS  . Stroke (cerebrum) (HCC)   .  TIA (transient ischemic attack)     Surgical History: Past Surgical History:  Procedure Laterality Date  . ANTERIOR CERVICAL DECOMP/DISCECTOMY FUSION  04/09/2011   Procedure: ANTERIOR CERVICAL DECOMPRESSION/DISCECTOMY FUSION 1 LEVEL;  Surgeon: Temple PaciniHenry A Pool, MD;  Location: [facility];  Service: Neurosurgery;  Laterality: N/A;  Cervical five-six Anterior Cervical Decompression and Fusion  . APPENDECTOMY    . BILATERAL OOPHORECTOMY     BENIGN  . CARDIAC CATHETERIZATION     X 2    LAST ONE  2009  . CESAREAN SECTION     X  4  . CHOLECYSTECTOMY    . INSERT / REPLACE / REMOVE PACEMAKER     ST  JUDE  DEVICE  . JOINT REPLACEMENT     BIL  KNEES  . TONSILLECTOMY    . TUBAL LIGATION      Home Medications:  Allergies as of 01/10/2019       Reactions   Ciprofloxacin Hcl Itching   Rash and itching all over stomach and arms   Dopamine Nausea And Vomiting   Sulfa Antibiotics Rash   Sulfacetamide Sodium Rash   Tegaderm Ag Mesh [silver] Other (See Comments), Rash   Blisters and takes skin off      Medication List       Accurate as of January 10, 2019 10:37 AM. If you have any questions, ask your nurse or doctor.        STOP taking these medications   mirabegron ER 50 MG Tb24 tablet Commonly known as: MYRBETRIQ Stopped by: Martina SinnerScott A Kaila Devries, MD     TAKE these medications   alendronate 70 MG tablet Commonly known as: FOSAMAX TAKE 1 TABLET BY MOUTH EVERY 7 DAYS TAKE FIRST THING IN THE MORNING WITH A FULL GLASS WATER DO NOT EAT OR DRINK ANYTHING ELSE OR LIE DOWN   B-D ULTRA-FINE 33 LANCETS Misc Use 1 each once daily.   carvedilol 12.5 MG tablet Commonly known as: COREG Take 12.5 mg by mouth 2 (two) times daily with a meal.   dicyclomine 10 MG capsule Commonly known as: BENTYL Take by mouth.   furosemide 20 MG tablet Commonly known as: LASIX Take by mouth.   gabapentin 100 MG capsule Commonly known as: NEURONTIN TAKE 1 CAPSULE BY MOUTH EVERY NIGHT   hydrochlorothiazide 25 MG tablet Commonly known as: HYDRODIURIL Take by mouth.   metFORMIN 500 MG tablet Commonly known as: GLUCOPHAGE Take 500 mg by mouth 2 (two) times daily with a meal.   omeprazole 20 MG capsule Commonly known as: PRILOSEC Take 20 mg by mouth daily.   ONE TOUCH ULTRA TEST test strip Generic drug: glucose blood Use once daily. Use as instructed.   pioglitazone 15 MG tablet Commonly known as: ACTOS Take 15 mg by mouth daily.   polyethylene glycol-electrolytes 420 g solution Commonly known as: NuLYTELY/GoLYTELY Take by mouth.   quinapril 40 MG tablet Commonly known as: ACCUPRIL Take 40 mg by mouth daily.   RA Vitamin B-12 TR 1000 MCG Tbcr Generic drug: Cyanocobalamin Take by mouth.   rosuvastatin 10 MG tablet Commonly  known as: CRESTOR Take 10 mg by mouth daily.   sertraline 100 MG tablet Commonly known as: ZOLOFT Take 100 mg by mouth daily.   triamcinolone cream 0.1 % Commonly known as: KENALOG APPLY TO AFFECTED AREA(S) TWICE DAILY ASNEEDED. MAY USE FOR 7 TO 10 DAYS, THEN TAKE 7 DAYS OFF, MAY REPEAT AS NEEDED.   Vitamin D-1000 Max St 25 MCG (1000 UT) tablet  Generic drug: Cholecalciferol Take by mouth.   warfarin 2 MG tablet Commonly known as: COUMADIN Take 2 mg by mouth daily.       Allergies:  Allergies  Allergen Reactions  . Ciprofloxacin Hcl Itching    Rash and itching all over stomach and arms  . Dopamine Nausea And Vomiting  . Sulfa Antibiotics Rash  . Sulfacetamide Sodium Rash  . Tegaderm Ag Mesh [Silver] Other (See Comments) and Rash    Blisters and takes skin off    Family History: Family History  Problem Relation Age of Onset  . Kidney disease Neg Hx   . Bladder Cancer Neg Hx     Social History:  reports that she quit smoking about 27 years ago. Her smoking use included cigarettes. She has a 20.00 pack-year smoking history. She has never used smokeless tobacco. She reports that she does not drink alcohol or use drugs.  ROS:                                        Physical Exam: BP 136/78   Pulse 84   Ht 5\' 3"  (1.6 m)   Wt 121.6 kg   BMI 47.47 kg/m   Constitutional:  Alert and oriented, No acute distress.   Laboratory Data: Lab Results  Component Value Date   WBC 11.0 (H) 01/20/2013   HGB 12.0 01/20/2013   HCT 36.4 01/20/2013   MCV 93.3 01/20/2013   PLT 205 01/20/2013    Lab Results  Component Value Date   CREATININE 0.74 01/20/2013    No results found for: PSA  No results found for: TESTOSTERONE  No results found for: HGBA1C  Urinalysis    Component Value Date/Time   COLORURINE Yellow 12/07/2012 1209   APPEARANCEUR Cloudy (A) 02/17/2018 1247   LABSPEC 1.012 12/07/2012 1209   PHURINE 6.0 12/07/2012 1209   GLUCOSEU  Negative 02/17/2018 1247   GLUCOSEU Negative 12/07/2012 1209   HGBUR 1+ 12/07/2012 1209   BILIRUBINUR Negative 02/17/2018 1247   BILIRUBINUR Negative 12/07/2012 1209   KETONESUR Negative 12/07/2012 1209   PROTEINUR 1+ (A) 02/17/2018 1247   PROTEINUR Negative 12/07/2012 1209   NITRITE Negative 02/17/2018 1247   NITRITE Positive 12/07/2012 1209   LEUKOCYTESUR 1+ (A) 02/17/2018 1247   LEUKOCYTESUR 1+ 12/07/2012 1209    Pertinent Imaging:   Assessment & Plan: Proceed with test simulation.  Handout given.  There are no diagnoses linked to this encounter.  No follow-ups on file.  Reece Packer, MD  [place] [address], [place], [place] [address] [phone]

## 2019-01-14 ENCOUNTER — Other Ambulatory Visit
Admission: RE | Admit: 2019-01-14 | Discharge: 2019-01-14 | Disposition: A | Discharge status: Home / Self Care | Payer: Medicare Other | Source: Ambulatory Visit | Attending: Cardiology | Admitting: Cardiology

## 2019-01-14 DIAGNOSIS — Z20828 Contact with and (suspected) exposure to other viral communicable diseases: Secondary | ICD-10-CM | POA: Insufficient documentation

## 2019-01-14 DIAGNOSIS — Z01812 Encounter for preprocedural laboratory examination: Secondary | ICD-10-CM | POA: Insufficient documentation

## 2019-01-15 LAB — SARS CORONAVIRUS 2 (TAT 6-24 HRS): SARS Coronavirus 2: NEGATIVE

## 2019-01-18 ENCOUNTER — Encounter
Admission: RE | Disposition: A | Discharge status: Home / Self Care | Payer: Self-pay | Source: Home / Self Care | Attending: Cardiology

## 2019-01-18 ENCOUNTER — Ambulatory Visit: Payer: Medicare Other | Admitting: Certified Registered Nurse Anesthetist

## 2019-01-18 ENCOUNTER — Other Ambulatory Visit: Payer: Self-pay

## 2019-01-18 ENCOUNTER — Encounter: Payer: Self-pay | Admitting: Cardiology

## 2019-01-18 ENCOUNTER — Ambulatory Visit
Admission: RE | Admit: 2019-01-18 | Discharge: 2019-01-18 | Disposition: A | Discharge status: Home / Self Care | Payer: Medicare Other | Attending: Cardiology | Admitting: Cardiology

## 2019-01-18 DIAGNOSIS — I472 Ventricular tachycardia: Secondary | ICD-10-CM | POA: Insufficient documentation

## 2019-01-18 DIAGNOSIS — E782 Mixed hyperlipidemia: Secondary | ICD-10-CM | POA: Diagnosis not present

## 2019-01-18 DIAGNOSIS — I4821 Permanent atrial fibrillation: Secondary | ICD-10-CM | POA: Diagnosis not present

## 2019-01-18 DIAGNOSIS — Z7901 Long term (current) use of anticoagulants: Secondary | ICD-10-CM | POA: Diagnosis not present

## 2019-01-18 DIAGNOSIS — Z8249 Family history of ischemic heart disease and other diseases of the circulatory system: Secondary | ICD-10-CM | POA: Insufficient documentation

## 2019-01-18 DIAGNOSIS — Z833 Family history of diabetes mellitus: Secondary | ICD-10-CM | POA: Diagnosis not present

## 2019-01-18 DIAGNOSIS — I25119 Atherosclerotic heart disease of native coronary artery with unspecified angina pectoris: Secondary | ICD-10-CM | POA: Diagnosis not present

## 2019-01-18 DIAGNOSIS — M1990 Unspecified osteoarthritis, unspecified site: Secondary | ICD-10-CM | POA: Insufficient documentation

## 2019-01-18 DIAGNOSIS — Z8261 Family history of arthritis: Secondary | ICD-10-CM | POA: Insufficient documentation

## 2019-01-18 DIAGNOSIS — I110 Hypertensive heart disease with heart failure: Secondary | ICD-10-CM | POA: Diagnosis not present

## 2019-01-18 DIAGNOSIS — Z8371 Family history of colonic polyps: Secondary | ICD-10-CM | POA: Diagnosis not present

## 2019-01-18 DIAGNOSIS — Z806 Family history of leukemia: Secondary | ICD-10-CM | POA: Insufficient documentation

## 2019-01-18 DIAGNOSIS — G4733 Obstructive sleep apnea (adult) (pediatric): Secondary | ICD-10-CM | POA: Insufficient documentation

## 2019-01-18 DIAGNOSIS — Z87891 Personal history of nicotine dependence: Secondary | ICD-10-CM | POA: Insufficient documentation

## 2019-01-18 DIAGNOSIS — Z794 Long term (current) use of insulin: Secondary | ICD-10-CM | POA: Diagnosis not present

## 2019-01-18 DIAGNOSIS — I081 Rheumatic disorders of both mitral and tricuspid valves: Secondary | ICD-10-CM | POA: Diagnosis not present

## 2019-01-18 DIAGNOSIS — Z45018 Encounter for adjustment and management of other part of cardiac pacemaker: Secondary | ICD-10-CM | POA: Diagnosis present

## 2019-01-18 DIAGNOSIS — Z4502 Encounter for adjustment and management of automatic implantable cardiac defibrillator: Secondary | ICD-10-CM | POA: Insufficient documentation

## 2019-01-18 DIAGNOSIS — I509 Heart failure, unspecified: Secondary | ICD-10-CM | POA: Insufficient documentation

## 2019-01-18 DIAGNOSIS — Z823 Family history of stroke: Secondary | ICD-10-CM | POA: Diagnosis not present

## 2019-01-18 DIAGNOSIS — Z8673 Personal history of transient ischemic attack (TIA), and cerebral infarction without residual deficits: Secondary | ICD-10-CM | POA: Diagnosis not present

## 2019-01-18 DIAGNOSIS — Z79899 Other long term (current) drug therapy: Secondary | ICD-10-CM | POA: Insufficient documentation

## 2019-01-18 DIAGNOSIS — E119 Type 2 diabetes mellitus without complications: Secondary | ICD-10-CM | POA: Diagnosis not present

## 2019-01-18 DIAGNOSIS — I495 Sick sinus syndrome: Secondary | ICD-10-CM | POA: Diagnosis not present

## 2019-01-18 DIAGNOSIS — F419 Anxiety disorder, unspecified: Secondary | ICD-10-CM | POA: Insufficient documentation

## 2019-01-18 DIAGNOSIS — G4730 Sleep apnea, unspecified: Secondary | ICD-10-CM | POA: Insufficient documentation

## 2019-01-18 HISTORY — PX: IMPLANTABLE CARDIOVERTER DEFIBRILLATOR (ICD) GENERATOR CHANGE: SHX5469

## 2019-01-18 LAB — PROTIME-INR
INR: 1 (ref 0.8–1.2)
Prothrombin Time: 13.3 seconds (ref 11.4–15.2)

## 2019-01-18 LAB — GLUCOSE, CAPILLARY
Glucose-Capillary: 118 mg/dL — ABNORMAL HIGH (ref 70–99)
Glucose-Capillary: 118 mg/dL — ABNORMAL HIGH (ref 70–99)

## 2019-01-18 SURGERY — ICD GENERATOR CHANGE
Anesthesia: Monitor Anesthesia Care | Site: Chest | Laterality: Left

## 2019-01-18 MED ORDER — SODIUM CHLORIDE 0.9 % IV SOLN
INTRAVENOUS | Status: DC | PRN
  Administered 2019-01-18: 350 mL

## 2019-01-18 MED ORDER — LIDOCAINE HCL (PF) 2 % IJ SOLN
INTRAMUSCULAR | Status: AC
  Filled 2019-01-18: qty 10

## 2019-01-18 MED ORDER — CEPHALEXIN 250 MG PO CAPS: 500.0000 mg | ORAL_CAPSULE | Freq: Two times a day (BID) | ORAL | 0 refills | Status: DC

## 2019-01-18 MED ORDER — PROPOFOL 10 MG/ML IV BOLUS
INTRAVENOUS | Status: DC | PRN
  Administered 2019-01-18 (×2): 30 mg via INTRAVENOUS
  Administered 2019-01-18: 40 mg via INTRAVENOUS
  Administered 2019-01-18: 30 mg via INTRAVENOUS
  Administered 2019-01-18: 50 mg via INTRAVENOUS
  Administered 2019-01-18 (×5): 30 mg via INTRAVENOUS
  Administered 2019-01-18: 40 mg via INTRAVENOUS
  Administered 2019-01-18: 30 mg via INTRAVENOUS

## 2019-01-18 MED ORDER — CEPHALEXIN 250 MG PO CAPS: 500.0000 mg | ORAL_CAPSULE | Freq: Four times a day (QID) | ORAL | 0 refills | Status: DC

## 2019-01-18 MED ORDER — ONDANSETRON HCL 4 MG/2ML IJ SOLN: 4.0000 mg | Freq: Once | INTRAMUSCULAR | Status: DC | PRN

## 2019-01-18 MED ORDER — PROPOFOL 500 MG/50ML IV EMUL
INTRAVENOUS | Status: AC
  Filled 2019-01-18: qty 50

## 2019-01-18 MED ORDER — CEFAZOLIN SODIUM-DEXTROSE 2-4 GM/100ML-% IV SOLN
2.0000 g | INTRAVENOUS | Status: AC
  Administered 2019-01-18: 2 g via INTRAVENOUS

## 2019-01-18 MED ORDER — GENTAMICIN SULFATE 40 MG/ML IJ SOLN
INTRAMUSCULAR | Status: AC
  Filled 2019-01-18: qty 2

## 2019-01-18 MED ORDER — LIDOCAINE HCL (CARDIAC) PF 100 MG/5ML IV SOSY
PREFILLED_SYRINGE | INTRAVENOUS | Status: DC | PRN
  Administered 2019-01-18: 60 mg via INTRAVENOUS

## 2019-01-18 MED ORDER — FENTANYL CITRATE (PF) 100 MCG/2ML IJ SOLN: 25.0000 ug | INTRAMUSCULAR | Status: DC | PRN

## 2019-01-18 MED ORDER — SODIUM CHLORIDE 0.9 % IV SOLN
80.0000 mg | INTRAVENOUS | Status: DC
  Filled 2019-01-18: qty 2

## 2019-01-18 MED ORDER — SODIUM CHLORIDE 0.9 % IV SOLN: INTRAVENOUS | Status: DC

## 2019-01-18 MED ORDER — LIDOCAINE HCL 1 % IJ SOLN
INTRAMUSCULAR | Status: DC | PRN
  Administered 2019-01-18: 30 mL

## 2019-01-18 MED ORDER — CEFAZOLIN SODIUM-DEXTROSE 2-4 GM/100ML-% IV SOLN
INTRAVENOUS | Status: AC
  Filled 2019-01-18: qty 100

## 2019-01-18 SURGICAL SUPPLY — 25 items
BAG DECANTER FOR FLEXI CONT (MISCELLANEOUS) ×2 IMPLANT
BLADE PHOTON ILLUMINATED (MISCELLANEOUS) ×2 IMPLANT
CABLE SURG 12 DISP A/V CHANNEL (MISCELLANEOUS) ×1 IMPLANT
CANISTER SUCT 1200ML W/VALVE (MISCELLANEOUS) ×2 IMPLANT
CHLORAPREP W/TINT 26 (MISCELLANEOUS) ×2 IMPLANT
COVER LIGHT HANDLE STERIS (MISCELLANEOUS) ×4 IMPLANT
COVER MAYO STAND REUSABLE (DRAPES) ×2 IMPLANT
COVER WAND RF STERILE (DRAPES) ×2 IMPLANT
DRSG TEGADERM 4X4.75 (GAUZE/BANDAGES/DRESSINGS) ×2 IMPLANT
DRSG TELFA 4X3 1S NADH ST (GAUZE/BANDAGES/DRESSINGS) ×2 IMPLANT
ELECT REM PT RETURN 9FT ADLT (ELECTROSURGICAL) ×2
ELECTRODE REM PT RTRN 9FT ADLT (ELECTROSURGICAL) ×1 IMPLANT
GLOVE BIO SURGEON STRL SZ7.5 (GLOVE) ×2 IMPLANT
GLOVE BIO SURGEON STRL SZ8 (GLOVE) ×2 IMPLANT
GOWN STRL REUS W/ TWL LRG LVL3 (GOWN DISPOSABLE) ×1 IMPLANT
GOWN STRL REUS W/ TWL XL LVL3 (GOWN DISPOSABLE) ×1 IMPLANT
GOWN STRL REUS W/TWL LRG LVL3 (GOWN DISPOSABLE) ×1
GOWN STRL REUS W/TWL XL LVL3 (GOWN DISPOSABLE) ×1
ICD CLARIA MRI DTMA1D1 (ICD Generator) ×1 IMPLANT
KIT TURNOVER KIT A (KITS) ×2 IMPLANT
KIT WRENCH (KITS) ×1 IMPLANT
MARKER SKIN DUAL TIP RULER LAB (MISCELLANEOUS) ×2 IMPLANT
PACK PACE INSERTION (MISCELLANEOUS) ×2 IMPLANT
PAD ONESTEP ZOLL R SERIES ADT (MISCELLANEOUS) ×2 IMPLANT
STRAP SAFETY 5IN WIDE (MISCELLANEOUS) ×2 IMPLANT

## 2019-01-18 NOTE — Transfer of Care (Signed)
Immediate Anesthesia Transfer of Care Note  Patient: [name]  Procedure(s) Performed: ICD GENERATOR CHANGE (Left Chest)  Patient Location: PACU  Anesthesia Type:MAC  Level of Consciousness: awake, alert  and oriented  Airway & Oxygen Therapy: Patient Spontanous Breathing  Post-op Assessment: Report given to RN, Post -op Vital signs reviewed and stable and Patient moving all extremities  Post vital signs: Reviewed and stable  Last Vitals:  Vitals Value Taken Time  BP 168/78 01/18/19 1541  Temp 36.9 C 01/18/19 1541  Pulse 70 01/18/19 1543  Resp 25 01/18/19 1543  SpO2 96 % 01/18/19 1543  Vitals shown include unvalidated device data.  Last Pain:  Vitals:   01/18/19 1541  TempSrc:   PainSc: 0-No pain         Complications: No apparent anesthesia complications

## 2019-01-18 NOTE — Op Note (Signed)
[facility]   01/18/2019                     3:41 PM  PATIENT:  [name]    PRE-OPERATIVE DIAGNOSIS:  battery ERI  POST-OPERATIVE DIAGNOSIS:  Same  PROCEDURE:  ICD GENERATOR CHANGE  SURGEON:  Isaias Cowman, MD    ANESTHESIA:     PREOPERATIVE INDICATIONS:  [name] is a  78 y.o. female with a diagnosis of battery ERI who failed conservative measures and elected for surgical management.    The risks benefits and alternatives were discussed with the patient preoperatively including but not limited to the risks of infection, bleeding, cardiopulmonary complications, the need for revision surgery, among others, and the patient was willing to proceed.   OPERATIVE PROCEDURE: The patient was brought to the operating room in a fasting state.  The left pectoral region was prepped and draped in usual sterile manner.  Anesthesia was obtained 1% lidocaine locally.  The existing ICD generator was retrieved electrocautery and blunt dissection.  Following thorough dissection, the leads were disconnected and connected to a new MRI compatible CRT-D pacemaker defibrillator ( Med[employer]onic Ch[place]vin MRI CRTD [id number] ).  The device pocket was irrigated with gentamicin solution.  The ICD generator was positioned into the pocket and the pocket was closed with 2-0 and 4-0 Vicryl, respectively.  Steri-Strips and pressure dressing were applied.  Postprocedural interrogation revealed appropriate atrial, right ventricular, and left ventricular sensing and pacing thresholds.  There were no periprocedural complications.   ICD Criteria  Current LVEF:40%. Within 12 months prior to implant: Yes   Heart failure history: Yes, Class II  Cardiomyopathy history: Yes, Non-Ischemic Cardiomyopathy.  Atrial Fibrillation/Atrial Flutter: Yes, Persistent (> 7 Days).  Ventricular tachycardia history:  No.  Cardiac arrest history: No.  History of syndromes with risk of sudden death: No.  Previous ICD: Yes, Reason for ICD:  Primary prevention.  Current ICD indication: Secondary  PPM indication: Yes. Pacing type: Both. Greater than 40% RV pacing requirement anticipated. Indication: Complete Heart Block   Class I or II Bradycardia indication present: Yes  Beta Blocker therapy for 3 or more months: Yes, prescribed.   Ace Inhibitor/ARB therapy for 3 or more months: Yes, prescribed.

## 2019-01-18 NOTE — H&P (Signed)
Jump to Section ? Document InformationEncounter DetailsGoalsImaging ResultsLab ResultsLast Filed Vital SignsPatient ContactsPatient DemographicsPlan of TreatmentProgress NotesReason for VisitSocial HistoryVisit Diagnoses [name] Encounter Summary, generated on Dec. 11, 2020December 11, 2020 Printout Information  Document Contents Document Received Date Document Source Organization  Office Visit Dec. 11, 2020December 11, 2020 [place]   Patient Demographics - 78 y.o. Female; born [birth year]  Patient Address Communication Language Race / Ethnicity Marital Status  [address] [place], [place] [address] [phone] [facility]) [phone] (Mobile) [email or web address] English (Preferred) [name] / Not Hispanic or Latino Legally Separated   Reason for Visit  Reason Comments  Pacer-ICD    Encounter Details  Date Type Department Care Team Description  01/06/2019 Office Visit [facility]  [place], [place] [address]  [phone]  Flossie Dibble, MD  [address]  [facility]  [place], [place] [address]  [phone]  [phone] ([address]    [address] [place], [place], [place] [address]  [phone]  [phone] (Fax)  Pacemaker at end of battery life (Primary Dx);  Pre-op testing;  Coronary artery disease involving native coronary artery of native heart without angina pectoris;  Chronic a-fib (CMS-HCC);  Benign essential hypertension;  Moderate mitral insufficiency;  Obstructive sleep apnea syndrome;  Controlled type 2 diabetes mellitus without complication, without long-term current use of insulin (CMS-HCC);  Hyperlipidemia, mixed   Social History - documented as of this encounter Tobacco Use Types Packs/Day Years Used Date  Former Smoker Cigarettes 1 82 Quit: 02/04/1988  Smokeless Tobacco: Never Used       Alcohol Use Drinks/Week oz/Week Comments  Yes 0 Standard drinks or equivalent  0.0 rare   Sex Assigned at [employer] Date Recorded  Not on file    COVID-19 Exposure Response Date Recorded  In the last month, have you been in contact with someone who was confirmed or suspected to have [name] / COVID-19? No / Unsure 01/06/2019 2:25 PM EST   Last Filed Vital Signs - documented in this encounter Vital Sign Reading Time Taken Comments  Blood Pressure 132/74 01/06/2019 2:43 PM EST   Pulse 86 01/06/2019 2:43 PM EST   Temperature - -   Respiratory Rate - -   Oxygen Saturation 94% 01/06/2019 2:43 PM EST   Inhaled Oxygen Concentration - -   Weight 122 kg (269 lb) 01/06/2019 2:43 PM EST   Height 160 cm (5' 3" ) 01/06/2019 2:43 PM EST   Body Mass Index 47.65 01/06/2019 2:43 PM EST    Progress Notes - documented in this encounter Mittie Bodo, [place] - 01/06/2019 2:30 PM EST Formatting of this note might be different from the original. Established Patient Visit   Chief Complaint: Chief Complaint  Patient presents with  . Pacer-ICD  Date of Service: 01/06/2019 Date of Birth: 1940-10-17 PCP: Velna Ochs, MD   History of Present Illness: Ms. Brookover is a 78 y.o. 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 The patient states  that she now feels somewhat better than she did during her last visit, although she continues to be significantly short of breath. She did take her diuretic for ~1  week, and now has returned to taking the diuretic PRN due to increased urinary incontinence which happens when taking this medication. She does report decreased leg swelling and decreased orthopnea now. She reports no other new symptoms or concerns. She continues to deny chest pain, palpitations, lightheadedness, or syncope.   She does not use a CPAP machines since a sleep study several years ago showed that her AHI was <5 and she no longer needed a CPAP machine.   Recent ECHO completed on 05/31/2018 showed mild LV dysfunction due to globally reduced wall motion and EF to 40%, unchanged from prior ECHO done 01/2017. 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   Past Medical and Surgical History  Past Medical History Past Medical History:  Diagnosis Date  . Atrial fibrillation (CMS-HCC)  . Cardiomyopathy, secondary (CMS-HCC)  . CHF (congestive heart failure) (CMS-HCC)  . Hyperlipidemia  . Hypertension  . Loss of smell  . Loss of taste  . Microalbuminuria  . Osteoarthritis  . Pulmonary nodules  calcified  . Sleep apnea  . TIA (transient ischemic attack)   Past Surgical History She has a past surgical history that includes Transvenous Insertion Icd Electrodes (Left); knee replacement; Insert / replace / remove pacemaker; cariac cath (2008); Cholecystectomy; Cervical disc surgery C5 removed; Colonoscopy (09/25/2011, 08/20/2007); egd (09/25/2011, 08/20/2007); and botox injections to bladder (06/2014).   Medications and Allergies  Current Medications  Current Outpatient Medications  Medication Sig Dispense Refill  . alendronate (FOSAMAX) 70 MG tablet Take 1 tablet (70 mg total) by mouth every 7 (seven) days Take with a full glass of water. Do not lie down for the next 30 min. 12 tablet 3  . carvediloL (COREG) 12.5 MG tablet Take 1 tablet (12.5 mg total) by mouth 2  (two) times daily with meals 180 tablet 1  . cholecalciferol (CHOLECALCIFEROL) 1,000 unit tablet Take 1,000 Units by mouth once daily  . cyanocobalamin (VITAMIN B12) 1000 MCG tablet Take 1,000 mcg by mouth once daily.  . FUROsemide (LASIX) 20 MG tablet Take 2 tablets (40 mg total) by mouth once daily 60 tablet 3  . gabapentin (NEURONTIN) 100 MG capsule TAKE 2 CAPSULES BY MOUTH 3 TIMES DAILY 540 capsule 0  . lancets (ONETOUCH DELICA LANCETS) 33 gauge Misc Use 1 each once daily. 100 each 3  . metFORMIN (GLUCOPHAGE) 500 MG tablet TAKE 1 TABLET BY MOUTH TWICE A DAY WITH MEALS 180 tablet 0  . multivit-min-iron-FA-lutein (CENTRUM SILVER WOMEN) 8 mg iron-400 mcg-300 mcg Tab Take by mouth  . omeprazole (PRILOSEC) 20 MG DR capsule TAKE 1 CAPSULE BY MOUTH ONCE DAILY 90 capsule 1  . ONETOUCH ULTRA BLUE TEST STRIP test strip TEST BLOOD SUGAR EVERY DAY AS DIRECTED 100 each 3  . peg-electrolyte (NULYTELY) solution Take 4,000 mLs by mouth as directed Take as directed for colon prep. 4000 mL 0  . pioglitazone (ACTOS) 15 MG tablet Take 1 tablet (15 mg total) by mouth once daily 90 tablet 3  . quinapriL (ACCUPRIL) 40 MG tablet TAKE 1 TABLET BY MOUTH ONCE DAILY 90 tablet 0  . rosuvastatin (CRESTOR) 10 MG tablet TAKE 1 TABLET BY MOUTH ONCE A DAY 90 tablet 2  . sertraline (ZOLOFT) 100 MG tablet TAKE  1 TABLET BY MOUTH ONCE DAILY 90 tablet 3  . triamcinolone 0.1 % cream Apply topically 2 (two) times daily as needed 15 g 0  . warfarin (COUMADIN) 2 MG tablet TAKE 1 TABLET BY MOUTH ONCE DAILY 30 tablet 5  . warfarin (COUMADIN) 2.5 MG tablet TAKE 1 TABLET BY MOUTH ONCE A DAY 90 tablet 1   No current facility-administered medications for this visit.   Allergies: Dopamine, Sulfa (sulfonamide antibiotics), Ciprofloxacin hcl, Sulfacetamide sodium, and Tegaderm ag mesh [silver]  Social and Family History  Social History reports that she quit smoking about 30 years ago. Her smoking use included cigarettes. She has a 30.00  pack-year smoking history. She has never used smokeless tobacco. She reports current alcohol use. Drug use questions deferred to the physician.  Family History Family History  Problem Relation Age of Onset  . Myocardial Infarction (Heart attack) Father  . Diabetes type II Father  . Colon polyps Mother  . Atrial fibrillation (Abnormal heart rhythm sometimes requiring treatment with blood thinners) Son  . Leukemia Other  aunt  . Stroke Other  . Arthritis Other   Review of Systems   Review of Systems: Positive for fatigue, depressed mood, chronic dyspnea  The patient denies chest pain, orthopnea, paroxysmal nocturnal dyspnea, pedal edema, palpitations, heart racing, dizziness, lightheadedness, presyncope, syncope, leg pain, leg cramping.  Review of 12 Systems is negative except as described above.  Physical Examination   Vitals:BP 132/74  Pulse 86  Ht 160 cm (5' 3" )  Wt (!) 122 kg (269 lb)  LMP (LMP Unknown)  SpO2 94%  BMI 47.65 kg/m  Ht:160 cm (5' 3" ) Wt:(!) 122 kg (269 lb) DHR:CBUL surface area is 2.33 meters squared. Body mass index is 47.65 kg/m.  Constitutional: Morbidly obese. Alert, well developed, in no acute distress HEENT: Pupils equally reactive to light and accomodation  Neck: Supple. Carotid pulse 2+ bilaterally Lungs: Clear to auscultation bilaterally; no wheezes, rales, rhonchi Heart: Irregularly irregular rhythm. Normal rate. 1-2/6 murmur best heard at the left upper sternal border.  Extremities: Trace pitting edema to ankles bilaterally. No cyanosis.  Peripheral Pulses: 2+ in upper extremities, 2+ in lower extremities   Assessment   78 y.o. female with  1. Pacemaker at end of battery life  2. Pre-op testing  3. Coronary artery disease involving native coronary artery of native heart without angina pectoris  4. Chronic a-fib (CMS-HCC)  5. Benign essential hypertension  6. Moderate mitral insufficiency  7. Obstructive sleep apnea syndrome  8. Controlled  type 2 diabetes mellitus without complication, without long-term current use of insulin (CMS-HCC)  9. Hyperlipidemia, mixed   Plan   1. Permanent atrial fibrillation: - Anticoagulated with warfarin. INR goal of 2-3.  - Last INR check from 12/09/2018 was supratherapeutic at 3.1. Dose appropriate adjusted. - Rate remains well controlled with carvedilol. Continue current regimen.   2. Sick sinus syndrome s/p dual pacemaker placement, then upgraded to CRT-D in [place] in 2009: Pacer check completed on 12/19/2018 shows that her Bi-V is now at end of life. She will be scheduled for a battery change out procedure in the next two weeks. She remains on atrial sensing and ventricular pacing ~100% of the time. No events noted since last pacer check. - Proceed to battery change out procedure  - Pre-op testing: CBC, BMP, PT/INR, CXR  3. Heart failure with intermediate EF, Fatigue: ECHO from 05/2018 with EF of 40%. NYHA Class III symptoms. Patient remains on ACE and beta blocker. Patient using  diuretic PRN due to concerns about urinary incontinence and frequent UTIs given use of adult diaper. Shortness of breath unchanged from baseline. No signs of fluid overload on exam. Do not suspect acute CHF exacerbation at this time.  4. CAD as seen on cath from 2008 with 30% stenosis of the RCA: Remains on statin, and ACE inhibitor for cardiovascular risk reduction. Has no chest pain or anginal equivalent. Last stress test from 12/2016 with no evidence of ischemia. Continue current regimen.   5. Valvular insufficiency: Moderate mitral and tricuspid valve insufficiency seen on ECHO from 01/2017, now unchanged on repeat ECHO done on 05/31/2018. Will plan to follow with repeat ECHOs every 1-2 years or sooner as needed for change in symptoms.   6. HTN: Currently on carvedilol and quinapril for blood pressure control. Blood pressure is well controlled today. Continue current regimen.   7. HLD: Currently on  moderate intensity statin therapy with rosuvastatin 10 mg daily. Last lipid panel from 01/2018 reviewed - total cholesterol 173, HDL 66, LDL 86. Tolerating statin therapy well. Continue current regimen.   Orders Placed This Encounter  Procedures  . X-ray chest PA and lateral  . Basic Metabolic Panel (BMP)  . CBC w/auto Differential (5 Part)   No follow-ups on file.  MICHELLE Jimmey Ralph, PA-C    Electronically signed by Mittie Bodo, [place] at 01/07/2019 8:20 AM EST   Plan of Treatment - documented as of this encounter Upcoming Encounters Upcoming Encounters  Date Type Specialty Care Team Description  01/25/2019 Ancillary Orders Lab Velna Ochs, MD  [address] [place]  [place], [place] [address]  [phone]  [phone] (Fax)    01/26/2019 Office Visit Podiatry Troxler, Titus Dubin, DPM  [facility] [place]  [place], [place] [address]  [phone]  [phone] (Fax)    02/01/2019 Office Visit Internal Medicine Velna Ochs, [place]  [place], [place] [address]  [phone]  [phone] (Fax)     Goals - documented as of this encounter Goal Patient Goal Type Associated Problems Recent Progress Patient-Stated? Author  Medication management/adherance  Lifestyle   No Mat Carne, RN   Lab Results - documented in this encounter Table of Contents for Lab Results  CBC w/auto Differential (5 Part) (01/11/2019 10:36 AM EST)  Basic Metabolic Panel (BMP) (17/79/3903 10:36 AM EST)     CBC w/auto Differential (5 Part) (01/11/2019 10:36 AM EST) CBC w/auto Differential (5 Part) (01/11/2019 10:36 AM EST)  Component Value Ref Range Performed At Pathologist Signature  WBC (White Blood Cell Count) 6.8 4.1 - 10.2 10^3/uL [facility]   RBC (Red Blood Cell Count) 4.18 4.04 - 5.48 10^6/uL [facility]   Hemoglobin 12.7 12.0 - 15.0 gm/dL [facility]  [facility]   Hematocrit 40.5 35.0 - 47.0 % [facility]   MCV (Mean Corpuscular Volume) 96.9 80.0 - 100.0 fl [facility]   MCH (Mean Corpuscular Hemoglobin) 30.4 27.0 - 31.2 pg [facility]   MCHC (Mean Corpuscular Hemoglobin Concentration) 31.4 (L) 32.0 - 36.0 gm/dL [facility]   Platelet Count 201 150 - 450 10^3/uL [facility]   RDW-CV (Red Cell Distribution Width) 13.3 11.6 - 14.8 % [facility]   MPV (Mean Platelet Volume) 9.7 9.4 - 12.4 fl [facility]   Neutrophils 4.87 1.50 - 7.80 10^3/uL  [facility]   Lymphocytes 1.30 1.00 - 3.60 10^3/uL [facility]   Monocytes 0.49 0.00 - 1.50 10^3/uL [facility]   Eosinophils 0.07 0.00 - 0.55 10^3/uL [facility]   Basophils 0.02 0.00 - 0.09 10^3/uL [facility]   Neutrophil % 72.0 (H) 32.0 - 70.0 % [facility]   Lymphocyte % 19.2 10.0 - 50.0 % [facility]   Monocyte % 7.2 4.0 - 13.0 % [facility]   Eosinophil % 1.0 1.0 - 5.0 % [facility]   Basophil% 0.3 0.0 - 2.0 % [facility]   Immature Granulocyte % 0.3 <=0.7 % [facility]   Immature Granulocyte Count 0.02 <=0.06 10^3/L [facility]    CBC w/auto Differential (5 Part) (01/11/2019 10:36 AM EST)  Specimen  Blood   CBC w/auto Differential (5 Part) (01/11/2019 10:36 AM EST)  Performing Organization Address City/State/Zipcode Phone Number  [facility]  [place], [place] [address]     Back to top of Lab Results    Basic Metabolic Panel (BMP) (97/98/9211 10:36 AM EST) Basic Metabolic Panel (BMP) (94/17/4081 10:36 AM EST)  Component Value Ref Range Performed At Pathologist Signature  Glucose 138 (H) 70 - 110 mg/dL [facility]   Sodium 144 136 - 145  mmol/L [facility]   Potassium 4.3 3.6 - 5.1 mmol/L [facility]   Chloride 104 97 - 109 mmol/L [facility]   Carbon Dioxide (CO2) 33.3 (H) 22.0 - 32.0 mmol/L [facility]   Calcium 9.5 8.7 - 10.3 mg/dL [facility]   Urea Nitrogen (BUN) 15 7 - 25 mg/dL [facility]   Creatinine 0.7 0.6 - 1.1 mg/dL [facility]   Glomerular Filtration Rate (eGFR), MDRD Estimate 81 >60 mL/min/1.73sq m [facility]   BUN/Crea Ratio 21.4 (H) 6.0 - 20.0 [facility]   Anion Gap w/K 11.0 6.0 - 16.0 [facility]    Basic Metabolic Panel (BMP) (44/81/8563 10:36 AM EST)  Specimen  Blood   Basic Metabolic Panel (BMP) (14/97/0263 10:36 AM EST)  Performing Organization Address City/State/Zipcode Phone Number  [place]  [place],  [phone]     Back to top of Lab Results  Imaging Results - documented in this encounter  X-ray chest PA and lateral (01/06/2019 4:02 PM EST) X-ray chest PA and lateral (01/06/2019 4:02 PM EST)  Specimen     X-ray chest PA and lateral (01/06/2019 4:02 PM EST)  Narrative Performed At  This result has an attachment that is not available.        Visit Diagnoses - documented in this encounter Diagnosis  Pacemaker at end of battery life - Primary  Fitting and adjustment of cardiac pacemaker   Pre-op testing  Unspecified pre-operative examination   Coronary artery disease involving native coronary artery of native heart without angina pectoris   Chronic a-fib (CMS-HCC)  Atrial fibrillation   Benign essential hypertension  Essential hypertension, benign   Moderate mitral insufficiency   Obstructive sleep apnea syndrome  Obstructive sleep apnea (adult) (pediatric)   Controlled type  2 diabetes mellitus without complication, without long-term current use of insulin  (CMS-HCC)   Hyperlipidemia, mixed  Mixed hyperlipidemia   Images  Patient Contacts  Contact Name Contact Address Communication Relationship to Patient  Andreya Lacks III [address] [place], [place] [address] [phone] (Mobile) Son or Daughter, Guardian  Document Information  Primary Care Provider Other Service Providers Document Coverage Dates  Velna Ochs, MD (Oct. 31, 2018October 31, 2018 - Present) [phone] (Work) [phone] (Fax) [place]ona Brandon, [place] [address] Internal Medicine    Dec. 03, 2020December 03, 2020 - Dec. 07, 2020December 07, 2020   [place] [address] [place], [place] [address]   Encounter Providers Encounter Date  Tinnie Gens Gerald, [place] (Attending) [phone] (Work) [phone] (Fax) [place],  [address] Cardiovascular Disease Dec. 03, 2020December 03, 2020 - Dec. 07, 2020December 07, 2020    Show All Sections

## 2019-01-18 NOTE — Anesthesia Postprocedure Evaluation (Signed)
Anesthesia Post Note  Patient: [name]  Procedure(s) Performed: ICD GENERATOR CHANGE (Left Chest)  Patient location during evaluation: PACU Anesthesia Type: MAC Level of consciousness: awake and alert Pain management: pain level controlled Vital Signs Assessment: post-procedure vital signs reviewed and stable Respiratory status: spontaneous breathing, nonlabored ventilation, respiratory function stable and patient connected to nasal cannula oxygen Cardiovascular status: blood pressure returned to baseline and stable Postop Assessment: no apparent nausea or vomiting Anesthetic complications: no     Last Vitals:  Vitals:   01/18/19 1541 01/18/19 1556  BP: (!) 168/78 (!) 149/97  Pulse: 70 70  Resp: 16 (!) 23  Temp: 36.9 C   SpO2: 96% 96%    Last Pain:  Vitals:   01/18/19 1556  TempSrc:   PainSc: 0-No pain                 Precious Haws Antoino Westhoff

## 2019-01-18 NOTE — Discharge Instructions (Addendum)
May remove outer bandage on 01/19/2019.  Leave Steri-Strips on.  May shower 01/19/2019.    AMBULATORY SURGERY  DISCHARGE INSTRUCTIONS   1) The drugs that you were given will stay in your system until tomorrow so for the next 24 hours you should not:  A) Drive an automobile B) Make any legal decisions C) Drink any alcoholic beverage   2) You may resume regular meals tomorrow.  Today it is better to start with liquids and gradually work up to solid foods.  You may eat anything you prefer, but it is better to start with liquids, then soup and crackers, and gradually work up to solid foods.   3) Please notify your doctor immediately if you have any unusual bleeding, trouble breathing, redness and pain at the surgery site, drainage, fever, or pain not relieved by medication.    4) Additional Instructions:        Please contact your physician with any problems or Same Day Surgery at [phone], Monday through Friday 6 am to 4 pm, or [place] at [facility] number at [phone].   Cephalexin prescription has been escribed to [facility]. Please take as prescribed.

## 2019-01-18 NOTE — OR Nursing (Signed)
Discussed rx with Dr. Saralyn Pilar, advises he wants pt to have Keflex 500mg  twice daily for 7 days.  RX in chart incorrect and shredded.  RX sent to pharmacy by MD adjusted to what he wants pt to have.

## 2019-01-18 NOTE — Anesthesia Post-op Follow-up Note (Signed)
Anesthesia QCDR form completed.        

## 2019-01-18 NOTE — Anesthesia Preprocedure Evaluation (Signed)
Anesthesia Evaluation  Patient identified by MRN, date of birth, ID band Patient awake    Reviewed: Allergy & Precautions, H&P , NPO status , Patient's Chart, lab work & pertinent test results, reviewed documented beta blocker date and time   History of Anesthesia Complications (+) PROLONGED EMERGENCE and history of anesthetic complications  Airway Mallampati: II  TM Distance: >3 FB Neck ROM: full    Dental no notable dental hx. (+) Teeth Intact   Pulmonary shortness of breath, sleep apnea and Continuous Positive Airway Pressure Ventilation , former smoker,    Pulmonary exam normal breath sounds clear to auscultation       Cardiovascular Exercise Tolerance: Good hypertension, + angina with exertion +CHF  + dysrhythmias Ventricular Tachycardia + Cardiac Defibrillator  Rhythm:regular Rate:Normal     Neuro/Psych  Headaches, Anxiety TIA Neuromuscular disease CVA negative psych ROS   GI/Hepatic negative GI ROS, Neg liver ROS,   Endo/Other  negative endocrine ROSdiabetes  Renal/GU      Musculoskeletal   Abdominal   Peds  Hematology negative hematology ROS (+)   Anesthesia Other Findings   Reproductive/Obstetrics negative OB ROS                             Anesthesia Physical Anesthesia Plan  ASA: IV  Anesthesia Plan: MAC   Post-op Pain Management:    Induction:   PONV Risk Score and Plan:   Airway Management Planned:   Additional Equipment:   Intra-op Plan:   Post-operative Plan:   Informed Consent: I have reviewed the patients History and Physical, chart, labs and discussed the procedure including the risks, benefits and alternatives for the proposed anesthesia with the patient or authorized representative who has indicated his/her understanding and acceptance.       Plan Discussed with: CRNA  Anesthesia Plan Comments:         Anesthesia Quick Evaluation

## 2019-01-25 ENCOUNTER — Encounter: Payer: Self-pay | Admitting: *Deleted

## 2019-03-28 ENCOUNTER — Other Ambulatory Visit: Payer: Self-pay | Admitting: Internal Medicine

## 2019-03-28 DIAGNOSIS — M533 Sacrococcygeal disorders, not elsewhere classified: Secondary | ICD-10-CM

## 2019-04-06 IMAGING — CR DG CHEST 2V
2 series · 2 of 2 positions shown · non-contrast
Comparison: Chest x-ray of April 13, 2014

CLINICAL DATA: Re- bariatric evaluation for possible gastric sleeve
procedure. History of CHF, atrial fibrillation, former smoker, TIA.

EXAM:
CHEST  2 VIEW

[chest pa]
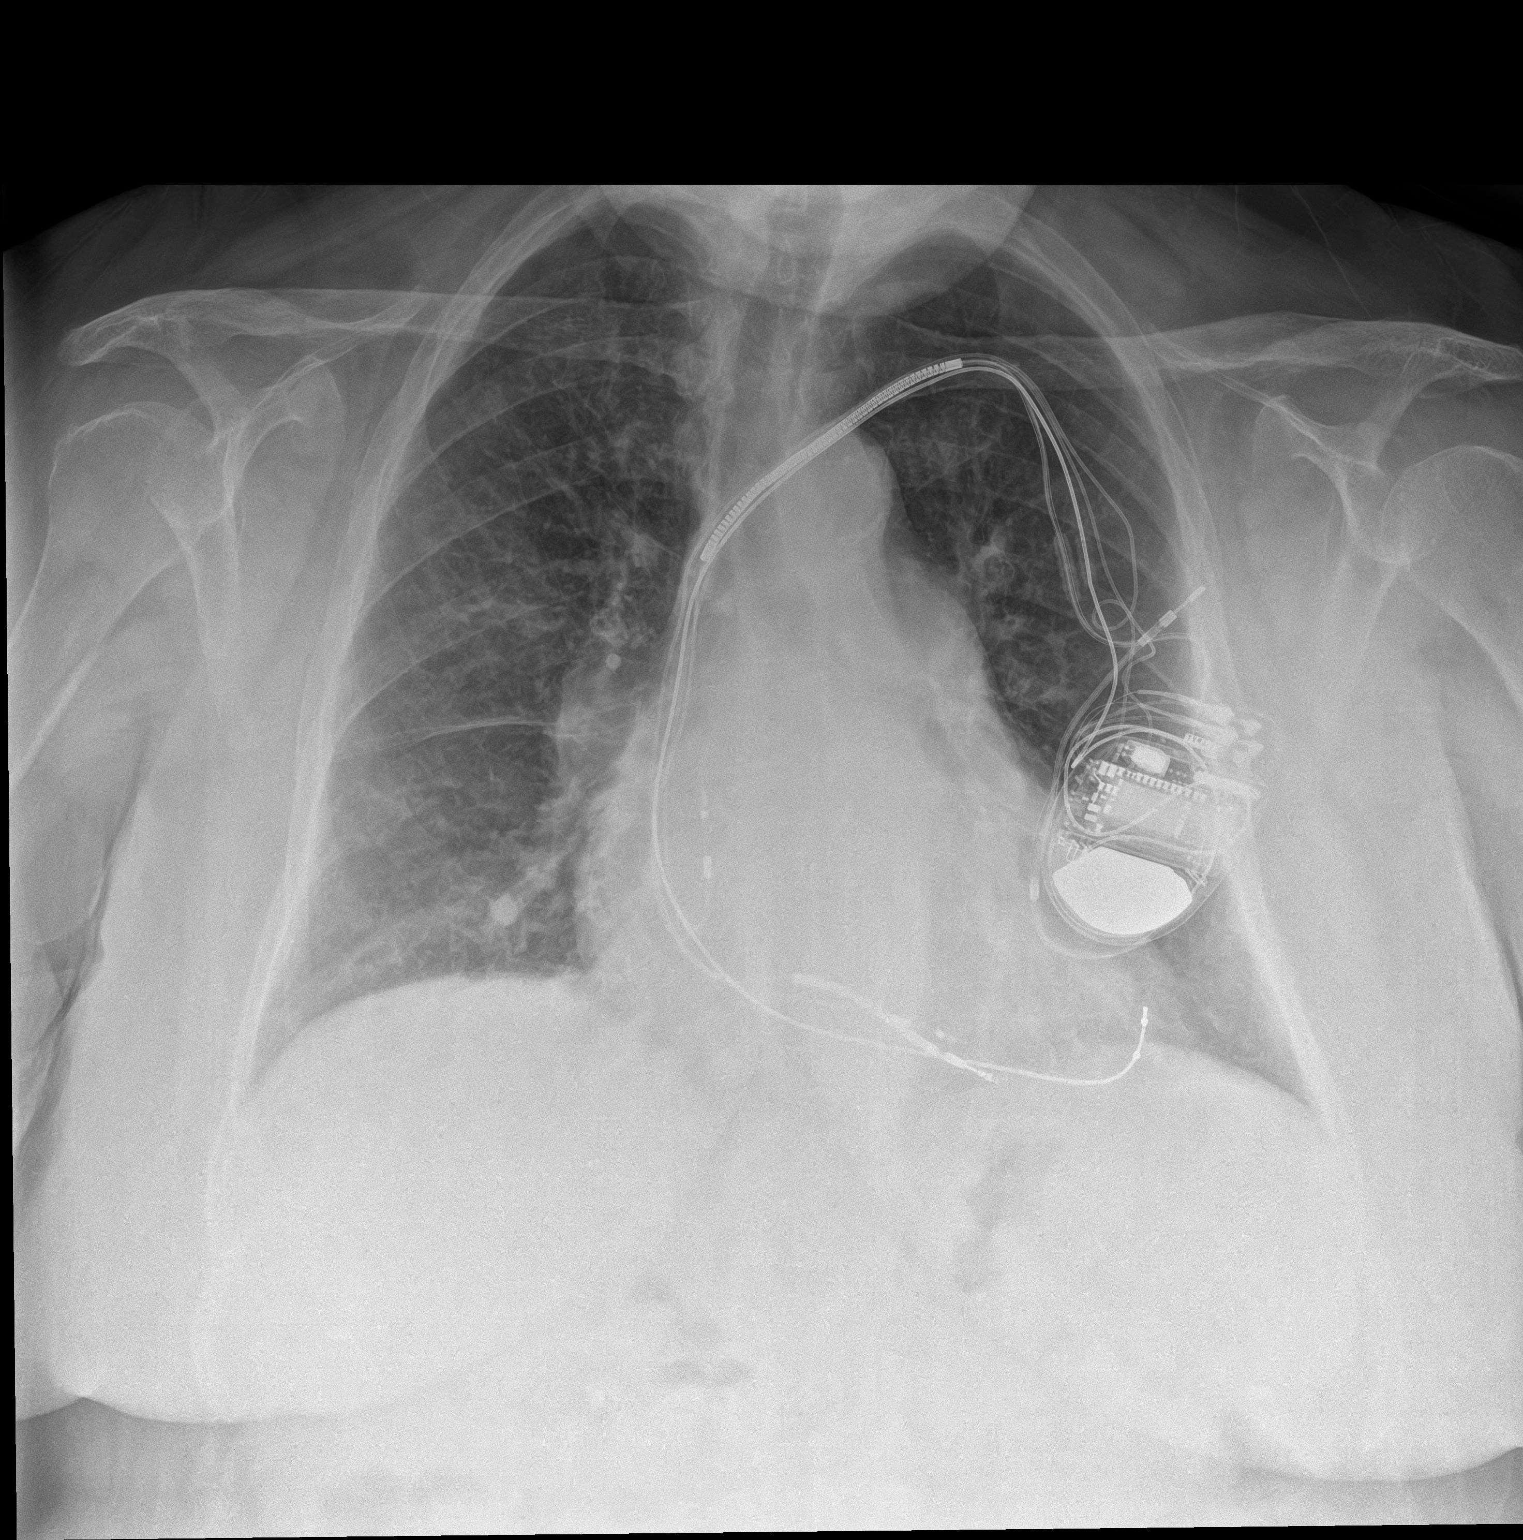

[chest lat]
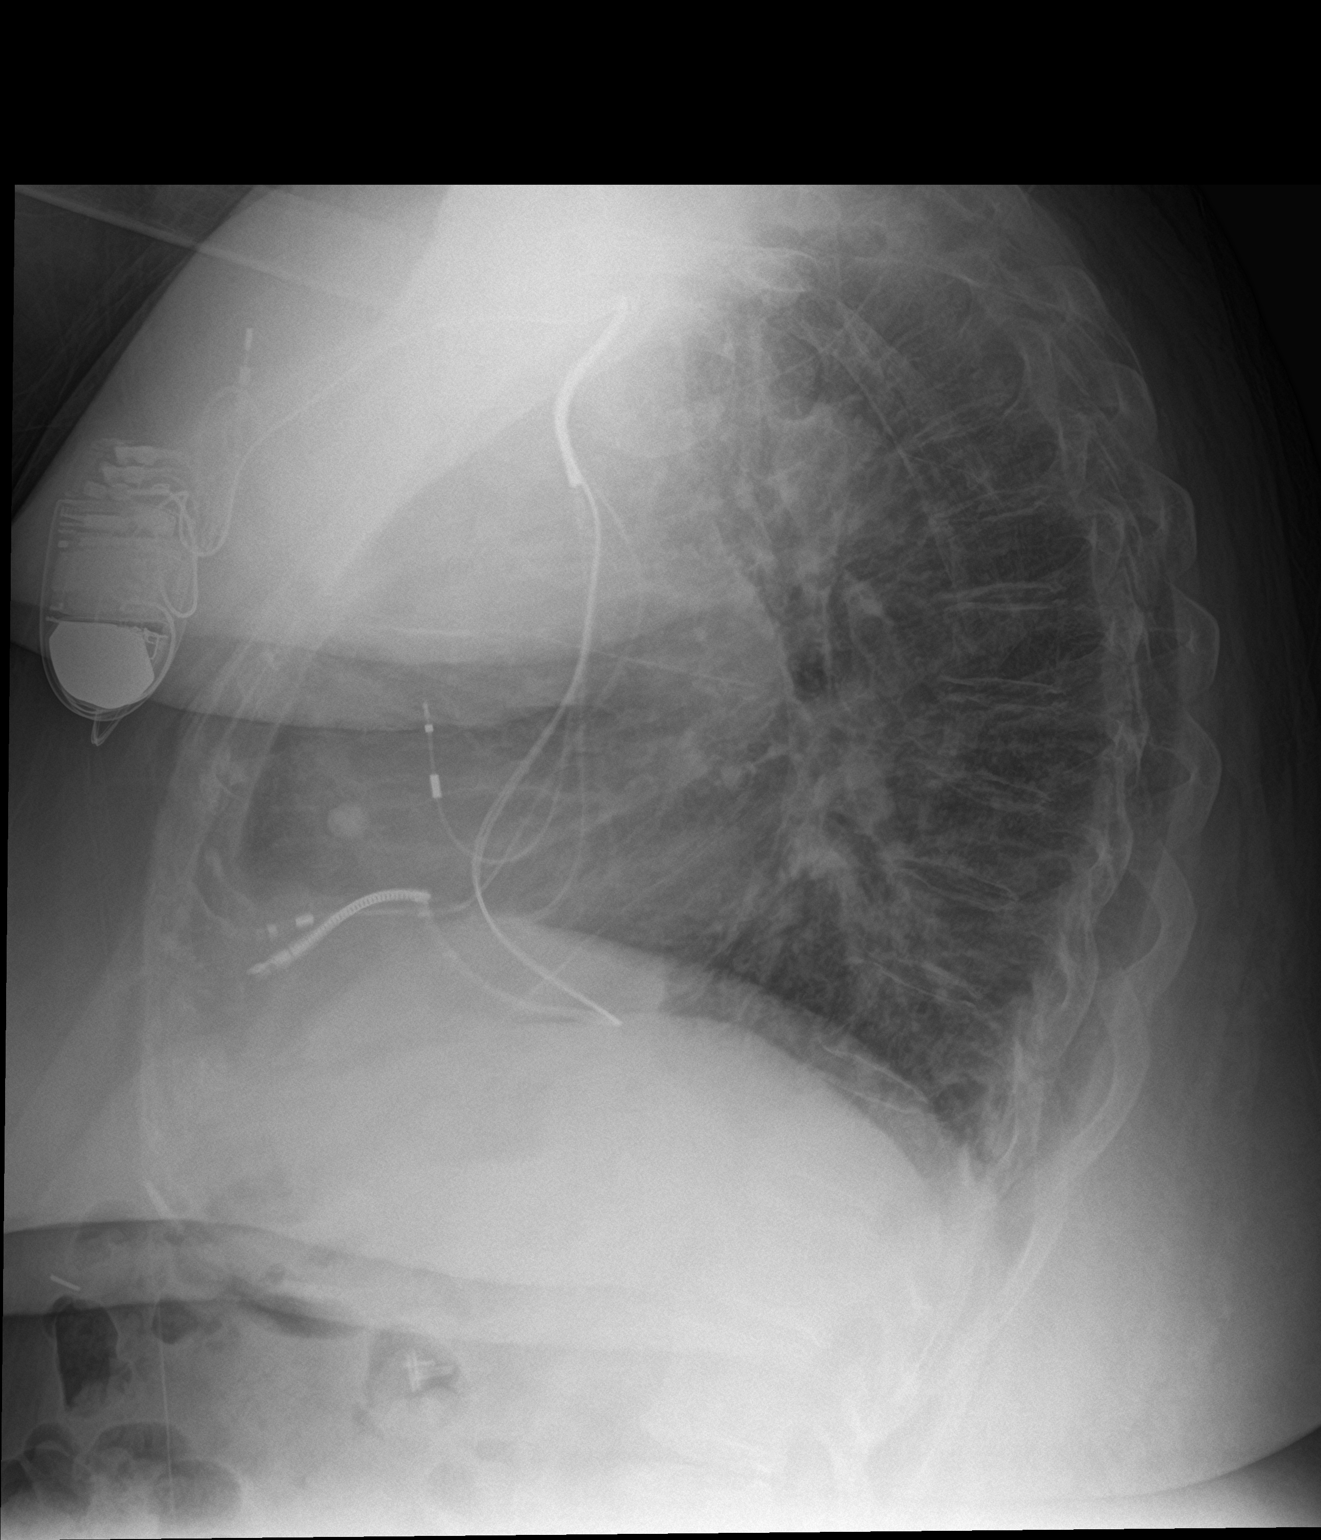

[2 of 2 positions shown; findings below may reference images not displayed]

FINDINGS: The lungs are well-expanded. The interstitial markings are coarse
though stable. The cardiac silhouette is mildly enlarged but also
stable. The pulmonary vascularity is not engorged. There is
calcification in the wall of the aortic arch. The ICD is in stable
position. There is no pleural effusion. There is mild multilevel
degenerative disc disease of the thoracic spine. A calcified nodule
projects in the right middle lobe and is stable.
IMPRESSION: Chronic bronchitic changes, stable. No acute pneumonia. Previous
granulomatous infection.

Mild cardiomegaly with prominence of the left atrial appendage. The
ICD is in stable position.

Thoracic aortic atherosclerosis.

## 2019-04-07 ENCOUNTER — Other Ambulatory Visit: Payer: Self-pay

## 2019-04-07 ENCOUNTER — Ambulatory Visit
Admission: RE | Admit: 2019-04-07 | Discharge: 2019-04-07 | Disposition: A | Discharge status: Home / Self Care | Payer: Medicare Other | Source: Ambulatory Visit | Attending: Internal Medicine | Admitting: Internal Medicine

## 2019-04-07 DIAGNOSIS — K5730 Diverticulosis of large intestine without perforation or abscess without bleeding: Secondary | ICD-10-CM | POA: Diagnosis not present

## 2019-04-07 DIAGNOSIS — M47816 Spondylosis without myelopathy or radiculopathy, lumbar region: Secondary | ICD-10-CM | POA: Insufficient documentation

## 2019-04-07 DIAGNOSIS — I700 Atherosclerosis of aorta: Secondary | ICD-10-CM | POA: Insufficient documentation

## 2019-04-07 DIAGNOSIS — M533 Sacrococcygeal disorders, not elsewhere classified: Secondary | ICD-10-CM | POA: Diagnosis not present

## 2019-04-07 LAB — POCT I-STAT CREATININE: Creatinine, Ser: 0.6 mg/dL (ref 0.44–1.00)

## 2019-04-07 MED ORDER — IOHEXOL 300 MG/ML  SOLN
125.0000 mL | Freq: Once | INTRAMUSCULAR | Status: AC | PRN
  Administered 2019-04-07: 125 mL via INTRAVENOUS

## 2019-07-22 ENCOUNTER — Encounter: Payer: Self-pay | Admitting: Emergency Medicine

## 2019-07-22 ENCOUNTER — Emergency Department: Payer: Medicare Other

## 2019-07-22 ENCOUNTER — Other Ambulatory Visit: Payer: Self-pay

## 2019-07-22 ENCOUNTER — Emergency Department
Admission: EM | Admit: 2019-07-22 | Discharge: 2019-07-22 | Disposition: A | Discharge status: Home / Self Care | Payer: Medicare Other | Attending: Emergency Medicine | Admitting: Emergency Medicine

## 2019-07-22 DIAGNOSIS — Z87891 Personal history of nicotine dependence: Secondary | ICD-10-CM | POA: Insufficient documentation

## 2019-07-22 DIAGNOSIS — Z7984 Long term (current) use of oral hypoglycemic drugs: Secondary | ICD-10-CM | POA: Diagnosis not present

## 2019-07-22 DIAGNOSIS — S80912A Unspecified superficial injury of left knee, initial encounter: Secondary | ICD-10-CM | POA: Diagnosis present

## 2019-07-22 DIAGNOSIS — I110 Hypertensive heart disease with heart failure: Secondary | ICD-10-CM | POA: Diagnosis not present

## 2019-07-22 DIAGNOSIS — Z7901 Long term (current) use of anticoagulants: Secondary | ICD-10-CM | POA: Diagnosis not present

## 2019-07-22 DIAGNOSIS — Y929 Unspecified place or not applicable: Secondary | ICD-10-CM | POA: Insufficient documentation

## 2019-07-22 DIAGNOSIS — R7981 Abnormal blood-gas level: Secondary | ICD-10-CM | POA: Diagnosis not present

## 2019-07-22 DIAGNOSIS — Y999 Unspecified external cause status: Secondary | ICD-10-CM | POA: Diagnosis not present

## 2019-07-22 DIAGNOSIS — Y939 Activity, unspecified: Secondary | ICD-10-CM | POA: Insufficient documentation

## 2019-07-22 DIAGNOSIS — W010XXA Fall on same level from slipping, tripping and stumbling without subsequent striking against object, initial encounter: Secondary | ICD-10-CM | POA: Insufficient documentation

## 2019-07-22 DIAGNOSIS — E119 Type 2 diabetes mellitus without complications: Secondary | ICD-10-CM | POA: Insufficient documentation

## 2019-07-22 DIAGNOSIS — S8002XA Contusion of left knee, initial encounter: Secondary | ICD-10-CM | POA: Diagnosis not present

## 2019-07-22 DIAGNOSIS — I509 Heart failure, unspecified: Secondary | ICD-10-CM | POA: Diagnosis not present

## 2019-07-22 DIAGNOSIS — Z95810 Presence of automatic (implantable) cardiac defibrillator: Secondary | ICD-10-CM | POA: Insufficient documentation

## 2019-07-22 DIAGNOSIS — Z79899 Other long term (current) drug therapy: Secondary | ICD-10-CM | POA: Diagnosis not present

## 2019-07-22 DIAGNOSIS — W19XXXA Unspecified fall, initial encounter: Secondary | ICD-10-CM

## 2019-07-22 MED ORDER — HYDROCODONE-ACETAMINOPHEN 5-325 MG PO TABS: 1.0000 | ORAL_TABLET | Freq: Four times a day (QID) | ORAL | 0 refills | Status: AC | PRN

## 2019-07-22 NOTE — ED Triage Notes (Signed)
Pt presents to ED via wheelchair from [facility] with c/o L knee pain, pt states feel yesterday while leaning over to pick something up. Pt c/o L knee pain at this time. Pt with noted bruising to L knee at this time.

## 2019-07-22 NOTE — Discharge Instructions (Signed)
Follow-up with your primary care provider if any continued problems.  Ice and elevate your knee as needed for swelling and to help with pain control.  A prescription for Norco was sent to your pharmacy if additional pain medication is needed.  This also contains Tylenol so do not take additional Tylenol when you are taking the Norco.  Use your walker for added support.  Your oxygen level in the ED was 93% each time it was checked which is consistent with when you had an annual physical.  No evidence of infection was noted on your chest x-ray.  Return to the emergency department over the weekend if any urgent concerns.

## 2019-07-22 NOTE — ED Provider Notes (Signed)
[facility] Emergency Department Provider Note  ____________________________________________   First MD Initiated Contact with Patient 07/22/19 1316     (approximate)  I have reviewed the triage vital signs and the nursing notes.   HISTORY  Chief Complaint Fall and Knee Pain   HPI [name] is a 79 y.o. female presents to the ED from [facility].  Patient states that she fell yesterday on her left knee.  She has continued to be ambulatory since that time.  There is  bruising and swelling in the area.  Patient had total knee replacement in the past.  Patient states that she was sent to the ED due to her hypotension at [facility].  Patient states that she suffers from vertigo.  She reports that this is what caused her to lose her balance and fall on her knee.  She denies any head injury or LOC.       Past Medical History:  Diagnosis Date  . AICD (automatic cardioverter/defibrillator) present    PLACED BY  DR  Graciela Husbands 2008  . Angina    5-6 YRS AGO  . Anxiety   . Arthritis   . Atrial fibrillation (HCC)   . CHF (congestive heart failure) (HCC)   . Complication of anesthesia    states at times had a tough time waking up  . Diabetes mellitus   . Diverticulum of esophagus    large diverticulum in the middle third of the esophagus [facility] EGD '09)  . Dysrhythmia    A-FIB  . Fibromyalgia   . Headache(784.0)   . Heartburn   . History of stomach ulcers   . History of transient ischemic attack (TIA)    LAST ONE IN  2005  IN  [place], [place]  . HLD (hyperlipidemia)   . Hypertension   . Shortness of breath    EASING OFF NOW  . Sleep apnea    DOESN'T KNOW SETTINGS  . Stroke (cerebrum) (HCC)   . TIA (transient ischemic attack)     Patient Active Problem List   Diagnosis Date Noted  . 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 Piriformis syndrome 06/18/2014  . Facet syndrome, lumbar 06/18/2014  . 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 DDD (degenerative  disc disease), lumbar 06/18/2014  . DDD (degenerative disc disease), cervical 06/08/2014  . 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 Cervical spinal stenosis 04/09/2011    Past Surgical History:  Procedure Laterality Date  . ANTERIOR CERVICAL DECOMP/DISCECTOMY FUSION  04/09/2011   Procedure: ANTERIOR CERVICAL DECOMPRESSION/DISCECTOMY FUSION 1 LEVEL;  Surgeon: Temple Pacini, MD;  Location: [facility];  Service: Neurosurgery;  Laterality: N/A;  Cervical five-six Anterior Cervical Decompression and Fusion  . APPENDECTOMY    . BILATERAL OOPHORECTOMY     BENIGN  . CARDIAC CATHETERIZATION     X 2    LAST ONE  2009  . CESAREAN SECTION     X  4  . CHOLECYSTECTOMY    . IMPLANTABLE CARDIOVERTER DEFIBRILLATOR (ICD) GENERATOR CHANGE Left 01/18/2019   Procedure: ICD GENERATOR CHANGE;  Surgeon: Marcina Millard, MD;  Location: [facility] ORS;  Service: Cardiovascular;  Laterality: Left;  . INSERT / REPLACE / REMOVE PACEMAKER     ST  JUDE  DEVICE  . JOINT REPLACEMENT     BIL  KNEES  . TONSILLECTOMY    . TUBAL LIGATION      Prior to Admission medications   Medication Sig Start  Date End Date Taking? Authorizing Provider  alendronate (FOSAMAX) 70 MG tablet Take 70 mg by mouth once a week. Monday 08/04/17   [provider]  B-D ULTRA-FINE 33 LANCETS MISC Use 1 each once daily. 05/18/14   [provider]  carvedilol (COREG) 12.5 MG tablet Take 12.5 mg by mouth 2 (two) times daily with a meal.    [provider]  Cholecalciferol (VITAMIN D-1000 MAX ST) 1000 units tablet Take 1,000 Units by mouth daily.     [provider]  Cyanocobalamin (RA VITAMIN B-12 TR) 1000 MCG TBCR Take 1,000 mcg by mouth daily at 12 noon.     [provider]  furosemide (LASIX) 20 MG tablet Take 20 mg by mouth daily as needed for fluid.  09/20/15 01/11/19  [provider]  gabapentin (NEURONTIN) 100 MG capsule Take 200 mg by mouth  3 (three) times daily.  11/30/13   [provider]  glucose blood (ONE TOUCH ULTRA TEST) test strip Use once daily. Use as instructed. 06/19/15   [provider]  hydrochlorothiazide (HYDRODIURIL) 25 MG tablet Take by mouth. 06/19/15 06/18/16  [provider]  HYDROcodone-acetaminophen (NORCO/VICODIN) 5-325 MG tablet Take 1 tablet by mouth every 6 (six) hours as needed for up to 5 days for moderate pain. 07/22/19 07/27/19  Tommi Rumps, PA-C  metFORMIN (GLUCOPHAGE) 500 MG tablet Take 500 mg by mouth 2 (two) times daily with a meal.    [provider]  Multiple Vitamins-Minerals (MULTIVITAMIN WITH MINERALS) tablet Take 1 tablet by mouth daily.    [provider]  omeprazole (PRILOSEC) 20 MG capsule Take 20 mg by mouth daily.    [provider]  pioglitazone (ACTOS) 15 MG tablet Take 15 mg by mouth daily.    [provider]  quinapril (ACCUPRIL) 40 MG tablet Take 40 mg by mouth daily.     [provider]  rosuvastatin (CRESTOR) 10 MG tablet Take 10 mg by mouth daily.    [provider]  sertraline (ZOLOFT) 100 MG tablet Take 100 mg by mouth daily.    [provider]  triamcinolone cream (KENALOG) 0.1 % Apply 1 application topically daily as needed (Wound).  01/06/17   [provider]  warfarin (COUMADIN) 2 MG tablet Take 2 mg by mouth daily at 12 noon. Do not take on Wednesday    [provider]  warfarin (COUMADIN) 2.5 MG tablet Take 2.5 mg by mouth daily.    [provider]    Allergies Ciprofloxacin hcl, Dopamine, Sulfa antibiotics, Sulfacetamide sodium, and Tegaderm ag mesh [silver]  Family History  Problem Relation Age of Onset  . Kidney disease Neg Hx   . Bladder Cancer Neg Hx     Social History Social History   Tobacco Use  . Smoking status: Former Smoker    Packs/day: 1.00    Years: 20.00    Pack years: 20.00    Types: Cigarettes    Quit date: 03/19/1991    Years  since quitting: 28.3  . Smokeless tobacco: Never Used  Vaping Use  . Vaping Use: Never used  Substance Use Topics  . Alcohol use: No  . Drug use: No    Review of Systems Constitutional: No fever/chills Eyes: No visual changes. ENT: No trauma. Cardiovascular: Denies chest pain. Respiratory: Denies shortness of breath. Gastrointestinal: No abdominal pain.  No nausea, no vomiting.  Musculoskeletal: Positive left knee pain. Skin: Negative for rash. Neurological: Negative for headaches, focal weakness or numbness.  ____________________________________________   PHYSICAL EXAM:  VITAL SIGNS: ED Triage Vitals [07/22/19 1216]  Enc Vitals Group     BP (!) 138/92     Pulse Rate 72     Resp 20     Temp 98.8 F (37.1 C)     Temp Source Oral     SpO2 93 %     Weight 244 lb (110.7 kg)     Height 5\' 3"  (1.6 m)     Head Circumference      Peak Flow      Pain Score 3     Pain Loc      Pain Edu?      Excl. in GC?     Constitutional: Alert and oriented. Well appearing and in no acute distress. Eyes: Conjunctivae are normal. PERRL. EOMI. Head: Atraumatic. Neck: No stridor.  Nontender cervical spine to palpation  posteriorly. Cardiovascular: Normal rate, regular rhythm. Grossly normal heart sounds.  Good peripheral circulation. Respiratory: Normal respiratory effort.  No retractions. Lungs CTAB. Gastrointestinal: Soft and nontender. No distention. Musculoskeletal: On examination of the left knee anteriorly there is moderate amount of ecchymosis and soft tissue edema.  There is a well-healed vertical scar from her previous total knee replacement.  No effusion is appreciated on palpation.  Patient is able to ambulate without any assistance but generally uses a walker when at home. Neurologic:  Normal speech and language. No gross focal neurologic deficits are appreciated. Skin:  Skin is warm, dry and intact.  Ecchymosis is noted above. Psychiatric: Mood and affect are normal. Speech and  behavior are normal.  ____________________________________________   LABS (all labs ordered are listed, but only abnormal results are displayed)  Labs Reviewed - No data to display ____________________________________________  EKG  Patient has a paced rhythm with a ventricular rate of 73.  Reviewed by major doctors. ____________________________________________  RADIOLOGY   Official radiology report(s): DG Chest 2 View  Result Date: 07/22/2019 CLINICAL DATA:  Hypoxemia. EXAM: CHEST - 2 VIEW COMPARISON:  Chest x-ray dated 12/17/2016 FINDINGS: Chronic mild cardiomegaly. Enlargement of the main pulmonary arteries is chronic and suggestive of pulmonary arterial hypertension. No infiltrates or effusions. No pulmonary edema. Calcified granuloma at the right lung base, unchanged. AICD in place. No acute bone abnormality. Slight accentuation of the thoracic kyphosis. IMPRESSION: No acute abnormality. Chronic mild cardiomegaly. Possible pulmonary arterial hypertension. Electronically Signed   By: 12/19/2016 M.D.   On: 07/22/2019 15:18   DG Knee Complete 4 Views Left  Result Date: 07/22/2019 CLINICAL DATA:  Fall.  Injury. EXAM: LEFT KNEE - COMPLETE 4+ VIEW COMPARISON:  No prior. FINDINGS: Total left knee replacement. Hardware intact. Anatomic alignment. No acute bony abnormality. No evidence of fracture or dislocation. IMPRESSION: Total left knee replacement. Hardware intact. Anatomic alignment. No acute abnormality. Electronically Signed   By: 03-01-1968  Register   On: 07/22/2019 13:29    ____________________________________________   PROCEDURES  Procedure(s) performed (including Critical Care):  Procedures   ____________________________________________   INITIAL IMPRESSION / ASSESSMENT AND PLAN / ED COURSE  As part of my medical decision making, I reviewed the following data within the electronic MEDICAL RECORD NUMBER Notes from prior ED visits and [place] Controlled Substance  Database  79 year old female is brought to the ED via [facility] wheelchair with complaint of left knee pain from falling on it yesterday.  Patient also states that provider became anxious that her blood pressure was too low and also that her O2 sat was low as  well.  Patient states that she has been told in the past that she has something "close to COPD".  Patient admits to being a former smoker at 1 pack cigarettes a day for the last 20 years.  She denies any shortness of breath or chest pain at this time.  Chest x-ray was negative for any acute changes.  Patient was made aware to follow-up with her PCP but looking through her chart especially for her most recent annual physical exam her O2 sat in the office on that day was 93.  There was several other recorded O2 sats that were in the same area which most likely is her baseline.  Patient was encouraged to ice and elevate her knee as needed for discomfort.  Also a prescription for pain medication was sent to her pharmacy should her pain worsen over the weekend otherwise she will take Tylenol that she has at home and use her walker for added support.  ____________________________________________   FINAL CLINICAL IMPRESSION(S) / ED DIAGNOSES  Final diagnoses:  Contusion of left knee, initial encounter  Borderline low oxygen saturation level  Fall, initial encounter     ED Discharge Orders         Ordered    HYDROcodone-acetaminophen (NORCO/VICODIN) 5-325 MG tablet  Every 6 hours PRN     Discontinue  Reprint     07/22/19 1538           Note:  This document was prepared using Dragon voice recognition software and may include unintentional dictation errors.    Johnn Hai, PA-C 07/22/19 1716    Duffy [name], MD 07/25/19 [phone]

## 2019-07-22 NOTE — ED Notes (Signed)
See triage note  Presents s/p fall   States she fell yesterday  Landed on left knee  Swelling and bruising noted

## 2019-07-29 ENCOUNTER — Inpatient Hospital Stay
Admission: EM | Admit: 2019-07-29 | Discharge: 2019-08-01 | DRG: 918 | Disposition: A | Discharge status: Home / Self Care | Payer: Medicare Other | Attending: Internal Medicine | Admitting: Internal Medicine

## 2019-07-29 ENCOUNTER — Encounter: Payer: Self-pay | Admitting: Emergency Medicine

## 2019-07-29 ENCOUNTER — Other Ambulatory Visit: Payer: Self-pay

## 2019-07-29 DIAGNOSIS — Z9049 Acquired absence of other specified parts of digestive tract: Secondary | ICD-10-CM

## 2019-07-29 DIAGNOSIS — I480 Paroxysmal atrial fibrillation: Secondary | ICD-10-CM | POA: Diagnosis present

## 2019-07-29 DIAGNOSIS — Z96653 Presence of artificial knee joint, bilateral: Secondary | ICD-10-CM | POA: Diagnosis present

## 2019-07-29 DIAGNOSIS — E785 Hyperlipidemia, unspecified: Secondary | ICD-10-CM | POA: Diagnosis present

## 2019-07-29 DIAGNOSIS — I110 Hypertensive heart disease with heart failure: Secondary | ICD-10-CM | POA: Diagnosis present

## 2019-07-29 DIAGNOSIS — Z90722 Acquired absence of ovaries, bilateral: Secondary | ICD-10-CM

## 2019-07-29 DIAGNOSIS — Z7901 Long term (current) use of anticoagulants: Secondary | ICD-10-CM

## 2019-07-29 DIAGNOSIS — T45511A Poisoning by anticoagulants, accidental (unintentional), initial encounter: Secondary | ICD-10-CM | POA: Diagnosis not present

## 2019-07-29 DIAGNOSIS — Z79899 Other long term (current) drug therapy: Secondary | ICD-10-CM

## 2019-07-29 DIAGNOSIS — I5022 Chronic systolic (congestive) heart failure: Secondary | ICD-10-CM | POA: Diagnosis present

## 2019-07-29 DIAGNOSIS — Z87891 Personal history of nicotine dependence: Secondary | ICD-10-CM

## 2019-07-29 DIAGNOSIS — Z8673 Personal history of transient ischemic attack (TIA), and cerebral infarction without residual deficits: Secondary | ICD-10-CM

## 2019-07-29 DIAGNOSIS — Y92009 Unspecified place in unspecified non-institutional (private) residence as the place of occurrence of the external cause: Secondary | ICD-10-CM

## 2019-07-29 DIAGNOSIS — Z882 Allergy status to sulfonamides status: Secondary | ICD-10-CM

## 2019-07-29 DIAGNOSIS — Z7984 Long term (current) use of oral hypoglycemic drugs: Secondary | ICD-10-CM

## 2019-07-29 DIAGNOSIS — Z8711 Personal history of peptic ulcer disease: Secondary | ICD-10-CM

## 2019-07-29 DIAGNOSIS — Z981 Arthrodesis status: Secondary | ICD-10-CM

## 2019-07-29 DIAGNOSIS — E119 Type 2 diabetes mellitus without complications: Secondary | ICD-10-CM | POA: Diagnosis present

## 2019-07-29 DIAGNOSIS — Z7983 Long term (current) use of bisphosphonates: Secondary | ICD-10-CM

## 2019-07-29 DIAGNOSIS — Z95810 Presence of automatic (implantable) cardiac defibrillator: Secondary | ICD-10-CM

## 2019-07-29 DIAGNOSIS — G4730 Sleep apnea, unspecified: Secondary | ICD-10-CM | POA: Diagnosis present

## 2019-07-29 DIAGNOSIS — Z20822 Contact with and (suspected) exposure to covid-19: Secondary | ICD-10-CM | POA: Diagnosis present

## 2019-07-29 DIAGNOSIS — K219 Gastro-esophageal reflux disease without esophagitis: Secondary | ICD-10-CM | POA: Diagnosis present

## 2019-07-29 DIAGNOSIS — M797 Fibromyalgia: Secondary | ICD-10-CM | POA: Diagnosis present

## 2019-07-29 DIAGNOSIS — Z888 Allergy status to other drugs, medicaments and biological substances status: Secondary | ICD-10-CM

## 2019-07-29 LAB — CBC WITH DIFFERENTIAL/PLATELET
Abs Immature Granulocytes: 0.03 10*3/uL (ref 0.00–0.07)
Basophils Absolute: 0 10*3/uL (ref 0.0–0.1)
Basophils Relative: 0 %
Eosinophils Absolute: 0.1 10*3/uL (ref 0.0–0.5)
Eosinophils Relative: 2 %
HCT: 34.4 % — ABNORMAL LOW (ref 36.0–46.0)
Hemoglobin: 10.7 g/dL — ABNORMAL LOW (ref 12.0–15.0)
Immature Granulocytes: 0 %
Lymphocytes Relative: 18 %
Lymphs Abs: 1.3 10*3/uL (ref 0.7–4.0)
MCH: 29.2 pg (ref 26.0–34.0)
MCHC: 31.1 g/dL (ref 30.0–36.0)
MCV: 94 fL (ref 80.0–100.0)
Monocytes Absolute: 0.6 10*3/uL (ref 0.1–1.0)
Monocytes Relative: 8 %
Neutro Abs: 5.2 10*3/uL (ref 1.7–7.7)
Neutrophils Relative %: 72 %
Platelets: 195 10*3/uL (ref 150–400)
RBC: 3.66 MIL/uL — ABNORMAL LOW (ref 3.87–5.11)
RDW: 14.9 % (ref 11.5–15.5)
WBC: 7.2 10*3/uL (ref 4.0–10.5)
nRBC: 0 % (ref 0.0–0.2)

## 2019-07-29 LAB — COMPREHENSIVE METABOLIC PANEL
ALT: 8 U/L (ref 0–44)
AST: 19 U/L (ref 15–41)
Albumin: 3.4 g/dL — ABNORMAL LOW (ref 3.5–5.0)
Alkaline Phosphatase: 46 U/L (ref 38–126)
Anion gap: 9 (ref 5–15)
BUN: 13 mg/dL (ref 8–23)
CO2: 29 mmol/L (ref 22–32)
Calcium: 8.7 mg/dL — ABNORMAL LOW (ref 8.9–10.3)
Chloride: 101 mmol/L (ref 98–111)
Creatinine, Ser: 0.67 mg/dL (ref 0.44–1.00)
GFR calc Af Amer: >60 mL/min (ref >60)
GFR calc non Af Amer: >60 mL/min (ref >60)
Glucose, Bld: 130 mg/dL — ABNORMAL HIGH (ref 70–99)
Potassium: 4.2 mmol/L (ref 3.5–5.1)
Sodium: 139 mmol/L (ref 135–145)
Total Bilirubin: 1.3 mg/dL — ABNORMAL HIGH (ref 0.3–1.2)
Total Protein: 6.9 g/dL (ref 6.5–8.1)

## 2019-07-29 LAB — PROTIME-INR
INR: 1.4 — ABNORMAL HIGH (ref 0.8–1.2)
INR: 1.5 — ABNORMAL HIGH (ref 0.8–1.2)
Prothrombin Time: 17 seconds — ABNORMAL HIGH (ref 11.4–15.2)
Prothrombin Time: 17.7 seconds — ABNORMAL HIGH (ref 11.4–15.2)

## 2019-07-29 LAB — GLUCOSE, CAPILLARY
Glucose-Capillary: 159 mg/dL — ABNORMAL HIGH (ref 70–99)
Glucose-Capillary: 117 mg/dL — ABNORMAL HIGH (ref 70–99)

## 2019-07-29 LAB — TYPE AND SCREEN
ABO/RH(D): A POS
Antibody Screen: NEGATIVE

## 2019-07-29 LAB — SARS CORONAVIRUS 2 BY RT PCR (HOSPITAL ORDER, PERFORMED IN ~~LOC~~ HOSPITAL LAB): SARS Coronavirus 2: NEGATIVE

## 2019-07-29 MED ORDER — GABAPENTIN 100 MG PO CAPS
200.0000 mg | ORAL_CAPSULE | Freq: Three times a day (TID) | ORAL | Status: DC
  Administered 2019-07-29 – 2019-08-01 (×8): 200 mg via ORAL
  Filled 2019-07-29 (×8): qty 2

## 2019-07-29 MED ORDER — CARVEDILOL 12.5 MG PO TABS
12.5000 mg | ORAL_TABLET | Freq: Two times a day (BID) | ORAL | Status: DC
  Administered 2019-07-29 – 2019-08-01 (×4): 12.5 mg via ORAL
  Filled 2019-07-29: qty 1
  Filled 2019-07-29: qty 4
  Filled 2019-07-29: qty 1
  Filled 2019-07-29 (×3): qty 4
  Filled 2019-07-29: qty 1

## 2019-07-29 MED ORDER — NORTRIPTYLINE HCL 10 MG PO CAPS
20.0000 mg | ORAL_CAPSULE | Freq: Every day | ORAL | Status: DC
  Administered 2019-07-30: 20 mg via ORAL
  Administered 2019-07-30: 10 mg via ORAL
  Administered 2019-07-31: 21:00:00 20 mg via ORAL
  Filled 2019-07-29 (×4): qty 2

## 2019-07-29 MED ORDER — SERTRALINE HCL 50 MG PO TABS
100.0000 mg | ORAL_TABLET | Freq: Every day | ORAL | Status: DC
  Administered 2019-07-30 – 2019-08-01 (×3): 100 mg via ORAL
  Filled 2019-07-29 (×4): qty 2

## 2019-07-29 MED ORDER — VITAMIN B-12 1000 MCG PO TABS
1000.0000 ug | ORAL_TABLET | Freq: Every day | ORAL | Status: DC
  Administered 2019-07-30 – 2019-08-01 (×3): 1000 ug via ORAL
  Filled 2019-07-29 (×3): qty 1

## 2019-07-29 MED ORDER — ACETAMINOPHEN 325 MG PO TABS
650.0000 mg | ORAL_TABLET | Freq: Four times a day (QID) | ORAL | Status: DC | PRN
  Administered 2019-07-30: 03:00:00 650 mg via ORAL
  Filled 2019-07-29: qty 2

## 2019-07-29 MED ORDER — ADULT MULTIVITAMIN W/MINERALS CH
1.0000 | ORAL_TABLET | Freq: Every day | ORAL | Status: DC
  Administered 2019-07-30 – 2019-08-01 (×3): 1 via ORAL
  Filled 2019-07-29 (×3): qty 1

## 2019-07-29 MED ORDER — ALENDRONATE SODIUM 70 MG PO TABS: 70.0000 mg | ORAL_TABLET | ORAL | Status: DC

## 2019-07-29 MED ORDER — INSULIN ASPART 100 UNIT/ML ~~LOC~~ SOLN: 0.0000 [IU] | Freq: Every day | SUBCUTANEOUS | Status: DC

## 2019-07-29 MED ORDER — ROSUVASTATIN CALCIUM 10 MG PO TABS
10.0000 mg | ORAL_TABLET | Freq: Every day | ORAL | Status: DC
  Administered 2019-07-30 – 2019-08-01 (×3): 10 mg via ORAL
  Filled 2019-07-29 (×3): qty 1

## 2019-07-29 MED ORDER — INSULIN ASPART 100 UNIT/ML ~~LOC~~ SOLN: 0.0000 [IU] | Freq: Three times a day (TID) | SUBCUTANEOUS | Status: DC

## 2019-07-29 MED ORDER — FUROSEMIDE 40 MG PO TABS
40.0000 mg | ORAL_TABLET | Freq: Every day | ORAL | Status: DC
  Administered 2019-07-30: 08:00:00 40 mg via ORAL
  Filled 2019-07-29 (×3): qty 1

## 2019-07-29 MED ORDER — QUINAPRIL HCL 10 MG PO TABS
40.0000 mg | ORAL_TABLET | Freq: Every day | ORAL | Status: DC
  Administered 2019-07-30: 40 mg via ORAL
  Filled 2019-07-29 (×2): qty 4

## 2019-07-29 MED ORDER — PANTOPRAZOLE SODIUM 40 MG PO TBEC
40.0000 mg | DELAYED_RELEASE_TABLET | Freq: Every day | ORAL | Status: DC
  Administered 2019-07-30 – 2019-08-01 (×3): 40 mg via ORAL
  Filled 2019-07-29 (×3): qty 1

## 2019-07-29 MED ORDER — ACETAMINOPHEN 650 MG RE SUPP: 650.0000 mg | Freq: Four times a day (QID) | RECTAL | Status: DC | PRN

## 2019-07-29 MED ORDER — CHARCOAL ACTIVATED PO LIQD
50.0000 g | Freq: Once | ORAL | Status: AC
  Administered 2019-07-29: 50 g via ORAL
  Filled 2019-07-29: qty 240

## 2019-07-29 MED ORDER — VITAMIN D 25 MCG (1000 UNIT) PO TABS
1000.0000 [IU] | ORAL_TABLET | Freq: Every day | ORAL | Status: DC
  Administered 2019-07-30 – 2019-08-01 (×3): 1000 [IU] via ORAL
  Filled 2019-07-29 (×3): qty 1

## 2019-07-29 NOTE — ED Notes (Signed)
Pt given turkey sandwich tray 

## 2019-07-29 NOTE — ED Provider Notes (Signed)
[facility] Emergency Department Provider Note ____________________________________________   First MD Initiated Contact with Patient 07/29/19 1444     (approximate)  I have reviewed the triage vital signs and the nursing notes.   HISTORY  Chief Complaint Drug Overdose    HPI [name] is a 79 y.o. female with PMH as noted below who presents after accidental warfarin overdose, acute onset around 1:15 PM.  The patient states that she normally takes her daily pills out of her pill case and into an empty pill bottle to then put them in her mouth.  She actually took her pills and put them with a bottle that already had 21 tablets of 2 mg of Coumadin in it, and took all this at once.  She adamantly denies any intention for self-harm and has no SI.  She has not taken any of her other medications incorrectly today.  She immediately realized what it happened and called 911.  The patient denies any acute symptoms at this time.  Past Medical History:  Diagnosis Date  . AICD (automatic cardioverter/defibrillator) present    PLACED BY  DR  Graciela Husbands 2008  . Angina    5-6 YRS AGO  . Anxiety   . Arthritis   . Atrial fibrillation (HCC)   . CHF (congestive heart failure) (HCC)   . Complication of anesthesia    states at times had a tough time waking up  . Diabetes mellitus   . Diverticulum of esophagus    large diverticulum in the middle third of the esophagus [facility] EGD '09)  . Dysrhythmia    A-FIB  . Fibromyalgia   . Headache(784.0)   . Heartburn   . History of stomach ulcers   . History of transient ischemic attack (TIA)    LAST ONE IN  2005  IN  [place], [place]  . HLD (hyperlipidemia)   . Hypertension   . Shortness of breath    EASING OFF NOW  . Sleep apnea    DOESN'T KNOW SETTINGS  . Stroke (cerebrum) (HCC)   . TIA (transient ischemic attack)     Patient Active Problem List   Diagnosis Date Noted  . Overdose of coumadin, accidental or unintentional,  initial encounter 07/29/2019  . 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 Piriformis syndrome 06/18/2014  . Facet syndrome, lumbar 06/18/2014  . 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 DDD (degenerative disc disease), lumbar 06/18/2014  . DDD (degenerative disc disease), cervical 06/08/2014  . 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 Cervical spinal stenosis 04/09/2011    Past Surgical History:  Procedure Laterality Date  . ANTERIOR CERVICAL DECOMP/DISCECTOMY FUSION  04/09/2011   Procedure: ANTERIOR CERVICAL DECOMPRESSION/DISCECTOMY FUSION 1 LEVEL;  Surgeon: Temple Pacini, MD;  Location: [facility];  Service: Neurosurgery;  Laterality: N/A;  Cervical five-six Anterior Cervical Decompression and Fusion  . APPENDECTOMY    . BILATERAL OOPHORECTOMY     BENIGN  . CARDIAC CATHETERIZATION     X 2    LAST ONE  2009  . CESAREAN SECTION     X  4  . CHOLECYSTECTOMY    . IMPLANTABLE CARDIOVERTER DEFIBRILLATOR (ICD) GENERATOR CHANGE Left 01/18/2019   Procedure: ICD GENERATOR CHANGE;  Surgeon: Marcina Millard, MD;  Location: [facility] ORS;  Service: Cardiovascular;  Laterality: Left;  . INSERT / REPLACE / REMOVE PACEMAKER     ST  JUDE  DEVICE  . JOINT REPLACEMENT     BIL  KNEES  . TONSILLECTOMY    . TUBAL LIGATION      Prior to Admission medications   Medication Sig Start Date End Date Taking? Authorizing Provider  alendronate (FOSAMAX) 70 MG tablet Take 70 mg by mouth once a week. Monday 08/04/17   [provider]  B-D ULTRA-FINE 33 LANCETS MISC Use 1 each once daily. 05/18/14   [provider]  carvedilol (COREG) 12.5 MG tablet Take 12.5 mg by mouth 2 (two) times daily with a meal.    [provider]  Cholecalciferol (VITAMIN D-1000 MAX ST) 1000 units tablet Take 1,000 Units by mouth daily.     [provider]  Cyanocobalamin (RA VITAMIN B-12 TR) 1000 MCG TBCR Take 1,000 mcg by mouth daily at 12 noon.     [provider]  furosemide (LASIX) 20 MG tablet Take 20 mg by mouth daily as needed for fluid.  09/20/15 01/11/19  [provider]  gabapentin (NEURONTIN) 100 MG capsule Take 200 mg by mouth 3 (three) times daily.  11/30/13   [provider]  glucose blood (ONE TOUCH ULTRA TEST) test strip Use once daily. Use as instructed. 06/19/15   [provider]  hydrochlorothiazide (HYDRODIURIL) 25 MG tablet Take by mouth. 06/19/15 06/18/16  [provider]  metFORMIN (GLUCOPHAGE) 500 MG tablet Take 500 mg by mouth 2 (two) times daily with a meal.    [provider]  Multiple Vitamins-Minerals (MULTIVITAMIN WITH MINERALS) tablet Take 1 tablet by mouth daily.    [provider]  omeprazole (PRILOSEC) 20 MG capsule Take 20 mg by mouth daily.    [provider]  pioglitazone (ACTOS) 15 MG tablet Take 15 mg by mouth daily.    [provider]  quinapril (ACCUPRIL) 40 MG tablet Take 40 mg by mouth daily.     [provider]  rosuvastatin (CRESTOR) 10 MG tablet Take 10 mg by mouth daily.    [provider]  sertraline (ZOLOFT) 100 MG tablet Take 100 mg by mouth daily.    [provider]  triamcinolone cream (KENALOG) 0.1 % Apply 1 application topically daily as needed (Wound).  01/06/17   [provider]  warfarin (COUMADIN) 2 MG tablet Take 2 mg by mouth daily at 12 noon. Do not take on Wednesday    [provider]  warfarin (COUMADIN) 2.5 MG tablet Take 2.5 mg by mouth daily.    [provider]    Allergies Ciprofloxacin hcl, Dopamine, Sulfa antibiotics, Sulfacetamide sodium, and Tegaderm ag mesh [silver]  Family History  Problem Relation Age of Onset  . Kidney disease Neg Hx   . Bladder Cancer Neg Hx     Social History Social History   Tobacco Use  . Smoking status: Former Smoker    Packs/day: 1.00    Years: 20.00    Pack years: 20.00    Types: Cigarettes    Quit date: 03/19/1991     Years since quitting: 28.3  . Smokeless tobacco: Never Used  Vaping Use  . Vaping Use: Never used  Substance Use Topics  . Alcohol use: No  . Drug use: No    Review of Systems  Constitutional: No fever/chills. Eyes: No visual changes. ENT: No sore throat. Cardiovascular: Denies chest pain. Respiratory: Denies shortness of breath. Gastrointestinal: No nausea, no vomiting.  No diarrhea.  Genitourinary: Negative for dysuria or hematuria.  Musculoskeletal: Negative for back pain. Skin: Negative for rash. Neurological: Negative for headaches, focal weakness or numbness.  ____________________________________________   PHYSICAL EXAM:  VITAL SIGNS: ED Triage Vitals  Enc Vitals Group     BP 07/29/19 1423 119/66     Pulse Rate 07/29/19 1423 70     Resp 07/29/19 1423 19     Temp 07/29/19 1423 99.2 F (37.3 C)     Temp Source 07/29/19 1423 Oral     SpO2 07/29/19 1419 92 %     Weight 07/29/19 1425 244 lb (110.7 kg)     Height 07/29/19 1425 5\' 3"  (1.6 m)     Head Circumference --      Peak Flow --      Pain Score 07/29/19 1425 0     Pain Loc --      Pain Edu? --      Excl. in GC? --     Constitutional: Alert and oriented.  Relatively well appearing and in no acute distress. Eyes: Conjunctivae are normal.  Head: Atraumatic. Nose: No congestion/rhinnorhea. Mouth/Throat: Mucous membranes are moist.   Neck: Normal range of motion.  Cardiovascular: Normal rate, regular rhythm. Good peripheral circulation. Respiratory: Normal respiratory effort.  No retractions.  Gastrointestinal: No distention.  Musculoskeletal: Extremities warm and well perfused.  Neurologic:  Normal speech and language. No gross focal neurologic deficits are appreciated.  Skin:  Skin is warm and dry. No rash noted. Psychiatric: Mood and affect are normal. Speech and behavior are normal.  ____________________________________________   LABS (all labs ordered are listed, but only abnormal results are  displayed)  Labs Reviewed  COMPREHENSIVE METABOLIC PANEL - Abnormal; Notable for the following components:      Result Value   Glucose, Bld 130 (*)    Calcium 8.7 (*)    Albumin 3.4 (*)    Total Bilirubin 1.3 (*)    All other components within normal limits  CBC WITH DIFFERENTIAL/PLATELET - Abnormal; Notable for the following components:   RBC 3.66 (*)    Hemoglobin 10.7 (*)    HCT 34.4 (*)    All other components within normal limits  PROTIME-INR - Abnormal; Notable for the following components:   Prothrombin Time 17.0 (*)    INR 1.4 (*)    All other components within normal limits  SARS CORONAVIRUS 2 BY RT PCR (HOSPITAL ORDER, PERFORMED IN [facility])  TYPE AND SCREEN   ____________________________________________  EKG   ____________________________________________  RADIOLOGY    ____________________________________________   PROCEDURES  Procedure(s) performed: No  Procedures  Critical Care performed: No ____________________________________________   INITIAL IMPRESSION / ASSESSMENT AND PLAN / ED COURSE  Pertinent labs & imaging results that were available during my care of the patient were reviewed by me and considered in my medical decision making (see chart for details).  79 year old female with history of atrial fibrillation on warfarin presents with an accidental overdose of likely 42 mg at 1:15 PM.  The patient denies SI or any intent for self-harm.  She denies any acute symptoms at this time.  On exam, the patient is relatively comfortable appearing.  O2 saturation is borderline low which appears to be the patient's baseline.  Physical exam is otherwise unremarkable for acute findings.  We immediately started p.o. charcoal given the short duration since she took the pills.  I then consulted the poison control center.  I discussed the patient's case with provider 73 there who recommended a repeat PT/INR at 4 hours, and then daily INR for  the next 5 days.  There is no recommendation for preemptive  intervention or reversal.  We will continue to monitor the patient closely.  ----------------------------------------- 3:30 PM on 07/29/2019 -----------------------------------------  The patient continues to be asymptomatic.  Initial INR is 1.4.  She will need admission for further monitoring and repeat INR levels.  I discussed her case with the hospitalist Dr. Jerral Ralph.   ____________________________________________   FINAL CLINICAL IMPRESSION(S) / ED DIAGNOSES  Final diagnoses:  Warfarin overdosage, accidental or unintentional, initial encounter      NEW MEDICATIONS STARTED DURING THIS VISIT:  New Prescriptions   No medications on file     Note:  This document was prepared using Dragon voice recognition software and may include unintentional dictation errors.    Dionne Bucy, MD 07/29/19 1530

## 2019-07-29 NOTE — H&P (Signed)
History and Physical    [name] JJK:[id number] DOB: December 15, 1940 DOA: 07/29/2019  PCP: Gracelyn Nurse, MD  Patient coming from: Home  I have personally briefly reviewed patient's old medical records available.   Chief Complaint: Accidentally took 21 tablets of Coumadin.  HPI: [name] is a 79 y.o. female with medical history significant of type 2 diabetes on Metformin, paroxysmal atrial fibrillation on beta-blockers and Coumadin, hypertension, anxiety, GERD, chronic systolic failure status post AICD presents to the emergency room after accidental warfarin overdose.  Patient usually pulls out all of her medications which are about 14 of them and puts it in a canister and takes all at once in the afternoon.  Today, she did put all her medications into a bottle which she thought is empty, she took all of them and after finishing them she realized that it was a Coumadin bottle where she had 21 days worth of Coumadin 2 mg each tablets.  No suicidal ideations.  She immediately realized that and called 911.  Patient denied any complaints.  She was just worried. ED Course: Hemodynamically stable.  INR is 1.4. Poison control was contacted, she was given a dose of oral activated charcoal and advised monitoring in the hospital.  Review of Systems: all systems are reviewed and pertinent positive as per HPI otherwise rest are negative.    Past Medical History:  Diagnosis Date  . AICD (automatic cardioverter/defibrillator) present    PLACED BY  DR  Graciela Husbands 2008  . Angina    5-6 YRS AGO  . Anxiety   . Arthritis   . Atrial fibrillation (HCC)   . CHF (congestive heart failure) (HCC)   . Complication of anesthesia    states at times had a tough time waking up  . Diabetes mellitus   . Diverticulum of esophagus    large diverticulum in the middle third of the esophagus [facility] EGD '09)  . Dysrhythmia    A-FIB  . Fibromyalgia   . Headache(784.0)   . Heartburn   . History of stomach ulcers    . History of transient ischemic attack (TIA)    LAST ONE IN  2005  IN  [place], [place]  . HLD (hyperlipidemia)   . Hypertension   . Shortness of breath    EASING OFF NOW  . Sleep apnea    DOESN'T KNOW SETTINGS  . Stroke (cerebrum) (HCC)   . TIA (transient ischemic attack)     Past Surgical History:  Procedure Laterality Date  . ANTERIOR CERVICAL DECOMP/DISCECTOMY FUSION  04/09/2011   Procedure: ANTERIOR CERVICAL DECOMPRESSION/DISCECTOMY FUSION 1 LEVEL;  Surgeon: Temple Pacini, MD;  Location: [facility];  Service: Neurosurgery;  Laterality: N/A;  Cervical five-six Anterior Cervical Decompression and Fusion  . APPENDECTOMY    . BILATERAL OOPHORECTOMY     BENIGN  . CARDIAC CATHETERIZATION     X 2    LAST ONE  2009  . CESAREAN SECTION     X  4  . CHOLECYSTECTOMY    . IMPLANTABLE CARDIOVERTER DEFIBRILLATOR (ICD) GENERATOR CHANGE Left 01/18/2019   Procedure: ICD GENERATOR CHANGE;  Surgeon: Marcina Millard, MD;  Location: [facility] ORS;  Service: Cardiovascular;  Laterality: Left;  . INSERT / REPLACE / REMOVE PACEMAKER     ST  JUDE  DEVICE  . JOINT REPLACEMENT     BIL  KNEES  . TONSILLECTOMY    . TUBAL LIGATION       reports that she quit smoking about  28 years ago. Her smoking use included cigarettes. She has a 20.00 pack-year smoking history. She has never used smokeless tobacco. She reports that she does not drink alcohol and does not use drugs.  Allergies  Allergen Reactions  . Ciprofloxacin Hcl Itching    Rash and itching all over stomach and arms  . Dopamine Nausea And Vomiting  . Sulfa Antibiotics Rash  . Sulfacetamide Sodium Rash  . Tegaderm Ag Mesh [Silver] Other (See Comments) and Rash    Blisters and takes skin off    Family History  Problem Relation Age of Onset  . Kidney disease Neg Hx   . Bladder Cancer Neg Hx      Prior to Admission medications   Medication Sig Start Date End Date Taking? Authorizing Provider  alendronate (FOSAMAX) 70 MG tablet Take 70 mg  by mouth every Monday.    Yes [provider]  carvedilol (COREG) 12.5 MG tablet Take 12.5 mg by mouth 2 (two) times daily with a meal.   Yes [provider]  Cholecalciferol (VITAMIN D-1000 MAX ST) 1000 units tablet Take 1,000 Units by mouth daily.    Yes [provider]  Cyanocobalamin (RA VITAMIN B-12 TR) 1000 MCG TBCR Take 1,000 mcg by mouth daily.    Yes [provider]  furosemide (LASIX) 20 MG tablet Take 40 mg by mouth daily.    Yes [provider]  gabapentin (NEURONTIN) 100 MG capsule Take 200 mg by mouth 3 (three) times daily.  11/30/13  Yes [provider]  metFORMIN (GLUCOPHAGE) 500 MG tablet Take 500 mg by mouth 2 (two) times daily with a meal.   Yes [provider]  Multiple Vitamins-Minerals (MULTIVITAMIN WITH MINERALS) tablet Take 1 tablet by mouth daily.   Yes [provider]  nortriptyline (PAMELOR) 10 MG capsule Take 10-20 mg by mouth See admin instructions. Take 1 capsule (10mg ) by mouth nightly for 1 week then take 2 capsules (20mg ) by mouth nightly 07/27/19  Yes [provider]  omeprazole (PRILOSEC) 20 MG capsule Take 20 mg by mouth daily.   Yes [provider]  pioglitazone (ACTOS) 15 MG tablet Take 15 mg by mouth daily.   Yes [provider]  quinapril (ACCUPRIL) 40 MG tablet Take 40 mg by mouth daily.    Yes [provider]  rosuvastatin (CRESTOR) 10 MG tablet Take 10 mg by mouth daily.   Yes [provider]  sertraline (ZOLOFT) 100 MG tablet Take 100 mg by mouth daily.   Yes [provider]  triamcinolone cream (KENALOG) 0.1 % Apply 1 application topically daily as needed (Wound).  01/06/17  Yes [provider]  warfarin (COUMADIN) 2 MG tablet Take 2 mg by mouth See admin instructions. Take 1 tablet (2mg ) daily on Monday, Tuesday, Thursday, Friday, Saturday and Sunday   Yes [provider]  warfarin (COUMADIN) 2.5 MG tablet Take 2.5  mg by mouth every Wednesday.    Yes [provider]    Physical Exam: Vitals:   07/29/19 1419 07/29/19 1423 07/29/19 1425 07/29/19 1600  BP:  119/66  119/79  Pulse:  70    Resp:  19  (!) 24  Temp:  99.2 F (37.3 C)    TempSrc:  Oral    SpO2: 92% 91%    Weight:   110.7 kg   Height:   5\' 3"  (1.6 m)     Constitutional: NAD, calm, comfortable Vitals:   07/29/19 1419 07/29/19 1423 07/29/19 1425 07/29/19  1600  BP:  119/66  119/79  Pulse:  70    Resp:  19  (!) 24  Temp:  99.2 F (37.3 C)    TempSrc:  Oral    SpO2: 92% 91%    Weight:   110.7 kg   Height:   5\' 3"  (1.6 m)    Eyes: PERRL, lids and conjunctivae normal ENMT: Mucous membranes are moist. Posterior pharynx clear of any exudate or lesions.Normal dentition.  Neck: normal, supple, no masses, no thyromegaly Respiratory: clear to auscultation bilaterally, no wheezing, no crackles. Normal respiratory effort. No accessory muscle use.  Cardiovascular: Regular rate and rhythm, no murmurs / rubs / gallops.  AICD present left precordium. Abdomen: no tenderness, no masses palpated. No hepatosplenomegaly. Bowel sounds positive.  Musculoskeletal: no clubbing / cyanosis. No joint deformity upper and lower extremities. Good ROM, no contractures. Normal muscle tone. Patient has ecchymosis and small hematoma on the left anterior knee with recent fall. Skin: no rashes, lesions, ulcers. No induration Neurologic: CN 2-12 grossly intact. Sensation intact, DTR normal. Strength 5/5 in all 4.  Psychiatric: Normal judgment and insight. Alert and oriented x 3. Normal mood.     Labs on Admission: I have personally reviewed following labs and imaging studies  CBC: Recent Labs  Lab 07/29/19 1422  WBC 7.2  NEUTROABS 5.2  HGB 10.7*  HCT 34.4*  MCV 94.0  PLT 195   Basic Metabolic Panel: Recent Labs  Lab 07/29/19 1422  NA 139  K 4.2  CL 101  CO2 29  GLUCOSE 130*  BUN 13  CREATININE 0.67  CALCIUM 8.7*   GFR: Estimated  Creatinine Clearance: 71.5 mL/min (by C-G formula based on SCr of 0.67 mg/dL). Liver Function Tests: Recent Labs  Lab 07/29/19 1422  AST 19  ALT 8  ALKPHOS 46  BILITOT 1.3*  PROT 6.9  ALBUMIN 3.4*   No results for input(s): LIPASE, AMYLASE in the last 168 hours. No results for input(s): AMMONIA in the last 168 hours. Coagulation Profile: Recent Labs  Lab 07/29/19 1422  INR 1.4*   Cardiac Enzymes: No results for input(s): CKTOTAL, CKMB, CKMBINDEX, TROPONINI in the last 168 hours. BNP (last 3 results) No results for input(s): PROBNP in the last 8760 hours. HbA1C: No results for input(s): HGBA1C in the last 72 hours. CBG: No results for input(s): GLUCAP in the last 168 hours. Lipid Profile: No results for input(s): CHOL, HDL, LDLCALC, TRIG, CHOLHDL, LDLDIRECT in the last 72 hours. Thyroid Function Tests: No results for input(s): TSH, T4TOTAL, FREET4, T3FREE, THYROIDAB in the last 72 hours. Anemia Panel: No results for input(s): VITAMINB12, FOLATE, FERRITIN, TIBC, IRON, RETICCTPCT in the last 72 hours. Urine analysis:    Component Value Date/Time   COLORURINE Yellow 12/07/2012 1209   APPEARANCEUR Cloudy (A) 02/17/2018 1247   LABSPEC 1.012 12/07/2012 1209   PHURINE 6.0 12/07/2012 1209   GLUCOSEU Negative 02/17/2018 1247   GLUCOSEU Negative 12/07/2012 1209   HGBUR 1+ 12/07/2012 1209   BILIRUBINUR Negative 02/17/2018 1247   BILIRUBINUR Negative 12/07/2012 1209   KETONESUR Negative 12/07/2012 1209   PROTEINUR 1+ (A) 02/17/2018 1247   PROTEINUR Negative 12/07/2012 1209   NITRITE Negative 02/17/2018 1247   NITRITE Positive 12/07/2012 1209   LEUKOCYTESUR 1+ (A) 02/17/2018 1247   LEUKOCYTESUR 1+ 12/07/2012 1209    Radiological Exams on Admission: No results found.   Assessment/Plan Active Problems:   Overdose of coumadin, accidental or unintentional, initial encounter     1.  Accidental overdose of  Coumadin: Agree with monitoring.  Current INR is 1.4.  No  evidence of bleeding. Patient has induration of very high risk medication that can cause potential life-threatening bleeding, will monitor closely in the hospital. She was given activated charcoal.  Currently no indication for presumptive reversal agent or vitamin K.  Will monitor closely.  INR in 4 hours and everyday for 5 days.  2.  Type 2 diabetes on Metformin and Januvia: Hold oral hypoglycemics.  Keep on sliding scale insulin.  3.  Hypertension: Blood pressure stable.  Will resume home medications.  4.  Paroxysmal A. fib: Currently in sinus rhythm.  Holding Coumadin as above.  Continue beta-blockers.  5.  GERD with history of ulcer: On PPI that she will continue.   DVT prophylaxis: SCD.  On Coumadin. Code Status: Full code Family Communication: None at bedside Disposition Plan: Home Consults called: None Admission status: Observation telemetry.   Barb Merino MD [facility] Pager [phone]

## 2019-07-29 NOTE — ED Triage Notes (Signed)
Pt to ED by [facility] after an accidental overdose on her Warfarin. Per EMS and Pt she took approx 21 2mg  warfarin tablets. Pt denis SI and has no c/o of pain/discomfort at this time. Pt is A&O x4.

## 2019-07-30 DIAGNOSIS — T45511A Poisoning by anticoagulants, accidental (unintentional), initial encounter: Secondary | ICD-10-CM | POA: Diagnosis not present

## 2019-07-30 LAB — GLUCOSE, CAPILLARY
Glucose-Capillary: 99 mg/dL (ref 70–99)
Glucose-Capillary: 106 mg/dL — ABNORMAL HIGH (ref 70–99)
Glucose-Capillary: 101 mg/dL — ABNORMAL HIGH (ref 70–99)
Glucose-Capillary: 110 mg/dL — ABNORMAL HIGH (ref 70–99)

## 2019-07-30 LAB — PROTIME-INR
INR: 2.5 — ABNORMAL HIGH (ref 0.8–1.2)
INR: 3.7 — ABNORMAL HIGH (ref 0.8–1.2)
Prothrombin Time: 25.8 seconds — ABNORMAL HIGH (ref 11.4–15.2)
Prothrombin Time: 35.2 seconds — ABNORMAL HIGH (ref 11.4–15.2)

## 2019-07-30 MED ORDER — ALUM & MAG HYDROXIDE-SIMETH 200-200-20 MG/5ML PO SUSP
30.0000 mL | Freq: Four times a day (QID) | ORAL | Status: DC | PRN
  Administered 2019-07-30: 22:00:00 30 mL via ORAL
  Filled 2019-07-30: qty 30

## 2019-07-30 NOTE — Progress Notes (Signed)
PROGRESS NOTE    [name]  KGU:[id number] DOB: 09-30-1940 DOA: 07/29/2019 PCP: Baxter Hire, MD    Brief Narrative:  [name] is a 79 y.o. female with medical history significant of type 2 diabetes on Metformin, paroxysmal atrial fibrillation on beta-blockers and Coumadin, hypertension, anxiety, GERD, chronic systolic failure status post AICD presents to the emergency room after accidental warfarin overdose.  Patient usually pulls out all of her medications which are about 14 of them and puts it in a canister and takes all at once in the afternoon.  Today, she did put all her medications into a bottle which she thought is empty, she took all of them and after finishing them she realized that it was a Coumadin bottle where she had 21 days worth of Coumadin 2 mg each tablets.  No suicidal ideations.  She immediately realized that and called 911.  Patient denied any complaints.  She was just worried. ED Course: Hemodynamically stable.  INR is 1.4. Poison control was contacted, she was given a dose of oral activated charcoal and advised monitoring in the hospital.    Assessment & Plan:   Active Problems:   Overdose of coumadin, accidental or unintentional, initial encounter  1.  Accidental overdose of Coumadin: INR 1.4 -1.5 -2.5.  No evidence of bleeding or unwanted complications at this time. Patient has taken very high risk medication that can cause potential life-threatening bleeding, will monitor closely in the hospital. She was given activated charcoal.  Currently no indication for presumptive reversal agent or vitamin K.  Patient is unable to check her INR as outpatient tomorrow it being Sunday. Continue checking INR every 12 hours.  2.  Type 2 diabetes on Metformin and Januvia: Hold oral hypoglycemics.  Keep on sliding scale insulin.  3.  Hypertension: Blood pressure stable.  Will resume home medications.  4.  Paroxysmal A. fib: Currently in sinus rhythm.  Holding  Coumadin as above.  Continue beta-blockers.  5.  GERD with history of ulcer: On PPI that she will continue   DVT prophylaxis: Therapeutic INR.   Code Status: Full code Family Communication: None Disposition Plan: Status is: Observation  The patient will require care spanning > 2 midnights and should be moved to inpatient because: Ongoing diagnostic testing needed not appropriate for outpatient work up and Unsafe d/c plan  Patient has taken higher doses of Coumadin, 42 mg by accident.  No outpatient facilities available to check her INR every day including Sunday.  She is at high risk of Coumadin toxicity and related complications so she will need close monitoring in the hospital.  If remains a stable by Sunday evening, will arrange for outpatient follow-up Monday morning and probable discharge home.  Dispo: The patient is from: Home              Anticipated d/c is to: Home              Anticipated d/c date is: 1 day              Patient currently is not medically stable to d/c.         Consultants:   None  Procedures:   None  Antimicrobials:   None   Subjective: Patient seen and examined.  No overnight events.  Has not ambulated yet.  Denies any nausea vomiting.  Denies any bleeding in her urine or stool.  Objective: Vitals:   07/29/19 1700 07/29/19 2031 07/30/19 0139 07/30/19 0727  BP: Marland Kitchen)  143/94 (!) 134/37 (!) 115/48 115/64  Pulse: 79 72 72 79  Resp:  18 17 17   Temp: 98.3 F (36.8 C) 98.4 F (36.9 C) 98.3 F (36.8 C) (!) 97.5 F (36.4 C)  TempSrc: Oral Oral  Oral  SpO2:  90% 92% 94%  Weight:      Height:        Intake/Output Summary (Last 24 hours) at 07/30/2019 1028 Last data filed at 07/30/2019 0453 Gross per 24 hour  Intake --  Output 150 ml  Net -150 ml   Filed Weights   07/29/19 1425  Weight: 110.7 kg    Examination:  General exam: Appears calm and comfortable  Respiratory system: Clear to auscultation. Respiratory effort  normal. Cardiovascular system: S1 & S2 heard, RRR. No JVD, murmurs, rubs, gallops or clicks.  Gastrointestinal system: Abdomen is nondistended, soft and nontender. No organomegaly or masses felt. Normal bowel sounds heard. Central nervous system: Alert and oriented. No focal neurological deficits. Left knee and leg: Resolving hematoma and ecchymosis.   Data Reviewed: I have personally reviewed following labs and imaging studies  CBC: Recent Labs  Lab 07/29/19 1422  WBC 7.2  NEUTROABS 5.2  HGB 10.7*  HCT 34.4*  MCV 94.0  PLT 195   Basic Metabolic Panel: Recent Labs  Lab 07/29/19 1422  NA 139  K 4.2  CL 101  CO2 29  GLUCOSE 130*  BUN 13  CREATININE 0.67  CALCIUM 8.7*   GFR: Estimated Creatinine Clearance: 71.5 mL/min (by C-G formula based on SCr of 0.67 mg/dL). Liver Function Tests: Recent Labs  Lab 07/29/19 1422  AST 19  ALT 8  ALKPHOS 46  BILITOT 1.3*  PROT 6.9  ALBUMIN 3.4*   No results for input(s): LIPASE, AMYLASE in the last 168 hours. No results for input(s): AMMONIA in the last 168 hours. Coagulation Profile: Recent Labs  Lab 07/29/19 1422 07/29/19 1734 07/30/19 0748  INR 1.4* 1.5* 2.5*   Cardiac Enzymes: No results for input(s): CKTOTAL, CKMB, CKMBINDEX, TROPONINI in the last 168 hours. BNP (last 3 results) No results for input(s): PROBNP in the last 8760 hours. HbA1C: No results for input(s): HGBA1C in the last 72 hours. CBG: Recent Labs  Lab 07/29/19 1613 07/29/19 2106 07/30/19 0729  GLUCAP 159* 117* 99   Lipid Profile: No results for input(s): CHOL, HDL, LDLCALC, TRIG, CHOLHDL, LDLDIRECT in the last 72 hours. Thyroid Function Tests: No results for input(s): TSH, T4TOTAL, FREET4, T3FREE, THYROIDAB in the last 72 hours. Anemia Panel: No results for input(s): VITAMINB12, FOLATE, FERRITIN, TIBC, IRON, RETICCTPCT in the last 72 hours. Sepsis Labs: No results for input(s): PROCALCITON, LATICACIDVEN in the last 168 hours.  Recent  Results (from the past 240 hour(s))  SARS Coronavirus 2 by RT PCR (hospital order, performed in [facility] hospital lab) Nasopharyngeal Nasopharyngeal Swab     Status: None   Collection Time: 07/29/19  4:16 PM   Specimen: Nasopharyngeal Swab  Result Value Ref Range Status   SARS Coronavirus 2 NEGATIVE NEGATIVE Final    Comment: (NOTE) SARS-CoV-2 target nucleic acids are NOT DETECTED.  The SARS-CoV-2 RNA is generally detectable in upper and lower respiratory specimens during the acute phase of infection. The lowest concentration of SARS-CoV-2 viral copies this assay can detect is 250 copies / mL. A negative result does not preclude SARS-CoV-2 infection and should not be used as the sole basis for treatment or other patient management decisions.  A negative result may occur with improper specimen collection /  handling, submission of specimen other than nasopharyngeal swab, presence of viral mutation(s) within the areas targeted by this assay, and inadequate number of viral copies (<250 copies / mL). A negative result must be combined with clinical observations, patient history, and epidemiological information.  Fact Sheet for Patients:   BoilerBrush.com.cy  Fact Sheet for Healthcare Providers: https://pope.com/  This test is not yet approved or  cleared by the [place] FDA and has been authorized for detection and/or diagnosis of SARS-CoV-2 by FDA under an Emergency Use Authorization (EUA).  This EUA will remain in effect (meaning this test can be used) for the duration of the COVID-19 declaration under Section 564(b)(1) of the Act, 21 U.S.C. section 360bbb-3(b)(1), unless the authorization is terminated or revoked sooner.  Performed at [facility], [address], [place], [place] [address]          Radiology Studies: No results found.      Scheduled Meds: . carvedilol  12.5 mg Oral BID WC  .  cholecalciferol  1,000 Units Oral Daily  . furosemide  40 mg Oral Daily  . gabapentin  200 mg Oral TID  . insulin aspart  0-5 Units Subcutaneous QHS  . insulin aspart  0-9 Units Subcutaneous TID WC  . multivitamin with minerals  1 tablet Oral Daily  . nortriptyline  20 mg Oral QHS  . pantoprazole  40 mg Oral Daily  . quinapril  40 mg Oral Daily  . rosuvastatin  10 mg Oral Daily  . sertraline  100 mg Oral Daily  . vitamin B-12  1,000 mcg Oral Daily   Continuous Infusions:   LOS: 0 days    Time spent: 25 minutes    Dorcas Carrow, MD [facility] Pager [phone]

## 2019-07-31 DIAGNOSIS — I110 Hypertensive heart disease with heart failure: Secondary | ICD-10-CM | POA: Diagnosis present

## 2019-07-31 DIAGNOSIS — Z7983 Long term (current) use of bisphosphonates: Secondary | ICD-10-CM | POA: Diagnosis not present

## 2019-07-31 DIAGNOSIS — Z95810 Presence of automatic (implantable) cardiac defibrillator: Secondary | ICD-10-CM | POA: Diagnosis not present

## 2019-07-31 DIAGNOSIS — M797 Fibromyalgia: Secondary | ICD-10-CM | POA: Diagnosis present

## 2019-07-31 DIAGNOSIS — Z79899 Other long term (current) drug therapy: Secondary | ICD-10-CM | POA: Diagnosis not present

## 2019-07-31 DIAGNOSIS — Z8711 Personal history of peptic ulcer disease: Secondary | ICD-10-CM | POA: Diagnosis not present

## 2019-07-31 DIAGNOSIS — Z96653 Presence of artificial knee joint, bilateral: Secondary | ICD-10-CM | POA: Diagnosis present

## 2019-07-31 DIAGNOSIS — G4730 Sleep apnea, unspecified: Secondary | ICD-10-CM | POA: Diagnosis present

## 2019-07-31 DIAGNOSIS — K219 Gastro-esophageal reflux disease without esophagitis: Secondary | ICD-10-CM | POA: Diagnosis present

## 2019-07-31 DIAGNOSIS — E785 Hyperlipidemia, unspecified: Secondary | ICD-10-CM | POA: Diagnosis present

## 2019-07-31 DIAGNOSIS — Z888 Allergy status to other drugs, medicaments and biological substances status: Secondary | ICD-10-CM | POA: Diagnosis not present

## 2019-07-31 DIAGNOSIS — Z87891 Personal history of nicotine dependence: Secondary | ICD-10-CM | POA: Diagnosis not present

## 2019-07-31 DIAGNOSIS — Z7984 Long term (current) use of oral hypoglycemic drugs: Secondary | ICD-10-CM | POA: Diagnosis not present

## 2019-07-31 DIAGNOSIS — T45511A Poisoning by anticoagulants, accidental (unintentional), initial encounter: Secondary | ICD-10-CM | POA: Diagnosis present

## 2019-07-31 DIAGNOSIS — Z882 Allergy status to sulfonamides status: Secondary | ICD-10-CM | POA: Diagnosis not present

## 2019-07-31 DIAGNOSIS — Z8673 Personal history of transient ischemic attack (TIA), and cerebral infarction without residual deficits: Secondary | ICD-10-CM | POA: Diagnosis not present

## 2019-07-31 DIAGNOSIS — Z981 Arthrodesis status: Secondary | ICD-10-CM | POA: Diagnosis not present

## 2019-07-31 DIAGNOSIS — Z90722 Acquired absence of ovaries, bilateral: Secondary | ICD-10-CM | POA: Diagnosis not present

## 2019-07-31 DIAGNOSIS — Y92009 Unspecified place in unspecified non-institutional (private) residence as the place of occurrence of the external cause: Secondary | ICD-10-CM | POA: Diagnosis not present

## 2019-07-31 DIAGNOSIS — E119 Type 2 diabetes mellitus without complications: Secondary | ICD-10-CM | POA: Diagnosis present

## 2019-07-31 DIAGNOSIS — Z9049 Acquired absence of other specified parts of digestive tract: Secondary | ICD-10-CM | POA: Diagnosis not present

## 2019-07-31 DIAGNOSIS — Z20822 Contact with and (suspected) exposure to covid-19: Secondary | ICD-10-CM | POA: Diagnosis present

## 2019-07-31 DIAGNOSIS — I480 Paroxysmal atrial fibrillation: Secondary | ICD-10-CM | POA: Diagnosis present

## 2019-07-31 DIAGNOSIS — Z7901 Long term (current) use of anticoagulants: Secondary | ICD-10-CM | POA: Diagnosis not present

## 2019-07-31 DIAGNOSIS — I5022 Chronic systolic (congestive) heart failure: Secondary | ICD-10-CM | POA: Diagnosis present

## 2019-07-31 LAB — GLUCOSE, CAPILLARY
Glucose-Capillary: 113 mg/dL — ABNORMAL HIGH (ref 70–99)
Glucose-Capillary: 113 mg/dL — ABNORMAL HIGH (ref 70–99)
Glucose-Capillary: 137 mg/dL — ABNORMAL HIGH (ref 70–99)
Glucose-Capillary: 111 mg/dL — ABNORMAL HIGH (ref 70–99)

## 2019-07-31 LAB — HEMOGLOBIN AND HEMATOCRIT, BLOOD
HCT: 34.5 % — ABNORMAL LOW (ref 36.0–46.0)
Hemoglobin: 11 g/dL — ABNORMAL LOW (ref 12.0–15.0)

## 2019-07-31 LAB — PROTIME-INR
INR: 6.1 (ref 0.8–1.2)
INR: 7.8 (ref 0.8–1.2)
Prothrombin Time: 52.6 seconds — ABNORMAL HIGH (ref 11.4–15.2)
Prothrombin Time: 63.4 seconds — ABNORMAL HIGH (ref 11.4–15.2)

## 2019-07-31 MED ORDER — POLYETHYLENE GLYCOL 3350 17 G PO PACK
17.0000 g | PACK | Freq: Every day | ORAL | Status: DC
  Administered 2019-07-31: 17 g via ORAL
  Filled 2019-07-31 (×2): qty 1

## 2019-07-31 MED ORDER — SENNOSIDES-DOCUSATE SODIUM 8.6-50 MG PO TABS
1.0000 | ORAL_TABLET | Freq: Two times a day (BID) | ORAL | Status: DC
  Administered 2019-07-31: 21:00:00 1 via ORAL
  Filled 2019-07-31 (×2): qty 1

## 2019-07-31 MED ORDER — PHYTONADIONE 5 MG PO TABS
10.0000 mg | ORAL_TABLET | Freq: Once | ORAL | Status: AC
  Administered 2019-07-31: 21:00:00 10 mg via ORAL
  Filled 2019-07-31 (×2): qty 2

## 2019-07-31 NOTE — Progress Notes (Signed)
PROGRESS NOTE    [name]  XBJ:[id number] DOB: 02-22-1940 DOA: 07/29/2019 PCP: Gracelyn Nurse, MD    Brief Narrative:  [name] is a 79 y.o. female with medical history significant of type 2 diabetes on Metformin, paroxysmal atrial fibrillation on beta-blockers and Coumadin, hypertension, anxiety, GERD, chronic systolic failure status post AICD presents to the emergency room after accidental warfarin overdose.  Patient usually pulls out all of her medications which are about 14 of them and puts it in a canister and takes all at once in the afternoon.  Today, she did put all her medications into a bottle which she thought is empty, she took all of them and after finishing them she realized that it was a Coumadin bottle where she had 21 days worth of Coumadin 2 mg each tablets.  No suicidal ideations.  She immediately realized that and called 911.  Patient denied any complaints.  She was just worried. ED Course: Hemodynamically stable.  INR is 1.4. Poison control was contacted, she was given a dose of oral activated charcoal and advised monitoring in the hospital.    Assessment & Plan:   Active Problems:   Overdose of coumadin, accidental or unintentional, initial encounter  1.  Accidental overdose of Coumadin: INR 1.4 -1.5 -2.5-6.  No evidence of bleeding or unwanted complications at this time. Patient has taken very high risk medication that can cause potential life-threatening bleeding, will monitor closely in the hospital. She was given activated charcoal.  INR is 6.  No indication for reversal today. Unable to connect to outpatient office today to recheck INR tomorrow. We will continue monitoring in the hospital.  2.  Type 2 diabetes on Metformin and Januvia: Hold oral hypoglycemics.  Keep on sliding scale insulin.  3.  Hypertension: Blood pressure stable.    Slightly low blood pressure reading in the morning, holding ACE inhibitor.  4.  Paroxysmal A. fib: Currently  in sinus rhythm.  Holding Coumadin as above.  Continue beta-blockers.  5.  GERD with history of ulcer: On PPI that she will continue   DVT prophylaxis: Therapeutic INR.   Code Status: Full code Family Communication: None Disposition Plan: Status is: Observation  The patient will require care spanning > 2 midnights and should be moved to inpatient because: Ongoing diagnostic testing needed not appropriate for outpatient work up and Unsafe d/c plan  Patient has taken higher doses of Coumadin, 42 mg by accident.  No outpatient facilities available to check her INR every day including Sunday.  She is at high risk of Coumadin toxicity and related complications so she will need close monitoring in the hospital.   Dispo: The patient is from: Home              Anticipated d/c is to: Home              Anticipated d/c date is: 1 day              Patient currently is not medically stable to d/c.  Unsafe to go home, requiring medication monitoring.    Consultants:   None  Procedures:   None  Antimicrobials:   None   Subjective: Seen and examined.  No overnight events.  She developed some left-sided flank pain, she has reproducible pain on her left lower chest wall with no evidence of hematoma.  Hemoglobin is stable.  She had fallen last week on her left side with left knee contusion.  Objective: Vitals:  07/31/19 0104 07/31/19 0537 07/31/19 0840 07/31/19 1208  BP: (!) 104/44 (!) 112/53 (!) 86/41 105/80  Pulse: 71 73 68 70  Resp: 16 17 18 20   Temp: 97.6 F (36.4 C) 97.7 F (36.5 C) 98.2 F (36.8 C) 98.3 F (36.8 C)  TempSrc:  Oral Oral Oral  SpO2: (!) 89% 94% 93% 93%  Weight:      Height:       No intake or output data in the 24 hours ending 07/31/19 1256 Filed Weights   07/29/19 1425  Weight: 110.7 kg    Examination:  General exam: Appears calm and comfortable  Respiratory system: Clear to auscultation. Respiratory effort normal. Cardiovascular system: S1 & S2  heard, RRR. No JVD, murmurs, rubs, gallops or clicks.  Gastrointestinal system: Abdomen is nondistended, soft and nontender. No organomegaly or masses felt. Normal bowel sounds heard. Central nervous system: Alert and oriented. No focal neurological deficits. Left knee and leg: Resolving hematoma and ecchymosis. Mild palpable tenderness along the left lateral chest wall with no evidence of hematoma or swelling.   Data Reviewed: I have personally reviewed following labs and imaging studies  CBC: Recent Labs  Lab 07/29/19 1422 07/31/19 1000  WBC 7.2  --   NEUTROABS 5.2  --   HGB 10.7* 11.0*  HCT 34.4* 34.5*  MCV 94.0  --   PLT 195  --    Basic Metabolic Panel: Recent Labs  Lab 07/29/19 1422  NA 139  K 4.2  CL 101  CO2 29  GLUCOSE 130*  BUN 13  CREATININE 0.67  CALCIUM 8.7*   GFR: Estimated Creatinine Clearance: 71.5 mL/min (by C-G formula based on SCr of 0.67 mg/dL). Liver Function Tests: Recent Labs  Lab 07/29/19 1422  AST 19  ALT 8  ALKPHOS 46  BILITOT 1.3*  PROT 6.9  ALBUMIN 3.4*   No results for input(s): LIPASE, AMYLASE in the last 168 hours. No results for input(s): AMMONIA in the last 168 hours. Coagulation Profile: Recent Labs  Lab 07/29/19 1422 07/29/19 1734 07/30/19 0748 07/30/19 1658 07/31/19 0439  INR 1.4* 1.5* 2.5* 3.7* 6.1*   Cardiac Enzymes: No results for input(s): CKTOTAL, CKMB, CKMBINDEX, TROPONINI in the last 168 hours. BNP (last 3 results) No results for input(s): PROBNP in the last 8760 hours. HbA1C: No results for input(s): HGBA1C in the last 72 hours. CBG: Recent Labs  Lab 07/30/19 1206 07/30/19 1641 07/30/19 2057 07/31/19 0838 07/31/19 1206  GLUCAP 106* 101* 110* 113* 113*   Lipid Profile: No results for input(s): CHOL, HDL, LDLCALC, TRIG, CHOLHDL, LDLDIRECT in the last 72 hours. Thyroid Function Tests: No results for input(s): TSH, T4TOTAL, FREET4, T3FREE, THYROIDAB in the last 72 hours. Anemia Panel: No results  for input(s): VITAMINB12, FOLATE, FERRITIN, TIBC, IRON, RETICCTPCT in the last 72 hours. Sepsis Labs: No results for input(s): PROCALCITON, LATICACIDVEN in the last 168 hours.  Recent Results (from the past 240 hour(s))  SARS Coronavirus 2 by RT PCR (hospital order, performed in [facility] hospital lab) Nasopharyngeal Nasopharyngeal Swab     Status: None   Collection Time: 07/29/19  4:16 PM   Specimen: Nasopharyngeal Swab  Result Value Ref Range Status   SARS Coronavirus 2 NEGATIVE NEGATIVE Final    Comment: (NOTE) SARS-CoV-2 target nucleic acids are NOT DETECTED.  The SARS-CoV-2 RNA is generally detectable in upper and lower respiratory specimens during the acute phase of infection. The lowest concentration of SARS-CoV-2 viral copies this assay can detect is 250 copies / mL.  A negative result does not preclude SARS-CoV-2 infection and should not be used as the sole basis for treatment or other patient management decisions.  A negative result may occur with improper specimen collection / handling, submission of specimen other than nasopharyngeal swab, presence of viral mutation(s) within the areas targeted by this assay, and inadequate number of viral copies (<250 copies / mL). A negative result must be combined with clinical observations, patient history, and epidemiological information.  Fact Sheet for Patients:   BoilerBrush.com.cy  Fact Sheet for Healthcare Providers: https://pope.com/  This test is not yet approved or  cleared by the [place] FDA and has been authorized for detection and/or diagnosis of SARS-CoV-2 by FDA under an Emergency Use Authorization (EUA).  This EUA will remain in effect (meaning this test can be used) for the duration of the COVID-19 declaration under Section 564(b)(1) of the Act, 21 U.S.C. section 360bbb-3(b)(1), unless the authorization is terminated or revoked sooner.  Performed at [facility], [address], [place], [place] [address]          Radiology Studies: No results found.      Scheduled Meds: . carvedilol  12.5 mg Oral BID WC  . cholecalciferol  1,000 Units Oral Daily  . furosemide  40 mg Oral Daily  . gabapentin  200 mg Oral TID  . insulin aspart  0-5 Units Subcutaneous QHS  . insulin aspart  0-9 Units Subcutaneous TID WC  . multivitamin with minerals  1 tablet Oral Daily  . nortriptyline  20 mg Oral QHS  . pantoprazole  40 mg Oral Daily  . rosuvastatin  10 mg Oral Daily  . sertraline  100 mg Oral Daily  . vitamin B-12  1,000 mcg Oral Daily   Continuous Infusions:   LOS: 0 days    Time spent: 25 minutes    Dorcas Carrow, MD [facility] Pager [phone]

## 2019-07-31 NOTE — Progress Notes (Signed)
Pt BP 86/41 this morning. Asymptomatic. MD made aware. Held pt morning BP meds. MD ordered stat H&H lab draw. Will continue to monitor.

## 2019-07-31 NOTE — Progress Notes (Signed)
Md notified of critical INR of 7.75. No new orders at this time. Will continue to monitor.

## 2019-08-01 LAB — GLUCOSE, CAPILLARY
Glucose-Capillary: 103 mg/dL — ABNORMAL HIGH (ref 70–99)
Glucose-Capillary: 128 mg/dL — ABNORMAL HIGH (ref 70–99)

## 2019-08-01 LAB — PROTIME-INR
INR: 3.4 — ABNORMAL HIGH (ref 0.8–1.2)
Prothrombin Time: 33.3 seconds — ABNORMAL HIGH (ref 11.4–15.2)

## 2019-08-01 MED ORDER — MECLIZINE HCL 25 MG PO TABS
25.0000 mg | ORAL_TABLET | Freq: Three times a day (TID) | ORAL | Status: DC | PRN
  Administered 2019-08-01 (×2): 25 mg via ORAL
  Filled 2019-08-01 (×3): qty 1

## 2019-08-01 NOTE — Discharge Summary (Signed)
Physician Discharge Summary  [name] TZG:[id number] DOB: 20-Apr-1940 DOA: 07/29/2019  PCP: Gracelyn Nurse, MD  Admit date: 07/29/2019 Discharge date: 08/01/2019  Admitted From: home  Disposition:  Home   Recommendations for Outpatient Follow-up:  1. Follow up with PCP in 1 weeks 2. INR tomorrow at primary care office clinic,   Discharge Condition: Stable CODE STATUS: Full code Diet recommendation: Low-salt, low-carb diet  Discharge summary:  [name] a 79 y.o.femalewith medical history significant oftype 2 diabetes on Metformin,paroxysmal atrial fibrillation on beta-blockers and Coumadin, hypertension, anxiety, GERD, chronic systolic failure status post AICD came to the emergency room after accidental warfarin overdose.  Patient usually pulls out all of her medications which are about 14 of them and puts it in a canister and takes all at once in the afternoon. she did put all her medications into a bottle which she thought is empty, she took all of them and after finishing them she realized that it was a Coumadin bottle where she had 21 days worth of Coumadin 2 mg each tablets. No suicidal ideations. She immediately realized that and called 911. Patient denied any complaints. She was just worried. In the emergency room she was fairly stable.  It was 1 hour after medication conjunction, she was given activated charcoal and advised monitoring in the hospital.  Accidental overdose of Coumadin: INR 1.4 -1.5 -2.5-6.1-7.8 , 10 mg oral vitamin K-3.4 today. No evidence of bleeding or unwanted complications at this time. With adequate instability, she is discharging home today, next INR will be checked tomorrow morning. I have called and updated her primary care clinic, discussed with Dr. Letitia Libra and clinic will schedule her to come back for INR check tomorrow morning.  She will resume all her medications except Coumadin.  Will wait until at least INR drops down to less than 2  to resume her medications.    Discharge Diagnoses:  Active Problems:   Overdose of coumadin, accidental or unintentional, initial encounter    Discharge Instructions  Discharge Instructions    Diet - low sodium heart healthy   Complete by: As directed    Diet Carb Modified   Complete by: As directed    Discharge instructions   Complete by: As directed    Recheck INR 6/29, 6/30 at PCP office. Resume coumadin only after called by your doctor.   Increase activity slowly   Complete by: As directed      Allergies as of 08/01/2019      Reactions   Ciprofloxacin Hcl Itching   Rash and itching all over stomach and arms   Dopamine Nausea And Vomiting   Sulfa Antibiotics Rash   Sulfacetamide Sodium Rash   Tegaderm Ag Mesh [silver] Other (See Comments), Rash   Blisters and takes skin off      Medication List    STOP taking these medications   warfarin 2 MG tablet Commonly known as: COUMADIN   warfarin 2.5 MG tablet Commonly known as: COUMADIN     TAKE these medications   alendronate 70 MG tablet Commonly known as: FOSAMAX Take 70 mg by mouth every Monday.   carvedilol 12.5 MG tablet Commonly known as: COREG Take 12.5 mg by mouth 2 (two) times daily with a meal.   furosemide 20 MG tablet Commonly known as: LASIX Take 40 mg by mouth daily.   gabapentin 100 MG capsule Commonly known as: NEURONTIN Take 200 mg by mouth 3 (three) times daily.   meclizine 25 MG tablet  Commonly known as: ANTIVERT Take 25 mg by mouth 3 (three) times daily as needed for dizziness.   metFORMIN 500 MG tablet Commonly known as: GLUCOPHAGE Take 500 mg by mouth 2 (two) times daily with a meal.   multivitamin with minerals tablet Take 1 tablet by mouth daily.   nortriptyline 10 MG capsule Commonly known as: PAMELOR Take 10-20 mg by mouth See admin instructions. Take 1 capsule (10mg ) by mouth nightly for 1 week then take 2 capsules (20mg ) by mouth nightly   omeprazole 20 MG  capsule Commonly known as: PRILOSEC Take 20 mg by mouth daily.   pioglitazone 15 MG tablet Commonly known as: ACTOS Take 15 mg by mouth daily.   quinapril 40 MG tablet Commonly known as: ACCUPRIL Take 40 mg by mouth daily.   RA Vitamin B-12 TR 1000 MCG Tbcr Generic drug: Cyanocobalamin Take 1,000 mcg by mouth daily.   rosuvastatin 10 MG tablet Commonly known as: CRESTOR Take 10 mg by mouth daily.   sertraline 100 MG tablet Commonly known as: ZOLOFT Take 100 mg by mouth daily.   triamcinolone cream 0.1 % Commonly known as: KENALOG Apply 1 application topically daily as needed (Wound).   Vitamin D-1000 Max St 25 MCG (1000 UT) tablet Generic drug: Cholecalciferol Take 1,000 Units by mouth daily.       Allergies  Allergen Reactions  . Ciprofloxacin Hcl Itching    Rash and itching all over stomach and arms  . Dopamine Nausea And Vomiting  . Sulfa Antibiotics Rash  . Sulfacetamide Sodium Rash  . Tegaderm Ag Mesh [Silver] Other (See Comments) and Rash    Blisters and takes skin off    Procedures/Studies: DG Chest 2 View  Result Date: 07/22/2019 CLINICAL DATA:  Hypoxemia. EXAM: CHEST - 2 VIEW COMPARISON:  Chest x-ray dated 12/17/2016 FINDINGS: Chronic mild cardiomegaly. Enlargement of the main pulmonary arteries is chronic and suggestive of pulmonary arterial hypertension. No infiltrates or effusions. No pulmonary edema. Calcified granuloma at the right lung base, unchanged. AICD in place. No acute bone abnormality. Slight accentuation of the thoracic kyphosis. IMPRESSION: No acute abnormality. Chronic mild cardiomegaly. Possible pulmonary arterial hypertension. Electronically Signed   By: Francene BoyersJames  Maxwell M.D.   On: 07/22/2019 15:18   DG Knee Complete 4 Views Left  Result Date: 07/22/2019 CLINICAL DATA:  Fall.  Injury. EXAM: LEFT KNEE - COMPLETE 4+ VIEW COMPARISON:  No prior. FINDINGS: Total left knee replacement. Hardware intact. Anatomic alignment. No acute bony  abnormality. No evidence of fracture or dislocation. IMPRESSION: Total left knee replacement. Hardware intact. Anatomic alignment. No acute abnormality. Electronically Signed   By: Maisie Fushomas  Register   On: 07/22/2019 13:29   (Echo, Carotid, EGD, Colonoscopy, ERCP)    Subjective: Patient seen and examined.  No overnight events.  Eager to go home.   Discharge Exam: Vitals:   08/01/19 0442 08/01/19 0838  BP: (!) 124/58 (!) 151/65  Pulse: 70 71  Resp: 15   Temp: 98.5 F (36.9 C) 98.1 F (36.7 C)  SpO2: 90% 90%   Vitals:   07/31/19 1552 07/31/19 2007 08/01/19 0442 08/01/19 0838  BP: (!) 147/63 136/62 (!) 124/58 (!) 151/65  Pulse: 69 73 70 71  Resp: 18 18 15    Temp: 98.9 F (37.2 C) 98.1 F (36.7 C) 98.5 F (36.9 C) 98.1 F (36.7 C)  TempSrc: Oral Oral  Oral  SpO2: 92% 93% 90% 90%  Weight:      Height:        General:  Pt is alert, awake, not in acute distress Cardiovascular: RRR, S1/S2 +, no rubs, no gallops, pacemaker in place Respiratory: CTA bilaterally, no wheezing, no rhonchi Abdominal: Soft, NT, ND, bowel sounds + Extremities: no edema, no cyanosis Patient does have some pitting ecchymosis on her left knee from previous fall.    The results of significant diagnostics from this hospitalization (including imaging, microbiology, ancillary and laboratory) are listed below for reference.     Microbiology: Recent Results (from the past 240 hour(s))  SARS Coronavirus 2 by RT PCR (hospital order, performed in [facility] hospital lab) Nasopharyngeal Nasopharyngeal Swab     Status: None   Collection Time: 07/29/19  4:16 PM   Specimen: Nasopharyngeal Swab  Result Value Ref Range Status   SARS Coronavirus 2 NEGATIVE NEGATIVE Final    Comment: (NOTE) SARS-CoV-2 target nucleic acids are NOT DETECTED.  The SARS-CoV-2 RNA is generally detectable in upper and lower respiratory specimens during the acute phase of infection. The lowest concentration of SARS-CoV-2 viral copies  this assay can detect is 250 copies / mL. A negative result does not preclude SARS-CoV-2 infection and should not be used as the sole basis for treatment or other patient management decisions.  A negative result may occur with improper specimen collection / handling, submission of specimen other than nasopharyngeal swab, presence of viral mutation(s) within the areas targeted by this assay, and inadequate number of viral copies (<250 copies / mL). A negative result must be combined with clinical observations, patient history, and epidemiological information.  Fact Sheet for Patients:   BoilerBrush.com.cy  Fact Sheet for Healthcare Providers: https://pope.com/  This test is not yet approved or  cleared by the [place] FDA and has been authorized for detection and/or diagnosis of SARS-CoV-2 by FDA under an Emergency Use Authorization (EUA).  This EUA will remain in effect (meaning this test can be used) for the duration of the COVID-19 declaration under Section 564(b)(1) of the Act, 21 U.S.C. section 360bbb-3(b)(1), unless the authorization is terminated or revoked sooner.  Performed at [facility], [address] [place], [place] [address]      Labs: BNP (last 3 results) No results for input(s): BNP in the last 8760 hours. Basic Metabolic Panel: Recent Labs  Lab 07/29/19 1422  NA 139  K 4.2  CL 101  CO2 29  GLUCOSE 130*  BUN 13  CREATININE 0.67  CALCIUM 8.7*   Liver Function Tests: Recent Labs  Lab 07/29/19 1422  AST 19  ALT 8  ALKPHOS 46  BILITOT 1.3*  PROT 6.9  ALBUMIN 3.4*   No results for input(s): LIPASE, AMYLASE in the last 168 hours. No results for input(s): AMMONIA in the last 168 hours. CBC: Recent Labs  Lab 07/29/19 1422 07/31/19 1000  WBC 7.2  --   NEUTROABS 5.2  --   HGB 10.7* 11.0*  HCT 34.4* 34.5*  MCV 94.0  --   PLT 195  --    Cardiac Enzymes: No results for input(s):  CKTOTAL, CKMB, CKMBINDEX, TROPONINI in the last 168 hours. BNP: Invalid input(s): POCBNP CBG: Recent Labs  Lab 07/31/19 0838 07/31/19 1206 07/31/19 1551 07/31/19 2051 08/01/19 0816  GLUCAP 113* 113* 137* 111* 103*   D-Dimer No results for input(s): DDIMER in the last 72 hours. Hgb A1c No results for input(s): HGBA1C in the last 72 hours. Lipid Profile No results for input(s): CHOL, HDL, LDLCALC, TRIG, CHOLHDL, LDLDIRECT in the last 72 hours. Thyroid function studies No results for input(s):  TSH, T4TOTAL, T3FREE, THYROIDAB in the last 72 hours.  Invalid input(s): FREET3 Anemia work up No results for input(s): VITAMINB12, FOLATE, FERRITIN, TIBC, IRON, RETICCTPCT in the last 72 hours. Urinalysis    Component Value Date/Time   COLORURINE Yellow 12/07/2012 1209   APPEARANCEUR Cloudy (A) 02/17/2018 1247   LABSPEC 1.012 12/07/2012 1209   PHURINE 6.0 12/07/2012 1209   GLUCOSEU Negative 02/17/2018 1247   GLUCOSEU Negative 12/07/2012 1209   HGBUR 1+ 12/07/2012 1209   BILIRUBINUR Negative 02/17/2018 1247   BILIRUBINUR Negative 12/07/2012 1209   KETONESUR Negative 12/07/2012 1209   PROTEINUR 1+ (A) 02/17/2018 1247   PROTEINUR Negative 12/07/2012 1209   NITRITE Negative 02/17/2018 1247   NITRITE Positive 12/07/2012 1209   LEUKOCYTESUR 1+ (A) 02/17/2018 1247   LEUKOCYTESUR 1+ 12/07/2012 1209   Sepsis Labs Invalid input(s): PROCALCITONIN,  WBC,  LACTICIDVEN Microbiology Recent Results (from the past 240 hour(s))  SARS Coronavirus 2 by RT PCR (hospital order, performed in [facility] hospital lab) Nasopharyngeal Nasopharyngeal Swab     Status: None   Collection Time: 07/29/19  4:16 PM   Specimen: Nasopharyngeal Swab  Result Value Ref Range Status   SARS Coronavirus 2 NEGATIVE NEGATIVE Final    Comment: (NOTE) SARS-CoV-2 target nucleic acids are NOT DETECTED.  The SARS-CoV-2 RNA is generally detectable in upper and lower respiratory specimens during the acute phase of  infection. The lowest concentration of SARS-CoV-2 viral copies this assay can detect is 250 copies / mL. A negative result does not preclude SARS-CoV-2 infection and should not be used as the sole basis for treatment or other patient management decisions.  A negative result may occur with improper specimen collection / handling, submission of specimen other than nasopharyngeal swab, presence of viral mutation(s) within the areas targeted by this assay, and inadequate number of viral copies (<250 copies / mL). A negative result must be combined with clinical observations, patient history, and epidemiological information.  Fact Sheet for Patients:   BoilerBrush.com.cy  Fact Sheet for Healthcare Providers: https://pope.com/  This test is not yet approved or  cleared by the [place] FDA and has been authorized for detection and/or diagnosis of SARS-CoV-2 by FDA under an Emergency Use Authorization (EUA).  This EUA will remain in effect (meaning this test can be used) for the duration of the COVID-19 declaration under Section 564(b)(1) of the Act, 21 U.S.C. section 360bbb-3(b)(1), unless the authorization is terminated or revoked sooner.  Performed at [facility], [address], [place], [place] [address]      Time coordinating discharge:  35 minutes  SIGNED:   Dorcas Carrow, MD  [facility] 08/01/2019, 10:58 AM

## 2019-08-01 NOTE — Progress Notes (Signed)
Pt complaining of vertigo, states that she has a history of this and that this is not new for her, takes PRN meclizine at home, NP Ouma made aware-Meclizine ordered

## 2019-10-25 ENCOUNTER — Emergency Department: Payer: Medicare Other

## 2019-10-25 ENCOUNTER — Emergency Department
Admission: EM | Admit: 2019-10-25 | Discharge: 2019-10-25 | Payer: Medicare Other | Attending: Emergency Medicine | Admitting: Emergency Medicine

## 2019-10-25 ENCOUNTER — Other Ambulatory Visit: Payer: Self-pay

## 2019-10-25 DIAGNOSIS — M546 Pain in thoracic spine: Secondary | ICD-10-CM | POA: Insufficient documentation

## 2019-10-25 DIAGNOSIS — I4891 Unspecified atrial fibrillation: Secondary | ICD-10-CM | POA: Insufficient documentation

## 2019-10-25 DIAGNOSIS — W19XXXA Unspecified fall, initial encounter: Secondary | ICD-10-CM

## 2019-10-25 DIAGNOSIS — S0990XA Unspecified injury of head, initial encounter: Secondary | ICD-10-CM | POA: Diagnosis not present

## 2019-10-25 DIAGNOSIS — M549 Dorsalgia, unspecified: Secondary | ICD-10-CM | POA: Insufficient documentation

## 2019-10-25 DIAGNOSIS — Z7901 Long term (current) use of anticoagulants: Secondary | ICD-10-CM | POA: Diagnosis not present

## 2019-10-25 DIAGNOSIS — Z96653 Presence of artificial knee joint, bilateral: Secondary | ICD-10-CM | POA: Insufficient documentation

## 2019-10-25 DIAGNOSIS — S2241XA Multiple fractures of ribs, right side, initial encounter for closed fracture: Secondary | ICD-10-CM

## 2019-10-25 DIAGNOSIS — I509 Heart failure, unspecified: Secondary | ICD-10-CM | POA: Diagnosis not present

## 2019-10-25 DIAGNOSIS — Z79899 Other long term (current) drug therapy: Secondary | ICD-10-CM | POA: Insufficient documentation

## 2019-10-25 DIAGNOSIS — J811 Chronic pulmonary edema: Secondary | ICD-10-CM | POA: Diagnosis not present

## 2019-10-25 DIAGNOSIS — S299XXA Unspecified injury of thorax, initial encounter: Secondary | ICD-10-CM | POA: Diagnosis present

## 2019-10-25 DIAGNOSIS — J9601 Acute respiratory failure with hypoxia: Secondary | ICD-10-CM

## 2019-10-25 DIAGNOSIS — E119 Type 2 diabetes mellitus without complications: Secondary | ICD-10-CM | POA: Insufficient documentation

## 2019-10-25 DIAGNOSIS — Z8673 Personal history of transient ischemic attack (TIA), and cerebral infarction without residual deficits: Secondary | ICD-10-CM | POA: Insufficient documentation

## 2019-10-25 DIAGNOSIS — I110 Hypertensive heart disease with heart failure: Secondary | ICD-10-CM | POA: Insufficient documentation

## 2019-10-25 DIAGNOSIS — I700 Atherosclerosis of aorta: Secondary | ICD-10-CM | POA: Diagnosis not present

## 2019-10-25 DIAGNOSIS — F419 Anxiety disorder, unspecified: Secondary | ICD-10-CM | POA: Diagnosis not present

## 2019-10-25 DIAGNOSIS — I2721 Secondary pulmonary arterial hypertension: Secondary | ICD-10-CM | POA: Insufficient documentation

## 2019-10-25 DIAGNOSIS — Z87891 Personal history of nicotine dependence: Secondary | ICD-10-CM | POA: Insufficient documentation

## 2019-10-25 DIAGNOSIS — Z20822 Contact with and (suspected) exposure to covid-19: Secondary | ICD-10-CM | POA: Insufficient documentation

## 2019-10-25 DIAGNOSIS — Z7984 Long term (current) use of oral hypoglycemic drugs: Secondary | ICD-10-CM | POA: Insufficient documentation

## 2019-10-25 DIAGNOSIS — S199XXA Unspecified injury of neck, initial encounter: Secondary | ICD-10-CM | POA: Insufficient documentation

## 2019-10-25 DIAGNOSIS — Z95810 Presence of automatic (implantable) cardiac defibrillator: Secondary | ICD-10-CM | POA: Diagnosis not present

## 2019-10-25 DIAGNOSIS — M797 Fibromyalgia: Secondary | ICD-10-CM | POA: Insufficient documentation

## 2019-10-25 DIAGNOSIS — W01198A Fall on same level from slipping, tripping and stumbling with subsequent striking against other object, initial encounter: Secondary | ICD-10-CM | POA: Diagnosis not present

## 2019-10-25 LAB — BLOOD GAS, VENOUS
Acid-Base Excess: 6.5 mmol/L — ABNORMAL HIGH (ref 0.0–2.0)
Bicarbonate: 35.7 mmol/L — ABNORMAL HIGH (ref 20.0–28.0)
O2 Saturation: 56.6 %
Patient temperature: 37
pCO2, Ven: 76 mmHg (ref 44.0–60.0)
pH, Ven: 7.28 (ref 7.250–7.430)
pO2, Ven: 34 mmHg (ref 32.0–45.0)

## 2019-10-25 LAB — CBC WITH DIFFERENTIAL/PLATELET
Abs Immature Granulocytes: 0.07 10*3/uL (ref 0.00–0.07)
Basophils Absolute: 0.1 10*3/uL (ref 0.0–0.1)
Basophils Relative: 1 %
Eosinophils Absolute: 0.1 10*3/uL (ref 0.0–0.5)
Eosinophils Relative: 2 %
HCT: 38.4 % (ref 36.0–46.0)
Hemoglobin: 11.9 g/dL — ABNORMAL LOW (ref 12.0–15.0)
Immature Granulocytes: 1 %
Lymphocytes Relative: 14 %
Lymphs Abs: 1.2 10*3/uL (ref 0.7–4.0)
MCH: 30.4 pg (ref 26.0–34.0)
MCHC: 31 g/dL (ref 30.0–36.0)
MCV: 98.2 fL (ref 80.0–100.0)
Monocytes Absolute: 0.6 10*3/uL (ref 0.1–1.0)
Monocytes Relative: 7 %
Neutro Abs: 6.8 10*3/uL (ref 1.7–7.7)
Neutrophils Relative %: 75 %
Platelets: 208 10*3/uL (ref 150–400)
RBC: 3.91 MIL/uL (ref 3.87–5.11)
RDW: 13.7 % (ref 11.5–15.5)
WBC: 8.9 10*3/uL (ref 4.0–10.5)
nRBC: 0 % (ref 0.0–0.2)

## 2019-10-25 LAB — COMPREHENSIVE METABOLIC PANEL
ALT: 11 U/L (ref 0–44)
AST: 19 U/L (ref 15–41)
Albumin: 3.5 g/dL (ref 3.5–5.0)
Alkaline Phosphatase: 58 U/L (ref 38–126)
Anion gap: 7 (ref 5–15)
BUN: 11 mg/dL (ref 8–23)
CO2: 32 mmol/L (ref 22–32)
Calcium: 8.5 mg/dL — ABNORMAL LOW (ref 8.9–10.3)
Chloride: 103 mmol/L (ref 98–111)
Creatinine, Ser: 0.67 mg/dL (ref 0.44–1.00)
GFR calc Af Amer: >60 mL/min (ref >60)
GFR calc non Af Amer: >60 mL/min (ref >60)
Glucose, Bld: 177 mg/dL — ABNORMAL HIGH (ref 70–99)
Potassium: 4 mmol/L (ref 3.5–5.1)
Sodium: 142 mmol/L (ref 135–145)
Total Bilirubin: 0.6 mg/dL (ref 0.3–1.2)
Total Protein: 7.1 g/dL (ref 6.5–8.1)

## 2019-10-25 LAB — TROPONIN I (HIGH SENSITIVITY): Troponin I (High Sensitivity): 10 ng/L (ref <18)

## 2019-10-25 LAB — LACTIC ACID, PLASMA: Lactic Acid, Venous: 1.1 mmol/L (ref 0.5–1.9)

## 2019-10-25 LAB — MAGNESIUM: Magnesium: 1.8 mg/dL (ref 1.7–2.4)

## 2019-10-25 LAB — PROCALCITONIN: Procalcitonin: <0.1 ng/mL

## 2019-10-25 LAB — FIBRIN DERIVATIVES D-DIMER (ARMC ONLY): Fibrin derivatives D-dimer (ARMC): 1446.86 ng/mL (FEU) — ABNORMAL HIGH (ref 0.00–499.00)

## 2019-10-25 LAB — PROTIME-INR
INR: 1.1 (ref 0.8–1.2)
Prothrombin Time: 13.8 seconds (ref 11.4–15.2)

## 2019-10-25 LAB — ETHANOL: Alcohol, Ethyl (B): <10 mg/dL (ref <10)

## 2019-10-25 LAB — SARS CORONAVIRUS 2 BY RT PCR (HOSPITAL ORDER, PERFORMED IN ~~LOC~~ HOSPITAL LAB): SARS Coronavirus 2: NEGATIVE

## 2019-10-25 LAB — SAMPLE TO BLOOD BANK

## 2019-10-25 LAB — BRAIN NATRIURETIC PEPTIDE: B Natriuretic Peptide: 186.5 pg/mL — ABNORMAL HIGH (ref 0.0–100.0)

## 2019-10-25 MED ORDER — ACETAMINOPHEN 500 MG PO TABS
1000.0000 mg | ORAL_TABLET | Freq: Once | ORAL | Status: AC
  Administered 2019-10-25: 1000 mg via ORAL
  Filled 2019-10-25: qty 2

## 2019-10-25 MED ORDER — FENTANYL CITRATE (PF) 100 MCG/2ML IJ SOLN
INTRAMUSCULAR | Status: DC
Start: 2019-10-25 — End: 2019-10-25
  Filled 2019-10-25: qty 2

## 2019-10-25 MED ORDER — FENTANYL CITRATE (PF) 100 MCG/2ML IJ SOLN
50.0000 ug | Freq: Once | INTRAMUSCULAR | Status: AC
  Administered 2019-10-25: 50 ug via INTRAVENOUS
  Filled 2019-10-25: qty 2

## 2019-10-25 MED ORDER — IOHEXOL 350 MG/ML SOLN
100.0000 mL | Freq: Once | INTRAVENOUS | Status: AC | PRN
  Administered 2019-10-25: 100 mL via INTRAVENOUS

## 2019-10-25 MED ORDER — IPRATROPIUM-ALBUTEROL 0.5-2.5 (3) MG/3ML IN SOLN
9.0000 mL | Freq: Once | RESPIRATORY_TRACT | Status: AC
  Administered 2019-10-25: 9 mL via RESPIRATORY_TRACT
  Filled 2019-10-25: qty 9

## 2019-10-25 MED ORDER — IPRATROPIUM-ALBUTEROL 0.5-2.5 (3) MG/3ML IN SOLN: 3.0000 mL | Freq: Once | RESPIRATORY_TRACT | Status: DC

## 2019-10-25 MED ORDER — FENTANYL CITRATE (PF) 100 MCG/2ML IJ SOLN
50.0000 ug | Freq: Once | INTRAMUSCULAR | Status: AC
  Administered 2019-10-25: 50 ug via INTRAVENOUS

## 2019-10-25 NOTE — ED Notes (Signed)
Report called to Charlesetta Shanks ED charge nurse
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
## 2019-10-25 NOTE — ED Provider Notes (Signed)
[facility] Emergency Department Provider Note  ____________________________________________   First MD Initiated Contact with Patient 10/25/19 [phone]     (approximate)  I have reviewed the triage vital signs and the nursing notes.   HISTORY  Chief Complaint Fall, Back Pain, and Shortness of Breath   HPI [name] is a 79 y.o. female with a past medical history of A. fib anticoagulated on Eliquis with an AICD in place, CHF, TIA, HTN, HDL, and DM who presents for assessment via EMS from home after a fall that occurred earlier today.  Patient is not exactly sure how she fell but thinks he may have slipped or tripped and fell onto her right back.  She states she has been falling more over the last several weeks and has had approximately 1 week of worsening shortness of breath.  She denies any headache, earache, sore throat, vomiting, diarrhea, dysuria, rash, or extremity pain.  She denies chest pain does endorse cough and some dyspnea.  No clear alleviating aggravating factors.  Patient does not wear oxygen at home.  Patient denies tobacco abuse or illicit drug use.         Past Medical History:  Diagnosis Date  . AICD (automatic cardioverter/defibrillator) present    PLACED BY  DR  Graciela Husbands 2008  . Angina    5-6 YRS AGO  . Anxiety   . Arthritis   . Atrial fibrillation (HCC)   . CHF (congestive heart failure) (HCC)   . Complication of anesthesia    states at times had a tough time waking up  . Diabetes mellitus   . Diverticulum of esophagus    large diverticulum in the middle third of the esophagus [facility] EGD '09)  . Dysrhythmia    A-FIB  . Fibromyalgia   . Headache(784.0)   . Heartburn   . History of stomach ulcers   . History of transient ischemic attack (TIA)    LAST ONE IN  2005  IN  [place], [place]  . HLD (hyperlipidemia)   . Hypertension   . Shortness of breath    EASING OFF NOW  . Sleep apnea    DOESN'T KNOW SETTINGS  . Stroke (cerebrum)  (HCC)   . TIA (transient ischemic attack)     Patient Active Problem List   Diagnosis Date Noted  . Overdose of coumadin, accidental or unintentional, initial encounter 07/29/2019  . 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 Piriformis syndrome 06/18/2014  . Facet syndrome, lumbar 06/18/2014  . 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 DDD (degenerative disc disease), lumbar 06/18/2014  . DDD (degenerative disc disease), cervical 06/08/2014  . 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 Cervical spinal stenosis 04/09/2011    Past Surgical History:  Procedure Laterality Date  . ANTERIOR CERVICAL DECOMP/DISCECTOMY FUSION  04/09/2011   Procedure: ANTERIOR CERVICAL DECOMPRESSION/DISCECTOMY FUSION 1 LEVEL;  Surgeon: Temple Pacini, MD;  Location: [facility];  Service: Neurosurgery;  Laterality: N/A;  Cervical five-six Anterior Cervical Decompression and Fusion  . APPENDECTOMY    . BILATERAL OOPHORECTOMY     BENIGN  . CARDIAC CATHETERIZATION     X 2    LAST ONE  2009  . CESAREAN SECTION     X  4  . CHOLECYSTECTOMY    . IMPLANTABLE CARDIOVERTER DEFIBRILLATOR (ICD) GENERATOR CHANGE Left 01/18/2019   Procedure: ICD GENERATOR CHANGE;  Surgeon: Marcina Millard, MD;  Location: [facility] ORS;  Service: Cardiovascular;  Laterality: Left;  .  INSERT / REPLACE / REMOVE PACEMAKER     ST  JUDE  DEVICE  . JOINT REPLACEMENT     BIL  KNEES  . TONSILLECTOMY    . TUBAL LIGATION      Prior to Admission medications   Medication Sig Start Date End Date Taking? Authorizing Provider  alendronate (FOSAMAX) 70 MG tablet Take 70 mg by mouth every Monday.     [provider]  carvedilol (COREG) 12.5 MG tablet Take 12.5 mg by mouth 2 (two) times daily with a meal.    [provider]  Cholecalciferol (VITAMIN D-1000 MAX ST) 1000 units tablet Take 1,000 Units by mouth daily.     [provider]  Cyanocobalamin (RA VITAMIN B-12 TR) 1000 MCG TBCR Take  1,000 mcg by mouth daily.     [provider]  diazepam (VALIUM) 2 MG tablet Take 2 mg by mouth 2 (two) times daily. 09/21/19   [provider]  ELIQUIS 5 MG TABS tablet Take 5 mg by mouth 2 (two) times daily. 09/21/19   [provider]  furosemide (LASIX) 20 MG tablet Take 40 mg by mouth daily.     [provider]  gabapentin (NEURONTIN) 100 MG capsule Take 200 mg by mouth 3 (three) times daily.  11/30/13   [provider]  meclizine (ANTIVERT) 25 MG tablet Take 25 mg by mouth 3 (three) times daily as needed for dizziness.    [provider]  metFORMIN (GLUCOPHAGE) 500 MG tablet Take 500 mg by mouth 2 (two) times daily with a meal.    [provider]  Multiple Vitamins-Minerals (MULTIVITAMIN WITH MINERALS) tablet Take 1 tablet by mouth daily.    [provider]  nortriptyline (PAMELOR) 10 MG capsule Take 10-20 mg by mouth See admin instructions. Take 1 capsule (10mg ) by mouth nightly for 1 week then take 2 capsules (20mg ) by mouth nightly 07/27/19   [provider]  omeprazole (PRILOSEC) 20 MG capsule Take 20 mg by mouth daily.    [provider]  pioglitazone (ACTOS) 15 MG tablet Take 15 mg by mouth daily.    [provider]  quinapril (ACCUPRIL) 40 MG tablet Take 40 mg by mouth daily.     [provider]  rosuvastatin (CRESTOR) 10 MG tablet Take 10 mg by mouth daily.    [provider]  sertraline (ZOLOFT) 100 MG tablet Take 100 mg by mouth daily.    [provider]  triamcinolone cream (KENALOG) 0.1 % Apply 1 application topically daily as needed (Wound).  01/06/17   [provider]    Allergies Ciprofloxacin hcl, Dopamine, Sulfa antibiotics, Sulfacetamide sodium, and Tegaderm ag mesh [silver]  Family History  Problem Relation Age of Onset  . Kidney disease Neg Hx   . Bladder Cancer Neg Hx     Social History Social History   Tobacco Use  . Smoking  status: Former Smoker    Packs/day: 1.00    Years: 20.00    Pack years: 20.00    Types: Cigarettes    Quit date: 03/19/1991    Years since quitting: 28.6  . Smokeless tobacco: Never Used  Vaping Use  . Vaping Use: Never used  Substance Use Topics  . Alcohol use: No  . Drug use: No    Review of Systems  Review of Systems  Constitutional: Negative for chills and fever.  HENT: Negative for sore throat.   Eyes: Negative for pain.  Respiratory: Positive for cough and  shortness of breath. Negative for stridor.   Cardiovascular: Negative for chest pain.  Gastrointestinal: Negative for vomiting.  Genitourinary: Negative for dysuria.  Musculoskeletal: Positive for back pain, falls and myalgias.  Skin: Negative for rash.  Neurological: Negative for seizures, loss of consciousness and headaches.  Psychiatric/Behavioral: Negative for suicidal ideas.  All other systems reviewed and are negative.     ____________________________________________   PHYSICAL EXAM:  VITAL SIGNS: ED Triage Vitals  Enc Vitals Group     BP      Pulse      Resp      Temp      Temp src      SpO2      Weight      Height      Head Circumference      Peak Flow      Pain Score      Pain Loc      Pain Edu?      Excl. in GC?    Vitals:   10/25/19 0323  BP: (!) 155/92  Pulse: 70  Resp: (!) 29  SpO2: 96%   Physical Exam Vitals and nursing note reviewed.  Constitutional:      General: She is not in acute distress.    Appearance: She is well-developed. She is obese. She is ill-appearing.  HENT:     Head: Normocephalic and atraumatic.     Right Ear: External ear normal.     Left Ear: External ear normal.     Nose: Nose normal.  Eyes:     Conjunctiva/sclera: Conjunctivae normal.  Cardiovascular:     Rate and Rhythm: Normal rate and regular rhythm.     Heart sounds: No murmur heard.   Pulmonary:     Effort: Pulmonary effort is normal. Tachypnea present. No respiratory distress.     Breath  sounds: Normal breath sounds.  Abdominal:     Palpations: Abdomen is soft.     Tenderness: There is no abdominal tenderness.  Musculoskeletal:        General: Tenderness present.     Cervical back: Neck supple.  Skin:    General: Skin is warm and dry.     Capillary Refill: Capillary refill takes less than 2 seconds.  Neurological:     Mental Status: She is alert and oriented to person, place, and time.  Psychiatric:        Mood and Affect: Mood normal.     Tenderness palpation over the right upper and mid back.  No tenderness over the C/T/L-spine.  PERRLA.  EOMI.  Cranial nerves II through XII grossly intact.  Patient has symmetric strength in her bilateral upper and lower extremities.  Sensation is intact light touch in all extremities.  2+ bilateral radial and DP pulses.  ____________________________________________   LABS (all labs ordered are listed, but only abnormal results are displayed)  Labs Reviewed  CBC WITH DIFFERENTIAL/PLATELET - Abnormal; Notable for the following components:      Result Value   Hemoglobin 11.9 (*)    All other components within normal limits  BRAIN NATRIURETIC PEPTIDE - Abnormal; Notable for the following components:   B Natriuretic Peptide 186.5 (*)    All other components within normal limits  BLOOD GAS, VENOUS - Abnormal; Notable for the following components:   pCO2, Ven 76 (*)    Bicarbonate 35.7 (*)    Acid-Base Excess 6.5 (*)    All other components within normal limits  COMPREHENSIVE METABOLIC PANEL -  Abnormal; Notable for the following components:   Glucose, Bld 177 (*)    Calcium 8.5 (*)    All other components within normal limits  FIBRIN DERIVATIVES D-DIMER ([facility] ONLY) - Abnormal; Notable for the following components:   Fibrin derivatives D-dimer [facility]) 1,446.86 (*)    All other components within normal limits  SARS CORONAVIRUS 2 BY RT PCR (HOSPITAL ORDER, PERFORMED IN [facility])  LACTIC ACID, PLASMA  ETHANOL   MAGNESIUM  PROTIME-INR  PROCALCITONIN  SAMPLE TO BLOOD BANK  SAMPLE TO BLOOD BANK  TROPONIN I (HIGH SENSITIVITY)  TROPONIN I (HIGH SENSITIVITY)   ____________________________________________  EKG  Sinus rhythm with a ventricular rate of 70, prolonged QTc interval at 514, prolonged QRS at 140 which is nonspecific as well as nonspecific ST changes in the inferior leads and anterior leads with likely artifact in leads I and aVL. ____________________________________________  RADIOLOGY  ED MD interpretation: Some central vascular congestion without focal consolidation, effusion, significant edema, or clear rib fracture.  Official radiology report(s): DG Chest 1 View  Result Date: 10/25/2019 CLINICAL DATA:  Shortness of breath following fall, initial encounter EXAM: CHEST  1 VIEW COMPARISON:  07/22/2019 FINDINGS: Cardiac shadow is enlarged. Defibrillator and pacing device are again seen and stable. Postsurgical changes in the cervical spine are seen. The lungs are well aerated bilaterally. Mild central vascular congestion is seen. No acute bony abnormality is noted. No effusion is seen. IMPRESSION: Mild central vascular congestion without other acute abnormality. Electronically Signed   By: Alcide Clever M.D.   On: 10/25/2019 03:28   CT HEAD WO CONTRAST  Result Date: 10/25/2019 CLINICAL DATA:  Cerebral hemorrhage, fall, head injury EXAM: CT HEAD WITHOUT CONTRAST TECHNIQUE: Contiguous axial images were obtained from the base of the skull through the vertex without intravenous contrast. COMPARISON:  None. FINDINGS: Brain: Normal anatomic configuration. Parenchymal volume loss is commensurate with the patient's age. Mild periventricular white matter changes are present likely reflecting the sequela of small vessel ischemia. No abnormal intra or extra-axial mass lesion or fluid collection. No abnormal mass effect or midline shift. No evidence of acute intracranial hemorrhage or infarct. Ventricular  size is normal. Cerebellum unremarkable. Vascular: No asymmetric hyperdense vasculature at the skull base. Skull: Intact Sinuses/Orbits: Mild mucosal thickening within the left sphenoid sinus. Remaining paranasal sinuses are clear. Orbits are unremarkable. Other: Mastoid air cells and middle ear cavities are clear. IMPRESSION: No acute intracranial abnormality. Mild left sphenoid sinus disease. Electronically Signed   By: Helyn Numbers MD   On: 10/25/2019 06:13   CT CERVICAL SPINE WO CONTRAST  Result Date: 10/25/2019 CLINICAL DATA:  Fall, neck injury EXAM: CT CERVICAL SPINE WITHOUT CONTRAST TECHNIQUE: Multidetector CT imaging of the cervical spine was performed without intravenous contrast. Multiplanar CT image reconstructions were also generated. COMPARISON:  None. FINDINGS: Alignment: Anterior cervical discectomy infusion of C5-6 with instrumentation has been performed with solid incorporation of interbody bone graft. There is straightening of the cervical spine. No listhesis. Skull base and vertebrae: Craniocervical junction is unremarkable. Atlantal dental interval is normal. No acute fracture of the cervical spine. No lytic or blastic bone lesion is seen. Soft tissues and spinal canal: No prevertebral fluid or swelling. No visible canal hematoma. Disc levels: Review of the sagittal reformats demonstrates intervertebral disc space narrowing and endplate remodeling at C4-5 in keeping with changes of moderate degenerative disc disease at this level. Remaining intervertebral disc heights are preserved. Vertebral body heights are preserved. Review of the axial images demonstrates  solid incorporation of interbody bone graft resulting in mild bilateral neural foraminal narrowing at C5-6. Upper chest: Unremarkable Other: None significant IMPRESSION: No acute fracture or listhesis of the cervical spine. Solid fusion C5-6. Electronically Signed   By: Helyn Numbers MD   On: 10/25/2019 06:45   CT CHEST ABDOMEN  PELVIS W CONTRAST  Result Date: 10/25/2019 CLINICAL DATA:  Fall, chronic anticoagulation, right back pain EXAM: CT CHEST, ABDOMEN, AND PELVIS WITH CONTRAST TECHNIQUE: Multidetector CT imaging of the chest, abdomen and pelvis was performed following the standard protocol during bolus administration of intravenous contrast. CONTRAST:  OMNIPAQUE IOHEXOL 350 MG/ML SOLN COMPARISON:  01/20/2013 FINDINGS: CT CHEST FINDINGS Cardiovascular: Mild global cardiomegaly. Extensive coronary artery calcification. Extensive senescent calcification of the aortic valve leaflets. No pericardial effusion. Central pulmonary arteries are enlarged in keeping with changes of pulmonary arterial hypertension. Left subclavian 3 lead pacemaker is seen with leads within the right atrium, right ventricle, and left ventricular outflow mild atherosclerotic calcification is seen within the thoracic aorta. The thoracic aorta is of normal caliber. Mediastinum/Nodes: Thyroid unremarkable. No pathologic thoracic adenopathy. Esophagus unremarkable. Lungs/Pleura: Trace interstitial pulmonary edema. No confluent pulmonary infiltrates. No pneumothorax or pleural effusion. The central airways are widely patent. Musculoskeletal: Acute fractures of the right 6, 7, 8 ribs posterolaterally. CT ABDOMEN PELVIS FINDINGS Hepatobiliary: Status post cholecystectomy. Liver unremarkable. No intra or extrahepatic biliary ductal dilation. Pancreas: Unremarkable Spleen: Multiple low-attenuation lesions are seen within the spleen which have slightly increased in size and number since remote prior examination these are indeterminate, however, there interval growth favors a lymphomatous lesion, such as a marginal zone lymphoma, or a littoral cell angioma. Metastatic disease could also appear similarly if there is a history of primary malignancy. Finally, an inflammatory conditions such as sarcoidosis could result in such an appearance. The spleen is not enlarged.  Adrenals/Urinary Tract: The adrenal glands are unremarkable. The kidneys are normal in size and position. Multiple simple cortical cysts are seen within the left kidney. The kidneys are otherwise unremarkable. The bladder contains a punctate focus of gas which is nonspecific and may be related to recent catheterization peer Stomach/Bowel: Stomach, small bowel, are unremarkable. Severe descending and sigmoid colonic diverticulosis without superimposed inflammatory change. The large bowel is otherwise unremarkable. Appendix normal. No free intraperitoneal gas or fluid. Vascular/Lymphatic: Moderate aortoiliac atherosclerotic calcification without evidence of aneurysm. No pathologic adenopathy within the abdomen and pelvis. Reproductive: Uterus and bilateral adnexa are unremarkable. Other: Rectum unremarkable Musculoskeletal: Degenerative changes are seen within the lumbar spine. IMPRESSION: Acute fractures of the right 6th, 7th, and 8th ribs with minimal displacement. No pneumothorax. Trace interstitial pulmonary edema, likely cardiogenic in nature. Extensive coronary artery calcification. Extensive calcification of the aortic valve leaflets. Pulmonary arterial hypertension. Multiple low-attenuation lesions within the spleen demonstrating slight interval growth since remote prior examination. Given their slow growth, differential considerations are as listed above. Aortic Atherosclerosis (ICD10-I70.0). Electronically Signed   By: Helyn Numbers MD   On: 10/25/2019 06:28   CT T-SPINE NO CHARGE  Result Date: 10/25/2019 CLINICAL DATA:  Fall, right back pain EXAM: CT THORACIC AND LUMBAR SPINE WITHOUT CONTRAST TECHNIQUE: Multidetector CT imaging of the thoracic and lumbar spine was performed without contrast. Multiplanar CT image reconstructions were also generated. COMPARISON:  None. FINDINGS: CT THORACIC SPINE FINDINGS Alignment: Mild thoracic levoscoliosis. Normal thoracic kyphosis. No listhesis. Vertebrae: Vertebral  body height has been preserved. No acute fracture of the thoracic spine Paraspinal and other soft tissues: The paraspinal soft tissues are unremarkable.  No canal hematoma. Incidentally noted is trace interstitial pulmonary edema, likely cardiogenic in nature. Disc levels: Review of the sagittal images demonstrates diffuse endplate remodeling throughout the mid and lower thoracic spine in keeping with changes of mild to moderate degenerative disc disease. Review of the axial images demonstrates no significant neural foraminal narrowing or canal stenosis. CT LUMBAR SPINE FINDINGS Segmentation: 5 non rib-bearing segments to the lumbar spine. Alignment: Normal lumbar lordosis.  No listhesis. Vertebrae: Vertebral body height has been preserved. No acute fracture of the lumbar spine. No lytic or blastic bone lesion Paraspinal and other soft tissues: The paraspinal soft tissues are unremarkable Disc levels: Review of the sagittal reformats demonstrates diffuse intervertebral disc space narrowing and endplate remodeling throughout the lumbar spine with vacuum disc phenomena at L4-5 and L5-S1 in keeping with changes of moderate to severe degenerative disc disease. Review of the axial images demonstrates a broad-based disc bulge at L4-5 resulting in mild central canal stenosis in combination with hypertrophy of the lamina propria. There is mild effacement of the caudal aspect the neural foramina bilaterally without neural impingement of the exiting L4 nerve roots. IMPRESSION: CT THORACIC SPINE IMPRESSION No acute fracture or listhesis CT LUMBAR SPINE IMPRESSION No acute fracture or listhesis Electronically Signed   By: Helyn Numbers MD   On: 10/25/2019 06:39   CT L-SPINE NO CHARGE  Result Date: 10/25/2019 CLINICAL DATA:  Fall, right back pain EXAM: CT THORACIC AND LUMBAR SPINE WITHOUT CONTRAST TECHNIQUE: Multidetector CT imaging of the thoracic and lumbar spine was performed without contrast. Multiplanar CT image  reconstructions were also generated. COMPARISON:  None. FINDINGS: CT THORACIC SPINE FINDINGS Alignment: Mild thoracic levoscoliosis. Normal thoracic kyphosis. No listhesis. Vertebrae: Vertebral body height has been preserved. No acute fracture of the thoracic spine Paraspinal and other soft tissues: The paraspinal soft tissues are unremarkable. No canal hematoma. Incidentally noted is trace interstitial pulmonary edema, likely cardiogenic in nature. Disc levels: Review of the sagittal images demonstrates diffuse endplate remodeling throughout the mid and lower thoracic spine in keeping with changes of mild to moderate degenerative disc disease. Review of the axial images demonstrates no significant neural foraminal narrowing or canal stenosis. CT LUMBAR SPINE FINDINGS Segmentation: 5 non rib-bearing segments to the lumbar spine. Alignment: Normal lumbar lordosis.  No listhesis. Vertebrae: Vertebral body height has been preserved. No acute fracture of the lumbar spine. No lytic or blastic bone lesion Paraspinal and other soft tissues: The paraspinal soft tissues are unremarkable Disc levels: Review of the sagittal reformats demonstrates diffuse intervertebral disc space narrowing and endplate remodeling throughout the lumbar spine with vacuum disc phenomena at L4-5 and L5-S1 in keeping with changes of moderate to severe degenerative disc disease. Review of the axial images demonstrates a broad-based disc bulge at L4-5 resulting in mild central canal stenosis in combination with hypertrophy of the lamina propria. There is mild effacement of the caudal aspect the neural foramina bilaterally without neural impingement of the exiting L4 nerve roots. IMPRESSION: CT THORACIC SPINE IMPRESSION No acute fracture or listhesis CT LUMBAR SPINE IMPRESSION No acute fracture or listhesis Electronically Signed   By: Helyn Numbers MD   On: 10/25/2019 06:39     ____________________________________________   PROCEDURES  Procedure(s) performed (including Critical Care):  .1-3 Lead EKG Interpretation Performed by: Gilles Chiquito, MD Authorized by: Gilles Chiquito, MD     Interpretation: normal     ECG rate assessment: normal     Rhythm: sinus rhythm     Ectopy:  none     Conduction: normal    .Critical Care Performed by: Gilles Chiquito, MD Authorized by: Gilles Chiquito, MD   Critical care provider statement:    Critical care time (minutes):  45   Critical care time was exclusive of:  Separately billable procedures and treating other patients   Critical care was necessary to treat or prevent imminent or life-threatening deterioration of the following conditions:  Respiratory failure   Critical care was time spent personally by me on the following activities:  Discussions with consultants, evaluation of patient's response to treatment, examination of patient, ordering and performing treatments and interventions, ordering and review of laboratory studies, ordering and review of radiographic studies, pulse oximetry, re-evaluation of patient's condition, obtaining history from patient or surrogate and review of old charts     ____________________________________________   INITIAL IMPRESSION / ASSESSMENT AND PLAN / ED COURSE        Patient presents with [place] to history exam for assessment of right back pain after mechanical fall earlier today.  This is in the setting of several weeks of worsening shortness of breath.  Patient is noted to require 2 L of oxygen which is new O2 requirement for her as she had an SPO2 of 89% on room air.  Patient is noted to be tachypneic with otherwise stable vital signs.  Remainder of exam as above.  Trauma work-up remarkable for nondisplaced contiguous rib fractures on the right 6-8.  No evidence of other visceral injury or orthopedic injury.  Patient is neurovascular intact in extremities have low  suspicion for occult injury at this time.  In addition patient is known to be slightly hypercarbic while this is likely related to decreased tidal volume secondary to pain from her rib fractures given she does endorse some history of asthma and has some diminished breath sounds a trial of bronchodilators was attempted which per patient subjectively resulted in significant improvement in her shortness of breath.  She was also given below noted analgesia.  Given acute hypoxic respiratory failure in the setting of contiguous rib fractures and patient's age I did reach out to [facility] as patient will benefit from trauma admission.  Patient accepted by [facility].  Accepting physician is Dr. Glo Herring.   ____________________________________________   FINAL CLINICAL IMPRESSION(S) / ED DIAGNOSES  Final diagnoses:  Fall  Acute respiratory failure with hypoxia (HCC)  Closed fracture of multiple ribs of right side, initial encounter    Medications  ipratropium-albuterol (DUONEB) 0.5-2.5 (3) MG/3ML nebulizer solution 9 mL (9 mLs Nebulization Given 10/25/19 0604)  iohexol (OMNIPAQUE) 350 MG/ML injection 100 mL (100 mLs Intravenous Contrast Given 10/25/19 0502)  fentaNYL (SUBLIMAZE) injection 50 mcg (50 mcg Intravenous Given 10/25/19 0559)  acetaminophen (TYLENOL) tablet 1,000 mg (1,000 mg Oral Given 10/25/19 0601)     ED Discharge Orders    None       Note:  This document was prepared using Dragon voice recognition software and may include unintentional dictation errors.   Gilles Chiquito, MD 10/25/19 [id number]

## 2019-10-25 NOTE — ED Notes (Signed)
Pt transferred to [facility] via [facility] 

## 2020-01-24 DIAGNOSIS — I350 Nonrheumatic aortic (valve) stenosis: Secondary | ICD-10-CM | POA: Insufficient documentation

## 2020-01-26 ENCOUNTER — Other Ambulatory Visit: Payer: Self-pay

## 2020-01-26 ENCOUNTER — Encounter: Payer: Self-pay | Admitting: Physical Therapy

## 2020-01-26 ENCOUNTER — Ambulatory Visit: Payer: Medicare Other | Attending: Neurology | Admitting: Physical Therapy

## 2020-01-26 DIAGNOSIS — R296 Repeated falls: Secondary | ICD-10-CM

## 2020-01-26 DIAGNOSIS — R42 Dizziness and giddiness: Secondary | ICD-10-CM | POA: Diagnosis not present

## 2020-01-26 DIAGNOSIS — M6281 Muscle weakness (generalized): Secondary | ICD-10-CM | POA: Insufficient documentation

## 2020-01-26 DIAGNOSIS — R262 Difficulty in walking, not elsewhere classified: Secondary | ICD-10-CM | POA: Insufficient documentation

## 2020-01-26 NOTE — Therapy (Signed)
[place] [facility] MAIN [facility] SERVICES [address] [place], [place], [address] Phone: [phone]   Fax:  [phone]  Physical Therapy Evaluation  Patient Details  Name: [name] MRN: [id number] Date of Birth: 12/13/1940 Referring Provider (PT): Dr. Malvin Johns   Encounter Date: 01/26/2020   PT End of Session - 01/26/20 1400    Visit Number 1    Date for PT Re-Evaluation 03/22/20    Authorization Type eval date: 01/26/20    PT Start Time 1401    PT Stop Time 1514    PT Time Calculation (min) 73 min    Equipment Utilized During Treatment Gait belt    Activity Tolerance Patient tolerated treatment well    Behavior During Therapy [facility] for tasks assessed/performed           Past Medical History:  Diagnosis Date  . AICD (automatic cardioverter/defibrillator) present    PLACED BY  DR  Graciela Husbands 2008  . Angina    5-6 YRS AGO  . Anxiety   . Arthritis   . Atrial fibrillation (HCC)   . CHF (congestive heart failure) (HCC)   . Complication of anesthesia    states at times had a tough time waking up  . Diabetes mellitus   . Diverticulum of esophagus    large diverticulum in the middle third of the esophagus [facility] EGD '09)  . Dysrhythmia    A-FIB  . Fibromyalgia   . Headache(784.0)   . Heartburn   . History of stomach ulcers   . History of transient ischemic attack (TIA)    LAST ONE IN  2005  IN  [place], [place]  . HLD (hyperlipidemia)   . Hypertension   . Shortness of breath    EASING OFF NOW  . Sleep apnea    DOESN'T KNOW SETTINGS  . Stroke (cerebrum) (HCC)   . TIA (transient ischemic attack)     Past Surgical History:  Procedure Laterality Date  . ANTERIOR CERVICAL DECOMP/DISCECTOMY FUSION  04/09/2011   Procedure: ANTERIOR CERVICAL DECOMPRESSION/DISCECTOMY FUSION 1 LEVEL;  Surgeon: Temple Pacini, MD;  Location: [facility] OR[facility];  Service: Neurosurgery;  Laterality: N/A;  Cervical five-six Anterior Cervical Decompression and Fusion  .  APPENDECTOMY    . BILATERAL OOPHORECTOMY     BENIGN  . CARDIAC CATHETERIZATION     X 2    LAST ONE  2009  . CESAREAN SECTION     X  4  . CHOLECYSTECTOMY    . IMPLANTABLE CARDIOVERTER DEFIBRILLATOR (ICD) GENERATOR CHANGE Left 01/18/2019   Procedure: ICD GENERATOR CHANGE;  Surgeon: Marcina Millard, MD;  Location: [facility] ORS;  Service: Cardiovascular;  Laterality: Left;  . INSERT / REPLACE / REMOVE PACEMAKER     ST  JUDE  DEVICE  . JOINT REPLACEMENT     BIL  KNEES  . TONSILLECTOMY    . TUBAL LIGATION      There were no vitals filed for this visit.    Subjective Assessment - 01/27/20 0827    Subjective Patient states that she is getting vertigo every time she tries to get out of bed in the morning and when she goes to lie down in bed at night.    Pertinent History Patient reports that she has had episodes of vertigo and dizziness "all my life" and that she has experienced vertiginous episodes multiple times. Patient states her sister also experiences vertiginous episodes. Patient reports in July she began to have daily episodes of vertigo and  dizziness and reports the episodes of lessened now, but that she continues to experience vertigo daily when she gets up out of bed and when she lies down at night in bed. Patient reports that in the past she would take Meclizine and her vertigo would usually subside in a few days, but states this time the vertigo has persisted. Patient states she gets vertigo that lasts seconds. Patients symptoms are motion provoked, positional and intermittent. Patient reports she tries to avoid quick head turns and bending over and back up as this movements bring on her symptoms and that staying still and taking the Meclizine help to ease her symptoms. Patient reports she has seen an ENT pyhsician in the past for dizziness and was prescribed Meclizine. She reports she has not had any vestibular testing. Patient reports she has fallen 22 times this past year and states  the last time she broke three ribs. Patient states the falls were do to her knees buckling and also due to neuropathy. Patient reports she is a limited household walker and uses a rollator for ambulation. Patient is on 2 L of oxygen all of the time. Patient reports that she lives in a Senior Apartment community on the first floor. Patient states the distance to walk to the communal dining room is too far so staff have been bringing her meals to her. Patient reports that she misses the socialization of being able to get out of her room and would like a power wheelchair.    Limitations Walking;Standing;House hold activities    Patient Stated Goals to have decreased vertigo and dizziness and improved balance            [facility] PT Assessment - 01/27/20 0828      Assessment   Medical Diagnosis dizziness and giddiness/ falls    Referring Provider (PT) Dr. Malvin Johns    Prior Therapy no prior vestibular therapy      Precautions   Precautions Fall      Restrictions   Weight Bearing Restrictions No      Balance Screen   Has the patient fallen in the past 6 months Yes    How many times? pt reports 22 falls this year    Has the patient had a decrease in activity level because of a fear of falling?  Yes    Is the patient reluctant to leave their home because of a fear of falling?  No      Home Environment   Living Environment Other (Comment)    Additional Comments Senior Living Apartments      Prior Function   Level of Independence Independent with household mobility with device;Independent with basic ADLs;Requires assistive device for independence      Cognition   Overall Cognitive Status Within Functional Limits for tasks assessed      Observation/Other Assessments   Focus on Therapeutic Outcomes (FOTO)  48/100    Other Surveys  Dizziness Handicap Inventory [facility]);Activities of Balance and Confidence Scale    Activities of Balance Confidence Scale (ABC Scale)  32.5%    Dizziness Handicap  Inventory (DHI)  60/100           VESTIBULAR AND BALANCE EVALUATION  HISTORY:  Subjective history of current problem: Patient reports that she has had episodes of vertigo and dizziness "all my life" and that she has experienced vertiginous episodes multiple times. Patient states her sister also experiences vertiginous episodes. Patient reports in July she began to have daily episodes of vertigo and  dizziness and reports the episodes of lessened now, but that she continues to experience vertigo daily when she gets up out of bed and when she lies down at night in bed. Patient reports that in the past she would take Meclizine and her vertigo would usually subside in a few days, but states this time the vertigo has persisted. Patient states she gets vertigo that lasts seconds. Patients symptoms are motion provoked, positional and intermittent. Patient reports she tries to avoid quick head turns and bending over and back up as this movements bring on her symptoms and that staying still and taking the Meclizine help to ease her symptoms. Patient reports that she has been modifying her movements to try to avoid brining on her symptoms and that she tries to move more slowly and to not turn her head much. Patient reports she has seen an ENT pyhsician in the past for dizziness and was prescribed Meclizine. She reports she has not had any vestibular testing. Patient reports she has fallen 22 times this past year and states the last time she broke three ribs. Patient states the falls were do to her knees buckling and also due to neuropathy. Patient reports she is a limited household walker and uses a rollator for ambulation. Patient is on 2 L of oxygen all of the time. Patient reports that she lives in a Senior Apartment community on the first floor. Patient states the distance to walk to the communal dining room is too far so staff have been bringing her meals to her. Patient reports that she misses the socialization  of being able to get out of her room and would like a power wheelchair.   Progression of symptoms: better History of similar episodes:  Falls (yes/no): yes Number of falls in past 6 months:   Auditory complaints (tinnitus, pain, drainage): tinnitus chronic in both ears Vision (last eye exam, diplopia, recent changes): left eye seems to have a film over it and states she went to the eye doctor and he said he fixed it but he didn't.   Current Symptoms: (dysarthria, dysphagia, drop attacks, bowel and bladder changes, recent weight loss/gain)  Patient reports she lost and then gained back 20 pounds.  Patient reports she had diarrhea and states she does not have any control over her bladder. Patient reports she has spoken with her doctor in regards to these symptoms.   EXAMINATION  POSTURE:  Rounded shoulders  SOMATOSENSORY:  Any N & T in extremities or weakness: decreased sensation to light touch left  Lateral lower left leg, ankle and feet as compared to right.        Sensation          Intact      Diminished         Absent  Light touch  Lower extremities knee to feet; left decreased more than right         COORDINATION: deferred  MUSCULOSKELETAL SCREEN: Cervical Spine ROM: AROM cervical spine WFL   Functional Mobility: Patient requires min A with sit to/from sup and with rolling on mat table.  Gait: Patient arrives is Styxis chair and required Min A to transfer from chair to/from mat table.   Balance: Deferred  POSTURAL CONTROL TESTS:  Clinical Test of Sensory Interaction for Balance (CTSIB): deferred  OCULOMOTOR / VESTIBULAR TESTING: Oculomotor Exam- Room Light  Normal Abnormal Comments  Ocular Alignment N    Ocular ROM   Deferred secondary to positive left DH test  Spontaneous Nystagmus   Deferred secondary to positive left DH test    Gaze evoked Nystagmus   Deferred secondary to positive left DH test    Smooth Pursuit   Deferred secondary to positive left DH test     Saccades   Deferred secondary to positive left DH test    VOR   Deferred secondary to positive left DH test    VOR Cancellation   Deferred secondary to positive left DH test    Left Head Impulse   Deferred secondary to positive left DH test    Right Head Impulse   Deferred secondary to positive left DH test    Head Shaking Nystagmus   Deferred secondary to positive left DH test    Static Acuity   Deferred secondary to positive left DH test    Dynamic Acuity   Deferred secondary to positive left DH test     BPPV TESTS:  Symptoms Duration Intensity Nystagmus  Left Dix-Hallpike vertigo 10-15 seconds 6/10 Left upbeating, torsional nystagmus of short duration about 10-15 seconds without latency  Right Dix-Hallpike None  N/A None observed   FUNCTIONAL OUTCOME MEASURES:  Results Comments  DHI 60/100 Moderate perception of handicap; in need of intervention  ABC Scale 32.5% High Falls risk; in need of intervention  FOTO 48/100 Given the patient's risk adjustment variables, like-patients nationally had a FS score of  45/100 at intake   Canalith Repositioning Maneuver: On mat table, performed left Dix-Hallpike testing and it was positive with left upbeating torsional nystagmus of short duration without latency. Performed left Epley/canalith repositioning maneuver (CRT) with 1 minute holds in each position. Repeated left CRT for a total of 2 maneuvers. Patient did not have nystagmus and denied vertigo with second maneuver but performed Epley maneuver to try to obtain full clearance of particles.  Patient reported 6/10 vertigo with first maneuver.    PT Education - 01/26/20 1400    Education Details discussed BPPV and Epley maneuver; showed YouTube video of positive BPPV nystagmus and provided BPPV handout materials from [email or web address]    Person(s) Educated Patient    Methods Explanation;Handout;Verbal cues;Tactile cues    Comprehension Verbalized understanding            PT Short  Term Goals - 01/27/20 0844      PT SHORT TERM GOAL #1   Title Patient will report 50% or greater improvement in her symptoms of dizziness and imbalance with provoking motions or positions.    Baseline reports vertigo when she lies down and when she gets out of bed daily;    Time 4    Period Weeks    Status New    Target Date 02/23/20             PT Long Term Goals - 01/27/20 0843      PT LONG TERM GOAL #1   Title Patient will have improved FOTO score of 5 points or greater in order to demonstrate improvements in patient's ADLs and functional performance.    Baseline scored 48/100 on 01/26/20;    Time 8    Period Weeks    Status New    Target Date 03/22/20      PT LONG TERM GOAL #2   Title Patient reports no vertigo with provoking motions or positions.    Time 8    Period Weeks    Status New    Target Date 03/22/20      PT LONG TERM GOAL #3  Title Patient will reduce perceived disability to low levels as indicated by <40 on Dizziness Handicap Inventory.    Baseline scored 60/100 on 01/26/20;    Time 8    Period Weeks    Status New    Target Date 03/22/20      PT LONG TERM GOAL #4   Title Patient will reduce falls risk as indicated by Activities Specific Balance Confidence Scale (ABC) >67%.    Baseline scored 32.5% on 01/26/20;    Time 8    Period Weeks    Target Date 03/22/20            Plan - 01/27/20 0813    Clinical Impression Statement Patient presents to clinic with reports of daily episodes of vertigo whenever she tries to get into or out bed with supine to/from sitting. Patient states she has had difficulties on and off with vertigo all her life. Patient reports 22 falls this year. Patient reports that she is limited household ambulator with rollator walker. Patient with positive left Dix-Hallpike test this date with left upbeating torsional nystagmus of short duration without latency that could be indicative of left BPPV. Patient was treated with two Epley  maenuvers this date. Patient is at high risk of falls as evidenced by fall history, score of 32.5% on the ABC scale and score of 48/100 on the FOTO assessment. Patient presents with listed functional deficits and would benefit from vestibular PT services to address deficits and goals.    Personal Factors and Comorbidities Comorbidity 3+;Time since onset of injury/illness/exacerbation;Past/Current Experience;Fitness    Comorbidities anxiety, DM, CHF, TIA/Stroke, HTN, sleep apnea, neuropathy    Examination-Activity Limitations Bend;Bed Mobility;Reach Overhead;Locomotion Level;Transfers;Stairs;Stand    Examination-Participation Restrictions Cleaning    Stability/Clinical Decision Making Evolving/Moderate complexity    Clinical Decision Making Moderate    Rehab Potential Good    PT Frequency 1x / week    PT Duration 8 weeks    PT Treatment/Interventions ADLs/Self Care Home Management;Canalith Repostioning;Gait training;Stair training;Functional mobility training;Therapeutic activities;Therapeutic exercise;Balance training;Neuromuscular re-education;Patient/family education;Vestibular    PT Next Visit Plan repeat Dix-Hallpike testing and perform treatment maneuvers if indicated; once BPPV is cleared complete vestibulo-ocular testing, modified CTSIB, coordination, balance and strength testing.    Consulted and Agree with Plan of Care Patient           Patient will benefit from skilled therapeutic intervention in order to improve the following deficits and impairments:  Difficulty walking,Cardiopulmonary status limiting activity,Dizziness,Decreased endurance,Decreased activity tolerance,Decreased balance,Decreased mobility,Decreased strength,Impaired sensation  Visit Diagnosis: Dizziness and giddiness  Repeated falls  Muscle weakness (generalized)  Difficulty in walking, not elsewhere classified     Problem List Patient Active Problem List   Diagnosis Date Noted  . Overdose of coumadin,  accidental or unintentional, initial encounter 07/29/2019  . 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 Piriformis syndrome 06/18/2014  . Facet syndrome, lumbar 06/18/2014  . 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 DDD (degenerative disc disease), lumbar 06/18/2014  . DDD (degenerative disc disease), cervical 06/08/2014  . 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 Cervical spinal stenosis 04/09/2011   Mardelle Matteorriea Shantasia Hunnell PT, DPT [phone] Mardelle Matteorriea Kabrea Seeney 01/27/2020, 8:52 AM  [facility] [address] [place], [place], [id number] Phone: [phone]   Fax:  [phone]  Name: [name] MRN: [id number] Date of Birth: 04/14/1940

## 2020-02-02 ENCOUNTER — Encounter: Payer: Self-pay | Admitting: Physical Therapy

## 2020-02-02 ENCOUNTER — Ambulatory Visit: Payer: Medicare Other | Admitting: Physical Therapy

## 2020-02-02 ENCOUNTER — Other Ambulatory Visit: Payer: Self-pay

## 2020-02-02 DIAGNOSIS — R42 Dizziness and giddiness: Secondary | ICD-10-CM

## 2020-02-02 DIAGNOSIS — R262 Difficulty in walking, not elsewhere classified: Secondary | ICD-10-CM

## 2020-02-02 DIAGNOSIS — M6281 Muscle weakness (generalized): Secondary | ICD-10-CM

## 2020-02-02 DIAGNOSIS — R296 Repeated falls: Secondary | ICD-10-CM

## 2020-02-02 NOTE — Therapy (Signed)
[facility] MAIN [facility] SERVICES [address] [place], [place], [address] Phone: [phone]   Fax:  [phone]  Physical Therapy Treatment  Patient Details  Name: [name] MRN: [id number] Date of Birth: August 08, 1940 Referring Provider (PT): Dr. Malvin Johns   Encounter Date: 02/02/2020   PT End of Session - 02/02/20 1605    Visit Number 2    Date for PT Re-Evaluation 03/22/20    Authorization Type eval date: 01/26/20    PT Start Time 1522    PT Stop Time 1558    PT Time Calculation (min) 36 min    Equipment Utilized During Treatment Gait belt    Activity Tolerance Patient tolerated treatment well    Behavior During Therapy [facility] for tasks assessed/performed           Past Medical History:  Diagnosis Date  . AICD (automatic cardioverter/defibrillator) present    PLACED BY  DR  Graciela Husbands 2008  . Angina    5-6 YRS AGO  . Anxiety   . Arthritis   . Atrial fibrillation (HCC)   . CHF (congestive heart failure) (HCC)   . Complication of anesthesia    states at times had a tough time waking up  . Diabetes mellitus   . Diverticulum of esophagus    large diverticulum in the middle third of the esophagus [facility] EGD '09)  . Dysrhythmia    A-FIB  . Fibromyalgia   . Headache(784.0)   . Heartburn   . History of stomach ulcers   . History of transient ischemic attack (TIA)    LAST ONE IN  2005  IN  [place], [place]  . HLD (hyperlipidemia)   . Hypertension   . Shortness of breath    EASING OFF NOW  . Sleep apnea    DOESN'T KNOW SETTINGS  . Stroke (cerebrum) (HCC)   . TIA (transient ischemic attack)     Past Surgical History:  Procedure Laterality Date  . ANTERIOR CERVICAL DECOMP/DISCECTOMY FUSION  04/09/2011   Procedure: ANTERIOR CERVICAL DECOMPRESSION/DISCECTOMY FUSION 1 LEVEL;  Surgeon: Temple Pacini, MD;  Location: [facility];  Service: Neurosurgery;  Laterality: N/A;  Cervical five-six Anterior Cervical Decompression and Fusion  .  APPENDECTOMY    . BILATERAL OOPHORECTOMY     BENIGN  . CARDIAC CATHETERIZATION     X 2    LAST ONE  2009  . CESAREAN SECTION     X  4  . CHOLECYSTECTOMY    . IMPLANTABLE CARDIOVERTER DEFIBRILLATOR (ICD) GENERATOR CHANGE Left 01/18/2019   Procedure: ICD GENERATOR CHANGE;  Surgeon: Marcina Millard, MD;  Location: [facility] ORS;  Service: Cardiovascular;  Laterality: Left;  . INSERT / REPLACE / REMOVE PACEMAKER     ST  JUDE  DEVICE  . JOINT REPLACEMENT     BIL  KNEES  . TONSILLECTOMY    . TUBAL LIGATION      There were no vitals filed for this visit.   Subjective Assessment - 02/02/20 1604    Subjective Patient states she had vertigo for two days after last session and then it eased off.    Pertinent History Patient reports that she has had episodes of vertigo and dizziness "all my life" and that she has experienced vertiginous episodes multiple times. Patient states her sister also experiences vertiginous episodes. Patient reports in July she began to have daily episodes of vertigo and dizziness and reports the episodes of lessened now, but that she continues to experience vertigo  daily when she gets up out of bed and when she lies down at night in bed. Patient reports that in the past she would take Meclizine and her vertigo would usually subside in a few days, but states this time the vertigo has persisted. Patient states she gets vertigo that lasts seconds. Patients symptoms are motion provoked, positional and intermittent. Patient reports she tries to avoid quick head turns and bending over and back up as this movements bring on her symptoms and that staying still and taking the Meclizine help to ease her symptoms. Patient reports she has seen an ENT pyhsician in the past for dizziness and was prescribed Meclizine. She reports she has not had any vestibular testing. Patient reports she has fallen 22 times this past year and states the last time she broke three ribs. Patient states the falls  were do to her knees buckling and also due to neuropathy. Patient reports she is a limited household walker and uses a rollator for ambulation. Patient is on 2 L of oxygen all of the time. Patient reports that she lives in a [facility] community on the first floor. Patient states the distance to walk to the communal dining room is too far so staff have been bringing her meals to her. Patient reports that she misses the socialization of being able to get out of her room and would like a power wheelchair.    Limitations Walking;Standing;House hold activities    Patient Stated Goals to have decreased vertigo and dizziness and improved balance           Canalith Repositioning Maneuver: Patient required Min A/CGA of 2 with transfer from [facility] chair to/from mat table and assistance with bed mobility.  On inverted mat table, performed left Dix-Hallpike testing and it was positive with left upbeating torsional nystagmus of short duration without latency. Performed left canalith repositioning maneuver (CRT). Repeated left Dix-Hallpike test and it was negative with no nystagmus observed and patient denied vertigo, but completed the maneuver to help ensure clearance of particles. Note: held each maneuver position for 1 minute each.  Patient reported vertigo when rolling onto her right side during the maneuvers.  Patient reported 4/10 vertigo with first and denied vertigo with second maneuver.  On inverted mat table performed right Dix-Hallpike testing and it was positive with right upbeating torsional nystagmus of short duration.  Performed right canalith repositioning maneuver (CRT) with one minute holds in each position. Patient reported 5/10 vertigo.    PT Education - 02/02/20 1604    Education Details discussed BPPV and Epley maneuver    Person(s) Educated Patient    Methods Explanation;Verbal cues    Comprehension Verbalized understanding            PT Short Term Goals - 01/27/20 0844       PT SHORT TERM GOAL #1   Title Patient will report 50% or greater improvement in her symptoms of dizziness and imbalance with provoking motions or positions.    Baseline reports vertigo when she lies down and when she gets out of bed daily;    Time 4    Period Weeks    Status New    Target Date 02/23/20             PT Long Term Goals - 01/27/20 0843      PT LONG TERM GOAL #1   Title Patient will have improved FOTO score of 5 points or greater in order to demonstrate improvements in patient's  ADLs and functional performance.    Baseline scored 48/100 on 01/26/20;    Time 8    Period Weeks    Status New    Target Date 03/22/20      PT LONG TERM GOAL #2   Title Patient reports no vertigo with provoking motions or positions.    Time 8    Period Weeks    Status New    Target Date 03/22/20      PT LONG TERM GOAL #3   Title Patient will reduce perceived disability to low levels as indicated by <40 on Dizziness Handicap Inventory.    Baseline scored 60/100 on 01/26/20;    Time 8    Period Weeks    Status New    Target Date 03/22/20      PT LONG TERM GOAL #4   Title Patient will reduce falls risk as indicated by Activities Specific Balance Confidence Scale (ABC) >67%.    Baseline scored 32.5% on 01/26/20;    Time 8    Period Weeks    Target Date 03/22/20                 Plan - 02/02/20 1605    Clinical Impression Statement Patient with positive left and right Dix-Hallpike tests this date. Performed 2 Epley maneuvers for left ear and one for the right ear. Will plan on re-testing Dix-Hallpike tests and performing maneuvers if indicated next session. Patient would benefit from continued vestibular PT services to further address vertigo symptoms, functional deficits and goals.    Personal Factors and Comorbidities Comorbidity 3+;Time since onset of injury/illness/exacerbation;Past/Current Experience;Fitness    Comorbidities anxiety, DM, CHF, TIA/Stroke, HTN, sleep  apnea, neuropathy    Examination-Activity Limitations Bend;Bed Mobility;Reach Overhead;Locomotion Level;Transfers;Stairs;Stand    Examination-Participation Restrictions Cleaning    Stability/Clinical Decision Making Evolving/Moderate complexity    Rehab Potential Good    PT Frequency 1x / week    PT Duration 8 weeks    PT Treatment/Interventions ADLs/Self Care Home Management;Canalith Repostioning;Gait training;Stair training;Functional mobility training;Therapeutic activities;Therapeutic exercise;Balance training;Neuromuscular re-education;Patient/family education;Vestibular    PT Next Visit Plan repeat Dix-Hallpike testing and perform treatment maneuvers if indicated; once BPPV is cleared complete vestibulo-ocular testing, modified CTSIB, coordination, balance and strength testing.    Consulted and Agree with Plan of Care Patient           Patient will benefit from skilled therapeutic intervention in order to improve the following deficits and impairments:  Difficulty walking,Cardiopulmonary status limiting activity,Dizziness,Decreased endurance,Decreased activity tolerance,Decreased balance,Decreased mobility,Decreased strength,Impaired sensation  Visit Diagnosis: Dizziness and giddiness  Repeated falls  Muscle weakness (generalized)  Difficulty in walking, not elsewhere classified     Problem List Patient Active Problem List   Diagnosis Date Noted  . Overdose of coumadin, accidental or unintentional, initial encounter 07/29/2019  . 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 Piriformis syndrome 06/18/2014  . Facet syndrome, lumbar 06/18/2014  . 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 DDD (degenerative disc disease), lumbar 06/18/2014  . DDD (degenerative disc disease), cervical 06/08/2014  . 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 Cervical spinal stenosis 04/09/2011   Mardelle Matte PT, DPT [phone] Mardelle Matte 02/02/2020, 4:08  PM  [facility] MAIN [facility] [address] [place], [place], [address] Phone: [phone]   Fax:  [phone]  Name: [name] MRN: [id number] Date of Birth: Jun 13, 1940

## 2020-02-07 ENCOUNTER — Ambulatory Visit: Payer: Medicare Other | Admitting: Physical Therapy

## 2020-02-21 ENCOUNTER — Encounter: Payer: Medicare Other | Admitting: Physical Therapy

## 2020-02-28 ENCOUNTER — Ambulatory Visit: Payer: Medicare Other | Attending: Neurology | Admitting: Physical Therapy

## 2020-02-28 ENCOUNTER — Other Ambulatory Visit: Payer: Self-pay

## 2020-02-28 DIAGNOSIS — R296 Repeated falls: Secondary | ICD-10-CM

## 2020-02-28 DIAGNOSIS — R42 Dizziness and giddiness: Secondary | ICD-10-CM | POA: Diagnosis present

## 2020-02-28 DIAGNOSIS — R262 Difficulty in walking, not elsewhere classified: Secondary | ICD-10-CM | POA: Insufficient documentation

## 2020-02-28 DIAGNOSIS — M6281 Muscle weakness (generalized): Secondary | ICD-10-CM | POA: Diagnosis present

## 2020-02-28 NOTE — Therapy (Signed)
[facility] MAIN [facility] SERVICES [address] [place], [place], [address] Phone: [phone]   Fax:  [phone]  Physical Therapy Treatment  Patient Details  Name: [name] MRN: [id number] Date of Birth: Feb 07, 1940 Referring Provider (PT): Dr. Malvin Johns   Encounter Date: 02/28/2020   PT End of Session - 02/28/20 1502    Visit Number 3    Date for PT Re-Evaluation 03/22/20    Authorization Type eval date: 01/26/20    PT Start Time 1502    PT Stop Time 1537    PT Time Calculation (min) 35 min    Equipment Utilized During Treatment Gait belt    Activity Tolerance Patient tolerated treatment well    Behavior During Therapy [facility] for tasks assessed/performed           Past Medical History:  Diagnosis Date  . AICD (automatic cardioverter/defibrillator) present    PLACED BY  DR  Graciela Husbands 2008  . Angina    5-6 YRS AGO  . Anxiety   . Arthritis   . Atrial fibrillation (HCC)   . CHF (congestive heart failure) (HCC)   . Complication of anesthesia    states at times had a tough time waking up  . Diabetes mellitus   . Diverticulum of esophagus    large diverticulum in the middle third of the esophagus [facility] EGD '09)  . Dysrhythmia    A-FIB  . Fibromyalgia   . Headache(784.0)   . Heartburn   . History of stomach ulcers   . History of transient ischemic attack (TIA)    LAST ONE IN  2005  IN  [place], [place]  . HLD (hyperlipidemia)   . Hypertension   . Shortness of breath    EASING OFF NOW  . Sleep apnea    DOESN'T KNOW SETTINGS  . Stroke (cerebrum) (HCC)   . TIA (transient ischemic attack)     Past Surgical History:  Procedure Laterality Date  . ANTERIOR CERVICAL DECOMP/DISCECTOMY FUSION  04/09/2011   Procedure: ANTERIOR CERVICAL DECOMPRESSION/DISCECTOMY FUSION 1 LEVEL;  Surgeon: Temple Pacini, MD;  Location: [facility];  Service: Neurosurgery;  Laterality: N/A;  Cervical five-six Anterior Cervical Decompression and Fusion  . APPENDECTOMY     . BILATERAL OOPHORECTOMY     BENIGN  . CARDIAC CATHETERIZATION     X 2    LAST ONE  2009  . CESAREAN SECTION     X  4  . CHOLECYSTECTOMY    . IMPLANTABLE CARDIOVERTER DEFIBRILLATOR (ICD) GENERATOR CHANGE Left 01/18/2019   Procedure: ICD GENERATOR CHANGE;  Surgeon: Marcina Millard, MD;  Location: [facility] ORS;  Service: Cardiovascular;  Laterality: Left;  . INSERT / REPLACE / REMOVE PACEMAKER     ST  JUDE  DEVICE  . JOINT REPLACEMENT     BIL  KNEES  . TONSILLECTOMY    . TUBAL LIGATION      There were no vitals filed for this visit.   Subjective Assessment - 02/28/20 1541    Subjective Patient states that she had dizziness sensation that lasted three days after last session, but then she started to feel better. Patient states that she thinks that she has still been getting some brief vertigo each day when she lies down and when she gets up from bed.    Pertinent History Patient reports that she has had episodes of vertigo and dizziness "all my life" and that she has experienced vertiginous episodes multiple times. Patient states her sister  also experiences vertiginous episodes. Patient reports in July she began to have daily episodes of vertigo and dizziness and reports the episodes of lessened now, but that she continues to experience vertigo daily when she gets up out of bed and when she lies down at night in bed. Patient reports that in the past she would take Meclizine and her vertigo would usually subside in a few days, but states this time the vertigo has persisted. Patient states she gets vertigo that lasts seconds. Patients symptoms are motion provoked, positional and intermittent. Patient reports she tries to avoid quick head turns and bending over and back up as this movements bring on her symptoms and that staying still and taking the Meclizine help to ease her symptoms. Patient reports she has seen an ENT pyhsician in the past for dizziness and was prescribed Meclizine. She  reports she has not had any vestibular testing. Patient reports she has fallen 22 times this past year and states the last time she broke three ribs. Patient states the falls were do to her knees buckling and also due to neuropathy. Patient reports she is a limited household walker and uses a rollator for ambulation. Patient is on 2 L of oxygen all of the time. Patient reports that she lives in a Senior Apartment community on the first floor. Patient states the distance to walk to the communal dining room is too far so staff have been bringing her meals to her. Patient reports that she misses the socialization of being able to get out of her room and would like a power wheelchair.    Limitations Walking;Standing;House hold activities    Patient Stated Goals to have decreased vertigo and dizziness and improved balance           Canalith Repositioning Maneuver: On inverted mat table performed right Dix-Hallpike test and it was negative with no nystagmus observed and patient denied vertigo.  On inverted mat table, performed left Dix-Hallpike testing and no nystagmus observed, but patient reported sensation for first few seconds like the vertigo was about to come on; therefore, performed one   left canalith repositioning maneuver/Epley maneuver. Patient denied symptoms during Epley maneuver and no nystagmus was observed.  Note: rehab tech present to assist with positioning for Epley maneuver.    PT Education - 02/28/20 1542    Education Details discussed plan of care    Person(s) Educated Patient    Methods Explanation    Comprehension Verbalized understanding            PT Short Term Goals - 01/27/20 0844      PT SHORT TERM GOAL #1   Title Patient will report 50% or greater improvement in her symptoms of dizziness and imbalance with provoking motions or positions.    Baseline reports vertigo when she lies down and when she gets out of bed daily;    Time 4    Period Weeks    Status New     Target Date 02/23/20             PT Long Term Goals - 01/27/20 0843      PT LONG TERM GOAL #1   Title Patient will have improved FOTO score of 5 points or greater in order to demonstrate improvements in patient's ADLs and functional performance.    Baseline scored 48/100 on 01/26/20;    Time 8    Period Weeks    Status New    Target Date 03/22/20  PT LONG TERM GOAL #2   Title Patient reports no vertigo with provoking motions or positions.    Time 8    Period Weeks    Status New    Target Date 03/22/20      PT LONG TERM GOAL #3   Title Patient will reduce perceived disability to low levels as indicated by <40 on Dizziness Handicap Inventory.    Baseline scored 60/100 on 01/26/20;    Time 8    Period Weeks    Status New    Target Date 03/22/20      PT LONG TERM GOAL #4   Title Patient will reduce falls risk as indicated by Activities Specific Balance Confidence Scale (ABC) >67%.    Baseline scored 32.5% on 01/26/20;    Time 8    Period Weeks    Target Date 03/22/20                 Plan - 02/28/20 1542    Clinical Impression Statement Patient with negative right Dix-Hallpike test this date with no nystagmus observed and patient denied dizziness. Patient reported sensation that she felt like the vertigo was coming on when in left Dix-Hallpike position that lasted only a second or two, but no nystagmus was observed. Performed one left canalith repositioning manuever during which patient denied symptoms and no nystagmus was observed. Patient would benefit from continued PT services to further address goals. Will plan on repeating functional outcome testing and repeating Dix-Hallpike tests next session.    Personal Factors and Comorbidities Comorbidity 3+;Time since onset of injury/illness/exacerbation;Past/Current Experience;Fitness    Comorbidities anxiety, DM, CHF, TIA/Stroke, HTN, sleep apnea, neuropathy    Examination-Activity Limitations Bend;Bed Mobility;Reach  Overhead;Locomotion Level;Transfers;Stairs;Stand    Examination-Participation Restrictions Cleaning    Stability/Clinical Decision Making Evolving/Moderate complexity    Rehab Potential Good    PT Frequency 1x / week    PT Duration 8 weeks    PT Treatment/Interventions ADLs/Self Care Home Management;Canalith Repostioning;Gait training;Stair training;Functional mobility training;Therapeutic activities;Therapeutic exercise;Balance training;Neuromuscular re-education;Patient/family education;Vestibular    PT Next Visit Plan repeat Dix-Hallpike testing and perform treatment maneuvers if indicated; once BPPV is cleared complete vestibulo-ocular testing, modified CTSIB, coordination, balance and strength testing.    Consulted and Agree with Plan of Care Patient           Patient will benefit from skilled therapeutic intervention in order to improve the following deficits and impairments:  Difficulty walking,Cardiopulmonary status limiting activity,Dizziness,Decreased endurance,Decreased activity tolerance,Decreased balance,Decreased mobility,Decreased strength,Impaired sensation  Visit Diagnosis: Dizziness and giddiness  Repeated falls  Muscle weakness (generalized)  Difficulty in walking, not elsewhere classified     Problem List Patient Active Problem List   Diagnosis Date Noted  . Overdose of coumadin, accidental or unintentional, initial encounter 07/29/2019  . 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 Piriformis syndrome 06/18/2014  . Facet syndrome, lumbar 06/18/2014  . 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 DDD (degenerative disc disease), lumbar 06/18/2014  . DDD (degenerative disc disease), cervical 06/08/2014  . 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 Cervical spinal stenosis 04/09/2011   Mardelle Matte PT, DPT [phone] Mardelle Matte 02/28/2020, 3:51 PM  [place] [facility] MAIN [facility] SERVICES [address] [place], [place], [address] Phone: [phone]   Fax:  [phone]  Name: [name] MRN: [id number] Date of Birth: [birth year]

## 2020-03-06 ENCOUNTER — Ambulatory Visit: Payer: Medicare Other | Admitting: Physical Therapy

## 2020-03-12 ENCOUNTER — Ambulatory Visit: Payer: Medicare Other | Admitting: Physical Therapy

## 2020-03-19 ENCOUNTER — Other Ambulatory Visit: Payer: Self-pay

## 2020-03-19 ENCOUNTER — Ambulatory Visit: Payer: Medicare Other | Attending: Neurology | Admitting: Physical Therapy

## 2020-03-19 ENCOUNTER — Encounter: Payer: Self-pay | Admitting: Physical Therapy

## 2020-03-19 DIAGNOSIS — M6281 Muscle weakness (generalized): Secondary | ICD-10-CM | POA: Diagnosis present

## 2020-03-19 DIAGNOSIS — R42 Dizziness and giddiness: Secondary | ICD-10-CM

## 2020-03-19 DIAGNOSIS — R262 Difficulty in walking, not elsewhere classified: Secondary | ICD-10-CM

## 2020-03-19 DIAGNOSIS — R296 Repeated falls: Secondary | ICD-10-CM | POA: Diagnosis present

## 2020-03-19 NOTE — Therapy (Signed)
[facility] MAIN [facility] SERVICES [address] [place], [place], [address] Phone: [phone]   Fax:  [phone]  Physical Therapy Treatment/Re-certification Dates of Service: 01/26/20-03/19/20 Visit Number: 4  Patient Details  Name: [name] MRN: [id number] Date of Birth: [birth year] Referring Provider (PT): Dr. Malvin Johns   Encounter Date: 03/19/2020   PT End of Session - 03/19/20 1614    Visit Number 4    Number of Visits 18    Date for PT Re-Evaluation 06/11/20    Authorization Type eval date: 01/26/20    PT Start Time 1529    PT Stop Time 1559    PT Time Calculation (min) 30 min    Equipment Utilized During Treatment Gait belt    Activity Tolerance Patient tolerated treatment well    Behavior During Therapy [facility] for tasks assessed/performed           Past Medical History:  Diagnosis Date  . AICD (automatic cardioverter/defibrillator) present    PLACED BY  DR  Graciela Husbands 2008  . Angina    5-6 YRS AGO  . Anxiety   . Arthritis   . Atrial fibrillation (HCC)   . CHF (congestive heart failure) (HCC)   . Complication of anesthesia    states at times had a tough time waking up  . Diabetes mellitus   . Diverticulum of esophagus    large diverticulum in the middle third of the esophagus [facility] EGD '09)  . Dysrhythmia    A-FIB  . Fibromyalgia   . Headache(784.0)   . Heartburn   . History of stomach ulcers   . History of transient ischemic attack (TIA)    LAST ONE IN  2005  IN  [place], [place]  . HLD (hyperlipidemia)   . Hypertension   . Shortness of breath    EASING OFF NOW  . Sleep apnea    DOESN'T KNOW SETTINGS  . Stroke (cerebrum) (HCC)   . TIA (transient ischemic attack)     Past Surgical History:  Procedure Laterality Date  . ANTERIOR CERVICAL DECOMP/DISCECTOMY FUSION  04/09/2011   Procedure: ANTERIOR CERVICAL DECOMPRESSION/DISCECTOMY FUSION 1 LEVEL;  Surgeon: Temple Pacini, MD;  Location: [facility]S;  Service: Neurosurgery;   Laterality: N/A;  Cervical five-six Anterior Cervical Decompression and Fusion  . APPENDECTOMY    . BILATERAL OOPHORECTOMY     BENIGN  . CARDIAC CATHETERIZATION     X 2    LAST ONE  2009  . CESAREAN SECTION     X  4  . CHOLECYSTECTOMY    . IMPLANTABLE CARDIOVERTER DEFIBRILLATOR (ICD) GENERATOR CHANGE Left 01/18/2019   Procedure: ICD GENERATOR CHANGE;  Surgeon: Marcina Millard, MD;  Location: [facility] ORS;  Service: Cardiovascular;  Laterality: Left;  . INSERT / REPLACE / REMOVE PACEMAKER     ST  JUDE  DEVICE  . JOINT REPLACEMENT     BIL  KNEES  . TONSILLECTOMY    . TUBAL LIGATION      There were no vitals filed for this visit.   Subjective Assessment - 03/19/20 1612    Subjective Patient states that she has had a few episodes of vertigo since last seen 2 weeks ago. Patient states that the doctor decreased her blood pressure medication because her blood pressure was running too low. Patient states she has not noticed any other changes in her symptoms.    Pertinent History Patient reports that she has had episodes of vertigo and dizziness "all my life"  and that she has experienced vertiginous episodes multiple times. Patient states her sister also experiences vertiginous episodes. Patient reports in July she began to have daily episodes of vertigo and dizziness and reports the episodes of lessened now, but that she continues to experience vertigo daily when she gets up out of bed and when she lies down at night in bed. Patient reports that in the past she would take Meclizine and her vertigo would usually subside in a few days, but states this time the vertigo has persisted. Patient states she gets vertigo that lasts seconds. Patients symptoms are motion provoked, positional and intermittent. Patient reports she tries to avoid quick head turns and bending over and back up as this movements bring on her symptoms and that staying still and taking the Meclizine help to ease her symptoms. Patient  reports she has seen an ENT pyhsician in the past for dizziness and was prescribed Meclizine. She reports she has not had any vestibular testing. Patient reports she has fallen 22 times this past year and states the last time she broke three ribs. Patient states the falls were do to her knees buckling and also due to neuropathy. Patient reports she is a limited household walker and uses a rollator for ambulation. Patient is on 2 L of oxygen all of the time. Patient reports that she lives in a Senior [facility]ment community on the first floor. Patient states the distance to walk to the communal dining room is too far so staff have been bringing her meals to her. Patient reports that she misses the socialization of being able to get out of her room and would like a power wheelchair.    Limitations Walking;Standing;House hold activities    Patient Stated Goals to have decreased vertigo and dizziness and improved balance          Canalith Repositioning Maneuver: On inverted mat table performed right Dix-Hallpike testing and it was negative with no nystagmus noted and patient denied dizziness.    On inverted mat table performed left Dix-Hallpike testing and it was positive with left upbeating torsional nystagmus of short duration. Patient reported 4/10 vertigo.  Performed left canalith repositioning maneuver (CRT) and noted when rotating head from left rotation to right rotation, patient had a reversal of her nystagmus and observed right upbeating nystagmus of short duration that patient rated as 1/10 vertigo. Repeated left CRT for a total of 3 maneuvers with retesting between maneuvers. Note: performed 1 minute holds in each Epley treatment position for all 3 maneuvers.  Note: Patient denied vertigo and no further nystagmus was observed during second and third maneuver but performed to try to promote clearance of all particles.   Patient required CGA/Min A with transfer from [facility] chair to/from mat table and  Min A times 2 with transitioning from sitting edge of plinth table to bringing legs up onto plinth in long sitting position. Second person present during maneuver to assist with rolling and positioning.     PT Education - 03/19/20 1614    Education Details discussed Epley maneuver    Person(s) Educated Patient    Methods Explanation    Comprehension Verbalized understanding            PT Short Term Goals - 03/19/20 1636      PT SHORT TERM GOAL #1   Title Patient will report 50% or greater improvement in her symptoms of dizziness and imbalance with provoking motions or positions.    Baseline reports vertigo when she  lies down and when she gets out of bed daily;    Time 4    Period Weeks    Status On-going    Target Date 04/16/20             PT Long Term Goals - 03/19/20 1637      PT LONG TERM GOAL #1   Title Patient will have improved FOTO score of 5 points or greater in order to demonstrate improvements in patient's ADLs and functional performance.    Baseline scored 48/100 on 01/26/20;    Time 12    Period Weeks    Status On-going    Target Date 06/11/20      PT LONG TERM GOAL #2   Title Patient reports no vertigo with provoking motions or positions.    Time 12    Period Weeks    Status On-going    Target Date 06/11/20      PT LONG TERM GOAL #3   Title Patient will reduce perceived disability to low levels as indicated by <40 on Dizziness Handicap Inventory.    Baseline scored 60/100 on 01/26/20;    Time 12    Period Weeks    Status On-going    Target Date 06/11/20      PT LONG TERM GOAL #4   Title Patient will reduce falls risk as indicated by Activities Specific Balance Confidence Scale (ABC) >67%.    Baseline scored 32.5% on 01/26/20;    Time 12    Period Weeks    Status On-going    Target Date 06/11/20                 Plan - 03/19/20 1639    Clinical Impression Statement Patient returns to clinic after several missed weeks in January and  February. Patient reports that she has had a few episodes of vertigo since last being seen in the clinic on 02/28/20. Patient with positive left Dix-Hallpike test and performed 3 Epley maneuvers. Patient noted to have a reversal of nystagmus during the first Epley maneuver when moving from left head rotation to right head rotation position. Patient with negative right Dix-Hallpike test this date. Patient would benefit from continued vestibular rehab services to further address goals and plan of care. Will plan on repeating functional outcome measure testing next session and repeating canal testing.    Personal Factors and Comorbidities Comorbidity 3+;Time since onset of injury/illness/exacerbation;Past/Current Experience;Fitness    Comorbidities anxiety, DM, CHF, TIA/Stroke, HTN, sleep apnea, neuropathy    Examination-Activity Limitations Bend;Bed Mobility;Reach Overhead;Locomotion Level;Transfers;Stairs;Stand    Examination-Participation Restrictions Cleaning    Stability/Clinical Decision Making Evolving/Moderate complexity    Rehab Potential Good    PT Frequency 1x / week    PT Duration 8 weeks    PT Treatment/Interventions ADLs/Self Care Home Management;Canalith Repostioning;Gait training;Stair training;Functional mobility training;Therapeutic activities;Therapeutic exercise;Balance training;Neuromuscular re-education;Patient/family education;Vestibular    PT Next Visit Plan repeat Dix-Hallpike testing and perform treatment maneuvers if indicated; once BPPV is cleared complete vestibulo-ocular testing, modified CTSIB, coordination, balance and strength testing.    Consulted and Agree with Plan of Care Patient           Patient will benefit from skilled therapeutic intervention in order to improve the following deficits and impairments:  Difficulty walking,Cardiopulmonary status limiting activity,Dizziness,Decreased endurance,Decreased activity tolerance,Decreased balance,Decreased  mobility,Decreased strength,Impaired sensation  Visit Diagnosis: Dizziness and giddiness  Repeated falls  Muscle weakness (generalized)  Difficulty in walking, not elsewhere classified     Problem List  Patient Active Problem List   Diagnosis Date Noted  . Overdose of coumadin, accidental or unintentional, initial encounter 07/29/2019  . 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 Piriformis syndrome 06/18/2014  . Facet syndrome, lumbar 06/18/2014  . 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 DDD (degenerative disc disease), lumbar 06/18/2014  . DDD (degenerative disc disease), cervical 06/08/2014  . 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 Cervical spinal stenosis 04/09/2011    Mardelle Matteorriea Avrian Delfavero PT, DPT [phone] Mardelle Matteorriea Tijah Hane 03/19/2020, 4:43 PM  [place] [facility] [address] [place], [place], [address] Phone: [phone]   Fax:  [phone]  Name: Lemar LoftySandra J Deramo MRN: [id number] Date of Birth: 03/22/1940

## 2020-04-02 ENCOUNTER — Encounter: Payer: Self-pay | Admitting: Physical Therapy

## 2020-04-02 ENCOUNTER — Other Ambulatory Visit: Payer: Self-pay

## 2020-04-02 ENCOUNTER — Ambulatory Visit: Payer: Medicare Other | Admitting: Physical Therapy

## 2020-04-02 DIAGNOSIS — M6281 Muscle weakness (generalized): Secondary | ICD-10-CM

## 2020-04-02 DIAGNOSIS — R42 Dizziness and giddiness: Secondary | ICD-10-CM | POA: Diagnosis not present

## 2020-04-02 DIAGNOSIS — R262 Difficulty in walking, not elsewhere classified: Secondary | ICD-10-CM

## 2020-04-02 DIAGNOSIS — R296 Repeated falls: Secondary | ICD-10-CM

## 2020-04-02 NOTE — Therapy (Signed)
[place] MAIN [facility] SERVICES [address] [place], [place], [address] Phone: [phone]   Fax:  [phone]  Physical Therapy Treatment/Discharge Summary Dates of Service 01/26/20-04/02/20 Total Number of Visits: 5  Patient Details  Name: [name] MRN: [id number] Date of Birth: [birth year] Referring Provider (PT): Dr. Melrose Nakayama   Encounter Date: 04/02/2020   PT End of Session - 04/02/20 1350    Visit Number 5    Number of Visits 18    Date for PT Re-Evaluation 06/11/20    Authorization Type eval date: 01/26/20    PT Start Time 1302    PT Stop Time 1346    PT Time Calculation (min) 44 min    Equipment Utilized During Treatment Gait belt    Activity Tolerance Patient tolerated treatment well    Behavior During Therapy [facility] for tasks assessed/performed           Past Medical History:  Diagnosis Date  . AICD (automatic cardioverter/defibrillator) present    PLACED BY  DR  KLEIN 2008  . Angina    5-6 YRS AGO  . Anxiety   . Arthritis   . Atrial fibrillation ([place])   . CHF (congestive heart failure) ([place])   . Complication of anesthesia    states at times had a tough time waking up  . Diabetes mellitus   . Diverticulum of esophagus    large diverticulum in the middle third of the esophagus [facility] EGD '09)  . Dysrhythmia    A-FIB  . Fibromyalgia   . Headache(784.0)   . Heartburn   . History of stomach ulcers   . History of transient ischemic attack (TIA)    LAST ONE IN  2005  IN  [place], [place]  . HLD (hyperlipidemia)   . Hypertension   . Shortness of breath    EASING OFF NOW  . Sleep apnea    DOESN'T KNOW SETTINGS  . Stroke (cerebrum) ([place])   . TIA (transient ischemic attack)     Past Surgical History:  Procedure Laterality Date  . ANTERIOR CERVICAL DECOMP/DISCECTOMY FUSION  04/09/2011   Procedure: ANTERIOR CERVICAL DECOMPRESSION/DISCECTOMY FUSION 1 LEVEL;  Surgeon: Charlie Pitter, MD;  Location: [place] NEURO ORS;  Service:  Neurosurgery;  Laterality: N/A;  Cervical five-six Anterior Cervical Decompression and Fusion  . APPENDECTOMY    . BILATERAL OOPHORECTOMY     BENIGN  . CARDIAC CATHETERIZATION     X 2    LAST ONE  2009  . CESAREAN SECTION     X  4  . CHOLECYSTECTOMY    . IMPLANTABLE CARDIOVERTER DEFIBRILLATOR (ICD) GENERATOR CHANGE Left 01/18/2019   Procedure: ICD GENERATOR CHANGE;  Surgeon: Isaias Cowman, MD;  Location: [facility] ORS;  Service: Cardiovascular;  Laterality: Left;  . INSERT / REPLACE / [place]  . JOINT REPLACEMENT     BIL  KNEES  . TONSILLECTOMY    . TUBAL LIGATION      There were no vitals filed for this visit.   Subjective Assessment - 04/02/20 1308    Subjective Patient states that she has not had any episodes of vertigo or dizziness since last seen in clinic which was on 03/19/20.  Patient reports that she is not interested in doing physical therapy for strengthening, balance and mobility training at this time.    Pertinent History Patient reports that she has had episodes of vertigo and dizziness "all my life" and  that she has experienced vertiginous episodes multiple times. Patient states her sister also experiences vertiginous episodes. Patient reports in July she began to have daily episodes of vertigo and dizziness and reports the episodes of lessened now, but that she continues to experience vertigo daily when she gets up out of bed and when she lies down at night in bed. Patient reports that in the past she would take Meclizine and her vertigo would usually subside in a few days, but states this time the vertigo has persisted. Patient states she gets vertigo that lasts seconds. Patients symptoms are motion provoked, positional and intermittent. Patient reports she tries to avoid quick head turns and bending over and back up as this movements bring on her symptoms and that staying still and taking the Meclizine help to ease her symptoms. Patient reports  she has seen an ENT pyhsician in the past for dizziness and was prescribed Meclizine. She reports she has not had any vestibular testing. Patient reports she has fallen 22 times this past year and states the last time she broke three ribs. Patient states the falls were do to her knees buckling and also due to neuropathy. Patient reports she is a limited household walker and uses a rollator for ambulation. Patient is on 2 L of oxygen all of the time. Patient reports that she lives in a Senior Apartment community on the first floor. Patient states the distance to walk to the communal dining room is too far so staff have been bringing her meals to her. Patient reports that she misses the socialization of being able to get out of her room and would like a power wheelchair.    Limitations Walking;Standing;House hold activities    Patient Stated Goals to have decreased vertigo and dizziness and improved balance          Neuromuscular Re-education:  Bed Mobility: Patient requires min assistance of 1 with sit to/from supine at mat table.  Transfers: Patient requires contact-guard assistance with stand pivot transfer from [employer] his chair to/from mat table.  Dix-Hallpike: On inverted mat table, performed left and right Dix-Hallpike tests and both were negative with patient denying vertigo and no nystagmus observed. Performed left and right horizontal canal tests and both were negative with patient denying vertigo and no nystagmus observed.  FUNCTIONAL OUTCOME MEASURES:  Results Comments  DHI 28/100 Low perception of handicap  ABC Scale 22.5% Falls risk; in need of intervention  FOTO 38/100 Moderate difficulty with daily activities; in need of intervention    Discussed functional outcome testing measure scores and compared to prior testing.  Discussed goals and plan of care.  Patient would benefit from additional skilled PT services to address balance, strength and decreased functional mobility  deficits; however, patient declines physical therapy to address these deficits at this time.  Patient reports that she has had no further episodes of vertigo or dizziness since last being seen in the clinic 2 weeks ago after the Epley maneuver was performed.  Patient has had resolution of her vertiginous symptoms and patient is in agreement with discharge from PT services at this time.     PT Education - 04/02/20 1352    Education Details discussed functional outcome testing and compared to prior testing, discussed progress towards goals and discharge plans    Person(s) Educated Patient    Methods Explanation    Comprehension Verbalized understanding            PT Short Term Goals - 04/02/20 1336  PT SHORT TERM GOAL #1   Title Patient will report 50% or greater improvement in her symptoms of dizziness and imbalance with provoking motions or positions.    Baseline reports vertigo when she lies down and when she gets out of bed daily; reports 95% improvement in her symptoms on 04/02/20    Time 4    Period Weeks    Status Achieved    Target Date 04/16/20             PT Long Term Goals - 04/02/20 1337      PT LONG TERM GOAL #1   Title Patient will have improved FOTO score of 5 points or greater in order to demonstrate improvements in patient's ADLs and functional performance.    Baseline scored 48/100 on 01/26/20; scored 38/100 on 04/02/20    Time 12    Period Weeks    Status Not Met      PT LONG TERM GOAL #2   Title Patient reports no vertigo with provoking motions or positions.    Baseline patient reports no episodes of vertigo or dizziness since last visit 2 weeks ago    Time 12    Period Weeks    Status Achieved      PT LONG TERM GOAL #3   Title Patient will reduce perceived disability to low levels as indicated by <40 on Dizziness Handicap Inventory.    Baseline scored 60/100 on 01/26/20; scored 28/100 on 04/02/20    Time 12    Period Weeks    Status Achieved       PT LONG TERM GOAL #4   Title Patient will reduce falls risk as indicated by Activities Specific Balance Confidence Scale (ABC) >67%.    Baseline scored 32.5% on 01/26/20; scored 22.5% on 04/02/20    Time 12    Period Weeks    Status Not Met                 Plan - 04/02/20 1643    Clinical Impression Statement Patient was last seen in the clinic on 03/19/2020 at which time Epley maneuvers were performed as patient had positive left Dix-Hallpike maneuver.  Patient reports she has not had any further episodes of dizziness or vertigo in the last 2 weeks.  Repeated canal testing of left and right Dix-Hallpike test and left and right horizontal canal test and all were negative this date with no nystagmus observed and patient denied dizziness.  Repeated functional outcome testing and patient improved on the dizziness handicap inventory from moderate perception of handicap down to 28/100 low perception of handicap.  Patient scored 22.5% on the ABC scale and 38/100 on FOTO this date.  Patient has met short-term goal #1 as she reports she has had a 95% improvement in her symptoms of dizziness and imbalance.  Patient has met 2 out of 4 long-term goals and did not meet 2 out of 4 long-term goals. Patient would benefit from additional skilled PT services to address balance, strength and decreased functional mobility deficits; however, patient declines physical therapy to address these deficits at this time. Patient has had resolution of her vertiginous symptoms and patient is in agreement with discharge from PT services at this time.  Patient to call clinic should her vertiginous symptoms return.  Will plan on discharging patient from vestibular PT services at this time.    Personal Factors and Comorbidities Comorbidity 3+;Time since onset of injury/illness/exacerbation;Past/Current Experience;Fitness    Comorbidities anxiety, DM, CHF, TIA/Stroke,  HTN, sleep apnea, neuropathy    Examination-Activity  Limitations Bend;Bed Mobility;Reach Overhead;Locomotion Level;Transfers;Stairs;Stand    Examination-Participation Restrictions Cleaning    Stability/Clinical Decision Making Evolving/Moderate complexity    Rehab Potential Good    PT Frequency 1x / week    PT Duration 8 weeks    PT Treatment/Interventions ADLs/Self Care Home Management;Canalith Repostioning;Gait training;Stair training;Functional mobility training;Therapeutic activities;Therapeutic exercise;Balance training;Neuromuscular re-education;Patient/family education;Vestibular    PT Next Visit Plan repeat Dix-Hallpike testing and perform treatment maneuvers if indicated; once BPPV is cleared complete vestibulo-ocular testing, modified CTSIB, coordination, balance and strength testing.    Consulted and Agree with Plan of Care Patient           Patient will benefit from skilled therapeutic intervention in order to improve the following deficits and impairments:  Difficulty walking,Cardiopulmonary status limiting activity,Dizziness,Decreased endurance,Decreased activity tolerance,Decreased balance,Decreased mobility,Decreased strength,Impaired sensation  Visit Diagnosis: Dizziness and giddiness  Repeated falls  Muscle weakness (generalized)  Difficulty in walking, not elsewhere classified     Problem List Patient Active Problem List   Diagnosis Date Noted  . Overdose of coumadin, accidental or unintentional, initial encounter 07/29/2019  . 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 Piriformis syndrome 06/18/2014  . Facet syndrome, lumbar 06/18/2014  . 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 DDD (degenerative disc disease), lumbar 06/18/2014  . DDD (degenerative disc disease), cervical 06/08/2014  . 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 Cervical spinal stenosis 04/09/2011   Lady Deutscher PT, DPT [phone] Lady Deutscher 04/02/2020, 4:49 PM  [place] MAIN [facility] SERVICES [address] [place], [place], [address] Phone: [phone]   Fax:  [phone]  Name: [name] MRN: [id number] Date of Birth: 1941/01/16

## 2020-04-03 ENCOUNTER — Ambulatory Visit: Payer: Medicare Other | Attending: Neurology

## 2020-04-03 DIAGNOSIS — R2681 Unsteadiness on feet: Secondary | ICD-10-CM

## 2020-04-03 NOTE — Therapy (Signed)
[place] MAIN [facility] SERVICES [address] [place], [place], [address] Phone: [phone]   Fax:  [phone]  Physical Therapy Evaluation  Patient Details  Name: [name] MRN: [id number] Date of Birth: 07-31-1940 Referring Provider (PT): Dr. Melrose Nakayama   Encounter Date: 04/03/2020   PT End of Session - 04/03/20 1727    Visit Number 1    Number of Visits 1    Date for PT Re-Evaluation 04/03/20    PT Start Time 6599    PT Stop Time 1450    PT Time Calculation (min) 65 min    Equipment Utilized During Treatment Gait belt    Activity Tolerance Patient limited by fatigue    Behavior During Therapy [facility] for tasks assessed/performed           Past Medical History:  Diagnosis Date  . AICD (automatic cardioverter/defibrillator) present    PLACED BY  DR  KLEIN 2008  . Angina    5-6 YRS AGO  . Anxiety   . Arthritis   . Atrial fibrillation ([place])   . CHF (congestive heart failure) ([place])   . Complication of anesthesia    states at times had a tough time waking up  . Diabetes mellitus   . Diverticulum of esophagus    large diverticulum in the middle third of the esophagus [facility] EGD '09)  . Dysrhythmia    A-FIB  . Fibromyalgia   . Headache(784.0)   . Heartburn   . History of stomach ulcers   . History of transient ischemic attack (TIA)    LAST ONE IN  2005  IN  [place], [place]  . HLD (hyperlipidemia)   . Hypertension   . Shortness of breath    EASING OFF NOW  . Sleep apnea    DOESN'T KNOW SETTINGS  . Stroke (cerebrum) ([place])   . TIA (transient ischemic attack)     Past Surgical History:  Procedure Laterality Date  . ANTERIOR CERVICAL DECOMP/DISCECTOMY FUSION  04/09/2011   Procedure: ANTERIOR CERVICAL DECOMPRESSION/DISCECTOMY FUSION 1 LEVEL;  Surgeon: Charlie Pitter, MD;  Location: [place] NEURO ORS;  Service: Neurosurgery;  Laterality: N/A;  Cervical five-six Anterior Cervical Decompression and Fusion  . APPENDECTOMY    . BILATERAL  OOPHORECTOMY     BENIGN  . CARDIAC CATHETERIZATION     X 2    LAST ONE  2009  . CESAREAN SECTION     X  4  . CHOLECYSTECTOMY    . IMPLANTABLE CARDIOVERTER DEFIBRILLATOR (ICD) GENERATOR CHANGE Left 01/18/2019   Procedure: ICD GENERATOR CHANGE;  Surgeon: Isaias Cowman, MD;  Location: [facility] ORS;  Service: Cardiovascular;  Laterality: Left;  . INSERT / REPLACE / [place]  . JOINT REPLACEMENT     BIL  KNEES  . TONSILLECTOMY    . TUBAL LIGATION      There were no vitals filed for this visit.    Subjective Assessment - 04/03/20 1724    Subjective Patient presents to PT for power wheelchair evaluation.    Pertinent History Patient is a pleasant 80 year old female who presents for a wheelchair evaluation for her imbalance. 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 Past surgeries includes botox injections to bladder, cardiac cath, pacemaker, knee replacement. Patient is on 2 L of 02 via nasal cannula 24/7. Patient has has  over 21 falls in one year last year. Has multiple near falls a day. Uses a walker at home at this time but gets winded and doesn't feel steady.    Limitations Walking;Standing;House hold activities    How long can you sit comfortably? n/a-all day    How long can you stand comfortably? unsteady    How long can you walk comfortably? falls/near falls daily              [facility] PT Assessment - 04/03/20 0001      Assessment   Medical Diagnosis falls    Referring Provider (PT) Dr. Melrose Nakayama    Onset Date/Surgical Date --   over a year ago   Hand Dominance Right      Precautions   Precautions Fall;ICD/Pacemaker      Restrictions   Weight Bearing Restrictions No      Balance Screen   Has the patient fallen in the past 6 months Yes    How many times? over 22 falls in past year    Has the patient had  a decrease in activity level because of a fear of falling?  Yes    Is the patient reluctant to leave their home because of a fear of falling?  Yes      Home Environment   Living Environment Other (Comment)    Additional Comments Senior Living Apartments      Cognition   Overall Cognitive Status Within Functional Limits for tasks assessed                 PATIENT INFORMATION: This Evaluation form will serve as the LMN for the following suppliers:  Supplier: Contact Person: Phone:   Reason for Referral: Patient/caregiver Goals: Patient was seen for face-to-face evaluation for new power wheelchair.  Also present was  Deberah Pelton from [facility]  to discuss recommendations and wheelchair options.  Further paperwork was completed and sent to vendor.  Patient appears to qualify for power mobility device at this time per objective findings.   MEDICAL HISTORY: Diagnosis: frequent falls Primary Diagnosis Onset: in the past year it has worsened.  []Progressive Disease Relevant Past and Future Surgeries: Past surgeries includes botox injections to bladder, cardiac cath, pacemaker, knee replacement Height: 5 ft 3  Weight: 255 Explain and recent changes or trends in weight:   Relevant History including falls: Patient is a pleasant 80 year old female who presents for a wheelchair evaluation for her imbalance. 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 Past surgeries includes botox injections to bladder, cardiac cath, pacemaker, knee replacement. Patient is on 2 L of 02 via nasal cannula 24/7. Patient has has over 21 falls in one year last year. Has multiple near falls a day. Uses a walker at home at this time but gets winded and doesn't feel steady.    HOME ENVIRONMENT: []House  []Condo/town home  [x]Apartment  []Assisted Living    [x]Lives Alone []  Lives with Others                                                    Hours with caregiver:   [x]Home is accessible to patient            Stairs  []  Yes [] No     Ramp []Yes []No Comments:  Has a cleaning lady, senior community facility. Handicap accessible apartment. Grab bars and shower seat in tub shower.    COMMUNITY ADL: TRANSPORTATION: [x]Car    []Van    [x]Public Transportation    []Adapted w/c Lift   []Ambulance   []Other:       []Sits in wheelchair during transport  Employment/School:     Specific requirements pertaining to mobility                                                     Other:     Cousins drive patient to appointments. Has access to call for a Lucianne Lei for if she was to approved for a wheelchair.                                    FUNCTIONAL/SENSORY PROCESSING SKILLS:  Handedness:   [x]Right     []Left    []NA  Comments:                                 Media planner for Wheeled Mobility [x]Processing Skills are adequate for safe wheelchair operation  Areas of concern than may interfere with safe operation of wheelchair Description of problem   [] Attention to environment     []Judgment     [] Hearing  [] Vision or visual processing    []Motor Planning  [] Fluctuations in Behavior                                                   VERBAL COMMUNICATION: [x]WFL receptive [x] WFL expressive []Understandable  []Difficult to understand  []non-communicative [] Uses an augmented communication device    CURRENT SEATING / MOBILITY: Current Mobility Base:   [x]None  []Dependent  []Manual  []Scooter  []Power   Type of Control:                       Manufacturer:                         Size:                         Age:                           Current Condition of Mobility Base:  Current Wheelchair components:                                                                                                                                    Describe posture in present seating system:                                                                            SENSATION and SKIN ISSUES: Sensation []Intact [x]Impaired []Absent   Level of sensation:   Bilateral feet with L>R                        Pressure Relief: Able to perform effective pressure relief :   []Yes  [x] No Method:                                                                              If not, Why?:     Shortness of breath, limited arm strength.                                                                      Skin Issues/Skin Integrity Current Skin Issues   [x]Yes []No []Intact [] Red area [] Open Area  []Scar Tissue []At risk from prolonged sitting  Where  R foot from hitting foot against walker                            History of Skin Issues   []Yes [x]No  Where                                         When                                               Hx of skin flap surgeries []Yes [x]No  Where  When                                                  Limited sitting tolerance []Yes [x]No Hours spent sitting in wheelchair daily:   Majority of the day.                                                       Complaint of Pain:  Please describe:    Pelvic region; hx of pelvic fracture.                                                                                                          Swelling/Edema:     LLE.                                                                                                                                              ADL STATUS (in reference to wheelchair use):  Indep Assist Unable Indep with Equip Not assessed Comments  Dressing                              x                              Needs to use grabber             Eating       x  Toileting                                           x                                                    Needs heavy use of support bars.                                 Bathing                          x                                      Uses wash rags. Doesn't feel safe to take a shower.                                                                       Grooming/ Hygiene                                        x                             Needs assistance occasionally but uses AD                                                         Meal Prep                            x                                Stopped cooking, uses microwave meals                                                              IADLS                                         x  Has no one to help. Has to use a reacher at home. Can't use upper shelves, difficulty with transfers.                     Bowel Management: []Continent  [x]Incontinent  []Accidents Comments:   30% incontinence per patient. Not always able to make it to the restroom in time.                                                Bladder Management: []Continent  [x]Incontinent  []Accidents Comments: Incontinent , unable to make it to restroom in time.                                             WHEELCHAIR SKILLS: Manual w/c Propulsion: []UE or LE strength and endurance sufficient to participate in ADLs using manual wheelchair Arm :  []left []right  []Both                                   Foot:   []left []right  []Both  Distance:   Operate Scooter: [] Strength, hand grip, balance and transfer appropriate for use []Living environment is accessible for use of scooter  Operate Power w/c:  [] Std. Joystick   [] Alternative Controls Indep [x] Assist [] Dependent/ Unable [] N/A [] []Safe          [] Functional      Distance:                 Bed confined without wheelchair [x] Yes; is a high fall risk and is unsafe with transfers [] No   STRENGTH/RANGE OF MOTION:  Range of Motion Strength  Shoulder      Flexion: 65 bilateral (painful) Abduction 91 L 93 R                                                   Grossly 2+/5 with pain                                                      Elbow   WFL                                                  Grossly 3+/5                                                            Wrist/Hand   [facility]  Grossly 3/5 with L grip 2/5                                                                    Hip Hip flexion limited AROM bilat due to weakness, limited hip extension bilaterally                                                                     Flexion 2/5, abduction/adduction 3/5,hip extension 2+/5                                                        Knee    WFL                                                         flexion 3/5, extension 3+/5                                                                  Ankle  slight df deficit bilaterally but able to obtain neutral        Grossly 3/5                                                                 MOBILITY/BALANCE:  [] Patient is totally dependent for mobility                                                                                               Balance Transfers Ambulation  Sitting Balance: Standing Balance: [] Independent [] Independent/Modified Independent  [] WFL     [] [facility] [x] Supervision; unsafe [] Supervision  [x] Uses UE for balance  [x] Supervision [x] Min Assist; unsafe very high fall risk, stabilization required to RW. [] Ambulates with Assist                           []  Min Assist [x] Min assist; requires stabilization to walker  [] Mod Assist [] Ambulates with Device:  [] RW   [] StW   [] Kasandra Knudsen   []                [] Mod Assist [] Mod assist [] Max  assist   [] Max Assist [] Max assist [] Dependent [x] Indep. Short Distance Only  [] Unable [] Unable [] Lift / Sling Required Distance (in feet) 3 short steps in gym; very unsafe min-mod A for stability, HIGH fall risk.                             [] Sliding board [] Unable to Ambulate: (Explain:  Cardio Status:  []Intact  [x] Impaired   [] NA                              Respiratory Status:  []Intact   [x]Impaired   []NA                                     Orthotics/Prosthetics:   n/a                                                                      Comments (Address manual vs power w/c vs scooter):      Patient is a pleasant 80 year old female who presents with high fall risk, limited strength, and poor mobility. She has limited UE ROM as well as strength making manual chairs a not viable option. She is unsafe for ambulation with RW and 4WW with locking of LE's and excessive forward trunk lean requiring PT to stabilize patient to prevent fall indicating she is no longer a safe ambulatory and will require a chair. She does not have the trunk stability to safely maneuver a scooter at this time. At this time a power wheelchair will allow for her to increase her independence with mobility, decrease her fall risk, and improve her quality of life.                                                        Anterior / Posterior Obliquity Rotation-Pelvis  PELVIS    [x]Neutral  [] Posterior  [] Anterior     [x]WFL  []Right Elevated  []Left Elevated   [x]WFL  []Right Anterior []  Left Anterior    [] Fixed [x] Partly Flexible [] Flexible  [] Other  [] Fixed  [x] Partly Flexible  [] Flexible [] Other  [] Fixed  [x] Partly Flexible  [] Flexible [] Other  TRUNK [x]WFL []Thoracic Kyphosis []Lumbar Lordosis   [x] WFL []Convex Right []Convex Left   []c-curve []s-curve []multiple  [x] Neutral [] Left-anterior [] Right-anterior    [] Fixed [] Flexible [x] Partly  Flexible       Other  [] Fixed []  Flexible [x] Partly Flexible [] Other  [] Fixed           [] Flexible [x] Partly Flexible [] Other   Position Windswept   HIPS  [x] Neutral [] Abduct [] ADduct [x] Neutral [] Right [] Left       [] Fixed  [x] Partly Flexible             [] Dislocated [] Flexible [] Subluxed    [] Fixed [x] Partly Flexible  [] Flexible [] Other              Foot Positioning Knee Positioning   Knees and  Feet  [x] WFL []Left []Right [x] [facility] []Left []Right   KNEES ROM concerns: ROM concerns:   & Dorsi-Flexed                    []Lt []Rt                                  FEET Plantar Flexed                  []Lt []Rt     Inversion                    []Lt []Rt     Eversion                    []Lt []Rt    HEAD [x] Functional [x] Good Head Control   & [] Flexed         [] Extended [] Adequate Head Control   NECK [] Rotated  Lt  [] Lat Flexed Lt [] Rotated  Rt [] Lat Flexed Rt [] Limited Head Control    [] Cervical Hyperextension [] Absent  Head Control    SHOULDERS ELBOWS WRIST& HAND         Left     Right    Left     Right  U/E []Functional  Left            []Functional  Right                                 []Fisting             []Fisting     []elevated Left []depressed  Left []elevated Right []depressed  Right      [x]protracted Left []retracted Left [x]protracted Right []retracted Right []subluxed  Left              []subluxed  Right         Goals for Wheelchair Mobility  [x] Independence with mobility in the home with motor related ADLs (MRADLs)  [x] Independence with MRADLs in the community [x] Provide dependent mobility  [] Provide recline     [x]Provide tilt   Goals for Seating system [x] Optimize pressure distribution [x] Provide support needed to facilitate function or safety [x] Provide corrective forces to assist with maintaining or improving posture [x] Accommodate client's posture: current seated postures and  positions are not flexible or will not tolerate corrective forces [x] Client to be independent with relieving pressure in the wheelchair [x]Enhance physiological function such as breathing, swallowing, digestion  Simulation ideas/Equipment trials:  State why other equipment was unsuccessful:         Patient is a pleasant 80 year old female who presents with high fall risk, limited strength, and poor mobility. She has limited UE ROM as well as strength making manual chairs a not viable option. She is unsafe for ambulation with RW and 4WW with locking of LE's and excessive forward trunk lean requiring PT to stabilize patient to prevent fall indicating she is no longer a safe ambulatory and will require a chair. She does not have the trunk stability to safely maneuver a scooter at this time. At this time a power wheelchair will allow for her to increase her independence with mobility, decrease her fall risk, and improve her quality of life. The Quantum J4 is the best option for this patient at this time and will provide transport within the home to promote independent mobility and ADL performance.  She will require a power tilt for pressure relief, postural correction, reduction of edema in her LLE, and breathing relief.   An angle adjustable back will allow for pressure relief on spinal processes, proper postural correction, and reduction of pain in pelvis.    A TruComfort cushion will allow for pressure relief, reduction of pain in sacrum, and is low maintenance. Mounting hardware will allow for back and headrest to be positioned for optimal postural alignment. Flip back adjustable height arm rests will allow patient to come closer to table top for ADL performance. A flip up center mount foot rest will allow for patient to transfer in a more safe manner and position limbs in a neutral alignment. A head rest will allow  support during tilt and improve respiration. An oxygen tank holder and attachment will allow patient to utilize her 2 L of oxygen via nasal cannula to maintain an SP02> 90%. In conclusion a Quantum J4 power chair is the best option for this patient to allow for optimal independence with ADL performance, decrease fall risk, and improve quality of life.                                                                        MOBILITY BASE RECOMMENDATIONS and JUSTIFICATION: MOBILITY COMPONENT JUSTIFICATION  Manufacturer:     Quantum      Model: J4              Size: Width 20          Seat Depth  22           [x]provide transport from point A to B [x]promote Indep mobility  [x]is not a safe, functional ambulator [x]walker or cane inadequate []non-standard width/depth necessary to accommodate anatomical measurement []                            []Manual Mobility Base []non-functional ambulator    []Scooter/POV  []can safely operate  []can safely transfer   []has adequate trunk stability  []cannot functionally propel manual w/c  [x]Power Mobility Base  [x]non-ambulatory/high fall risk [x]cannot functionally propel manual wheelchair  [x] cannot functionally and safely operate scooter/POV [x]can safely operate and willing to  []Stroller Base []infant/child  []unable to propel manual wheelchair []allows for  growth []non-functional ambulator []non-functional UE []Indep mobility is not a goal at this time  [x]Tilt  []Forward                   [x]Backward                  [x]Powered tilt              []Manual tilt  [x]change position against gravitational force on head and shoulders  [x]change position for pressure relief/cannot weight shift []transfers  []management of tone [x]rest periods [x]control edema [x]facilitate postural control  []                                      []Recline  []Power recline on power base []Manual recline on manual base  []accommodate femur to back angle   []bring to full recline for ADL care  []change position for pressure relief/cannot weight shift []rest periods []repositioning for transfers or clothing/diaper /catheter changes []head positioning  []Lighter weight required []self- propulsion  []lifting []                                                []Heavy Duty required []user weight greater than 250# []extreme tone/ over active movement []broken frame on previous chair []                                    [x] Back  [x] Angle Adjustable [] Custom molded                           [x]postural control []control of tone/spasticity []accommodation of range of motion [x]UE functional control [x]accommodation for seating system []                                         []provide lateral trunk support []accommodate deformity [x]provide posterior trunk support [x]provide lumbar/sacral support []support trunk in midline [x]Pressure relief over spinal processes  [x] Seat Cushion: TruComfort                       []impaired sensation  []decubitus ulcers present []history of pressure ulceration []prevent pelvic extension [x]low maintenance  [x]stabilize pelvis  []accommodate obliquity []accommodate multiple deformity [x]neutralize lower extremity position [x]increase pressure distribution []                                          [] Pelvic/thigh support  [] Lateral thigh guide [] Distal medial pad  [] Distal lateral pad [] pelvis in neutral []accommodate pelvis [] position upper legs [] alignment [] accommodate ROM [] decrease adduction []accommodate tone []removable for transfers []decrease abduction  [] Lateral trunk Supports [] Lt     [] Rt []decrease lateral trunk leaning []control tone []contour for increased contact []safety  []accommodate asymmetry []                                                [  x] Mounting hardware  []lateral trunk supports  [x]back   []seat [x]headrest      [] thigh support [x]fixed    []swing away [x]attach seat platform/cushion to w/c frame [x]attach back cushion to w/c frame []mount postural supports [x]mount headrest  []swing medial thigh support away []swing lateral supports away for transfers  []                                                    Armrests  []fixed [x]adjustable height []removable   []swing away  [x]flip back   []reclining [x]full length pads []desk    []pads tubular  [x]provide support with elbow at 90   []provide support for w/c tray [x]change of height/angles for variable activities []remove for transfers [x]allow to come closer to table top []remove for access to tables []                                              Hangers/ Leg rests  []60 []70 []90 []elevating []heavy duty  []articulating []fixed []lift off []swing away     []power []provide LE support  []accommodate to hamstring tightness []elevate legs during recline   []provide change in position for Legs []Maintain placement of feet on footplate []durability []enable transfers []decrease edema []Accommodate lower leg length []                                        Foot support Footplate    []Lt  [] Rt  [x] Center mount [x]flip up                            []depth/angle adjustable []Amputee adapter    [] Lt     [] Rt [x]provide foot support []accommodate to ankle ROM [x]transfers []Provide support for residual extremity [] allow foot to go under wheelchair base [] decrease tone  []                                                [] Ankle strap/heel loops []support foot on foot support []decrease extraneous movement []provide input to heel  []protect foot  Tires: []pneumatic  [x]flat free inserts  []solid  [x]decrease maintenance  [x]prevent frequent flats []increase shock absorbency []decrease pain from road shock []decrease spasms from road shock []                                             [x] Headrest: 10 inch  [x]provide posterior head support []provide  posterior neck support []provide lateral head support []provide anterior head support [x]support during tilt and recline []improve feeding   [x]improve respiration []placement of switches []safety  []accommodate ROM  []accommodate tone []improve visual orientation  [] Anterior chest strap [] Vest [] Shoulder retractors  []decrease forward movement of shoulder []accommodation of  TLSO []decrease forward movement of trunk []decrease shoulder elevation []added abdominal support []alignment []assistance with shoulder control  []                                              Pelvic Positioner [x]Belt []SubASIS bar []Dual Pull []stabilize tone [x]decrease falling out of chair/ **will not Decrease potential for sliding due to pelvic tilting []prevent excessive rotation []pad for protection over boney prominence []prominence comfort []special pull angle to control rotation []                                                 Upper ExtremitySupport  []L   [] R []Arm trough   []hand support [] tray       []full tray []swivel mount []decrease edema      []decrease subluxation   []control tone   []placement for AAC/Computer/EADL []decrease gravitational pull on shoulders []provide midline positioning []provide support to increase UE function []provide hand support in natural position []provide work surface   POWER WHEELCHAIR CONTROLS  [x]Proportional  []Non-Proportional Type                                      []Left  [x]Right [x]provides access for controlling wheelchair   []lacks motor control to operate proportional drive control []unable to understand proportional controls  Actuator Control Module  [x]Single  []Multiple   [x]Allow the client to operate the power seat function(s) through the joystick control   []Safety Reset Switches []Used to change modes and stop the wheelchair when driving in latch mode    []Upgraded Electronics   []programming for accurate  control []progressive Disease/changing condition []non-proportional drive control needed []Needed in order to operate power seat functions through joystick control   []Display box []Allows user to see in which mode and drive the wheelchair is set  []necessary for alternate controls    []Digital interface electronics []Allows w/c to operate when using alternative drive controls  []ASL Head Array []Allows client to operate wheelchair  through switches placed in tri-panel headrest  []Sip and puff with tubing kit []needed to operate sip and puff drive controls  []Upgraded tracking electronics []increase safety when driving []correct tracking when on uneven surfaces  [x]Mount for switches or joystick [x]Attaches switches to w/c  [x]Swing away for access or transfers []midline for optimal placement []provides for consistent access  []Attendant controlled joystick plus mount []safety []long distance driving []operation of seat functions []compliance with transportation regulations []                                            Rear wheel placement/Axle adjustability []None []semi adjustable []fully adjustable  []improved UE access to wheels []improved stability []changing angle in space for improvement of postural stability []1-arm drive access []amputee pad placement []                               Wheel rims/ hand rims  []metal   []  plastic coated []oblique projections           []vertical projections []Provide ability to propel manual wheelchair  [] Increase self-propulsion with hand weakness/decreased grasp  Push handles []extended   []angle adjustable              []standard []caregiver access []caregiver assist []allows "hooking" to enable increased ability to perform ADLs or maintain balance  One armed device   []Lt   []Rt []enable propulsion of manual wheelchair with one arm   []                                           Brake/wheel lock extension [] Lt   [] Rt []increase indep  in applying wheel locks   []Side guards []prevent clothing getting caught in wheel or becoming soiled [] prevent skin tears/abrasions  Battery:  Group 22                                          [x]to power wheelchair                                                         Other:      Oxygen holder and hardware                                 to provide holder  For oxygen.                                                                                  The above equipment has a life- long use expectancy. Growth and changes in medical and/or functional conditions would be the exceptions. This is to certify that the therapist has no financial relationship with durable medical provider or manufacturer. The therapist will not receive remuneration of any kind for the equipment recommended in this evaluation.   Patient has mobility limitation that significantly impairs safe, timely participation in one or more mobility related ADL's. (bathing, toileting, feeding, dressing, grooming, moving from room to room)  [x] Yes [] No  Will mobility device sufficiently improve ability to participate and/or be aided in participation of MRADL's?      [x] Yes [] No  Can limitation be compensated for with use of a cane or walker?                                    [] Yes [x] No  Does patient or caregiver demonstrate ability/potential ability & willingness to safely use the mobility device?    [x] Yes [] No  Does patient's home environment support use of recommended mobility device?            [  x] Yes [] No  Does patient have sufficient upper extremity function necessary to functionally propel a manual wheelchair?     [] Yes [x] No  Does patient have sufficient strength and trunk stability to safely operate a POV (scooter)?                                  [] Yes [x] No  Does patient need additional features/benefits provided by a power wheelchair for MRADL's in the home?        [x] Yes [] No  Does the  patient demonstrate the ability to safely use a power wheelchair?                   [x] Yes [] No     Physician's Name Printed:                                                        69 Signature:  Date:     This is to certify that I, the above signed therapist have the following affiliations: [] This DME provider [] Manufacturer of recommended equipment [] Patient's long term care facility [x] None of the above  Therapist Name/Signature:       Janna Arch, PT, DPT                                       Date: 04/03/20          Objective measurements completed on examination: See above findings.               PT Education - 04/03/20 1726    Education Details wheelchair evaluation    Person(s) Educated Patient    Methods Explanation;Demonstration;Tactile cues;Verbal cues    Comprehension Verbalized understanding;Returned demonstration;Verbal cues required;Tactile cues required            PT Short Term Goals - 04/03/20 1729      PT SHORT TERM GOAL #1   Title Pt and caregivers will understand PT recommendation and appropriate/safe use for wheelchair and seating for home use.    Baseline met    Time 1    Period Days    Status Achieved             PT Long Term Goals - 04/03/20 1730      PT LONG TERM GOAL #1   Title Pt and caregivers will understand PT recommendation and appropriate/safe use for wheelchair and seating for home use.    Baseline MET    Time 1    Period Days    Status Achieved                  Plan - 04/03/20 1728    Clinical Impression Statement Patient is a pleasant 80 year old female who presents with high fall risk, limited strength, and poor mobility. She has limited UE ROM as well as strength making manual chairs a not viable option. She is unsafe for ambulation with RW and 4WW with locking of LE's and excessive forward trunk lean requiring PT to stabilize patient to prevent fall indicating she is no longer a safe  ambulatory  and will require a chair. She does not have the trunk stability to safely maneuver a scooter at this time. At this time a power wheelchair will allow for her to increase her independence with mobility, decrease her fall risk, and improve her quality of life. The Quantum J4 is the best option for this patient at this time and will provide transport within the home to promote independent mobility and ADL performance.  She will require a power tilt for pressure relief, postural correction, reduction of edema in her LLE, and breathing relief.   An angle adjustable back will allow for pressure relief on spinal processes, proper postural correction, and reduction of pain in pelvis.    A Tru[employer]fort cushion will allow for pressure relief, reduction of pain in sacrum, and is low maintenance. Mounting hardware will allow for back and headrest to be positioned for optimal postural alignment. Flip back adjustable height arm rests will allow patient to come closer to table top for ADL performance. A flip up center mount foot rest will allow for patient to transfer in a more safe manner and position limbs in a neutral alignment. A head rest will allow support during tilt and improve respiration. An oxygen tank holder and attachment will allow patient to utilize her 2 L of oxygen via nasal cannula to maintain an SP02> 90%. In conclusion a Quantum J4 power chair is the best option for this patient to allow for optimal independence with ADL performance, decrease fall risk, and improve quality of life.    Personal Factors and Comorbidities Comorbidity 3+;Time since onset of injury/illness/exacerbation;Past/Current Experience;Fitness    Comorbidities anxiety, DM, CHF, TIA/Stroke, HTN, sleep apnea, neuropathy    Examination-Activity Limitations Bend;Bed Mobility;Reach Overhead;Locomotion Level;Transfers;Stairs;Stand;Bathing;Continence;Dressing;Hygiene/Grooming;Lift;Squat;Toileting    Examination-Participation  Restrictions Cleaning;Community Activity;Driving;Personal Finances;Meal Prep;Laundry;Shop    Stability/Clinical Decision Making Evolving/Moderate complexity    Clinical Decision Making Moderate    PT Frequency One time visit           Patient will benefit from skilled therapeutic intervention in order to improve the following deficits and impairments:  Difficulty walking,Cardiopulmonary status limiting activity,Dizziness,Decreased endurance,Decreased activity tolerance,Decreased balance,Decreased mobility,Decreased strength,Impaired sensation  Visit Diagnosis: Unsteadiness on feet     Problem List Patient Active Problem List   Diagnosis Date Noted  . Overdose of coumadin, accidental or unintentional, initial encounter 07/29/2019  . 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 Piriformis syndrome 06/18/2014  . Facet syndrome, lumbar 06/18/2014  . 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 DDD (degenerative disc disease), lumbar 06/18/2014  . DDD (degenerative disc disease), cervical 06/08/2014  . 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 Cervical spinal stenosis 04/09/2011   Janna Arch, PT, DPT   04/03/2020, 5:30 PM  [place] [facility] SERVICES [address] [place], [place], [address] Phone: [phone]   Fax:  [phone]  Name: [name] MRN: [id number] Date of Birth: 1940/03/03

## 2020-04-09 ENCOUNTER — Ambulatory Visit: Payer: Medicare Other | Admitting: Physical Therapy

## 2020-04-12 ENCOUNTER — Emergency Department: Payer: Medicare Other

## 2020-04-12 ENCOUNTER — Inpatient Hospital Stay
Admission: EM | Admit: 2020-04-12 | Discharge: 2020-04-17 | DRG: 291 | Disposition: A | Discharge status: Skilled Nursing Facility | Payer: Medicare Other | Attending: Internal Medicine | Admitting: Internal Medicine

## 2020-04-12 ENCOUNTER — Encounter: Payer: Self-pay | Admitting: Emergency Medicine

## 2020-04-12 ENCOUNTER — Other Ambulatory Visit: Payer: Self-pay

## 2020-04-12 DIAGNOSIS — Z881 Allergy status to other antibiotic agents status: Secondary | ICD-10-CM

## 2020-04-12 DIAGNOSIS — J9621 Acute and chronic respiratory failure with hypoxia: Secondary | ICD-10-CM | POA: Diagnosis present

## 2020-04-12 DIAGNOSIS — Z7984 Long term (current) use of oral hypoglycemic drugs: Secondary | ICD-10-CM

## 2020-04-12 DIAGNOSIS — Z79899 Other long term (current) drug therapy: Secondary | ICD-10-CM

## 2020-04-12 DIAGNOSIS — R52 Pain, unspecified: Secondary | ICD-10-CM

## 2020-04-12 DIAGNOSIS — E662 Morbid (severe) obesity with alveolar hypoventilation: Secondary | ICD-10-CM | POA: Diagnosis present

## 2020-04-12 DIAGNOSIS — Z9109 Other allergy status, other than to drugs and biological substances: Secondary | ICD-10-CM

## 2020-04-12 DIAGNOSIS — R531 Weakness: Secondary | ICD-10-CM | POA: Diagnosis not present

## 2020-04-12 DIAGNOSIS — J9622 Acute and chronic respiratory failure with hypercapnia: Secondary | ICD-10-CM | POA: Diagnosis present

## 2020-04-12 DIAGNOSIS — M109 Gout, unspecified: Secondary | ICD-10-CM | POA: Diagnosis present

## 2020-04-12 DIAGNOSIS — E871 Hypo-osmolality and hyponatremia: Secondary | ICD-10-CM | POA: Diagnosis present

## 2020-04-12 DIAGNOSIS — Z882 Allergy status to sulfonamides status: Secondary | ICD-10-CM

## 2020-04-12 DIAGNOSIS — E878 Other disorders of electrolyte and fluid balance, not elsewhere classified: Secondary | ICD-10-CM | POA: Diagnosis present

## 2020-04-12 DIAGNOSIS — E1169 Type 2 diabetes mellitus with other specified complication: Secondary | ICD-10-CM | POA: Diagnosis not present

## 2020-04-12 DIAGNOSIS — E8881 Metabolic syndrome: Secondary | ICD-10-CM | POA: Diagnosis present

## 2020-04-12 DIAGNOSIS — E119 Type 2 diabetes mellitus without complications: Secondary | ICD-10-CM | POA: Diagnosis present

## 2020-04-12 DIAGNOSIS — Z6841 Body Mass Index (BMI) 40.0 and over, adult: Secondary | ICD-10-CM

## 2020-04-12 DIAGNOSIS — R0602 Shortness of breath: Secondary | ICD-10-CM

## 2020-04-12 DIAGNOSIS — Z8673 Personal history of transient ischemic attack (TIA), and cerebral infarction without residual deficits: Secondary | ICD-10-CM

## 2020-04-12 DIAGNOSIS — Z20822 Contact with and (suspected) exposure to covid-19: Secondary | ICD-10-CM | POA: Diagnosis present

## 2020-04-12 DIAGNOSIS — I482 Chronic atrial fibrillation, unspecified: Secondary | ICD-10-CM | POA: Diagnosis present

## 2020-04-12 DIAGNOSIS — G9340 Encephalopathy, unspecified: Secondary | ICD-10-CM

## 2020-04-12 DIAGNOSIS — F419 Anxiety disorder, unspecified: Secondary | ICD-10-CM | POA: Diagnosis present

## 2020-04-12 DIAGNOSIS — R0689 Other abnormalities of breathing: Secondary | ICD-10-CM | POA: Diagnosis not present

## 2020-04-12 DIAGNOSIS — K219 Gastro-esophageal reflux disease without esophagitis: Secondary | ICD-10-CM | POA: Diagnosis present

## 2020-04-12 DIAGNOSIS — M5030 Other cervical disc degeneration, unspecified cervical region: Secondary | ICD-10-CM | POA: Diagnosis present

## 2020-04-12 DIAGNOSIS — Z9119 Patient's noncompliance with other medical treatment and regimen: Secondary | ICD-10-CM

## 2020-04-12 DIAGNOSIS — Z981 Arthrodesis status: Secondary | ICD-10-CM

## 2020-04-12 DIAGNOSIS — G9349 Other encephalopathy: Secondary | ICD-10-CM | POA: Diagnosis present

## 2020-04-12 DIAGNOSIS — I110 Hypertensive heart disease with heart failure: Secondary | ICD-10-CM | POA: Diagnosis not present

## 2020-04-12 DIAGNOSIS — E669 Obesity, unspecified: Secondary | ICD-10-CM | POA: Diagnosis present

## 2020-04-12 DIAGNOSIS — E66813 Obesity, class 3: Secondary | ICD-10-CM | POA: Diagnosis present

## 2020-04-12 DIAGNOSIS — E785 Hyperlipidemia, unspecified: Secondary | ICD-10-CM | POA: Diagnosis present

## 2020-04-12 DIAGNOSIS — E1159 Type 2 diabetes mellitus with other circulatory complications: Secondary | ICD-10-CM

## 2020-04-12 DIAGNOSIS — Z66 Do not resuscitate: Secondary | ICD-10-CM | POA: Diagnosis present

## 2020-04-12 DIAGNOSIS — J449 Chronic obstructive pulmonary disease, unspecified: Secondary | ICD-10-CM | POA: Diagnosis present

## 2020-04-12 DIAGNOSIS — F32A Depression, unspecified: Secondary | ICD-10-CM | POA: Diagnosis present

## 2020-04-12 DIAGNOSIS — I152 Hypertension secondary to endocrine disorders: Secondary | ICD-10-CM

## 2020-04-12 DIAGNOSIS — Z87891 Personal history of nicotine dependence: Secondary | ICD-10-CM

## 2020-04-12 DIAGNOSIS — I5033 Acute on chronic diastolic (congestive) heart failure: Secondary | ICD-10-CM | POA: Diagnosis present

## 2020-04-12 DIAGNOSIS — Z9981 Dependence on supplemental oxygen: Secondary | ICD-10-CM

## 2020-04-12 DIAGNOSIS — Z7983 Long term (current) use of bisphosphonates: Secondary | ICD-10-CM

## 2020-04-12 DIAGNOSIS — Z7901 Long term (current) use of anticoagulants: Secondary | ICD-10-CM

## 2020-04-12 DIAGNOSIS — Z95810 Presence of automatic (implantable) cardiac defibrillator: Secondary | ICD-10-CM

## 2020-04-12 LAB — BASIC METABOLIC PANEL
Anion gap: 10 (ref 5–15)
BUN: 25 mg/dL — ABNORMAL HIGH (ref 8–23)
CO2: 35 mmol/L — ABNORMAL HIGH (ref 22–32)
Calcium: 8.9 mg/dL (ref 8.9–10.3)
Chloride: 98 mmol/L (ref 98–111)
Creatinine, Ser: 1.07 mg/dL — ABNORMAL HIGH (ref 0.44–1.00)
GFR, Estimated: 53 mL/min — ABNORMAL LOW (ref >60)
Glucose, Bld: 155 mg/dL — ABNORMAL HIGH (ref 70–99)
Potassium: 3.4 mmol/L — ABNORMAL LOW (ref 3.5–5.1)
Sodium: 143 mmol/L (ref 135–145)

## 2020-04-12 LAB — HEPATIC FUNCTION PANEL
ALT: 9 U/L (ref 0–44)
AST: 13 U/L — ABNORMAL LOW (ref 15–41)
Albumin: 3 g/dL — ABNORMAL LOW (ref 3.5–5.0)
Alkaline Phosphatase: 55 U/L (ref 38–126)
Bilirubin, Direct: 0.1 mg/dL (ref 0.0–0.2)
Indirect Bilirubin: 0.5 mg/dL (ref 0.3–0.9)
Total Bilirubin: 0.6 mg/dL (ref 0.3–1.2)
Total Protein: 6.8 g/dL (ref 6.5–8.1)

## 2020-04-12 LAB — GLUCOSE, CAPILLARY: Glucose-Capillary: 109 mg/dL — ABNORMAL HIGH (ref 70–99)

## 2020-04-12 LAB — AMMONIA: Ammonia: 18 umol/L (ref 9–35)

## 2020-04-12 LAB — CBC
HCT: 34.1 % — ABNORMAL LOW (ref 36.0–46.0)
Hemoglobin: 9.9 g/dL — ABNORMAL LOW (ref 12.0–15.0)
MCH: 28.8 pg (ref 26.0–34.0)
MCHC: 29 g/dL — ABNORMAL LOW (ref 30.0–36.0)
MCV: 99.1 fL (ref 80.0–100.0)
Platelets: 194 10*3/uL (ref 150–400)
RBC: 3.44 MIL/uL — ABNORMAL LOW (ref 3.87–5.11)
RDW: 13.4 % (ref 11.5–15.5)
WBC: 11.5 10*3/uL — ABNORMAL HIGH (ref 4.0–10.5)
nRBC: 0 % (ref 0.0–0.2)

## 2020-04-12 LAB — BLOOD GAS, VENOUS
Acid-Base Excess: 12.6 mmol/L — ABNORMAL HIGH (ref 0.0–2.0)
Bicarbonate: 41.7 mmol/L — ABNORMAL HIGH (ref 20.0–28.0)
O2 Saturation: 74.2 %
Patient temperature: 37
pCO2, Ven: 81 mmHg (ref 44.0–60.0)
pH, Ven: 7.32 (ref 7.250–7.430)
pO2, Ven: 43 mmHg (ref 32.0–45.0)

## 2020-04-12 LAB — LACTIC ACID, PLASMA
Lactic Acid, Venous: 0.9 mmol/L (ref 0.5–1.9)
Lactic Acid, Venous: 0.8 mmol/L (ref 0.5–1.9)

## 2020-04-12 LAB — BRAIN NATRIURETIC PEPTIDE: B Natriuretic Peptide: 111.8 pg/mL — ABNORMAL HIGH (ref 0.0–100.0)

## 2020-04-12 LAB — PROCALCITONIN: Procalcitonin: <0.1 ng/mL

## 2020-04-12 MED ORDER — IPRATROPIUM-ALBUTEROL 0.5-2.5 (3) MG/3ML IN SOLN
3.0000 mL | Freq: Once | RESPIRATORY_TRACT | Status: AC
  Administered 2020-04-12: 3 mL via RESPIRATORY_TRACT
  Filled 2020-04-12: qty 3

## 2020-04-12 MED ORDER — SERTRALINE HCL 100 MG PO TABS
100.0000 mg | ORAL_TABLET | Freq: Every day | ORAL | Status: DC
  Administered 2020-04-13 – 2020-04-17 (×5): 100 mg via ORAL
  Filled 2020-04-12 (×5): qty 2

## 2020-04-12 MED ORDER — DIAZEPAM 2 MG PO TABS: 2.0000 mg | ORAL_TABLET | Freq: Two times a day (BID) | ORAL | Status: DC | PRN

## 2020-04-12 MED ORDER — ONDANSETRON HCL 4 MG PO TABS: 4.0000 mg | ORAL_TABLET | Freq: Four times a day (QID) | ORAL | Status: DC | PRN

## 2020-04-12 MED ORDER — LACTATED RINGERS IV BOLUS: 1000.0000 mL | Freq: Once | INTRAVENOUS | Status: DC

## 2020-04-12 MED ORDER — NORTRIPTYLINE HCL 10 MG PO CAPS: 10.0000 mg | ORAL_CAPSULE | Freq: Every day | ORAL | Status: DC

## 2020-04-12 MED ORDER — VITAMIN B-12 1000 MCG PO TABS
1000.0000 ug | ORAL_TABLET | Freq: Every day | ORAL | Status: DC
  Administered 2020-04-13 – 2020-04-17 (×5): 1000 ug via ORAL
  Filled 2020-04-12 (×6): qty 1

## 2020-04-12 MED ORDER — LACTATED RINGERS IV BOLUS: 500.0000 mL | Freq: Once | INTRAVENOUS | Status: DC

## 2020-04-12 MED ORDER — APIXABAN 5 MG PO TABS
5.0000 mg | ORAL_TABLET | Freq: Two times a day (BID) | ORAL | Status: DC
  Administered 2020-04-12 – 2020-04-17 (×10): 5 mg via ORAL
  Filled 2020-04-12 (×10): qty 1

## 2020-04-12 MED ORDER — ROSUVASTATIN CALCIUM 10 MG PO TABS
10.0000 mg | ORAL_TABLET | Freq: Every day | ORAL | Status: DC
Start: 2020-04-12 — End: 2020-04-17
  Administered 2020-04-12 – 2020-04-16 (×5): 10 mg via ORAL
  Filled 2020-04-12 (×6): qty 1

## 2020-04-12 MED ORDER — FUROSEMIDE 10 MG/ML IJ SOLN
60.0000 mg | Freq: Once | INTRAMUSCULAR | Status: AC
  Administered 2020-04-15: 60 mg via INTRAVENOUS
  Filled 2020-04-12 (×2): qty 6

## 2020-04-12 MED ORDER — ACETAMINOPHEN 650 MG RE SUPP: 325.0000 mg | Freq: Four times a day (QID) | RECTAL | Status: AC | PRN

## 2020-04-12 MED ORDER — ADULT MULTIVITAMIN W/MINERALS CH
1.0000 | ORAL_TABLET | Freq: Every day | ORAL | Status: DC
  Administered 2020-04-13 – 2020-04-17 (×5): 1 via ORAL
  Filled 2020-04-12 (×4): qty 1

## 2020-04-12 MED ORDER — QUINAPRIL HCL 10 MG PO TABS
40.0000 mg | ORAL_TABLET | Freq: Every day | ORAL | Status: DC
  Administered 2020-04-13 – 2020-04-17 (×5): 40 mg via ORAL
  Filled 2020-04-12 (×6): qty 4

## 2020-04-12 MED ORDER — ACETAMINOPHEN 325 MG PO TABS
325.0000 mg | ORAL_TABLET | Freq: Four times a day (QID) | ORAL | Status: AC | PRN
  Administered 2020-04-13: 12:00:00 325 mg via ORAL
  Filled 2020-04-12: qty 1

## 2020-04-12 MED ORDER — INSULIN ASPART 100 UNIT/ML ~~LOC~~ SOLN
0.0000 [IU] | Freq: Every day | SUBCUTANEOUS | Status: DC
  Administered 2020-04-16: 2 [IU] via SUBCUTANEOUS
  Filled 2020-04-12: qty 1

## 2020-04-12 MED ORDER — PANTOPRAZOLE SODIUM 40 MG PO TBEC
40.0000 mg | DELAYED_RELEASE_TABLET | Freq: Every day | ORAL | Status: DC
  Administered 2020-04-13 – 2020-04-17 (×5): 40 mg via ORAL
  Filled 2020-04-12 (×5): qty 1

## 2020-04-12 MED ORDER — INSULIN ASPART 100 UNIT/ML ~~LOC~~ SOLN
0.0000 [IU] | Freq: Three times a day (TID) | SUBCUTANEOUS | Status: DC
  Administered 2020-04-13 – 2020-04-15 (×5): 2 [IU] via SUBCUTANEOUS
  Administered 2020-04-15: 8 [IU] via SUBCUTANEOUS
  Administered 2020-04-16 (×2): 3 [IU] via SUBCUTANEOUS
  Administered 2020-04-16: 2 [IU] via SUBCUTANEOUS
  Filled 2020-04-12 (×9): qty 1

## 2020-04-12 MED ORDER — METFORMIN HCL 500 MG PO TABS
500.0000 mg | ORAL_TABLET | Freq: Two times a day (BID) | ORAL | Status: DC
  Filled 2020-04-12: qty 1

## 2020-04-12 MED ORDER — ONDANSETRON HCL 4 MG/2ML IJ SOLN: 4.0000 mg | Freq: Four times a day (QID) | INTRAMUSCULAR | Status: DC | PRN

## 2020-04-12 MED ORDER — CARVEDILOL 6.25 MG PO TABS
12.5000 mg | ORAL_TABLET | Freq: Two times a day (BID) | ORAL | Status: DC
  Administered 2020-04-13 – 2020-04-17 (×8): 12.5 mg via ORAL
  Filled 2020-04-12 (×4): qty 1
  Filled 2020-04-12: qty 2
  Filled 2020-04-12 (×2): qty 1
  Filled 2020-04-12: qty 2
  Filled 2020-04-12: qty 1

## 2020-04-12 MED ORDER — SODIUM CHLORIDE 0.9 % IV BOLUS
500.0000 mL | Freq: Once | INTRAVENOUS | Status: AC
  Administered 2020-04-12: 500 mL via INTRAVENOUS

## 2020-04-12 NOTE — ED Provider Notes (Signed)
[facility] Emergency Department Provider Note  ____________________________________________   Event Date/Time   First MD Initiated Contact with Patient 04/12/20 1418     (approximate)  I have reviewed the triage vital signs and the nursing notes.   HISTORY  Chief Complaint Fall and Extremity Weakness    HPI [name] is a 80 y.o. female with past medical history of CHF, A. fib, chronic hypoxia, here with increasing confusion and difficulty caring for self.  The patient has had cough and sputum production for the last week.  She was seen at urgent care and given prednisone as well as Augmentin.  According to her friend, she is not sure if the patient has been remembering to take this as prescribed.  The patient has been increasingly weak and confused and essentially unable to get out of her chair for the last 2 days.  She has not been able to get to her kitchen to take care of her self.  She is normally independent.  On my assessment, the patient states that she just feels tired.  She does endorse some shortness of breath with exertion.  No chest pain.  She does not recall any fevers.  Denies any urinary symptoms.        Past Medical History:  Diagnosis Date  . AICD (automatic cardioverter/defibrillator) present    PLACED BY  DR  Graciela Husbands 2008  . Angina    5-6 YRS AGO  . Anxiety   . Arthritis   . Atrial fibrillation (HCC)   . CHF (congestive heart failure) (HCC)   . Complication of anesthesia    states at times had a tough time waking up  . Diabetes mellitus   . Diverticulum of esophagus    large diverticulum in the middle third of the esophagus [facility] EGD '09)  . Dysrhythmia    A-FIB  . Fibromyalgia   . Headache(784.0)   . Heartburn   . History of stomach ulcers   . History of transient ischemic attack (TIA)    LAST ONE IN  2005  IN  [place], [place]  . HLD (hyperlipidemia)   . Hypertension   . Shortness of breath    EASING OFF NOW  . Sleep  apnea    DOESN'T KNOW SETTINGS  . Stroke (cerebrum) (HCC)   . TIA (transient ischemic attack)     Patient Active Problem List   Diagnosis Date Noted  . Weakness 04/12/2020  . Overdose of coumadin, accidental or unintentional, initial encounter 07/29/2019  . 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 Piriformis syndrome 06/18/2014  . Facet syndrome, lumbar 06/18/2014  . 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 DDD (degenerative disc disease), lumbar 06/18/2014  . DDD (degenerative disc disease), cervical 06/08/2014  . 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 Cervical spinal stenosis 04/09/2011    Past Surgical History:  Procedure Laterality Date  . ANTERIOR CERVICAL DECOMP/DISCECTOMY FUSION  04/09/2011   Procedure: ANTERIOR CERVICAL DECOMPRESSION/DISCECTOMY FUSION 1 LEVEL;  Surgeon: Temple Pacini, MD;  Location: [facility]UR[facility] ORS;  Service: Neurosurgery;  Laterality: N/A;  Cervical five-six Anterior Cervical Decompression and Fusion  . APPENDECTOMY    . BILATERAL OOPHORECTOMY     BENIGN  . CARDIAC CATHETERIZATION     X 2    LAST ONE  2009  . CESAREAN SECTION     X  4  . CHOLECYSTECTOMY    . IMPLANTABLE CARDIOVERTER DEFIBRILLATOR (ICD) GENERATOR CHANGE Left 01/18/2019  Procedure: ICD GENERATOR CHANGE;  Surgeon: Marcina Millard, MD;  Location: [facility] ORS;  Service: Cardiovascular;  Laterality: Left;  . INSERT / REPLACE / REMOVE PACEMAKER     ST  JUDE  DEVICE  . JOINT REPLACEMENT     BIL  KNEES  . TONSILLECTOMY    . TUBAL LIGATION      Prior to Admission medications   Medication Sig Start Date End Date Taking? Authorizing Provider  alendronate (FOSAMAX) 70 MG tablet Take 70 mg by mouth every Monday.     [provider]  carvedilol (COREG) 12.5 MG tablet Take 12.5 mg by mouth 2 (two) times daily with a meal.    [provider]  Cholecalciferol 25 MCG (1000 UT) tablet Take 1,000 Units by mouth daily.     [provider]  Cyanocobalamin 1000 MCG TBCR Take 1,000 mcg by mouth daily.     [provider]  diazepam (VALIUM) 2 MG tablet Take 2 mg by mouth 2 (two) times daily. 09/21/19   [provider]  ELIQUIS 5 MG TABS tablet Take 5 mg by mouth 2 (two) times daily. 09/21/19   [provider]  furosemide (LASIX) 20 MG tablet Take 40 mg by mouth daily.    [provider]  gabapentin (NEURONTIN) 100 MG capsule Take 200 mg by mouth 3 (three) times daily.  11/30/13   [provider]  meclizine (ANTIVERT) 25 MG tablet Take 25 mg by mouth 3 (three) times daily as needed for dizziness.    [provider]  metFORMIN (GLUCOPHAGE) 500 MG tablet Take 500 mg by mouth 2 (two) times daily with a meal.    [provider]  Multiple Vitamins-Minerals (MULTIVITAMIN WITH MINERALS) tablet Take 1 tablet by mouth daily. Patient not taking: Reported on 01/26/2020    [provider]  nortriptyline (PAMELOR) 10 MG capsule Take 10-20 mg by mouth See admin instructions. Take 1 capsule (10mg ) by mouth nightly for 1 week then take 2 capsules (20mg ) by mouth nightly Patient not taking: Reported on 01/26/2020 07/27/19   [provider]  omeprazole (PRILOSEC) 20 MG capsule Take 20 mg by mouth daily.    [provider]  pioglitazone (ACTOS) 15 MG tablet Take 15 mg by mouth daily.    [provider]  quinapril (ACCUPRIL) 40 MG tablet Take 40 mg by mouth daily.     [provider]  rosuvastatin (CRESTOR) 10 MG tablet Take 10 mg by mouth daily.    [provider]  sertraline (ZOLOFT) 100 MG tablet Take 100 mg by mouth daily.    [provider]  triamcinolone cream (KENALOG) 0.1 % Apply 1 application topically daily as needed (Wound).  01/06/17   [provider]    Allergies Ciprofloxacin hcl, Dopamine, Sulfa antibiotics, Sulfacetamide sodium, and Tegaderm ag mesh [silver]  Family History  Problem Relation Age of Onset   . Kidney disease Neg Hx   . Bladder Cancer Neg Hx     Social History Social History   Tobacco Use  . Smoking status: Former Smoker    Packs/day: 1.00    Years: 20.00    Pack years: 20.00    Types: Cigarettes    Quit date: 03/19/1991    Years since quitting: 29.0  . Smokeless tobacco: Never Used  Vaping Use  . Vaping Use: Never used  Substance Use Topics  . Alcohol use: No  . Drug use: No    Review of Systems  Review of  Systems  Constitutional: Positive for fatigue. Negative for chills and fever.  HENT: Negative for sore throat.   Respiratory: Negative for shortness of breath.   Cardiovascular: Negative for chest pain.  Gastrointestinal: Negative for abdominal pain.  Genitourinary: Negative for flank pain.  Musculoskeletal: Negative for neck pain.  Skin: Negative for rash and wound.  Allergic/Immunologic: Negative for immunocompromised state.  Neurological: Positive for weakness. Negative for numbness.  Hematological: Does not bruise/bleed easily.  Psychiatric/Behavioral: Positive for confusion.  All other systems reviewed and are negative.    ____________________________________________  PHYSICAL EXAM:      VITAL SIGNS: ED Triage Vitals  Enc Vitals Group     BP 04/12/20 1241 (!) 117/58     Pulse Rate 04/12/20 1241 70     Resp 04/12/20 1241 (!) 24     Temp 04/12/20 1241 97.7 F (36.5 C)     Temp Source 04/12/20 1241 Oral     SpO2 04/12/20 1241 100 %     Weight 04/12/20 1310 255 lb (115.7 kg)     Height 04/12/20 1310 5\' 3"  (1.6 m)     Head Circumference --      Peak Flow --      Pain Score 04/12/20 1309 6     Pain Loc --      Pain Edu? --      Excl. in GC? --      Physical Exam Vitals and nursing note reviewed.  Constitutional:      General: She is not in acute distress.    Appearance: She is well-developed.  HENT:     Head: Normocephalic and atraumatic.  Eyes:     Conjunctiva/sclera: Conjunctivae normal.  Cardiovascular:     Rate and Rhythm:  Normal rate and regular rhythm.     Heart sounds: Normal heart sounds.  Pulmonary:     Effort: Pulmonary effort is normal. Tachypnea present. No respiratory distress.     Breath sounds: Decreased air movement present. Wheezing present.  Abdominal:     General: There is no distension.  Musculoskeletal:     Cervical back: Neck supple.  Skin:    General: Skin is warm.     Capillary Refill: Capillary refill takes less than 2 seconds.     Findings: No rash.  Neurological:     Mental Status: She is alert. She is disoriented.     Motor: No abnormal muscle tone.     Comments: Occasionally confused.  Moves all extremities with 5 and 5 strength.  Normal sensation light touch.  Cranial nerves intact.       ____________________________________________   LABS (all labs ordered are listed, but only abnormal results are displayed)  Labs Reviewed  BASIC METABOLIC PANEL - Abnormal; Notable for the following components:      Result Value   Potassium 3.4 (*)    CO2 35 (*)    Glucose, Bld 155 (*)    BUN 25 (*)    Creatinine, Ser 1.07 (*)    GFR, Estimated 53 (*)    All other components within normal limits  CBC - Abnormal; Notable for the following components:   WBC 11.5 (*)    RBC 3.44 (*)    Hemoglobin 9.9 (*)    HCT 34.1 (*)    MCHC 29.0 (*)    All other components within normal limits  BLOOD GAS, VENOUS - Abnormal; Notable for the following components:   pCO2, Ven 81 (*)    Bicarbonate 41.7 (*)  Acid-Base Excess 12.6 (*)    All other components within normal limits  HEPATIC FUNCTION PANEL - Abnormal; Notable for the following components:   Albumin 3.0 (*)    AST 13 (*)    All other components within normal limits  C DIFFICILE QUICK SCREEN W PCR REFLEX  GASTROINTESTINAL PANEL BY PCR, STOOL (REPLACES STOOL CULTURE)  SARS CORONAVIRUS 2 (TAT 6-24 HRS)  LACTIC ACID, PLASMA  URINALYSIS, COMPLETE (UACMP) WITH MICROSCOPIC  LACTIC ACID, PLASMA  AMMONIA  PROCALCITONIN   HEMOGLOBIN A1C  BASIC METABOLIC PANEL  CBC  MAGNESIUM    ____________________________________________  EKG: Normal sinus rhythm, VR 70. QRS 143, QTc 491. Nonspecific IVCD. No St elevations. ________________________________________  RADIOLOGY All imaging, including plain films, CT scans, and ultrasounds, independently reviewed by me, and interpretations confirmed via formal radiology reads.  ED MD interpretation:   X-ray wrist: Negative CXR: Pending CT Head: Pending  Official radiology report(s): DG Chest 2 View  Result Date: 04/12/2020 CLINICAL DATA:  Weakness EXAM: CHEST - 2 VIEW COMPARISON:  None. FINDINGS: The heart size and mediastinal contours are mildly enlarged. Aortic knob calcifications are seen. There is prominence of the central pulmonary vasculature. Calcified granuloma is noted within the right lung base. A left-sided pacemaker seen with the lead tips in the right atrium right ventricle. The visualized skeletal structures are unremarkable. Cervical fixation is noted. IMPRESSION: Mild pulmonary vascular congestion. Electronically Signed   By: Jonna Clark M.D.   On: 04/12/2020 16:34   DG Wrist Complete Right  Result Date: 04/12/2020 CLINICAL DATA:  Right wrist pain after fall. EXAM: RIGHT WRIST - COMPLETE 3+ VIEW COMPARISON:  None. FINDINGS: There is no evidence of fracture or dislocation. There is no evidence of arthropathy or other focal bone abnormality. Osteopenia. Soft tissues are unremarkable. IMPRESSION: Negative. Electronically Signed   By: Obie Dredge M.D.   On: 04/12/2020 14:02   CT Head Wo Contrast  Result Date: 04/12/2020 CLINICAL DATA:  Status post fall. EXAM: CT HEAD WITHOUT CONTRAST TECHNIQUE: Contiguous axial images were obtained from the base of the skull through the vertex without intravenous contrast. COMPARISON:  October 25, 2019 FINDINGS: Brain: There is mild cerebral atrophy with widening of the extra-axial spaces and ventricular dilatation.  There are areas of decreased attenuation within the white matter tracts of the supratentorial brain, consistent with microvascular disease changes. Vascular: No hyperdense vessel or unexpected calcification. Skull: Normal. Negative for fracture or focal lesion. Sinuses/Orbits: There is mild sphenoid sinus and mild left maxillary sinus mucosal thickening. Moderate severity bilateral ethmoid sinus mucosal thickening is seen. Other: None. IMPRESSION: 1. Mild cerebral atrophy. 2. No acute intracranial abnormality. 3. Mild to moderate severity paranasal sinus disease. Electronically Signed   By: Aram Candela M.D.   On: 04/12/2020 16:04    ____________________________________________  PROCEDURES   Procedure(s) performed (including Critical Care):  .Critical Care Performed by: Shaune Pollack, MD Authorized by: Shaune Pollack, MD   Critical care provider statement:    Critical care time (minutes):  35   Critical care time was exclusive of:  Separately billable procedures and treating other patients and teaching time   Critical care was necessary to treat or prevent imminent or life-threatening deterioration of the following conditions:  Circulatory failure, cardiac failure and respiratory failure   Critical care was time spent personally by me on the following activities:  Development of treatment plan with patient or surrogate, discussions with consultants, evaluation of patient's response to treatment, examination of patient, obtaining history from patient  or surrogate, ordering and performing treatments and interventions, ordering and review of laboratory studies, ordering and review of radiographic studies, pulse oximetry, re-evaluation of patient's condition and review of old charts   I assumed direction of critical care for this patient from another provider in my specialty: no      ____________________________________________  INITIAL IMPRESSION / MDM / ASSESSMENT AND PLAN / ED  COURSE  As part of my medical decision making, I reviewed the following data within the electronic MEDICAL RECORD NUMBER Nursing notes reviewed and incorporated, Old chart reviewed, Notes from prior ED visits, and [place] Controlled Substance Database       *Lemar LoftySandra J Raffety was evaluated in Emergency Department on 04/12/2020 for the symptoms described in the history of present illness. She was evaluated in the context of the global COVID-19 pandemic, which necessitated consideration that the patient might be at risk for infection with the SARS-CoV-2 virus that causes COVID-19. Institutional protocols and algorithms that pertain to the evaluation of patients at risk for COVID-19 are in a state of rapid change based on information released by regulatory bodies including the CDC and federal and state organizations. These policies and algorithms were followed during the patient's care in the ED.  Some ED evaluations and interventions may be delayed as a result of limited staffing during the pandemic.*     Medical Decision Making:  80 yo F here with altered mental status, weakness, in setting of recent bronchitis diagnosis. No focal neuro deficits on my exam. Lab work shows significant elevation in her relative BUN/CR, likely from dehydration and poor PO intake. Moderate leukocytosis is likely related to recent bronchitis. She has chronic hypoxia but VBG is concerning for mild hypercapnea as well. Will place on BIPAP. Suspect acute encephalopathy 2/2 combination of obesity hypoventilation, bronchitis/PNA. No signs of sepsis.   ____________________________________________  FINAL CLINICAL IMPRESSION(S) / ED DIAGNOSES  Final diagnoses:  Encephalopathy  Hypercapnia  Generalized weakness  Acute on chronic respiratory failure with hypercapnia (HCC)     MEDICATIONS GIVEN DURING THIS VISIT:  Medications  furosemide (LASIX) injection 60 mg (has no administration in time range)  carvedilol (COREG) tablet 12.5 mg  (has no administration in time range)  quinapril (ACCUPRIL) tablet 40 mg (has no administration in time range)  rosuvastatin (CRESTOR) tablet 10 mg (has no administration in time range)  diazepam (VALIUM) tablet 2 mg (has no administration in time range)  nortriptyline (PAMELOR) capsule 10 mg (has no administration in time range)  sertraline (ZOLOFT) tablet 100 mg (has no administration in time range)  metFORMIN (GLUCOPHAGE) tablet 500 mg (has no administration in time range)  pantoprazole (PROTONIX) EC tablet 40 mg (has no administration in time range)  Cyanocobalamin TBCR 1,000 mcg (has no administration in time range)  apixaban (ELIQUIS) tablet 5 mg (has no administration in time range)  multivitamin with minerals tablet 1 tablet (has no administration in time range)  insulin aspart (novoLOG) injection 0-5 Units (has no administration in time range)  insulin aspart (novoLOG) injection 0-15 Units (has no administration in time range)  acetaminophen (TYLENOL) tablet 325 mg (has no administration in time range)    Or  acetaminophen (TYLENOL) suppository 325 mg (has no administration in time range)  ondansetron (ZOFRAN) tablet 4 mg (has no administration in time range)    Or  ondansetron (ZOFRAN) injection 4 mg (has no administration in time range)  ipratropium-albuterol (DUONEB) 0.5-2.5 (3) MG/3ML nebulizer solution 3 mL (3 mLs Nebulization Given 04/12/20 1541)  sodium chloride  0.9 % bolus 500 mL (500 mLs Intravenous New Bag/Given 04/12/20 1534)     ED Discharge Orders    None       Note:  This document was prepared using Dragon voice recognition software and may include unintentional dictation errors.   Shaune Pollack, MD 04/12/20 [phone]

## 2020-04-12 NOTE — ED Triage Notes (Signed)
Pt comes into the ED via [facility]  C/o fall out of her char at home.  Pt has known neuropathy and is c/o bilateral leg weakness.  Pt states she has some pain in the right wrist. Pt currently also being treated for bronchitis but has already been prescribed antibiotics.  Pt wears O2 at baseline.

## 2020-04-12 NOTE — ED Notes (Signed)
Pt to CT

## 2020-04-12 NOTE — ED Provider Notes (Incomplete)
[facility] Emergency Department Provider Note  ____________________________________________   Event Date/Time   First MD Initiated Contact with Patient 04/12/20 1418     (approximate)  I have reviewed the triage vital signs and the nursing notes.   HISTORY  Chief Complaint Fall and Extremity Weakness    HPI [name] is a 80 y.o. female with past medical history of CHF, A. fib, chronic hypoxia, here with increasing confusion and difficulty caring for self.  The patient has had cough and sputum production for the last week.  She was seen at urgent care and given prednisone as well as Augmentin.  According to her friend, she is not sure if the patient has been remembering to take this as prescribed.  The patient has been increasingly weak and confused and essentially unable to get out of her chair for the last 2 days.  She has not been able to get to her kitchen to take care of her self.  She is normally independent.  On my assessment, the patient states that she just feels tired.  She does endorse some shortness of breath with exertion.  No chest pain.  She does not recall any fevers.  Denies any urinary symptoms.        Past Medical History:  Diagnosis Date  . AICD (automatic cardioverter/defibrillator) present    PLACED BY  DR  Graciela Husbands 2008  . Angina    5-6 YRS AGO  . Anxiety   . Arthritis   . Atrial fibrillation (HCC)   . CHF (congestive heart failure) (HCC)   . Complication of anesthesia    states at times had a tough time waking up  . Diabetes mellitus   . Diverticulum of esophagus    large diverticulum in the middle third of the esophagus [facility] EGD '09)  . Dysrhythmia    A-FIB  . Fibromyalgia   . Headache(784.0)   . Heartburn   . History of stomach ulcers   . History of transient ischemic attack (TIA)    LAST ONE IN  2005  IN  [place], [place]  . HLD (hyperlipidemia)   . Hypertension   . Shortness of breath    EASING OFF NOW  . Sleep  apnea    DOESN'T KNOW SETTINGS  . Stroke (cerebrum) (HCC)   . TIA (transient ischemic attack)     Patient Active Problem List   Diagnosis Date Noted  . Overdose of coumadin, accidental or unintentional, initial encounter 07/29/2019  . 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 Piriformis syndrome 06/18/2014  . Facet syndrome, lumbar 06/18/2014  . 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 DDD (degenerative disc disease), lumbar 06/18/2014  . DDD (degenerative disc disease), cervical 06/08/2014  . 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 Cervical spinal stenosis 04/09/2011    Past Surgical History:  Procedure Laterality Date  . ANTERIOR CERVICAL DECOMP/DISCECTOMY FUSION  04/09/2011   Procedure: ANTERIOR CERVICAL DECOMPRESSION/DISCECTOMY FUSION 1 Burton;  Surgeon: Temple Pacini, MD;  Location: [facility];  Service: Neurosurgery;  Laterality: N/A;  Cervical five-six Anterior Cervical Decompression and Fusion  . APPENDECTOMY    . BILATERAL OOPHORECTOMY     BENIGN  . CARDIAC CATHETERIZATION     X 2    LAST ONE  2009  . CESAREAN SECTION     X  4  . CHOLECYSTECTOMY    . IMPLANTABLE CARDIOVERTER DEFIBRILLATOR (ICD) GENERATOR CHANGE Left 01/18/2019   Procedure: ICD  GENERATOR CHANGE;  Surgeon: Marcina Millard, MD;  Location: [facility] ORS;  Service: Cardiovascular;  Laterality: Left;  . INSERT / REPLACE / REMOVE PACEMAKER     ST  JUDE  DEVICE  . JOINT REPLACEMENT     BIL  KNEES  . TONSILLECTOMY    . TUBAL LIGATION      Prior to Admission medications   Medication Sig Start Date End Date Taking? Authorizing Provider  alendronate (FOSAMAX) 70 MG tablet Take 70 mg by mouth every Monday.     [provider]  carvedilol (COREG) 12.5 MG tablet Take 12.5 mg by mouth 2 (two) times daily with a meal.    [provider]  Cholecalciferol 25 MCG (1000 UT) tablet Take 1,000 Units by mouth daily.     [provider]  Cyanocobalamin 1000  MCG TBCR Take 1,000 mcg by mouth daily.     [provider]  diazepam (VALIUM) 2 MG tablet Take 2 mg by mouth 2 (two) times daily. 09/21/19   [provider]  ELIQUIS 5 MG TABS tablet Take 5 mg by mouth 2 (two) times daily. 09/21/19   [provider]  furosemide (LASIX) 20 MG tablet Take 40 mg by mouth daily.    [provider]  gabapentin (NEURONTIN) 100 MG capsule Take 200 mg by mouth 3 (three) times daily.  11/30/13   [provider]  meclizine (ANTIVERT) 25 MG tablet Take 25 mg by mouth 3 (three) times daily as needed for dizziness.    [provider]  metFORMIN (GLUCOPHAGE) 500 MG tablet Take 500 mg by mouth 2 (two) times daily with a meal.    [provider]  Multiple Vitamins-Minerals (MULTIVITAMIN WITH MINERALS) tablet Take 1 tablet by mouth daily. Patient not taking: Reported on 01/26/2020    [provider]  nortriptyline (PAMELOR) 10 MG capsule Take 10-20 mg by mouth See admin instructions. Take 1 capsule (10mg ) by mouth nightly for 1 week then take 2 capsules (20mg ) by mouth nightly Patient not taking: Reported on 01/26/2020 07/27/19   [provider]  omeprazole (PRILOSEC) 20 MG capsule Take 20 mg by mouth daily.    [provider]  pioglitazone (ACTOS) 15 MG tablet Take 15 mg by mouth daily.    [provider]  quinapril (ACCUPRIL) 40 MG tablet Take 40 mg by mouth daily.     [provider]  rosuvastatin (CRESTOR) 10 MG tablet Take 10 mg by mouth daily.    [provider]  sertraline (ZOLOFT) 100 MG tablet Take 100 mg by mouth daily.    [provider]  triamcinolone cream (KENALOG) 0.1 % Apply 1 application topically daily as needed (Wound).  01/06/17   [provider]    Allergies Ciprofloxacin hcl, Dopamine, Sulfa antibiotics, Sulfacetamide sodium, and Tegaderm ag mesh [silver]  Family History  Problem Relation Age of Onset  . Kidney disease Neg  Hx   . Bladder Cancer Neg Hx     Social History Social History   Tobacco Use  . Smoking status: Former Smoker    Packs/day: 1.00    Years: 20.00    Pack years: 20.00    Types: Cigarettes    Quit date: 03/19/1991    Years since quitting: 29.0  . Smokeless tobacco: Never Used  Vaping Use  . Vaping Use: Never used  Substance Use Topics  . Alcohol use: No  . Drug use: No    Review of Systems  Review of Systems  Constitutional: Positive for fatigue. Negative for chills and fever.  HENT: Negative for sore throat.   Respiratory: Negative for shortness of breath.   Cardiovascular: Negative for chest pain.  Gastrointestinal: Negative for abdominal pain.  Genitourinary: Negative for flank pain.  Musculoskeletal: Negative for neck pain.  Skin: Negative for rash and wound.  Allergic/Immunologic: Negative for immunocompromised state.  Neurological: Positive for weakness. Negative for numbness.  Hematological: Does not bruise/bleed easily.  Psychiatric/Behavioral: Positive for confusion.  All other systems reviewed and are negative.    ____________________________________________  PHYSICAL EXAM:      VITAL SIGNS: ED Triage Vitals  Enc Vitals Group     BP 04/12/20 1241 (!) 117/58     Pulse Rate 04/12/20 1241 70     Resp 04/12/20 1241 (!) 24     Temp 04/12/20 1241 97.7 F (36.5 C)     Temp Source 04/12/20 1241 Oral     SpO2 04/12/20 1241 100 %     Weight 04/12/20 1310 255 lb (115.7 kg)     Height 04/12/20 1310 5\' 3"  (1.6 m)     Head Circumference --      Peak Flow --      Pain Score 04/12/20 1309 6     Pain Loc --      Pain Edu? --      Excl. in GC? --      Physical Exam Vitals and nursing note reviewed.  Constitutional:      General: She is not in acute distress.    Appearance: She is well-developed.  HENT:     Head: Normocephalic and atraumatic.  Eyes:     Conjunctiva/sclera: Conjunctivae normal.  Cardiovascular:     Rate and Rhythm: Normal rate and regular  rhythm.     Heart sounds: Normal heart sounds.  Pulmonary:     Effort: Pulmonary effort is normal. Tachypnea present. No respiratory distress.     Breath sounds: Decreased air movement present. Wheezing present.  Abdominal:     General: There is no distension.  Musculoskeletal:     Cervical back: Neck supple.  Skin:    General: Skin is warm.     Capillary Refill: Capillary refill takes less than 2 seconds.     Findings: No rash.  Neurological:     Mental Status: She is alert. She is disoriented.     Motor: No abnormal muscle tone.     Comments: Occasionally confused.  Moves all extremities with 5 and 5 strength.  Normal sensation light touch.  Cranial nerves intact.       ____________________________________________   LABS (all labs ordered are listed, but only abnormal results are displayed)  Labs Reviewed  BASIC METABOLIC PANEL - Abnormal; Notable for the following components:      Result Value   Potassium 3.4 (*)    CO2 35 (*)    Glucose, Bld 155 (*)    BUN 25 (*)    Creatinine, Ser 1.07 (*)    GFR, Estimated 53 (*)    All other components within normal limits  CBC - Abnormal; Notable for the following components:   WBC 11.5 (*)    RBC 3.44 (*)    Hemoglobin 9.9 (*)    HCT 34.1 (*)    MCHC 29.0 (*)    All other components within normal limits  URINALYSIS, COMPLETE (UACMP) WITH MICROSCOPIC  BLOOD GAS, VENOUS  LACTIC ACID, PLASMA  LACTIC ACID, PLASMA  HEPATIC FUNCTION PANEL    ____________________________________________  EKG:  ________________________________________  RADIOLOGY All imaging, including plain films, CT scans, and ultrasounds, independently reviewed by me, and interpretations confirmed via formal radiology reads.  ED MD interpretation:   X-ray wrist: Negative  Official radiology report(s): DG Wrist Complete Right  Result Date: 04/12/2020 CLINICAL DATA:  Right wrist pain after fall. EXAM: RIGHT WRIST - COMPLETE 3+ VIEW COMPARISON:  None.  FINDINGS: There is no evidence of fracture or dislocation. There is no evidence of arthropathy or other focal bone abnormality. Osteopenia. Soft tissues are unremarkable. IMPRESSION: Negative. Electronically Signed   By: Obie DredgeWilliam T Derry M.D.   On: 04/12/2020 14:02    ____________________________________________  PROCEDURES   Procedure(s) performed (including Critical Care):  Procedures  ____________________________________________  INITIAL IMPRESSION / MDM / ASSESSMENT AND PLAN / ED COURSE  As part of my medical decision making, I reviewed the following data within the electronic MEDICAL RECORD NUMBER Nursing notes reviewed and incorporated, Old chart reviewed, Notes from prior ED visits, and [place] Controlled Substance Database       *[name] was evaluated in Emergency Department on 04/12/2020 for the symptoms described in the history of present illness. She was evaluated in the context of the global COVID-19 pandemic, which necessitated consideration that the patient might be at risk for infection with the SARS-CoV-2 virus that causes COVID-19. Institutional protocols and algorithms that pertain to the evaluation of patients at risk for COVID-19 are in a state of rapid change based on information released by regulatory bodies including the CDC and federal and state organizations. These policies and algorithms were followed during the patient's care in the ED.  Some ED evaluations and interventions may be delayed as a result of limited staffing during the pandemic.*     Medical Decision Making: 80 year old female with history of chronic hypoxia, obesity, here with generalized weakness and confusion in the setting of diagnosis of bronchitis several days ago.  Patient does appear generally fatigued and dehydrated.  She has been having difficulty ambulating due to her weakness.  She is on her baseline oxygen.  She does have diffuse wheezing noted.  Will start with breathing treatments and obtain  chest x-ray for what I suspect is ongoing bronchitis versus pneumonia.  Blood gas also shows mild acute on chronic retention which I suspect is due to obesity hypoventilation as well as her decreased mobility and possibly a component of reactive airway disease/bronchitis.  Will place her on BiPAP.  CBC with mild leukocytosis consistent with infection.  BMP unremarkable.  Lactic acid normal.  Will plan to follow-up imaging and admit.  ____________________________________________  FINAL CLINICAL IMPRESSION(S) / ED DIAGNOSES  Final diagnoses:  None     MEDICATIONS GIVEN DURING THIS VISIT:  Medications  lactated ringers bolus 500 mL (has no administration in time range)     ED Discharge Orders    None       Note:  This document was prepared using Dragon voice recognition software and may include unintentional dictation errors.

## 2020-04-12 NOTE — ED Triage Notes (Signed)
Pt to ED via [facility], per [facility] pt slid out of chair at home. Per EMS pt put on Augmentin on 3/8 for bronchitis but pt has not been taking her abx. Per EMS pt c/o increasing bilateral leg weakness.    108/78 70HR 22RR 97% 2L chronic O2

## 2020-04-12 NOTE — H&P (Addendum)
History and Physical   [name]:[id number] DOB: February 03, 1941 DOA: 04/12/2020  PCP: Gracelyn Nurse, MD  Outpatient Specialists: Dr. Alphonzo Lemmings Patient coming from: Home  I have personally briefly reviewed patient's old medical records in [facility] EMR.  Chief Concern: Fall, debility  HPI: [name] is a 80 y.o. female with medical history significant for hypertension, morbid obesity, heart failure preserved ejection fraction (grade 3 diastolic dysfunction), hypertension, hyperlipidemia, non-insulin-dependent diabetes mellitus, metabolic syndrome, atrial fibrillation on Eliquis, presented to the emergency department from home for chief concerns of fall, confusion, shortness of breath.  She reports that at baseline she is able to perform her normal activities including dressing herself and preparing TV dinners meals for herself.  She reports that however in the last 2 days she has been increasingly weak, shortness of breath, so much so that she is not able to ambulate regularly.  She states that she is also been having increasing swelling of her lower extremity.  She states that she does not take her fluid pill regularly and instead she takes it as needed.  She reports diarrhea 2 x per day for two weeks.  She endorses weight gain.  She reports that she was getting up out of her chair and she felt so weak she felt right side.  She denies head trauma and loss of consciousness.  She endorses compliance with medications however told pharmacy that she may not be taking all of her medications.  Vaccination: fully vaccinated with booster  Social history: She lives by herself.  She does not smoke, drink alcohol, uses recreational drugs.  ROS: Constitutional: + weight change, no fever ENT/Mouth: no sore throat, no rhinorrhea Eyes: no eye pain, no vision changes Cardiovascular: no chest pain, + dyspnea,  + edema, no palpitations Respiratory: no cough, no sputum, no  wheezing Gastrointestinal: no nausea, no vomiting, no diarrhea, no constipation Genitourinary: no urinary incontinence, no dysuria, no hematuria Musculoskeletal: no arthralgias, no myalgias Skin: no skin lesions, no pruritus, Neuro: + weakness, no loss of consciousness, no syncope Psych: no anxiety, no depression, + decrease appetite Heme/Lymph: no bruising, no bleeding  ED Course: Discussed with ED provider, patient requiring hospitalization due to shortness of breath.  Temperature in the ED was 97.7, respiration rate of 14, heart rate of 69, blood pressure 111/59, satting at 100% on baseline 2 L nasal cannula.  She was placed on BiPAP for bilateral pulmonary congestion.  Assessment/Plan  Active Problems:   DDD (degenerative disc disease), cervical   Weakness   Obesity, Class III, BMI 40-49.9 (morbid obesity) (HCC)   Obesity, diabetes, and hypertension syndrome (HCC)   Metabolic syndrome   Weakness and shortness of breath History of heart failure reduced ejection fraction, grade 3 diastolic dysfunction -Suspect secondary to fluid overload -BiPAP was ordered in the emergency department which improved her symptoms -CPAP nightly ordered -Patient is not taking her Lasix daily -Checking BNP -Strict I's and O's  Questionable confusion -Patient was able to tell me her name and age, location, current calendar year -Checking ammonia  OSA-no CPAP at home -CPAP nightly ordered  Hypertension-Coreg 12.5 mg twice daily resumed, quinapril 40 mg daily, Lasix p.o. has been held -Furosemide 60 mg IV once -Strict I's and O's  Debility and weakness -I doubt patient can take care of herself and/or perform her ADLs -TOC, PT, OT  GERD-PPI resumed  History of A. fib-resumed home Eliquis 5 mg twice daily Non-insulin-dependent diabetes mellitus-resumed home Metformin 500 mg twice  daily -Actos has been held -Insulin SSI with at bedtime coverage  Depression/anxiety-sertraline 100 mg  daily, resumed home Valium 2 mg twice daily as needed for anxiety  Hyperlipidemia-rosuvastatin 10 mg nightly  Chart reviewed.   Echo on 10/27/2019: EF was read as 50%, apical hypokinesis, grade 3 diastolic dysfunction  DVT prophylaxis: Eliquis 5 mg twice daily Code Status: DNR Diet: Heart healthy/carb modified Family Communication: Patient states that family know she is here and she does not require a family communication Disposition Plan: Pending clinical course Consults called: No Admission status: Observation, MedSurg, with telemetry  Past Medical History:  Diagnosis Date  . AICD (automatic cardioverter/defibrillator) present    PLACED BY  DR  Graciela Husbands 2008  . Angina    5-6 YRS AGO  . Anxiety   . Arthritis   . Atrial fibrillation (HCC)   . CHF (congestive heart failure) (HCC)   . Complication of anesthesia    states at times had a tough time waking up  . Diabetes mellitus   . Diverticulum of esophagus    large diverticulum in the middle third of the esophagus [facility] EGD '09)  . Dysrhythmia    A-FIB  . Fibromyalgia   . Headache(784.0)   . Heartburn   . History of stomach ulcers   . History of transient ischemic attack (TIA)    LAST ONE IN  2005  IN  [place], [place]  . HLD (hyperlipidemia)   . Hypertension   . Shortness of breath    EASING OFF NOW  . Sleep apnea    DOESN'T KNOW SETTINGS  . Stroke (cerebrum) (HCC)   . TIA (transient ischemic attack)    Past Surgical History:  Procedure Laterality Date  . ANTERIOR CERVICAL DECOMP/DISCECTOMY FUSION  04/09/2011   Procedure: ANTERIOR CERVICAL DECOMPRESSION/DISCECTOMY FUSION 1 LEVEL;  Surgeon: Temple Pacini, MD;  Location: [facility];  Service: Neurosurgery;  Laterality: N/A;  Cervical five-six Anterior Cervical Decompression and Fusion  . APPENDECTOMY    . BILATERAL OOPHORECTOMY     BENIGN  . CARDIAC CATHETERIZATION     X 2    LAST ONE  2009  . CESAREAN SECTION     X  4  . CHOLECYSTECTOMY    . IMPLANTABLE CARDIOVERTER  DEFIBRILLATOR (ICD) GENERATOR CHANGE Left 01/18/2019   Procedure: ICD GENERATOR CHANGE;  Surgeon: Marcina Millard, MD;  Location: [facility] ORS;  Service: Cardiovascular;  Laterality: Left;  . INSERT / REPLACE / REMOVE PACEMAKER     ST  JUDE  DEVICE  . JOINT REPLACEMENT     BIL  KNEES  . TONSILLECTOMY    . TUBAL LIGATION     Social History:  reports that she quit smoking about 29 years ago. Her smoking use included cigarettes. She has a 20.00 pack-year smoking history. She has never used smokeless tobacco. She reports that she does not drink alcohol and does not use drugs.  Allergies  Allergen Reactions  . Ciprofloxacin Hcl Itching    Rash and itching all over stomach and arms  . Dopamine Nausea And Vomiting  . Sulfa Antibiotics Rash  . Sulfacetamide Sodium Rash  . Tegaderm Ag Mesh [Silver] Other (See Comments) and Rash    Blisters and takes skin off   Family History  Problem Relation Age of Onset  . Kidney disease Neg Hx   . Bladder Cancer Neg Hx    Family history: Family history reviewed and not pertinent  Prior to Admission medications   Medication Sig Start Date  End Date Taking? Authorizing Provider  alendronate (FOSAMAX) 70 MG tablet Take 70 mg by mouth every Monday.     [provider]  carvedilol (COREG) 12.5 MG tablet Take 12.5 mg by mouth 2 (two) times daily with a meal.    [provider]  Cholecalciferol 25 MCG (1000 UT) tablet Take 1,000 Units by mouth daily.     [provider]  Cyanocobalamin 1000 MCG TBCR Take 1,000 mcg by mouth daily.     [provider]  diazepam (VALIUM) 2 MG tablet Take 2 mg by mouth 2 (two) times daily. 09/21/19   [provider]  ELIQUIS 5 MG TABS tablet Take 5 mg by mouth 2 (two) times daily. 09/21/19   [provider]  furosemide (LASIX) 20 MG tablet Take 40 mg by mouth daily.    [provider]  gabapentin (NEURONTIN) 100 MG capsule Take 200 mg by mouth 3 (three) times daily.   11/30/13   [provider]  meclizine (ANTIVERT) 25 MG tablet Take 25 mg by mouth 3 (three) times daily as needed for dizziness.    [provider]  metFORMIN (GLUCOPHAGE) 500 MG tablet Take 500 mg by mouth 2 (two) times daily with a meal.    [provider]  Multiple Vitamins-Minerals (MULTIVITAMIN WITH MINERALS) tablet Take 1 tablet by mouth daily. Patient not taking: Reported on 01/26/2020    [provider]  nortriptyline (PAMELOR) 10 MG capsule Take 10-20 mg by mouth See admin instructions. Take 1 capsule (10mg ) by mouth nightly for 1 week then take 2 capsules (20mg ) by mouth nightly Patient not taking: Reported on 01/26/2020 07/27/19   [provider]  omeprazole (PRILOSEC) 20 MG capsule Take 20 mg by mouth daily.    [provider]  pioglitazone (ACTOS) 15 MG tablet Take 15 mg by mouth daily.    [provider]  quinapril (ACCUPRIL) 40 MG tablet Take 40 mg by mouth daily.     [provider]  rosuvastatin (CRESTOR) 10 MG tablet Take 10 mg by mouth daily.    [provider]  sertraline (ZOLOFT) 100 MG tablet Take 100 mg by mouth daily.    [provider]  triamcinolone cream (KENALOG) 0.1 % Apply 1 application topically daily as needed (Wound).  01/06/17   [provider]   Physical Exam: Vitals:   04/12/20 1810 04/12/20 1930 04/12/20 2000 04/12/20 2053  BP: 100/75 (!) 110/56  101/73  Pulse: 71 69  72  Resp: 14 18  17   Temp:    (!) 97.4 F (36.3 C)  TempSrc:    Oral  SpO2: 100% 99%  100%  Weight:   115.6 kg   Height:   5' 2.99" (1.6 m)    Constitutional: appears age-appropriate, NAD, calm, comfortable Eyes: PERRL, lids and conjunctivae normal ENMT: Mucous membranes are moist. Posterior pharynx clear of any exudate or lesions. Age-appropriate dentition. Hearing appropriate. Neck: normal, supple, no masses, no thyromegaly Respiratory: clear to auscultation bilaterally, no wheezing, no  crackles. Normal respiratory effort. No accessory muscle use.  Cardiovascular: Regular rate and rhythm, no murmurs / rubs / gallops.  Bilateral lower extremity 2+ pitting edema. 2+ pedal pulses. No carotid bruits.  Abdomen: Morbidly obese abdomen, no tenderness, no masses palpated, no hepatosplenomegaly. Bowel sounds positive.  Musculoskeletal: no clubbing / cyanosis. No joint deformity upper and lower extremities. Good ROM, no contractures, no atrophy. Normal muscle tone.  Skin: no rashes, lesions, ulcers. No induration Neurologic: Sensation intact.  Strength 5/5 in all 4.  Psychiatric: Normal judgment and insight. Alert and oriented x 3. Normal mood.   EKG: independently reviewed, showing paced rhythm, rate of 70, QTc 491  Chest x-ray on Admission: I personally reviewed and I agree with radiologist reading as below.   DG Chest 2 View  Result Date: 04/12/2020 CLINICAL DATA:  Weakness EXAM: CHEST - 2 VIEW COMPARISON:  None. FINDINGS: The heart size and mediastinal contours are mildly enlarged. Aortic knob calcifications are seen. There is prominence of the central pulmonary vasculature. Calcified granuloma is noted within the right lung base. A left-sided pacemaker seen with the lead tips in the right atrium right ventricle. The visualized skeletal structures are unremarkable. Cervical fixation is noted. IMPRESSION: Mild pulmonary vascular congestion. Electronically Signed   By: Jonna Clark M.D.   On: 04/12/2020 16:34   DG Wrist Complete Right  Result Date: 04/12/2020 CLINICAL DATA:  Right wrist pain after fall. EXAM: RIGHT WRIST - COMPLETE 3+ VIEW COMPARISON:  None. FINDINGS: There is no evidence of fracture or dislocation. There is no evidence of arthropathy or other focal bone abnormality. Osteopenia. Soft tissues are unremarkable. IMPRESSION: Negative. Electronically Signed   By: Obie Dredge M.D.   On: 04/12/2020 14:02   CT Head Wo Contrast  Result Date: 04/12/2020 CLINICAL DATA:  Status  post fall. EXAM: CT HEAD WITHOUT CONTRAST TECHNIQUE: Contiguous axial images were obtained from the base of the skull through the vertex without intravenous contrast. COMPARISON:  October 25, 2019 FINDINGS: Brain: There is mild cerebral atrophy with widening of the extra-axial spaces and ventricular dilatation. There are areas of decreased attenuation within the white matter tracts of the supratentorial brain, consistent with microvascular disease changes. Vascular: No hyperdense vessel or unexpected calcification. Skull: Normal. Negative for fracture or focal lesion. Sinuses/Orbits: There is mild sphenoid sinus and mild left maxillary sinus mucosal thickening. Moderate severity bilateral ethmoid sinus mucosal thickening is seen. Other: None. IMPRESSION: 1. Mild cerebral atrophy. 2. No acute intracranial abnormality. 3. Mild to moderate severity paranasal sinus disease. Electronically Signed   By: Aram Candela M.D.   On: 04/12/2020 16:04   Labs on Admission: I have personally reviewed following labs  CBC: Recent Labs  Lab 04/12/20 1312  WBC 11.5*  HGB 9.9*  HCT 34.1*  MCV 99.1  PLT 194   Basic Metabolic Panel: Recent Labs  Lab 04/12/20 1312  NA 143  K 3.4*  CL 98  CO2 35*  GLUCOSE 155*  BUN 25*  CREATININE 1.07*  CALCIUM 8.9   GFR: Estimated Creatinine Clearance: 54 mL/min (A) (by C-G formula based on SCr of 1.07 mg/dL (H)). Liver Function Tests: Recent Labs  Lab 04/12/20 1312  AST 13*  ALT 9  ALKPHOS 55  BILITOT 0.6  PROT 6.8  ALBUMIN 3.0*   No results for input(s): LIPASE, AMYLASE in the last 168 hours. Recent Labs  Lab 04/12/20 1745  AMMONIA 18   CBG: Recent Labs  Lab 04/12/20 2051  GLUCAP 109*   Urine analysis:    Component Value Date/Time   COLORURINE Yellow 12/07/2012 1209   APPEARANCEUR Cloudy (A) 02/17/2018 1247   LABSPEC 1.012 12/07/2012 1209   PHURINE 6.0 12/07/2012 1209   GLUCOSEU Negative 02/17/2018 1247   GLUCOSEU Negative 12/07/2012  1209   HGBUR 1+ 12/07/2012 1209   BILIRUBINUR Negative 02/17/2018 1247   BILIRUBINUR Negative 12/07/2012 1209   KETONESUR Negative 12/07/2012 1209   PROTEINUR 1+ (A) 02/17/2018 1247   PROTEINUR Negative 12/07/2012 1209  NITRITE Negative 02/17/2018 1247   NITRITE Positive 12/07/2012 1209   LEUKOCYTESUR 1+ (A) 02/17/2018 1247   LEUKOCYTESUR 1+ 12/07/2012 1209   Jaques Mineer N Omaya Nieland D.O. [facility]  If 7PM-7AM, please contact overnight-coverage provider If 7AM-7PM, please contact day coverage provider [email or web address]  04/12/2020, 11:06 PM

## 2020-04-12 NOTE — ED Provider Notes (Signed)
----------------------------------------- °  4:58 PM on 04/12/2020 -----------------------------------------  I took over care on this patient from Dr. Erma Heritage.  The patient was placed on BiPAP due to hypercapnia.  She is doing well on this and appears comfortable.  Chest x-ray shows some vascular congestion but no frank edema and no evidence of pneumonia.  Differential includes new onset COPD, acute bronchitis, or less likely new onset CHF.  I consulted Dr. Sedalia Muta from the hospitalist service for admission.   Dionne Bucy, MD 04/12/20 1700

## 2020-04-13 DIAGNOSIS — J9622 Acute and chronic respiratory failure with hypercapnia: Secondary | ICD-10-CM

## 2020-04-13 DIAGNOSIS — G9340 Encephalopathy, unspecified: Secondary | ICD-10-CM

## 2020-04-13 LAB — MAGNESIUM: Magnesium: 1.6 mg/dL — ABNORMAL LOW (ref 1.7–2.4)

## 2020-04-13 LAB — BASIC METABOLIC PANEL
Anion gap: 8 (ref 5–15)
BUN: 18 mg/dL (ref 8–23)
CO2: 37 mmol/L — ABNORMAL HIGH (ref 22–32)
Calcium: 8.7 mg/dL — ABNORMAL LOW (ref 8.9–10.3)
Chloride: 98 mmol/L (ref 98–111)
Creatinine, Ser: 0.66 mg/dL (ref 0.44–1.00)
GFR, Estimated: >60 mL/min (ref >60)
Glucose, Bld: 167 mg/dL — ABNORMAL HIGH (ref 70–99)
Potassium: 3.6 mmol/L (ref 3.5–5.1)
Sodium: 143 mmol/L (ref 135–145)

## 2020-04-13 LAB — BLOOD GAS, VENOUS
Acid-Base Excess: 21.1 mmol/L — ABNORMAL HIGH (ref 0.0–2.0)
Bicarbonate: 49 mmol/L — ABNORMAL HIGH (ref 20.0–28.0)
FIO2: 0.32
O2 Saturation: 74.4 %
Patient temperature: 37
pCO2, Ven: 81 mmHg (ref 44.0–60.0)
pH, Ven: 7.39 (ref 7.250–7.430)
pO2, Ven: 40 mmHg (ref 32.0–45.0)

## 2020-04-13 LAB — CBC
HCT: 32 % — ABNORMAL LOW (ref 36.0–46.0)
Hemoglobin: 9.7 g/dL — ABNORMAL LOW (ref 12.0–15.0)
MCH: 29.5 pg (ref 26.0–34.0)
MCHC: 30.3 g/dL (ref 30.0–36.0)
MCV: 97.3 fL (ref 80.0–100.0)
Platelets: 195 10*3/uL (ref 150–400)
RBC: 3.29 MIL/uL — ABNORMAL LOW (ref 3.87–5.11)
RDW: 13.2 % (ref 11.5–15.5)
WBC: 9.9 10*3/uL (ref 4.0–10.5)
nRBC: 0 % (ref 0.0–0.2)

## 2020-04-13 LAB — GLUCOSE, CAPILLARY
Glucose-Capillary: 157 mg/dL — ABNORMAL HIGH (ref 70–99)
Glucose-Capillary: 114 mg/dL — ABNORMAL HIGH (ref 70–99)
Glucose-Capillary: 144 mg/dL — ABNORMAL HIGH (ref 70–99)
Glucose-Capillary: 150 mg/dL — ABNORMAL HIGH (ref 70–99)

## 2020-04-13 LAB — SARS CORONAVIRUS 2 (TAT 6-24 HRS): SARS Coronavirus 2: NEGATIVE

## 2020-04-13 LAB — HEMOGLOBIN A1C
Hgb A1c MFr Bld: 5.8 % — ABNORMAL HIGH (ref 4.8–5.6)
Mean Plasma Glucose: 119.76 mg/dL

## 2020-04-13 MED ORDER — MAGNESIUM SULFATE 2 GM/50ML IV SOLN
2.0000 g | Freq: Once | INTRAVENOUS | Status: AC
  Administered 2020-04-13: 18:00:00 2 g via INTRAVENOUS
  Filled 2020-04-13: qty 50

## 2020-04-13 MED ORDER — ALBUTEROL SULFATE HFA 108 (90 BASE) MCG/ACT IN AERS
2.0000 | INHALATION_SPRAY | RESPIRATORY_TRACT | Status: DC | PRN
  Filled 2020-04-13: qty 6.7

## 2020-04-13 MED ORDER — POTASSIUM CHLORIDE CRYS ER 20 MEQ PO TBCR
20.0000 meq | EXTENDED_RELEASE_TABLET | Freq: Once | ORAL | Status: AC
  Administered 2020-04-13: 20 meq via ORAL
  Filled 2020-04-13: qty 1

## 2020-04-13 MED ORDER — GABAPENTIN 100 MG PO CAPS
200.0000 mg | ORAL_CAPSULE | Freq: Three times a day (TID) | ORAL | Status: DC
  Administered 2020-04-13 – 2020-04-17 (×10): 200 mg via ORAL
  Filled 2020-04-13 (×11): qty 2

## 2020-04-13 NOTE — Progress Notes (Signed)
   04/13/20 0930  Clinical Encounter Type  Visited With Patient  Visit Type Initial;Spiritual support;Social support  Referral From Nurse  Consult/Referral To Chaplain  Ch visited with Pt per OR for prayer. Pt was alert and talking. Pt ask me to pray God strengthen her body. Pt wants to go home to be with her grandchild. I prayed that God strengthen her body. Ch will follow up later.

## 2020-04-13 NOTE — Progress Notes (Signed)
[facility] PROGRESS NOTE    Progress Note  [name]  KZS:[id number] DOB: 05-27-1940 DOA: 04/12/2020 PCP: Gracelyn Nurse, MD     Brief Narrative:   [name] is an 80 y.o. female past medical history significant for essential hypertension, chronic diastolic heart failure, non-insulin-dependent diabetes mellitus type 2, atrial fibrillation on Eliquis comes into the emergency room for acute encephalopathy, shortness of breath and fall. Significant studies: 04/12/2020 CT of the head showed no acute abnormalities mild to moderate sinus disease, mild cerebral atrophy. 04/12/2020 chest x-ray that showed mild pulmonary vascular congestion  Antibiotics: None  Microbiology data: Blood culture:  Procedures: None  Assessment/Plan:   Weakness and shortness of breath due to acute diastolic heart failure: Chest x-ray shows mild pulmonary edema, with a BNP greater than 100. Chart her estimated dry weight is around 107 kg 110 kg on admission she was 115 kg Started on BiPAP in the ED with improvement in her saturations, now transition to 2 L of oxygen which is what she was at home. She has been noncompliant with her Lasix at home. She was started on IV Lasix,  I's and O's are poorly recorded. She appears euvolemic on physical exam this morning. We will change back to her home dose of Lasix tomorrow morning, also her urine appears to be significantly concentrated will allow a diet.  Questionable confusion: On reevaluation by the admitting physician she appeared to be back to baseline. Ammonia level is pending  Obstructive sleep apnea not using CPAP at home: Started on CPAP at night.  Chronic respiratory failure with hypoxia due to COPD: Letter home dose of 2 L.  Essential hypertension: Her Coreg ACE and inhibitor, hold Lasix for today. Can be resumed tomorrow, check a basic metabolic panel  Debilitated and weakness: Physical therapy occupational therapy and TOC has  been consulted as she might need placement she seems extremely debilitated.  Question if she can perform her ADLs at home at this point.  A. fib on Eliquis: Resume Eliquis.  Non-insulin diabetes mellitus type 2: Hold Metformin continue Actos will start on sliding scale insulin.  Obesity, Class III, BMI 40-49.9 (morbid obesity) (HCC) Noted counseled.    DVT prophylaxis: Eliquis Family Communication:none Status is: Observation  The patient remains OBS appropriate and will d/c before 2 midnights.  Dispo: The patient is from: Home              Anticipated d/c is to: SNF              Patient currently is not medically stable to d/c.   Difficult to place patient No  Code Status:     Code Status Orders  (From admission, onward)         Start     Ordered   04/12/20 1731  Do not attempt resuscitation (DNR)  Continuous       Question Answer Comment  In the event of cardiac or respiratory ARREST Do not call a "code blue"   In the event of cardiac or respiratory ARREST Do not perform Intubation, CPR, defibrillation or ACLS   In the event of cardiac or respiratory ARREST Use medication by any route, position, wound care, and other measures to relive pain and suffering. May use oxygen, suction and manual treatment of airway obstruction as needed for comfort.      04/12/20 1731        Code Status History    Date Active Date Inactive Code Status Order  ID Comments User Context   07/29/2019 1821 08/01/2019 2126 Full Code [id number]  Dorcas CarrowGhimire, Kuber, MD Inpatient   04/09/2011 1154 04/10/2011 1504 Full Code [id number]  Allred, Truett MainlandAshley Gee, RN Inpatient   Advance Care Planning Activity    Advance Directive Documentation   Flowsheet Row Most Recent Value  Type of Advance Directive Healthcare Power of Attorney, Living will  Pre-existing out of facility DNR order (yellow form or pink MOST form) --  "MOST" Form in Place? --        IV Access:    Peripheral IV   Procedures and diagnostic  studies:   DG Chest 2 View  Result Date: 04/12/2020 CLINICAL DATA:  Weakness EXAM: CHEST - 2 VIEW COMPARISON:  None. FINDINGS: The heart size and mediastinal contours are mildly enlarged. Aortic knob calcifications are seen. There is prominence of the central pulmonary vasculature. Calcified granuloma is noted within the right lung base. A left-sided pacemaker seen with the lead tips in the right atrium right ventricle. The visualized skeletal structures are unremarkable. Cervical fixation is noted. IMPRESSION: Mild pulmonary vascular congestion. Electronically Signed   By: Jonna ClarkBindu  Avutu M.D.   On: 04/12/2020 16:34   DG Wrist Complete Right  Result Date: 04/12/2020 CLINICAL DATA:  Right wrist pain after fall. EXAM: RIGHT WRIST - COMPLETE 3+ VIEW COMPARISON:  None. FINDINGS: There is no evidence of fracture or dislocation. There is no evidence of arthropathy or other focal bone abnormality. Osteopenia. Soft tissues are unremarkable. IMPRESSION: Negative. Electronically Signed   By: Obie DredgeWilliam T Derry M.D.   On: 04/12/2020 14:02   CT Head Wo Contrast  Result Date: 04/12/2020 CLINICAL DATA:  Status post fall. EXAM: CT HEAD WITHOUT CONTRAST TECHNIQUE: Contiguous axial images were obtained from the base of the skull through the vertex without intravenous contrast. COMPARISON:  October 25, 2019 FINDINGS: Brain: There is mild cerebral atrophy with widening of the extra-axial spaces and ventricular dilatation. There are areas of decreased attenuation within the white matter tracts of the supratentorial brain, consistent with microvascular disease changes. Vascular: No hyperdense vessel or unexpected calcification. Skull: Normal. Negative for fracture or focal lesion. Sinuses/Orbits: There is mild sphenoid sinus and mild left maxillary sinus mucosal thickening. Moderate severity bilateral ethmoid sinus mucosal thickening is seen. Other: None. IMPRESSION: 1. Mild cerebral atrophy. 2. No acute intracranial  abnormality. 3. Mild to moderate severity paranasal sinus disease. Electronically Signed   By: Aram Candelahaddeus  Houston M.D.   On: 04/12/2020 16:04     Medical Consultants:    None.   Subjective:    [name] she relates she is very weak cannot perform her ADLs at home.  Objective:    Vitals:   04/12/20 2053 04/13/20 0202 04/13/20 0508 04/13/20 0756  BP: 101/73 119/68 (!) 144/62 (!) 151/59  Pulse: 72 70 70 69  Resp: 17 20 17 15   Temp: (!) 97.4 F (36.3 C) 98 F (36.7 C) (!) 97.3 F (36.3 C) (!) 97.5 F (36.4 C)  TempSrc: Oral Oral Oral Oral  SpO2: 100% 98% 96% 94%  Weight:      Height:       SpO2: 94 % O2 Flow Rate (L/min): 2 L/min   Intake/Output Summary (Last 24 hours) at 04/13/2020 0808 Last data filed at 04/13/2020 0440 Gross per 24 hour  Intake 736 ml  Output 600 ml  Net 136 ml   Filed Weights   04/12/20 1310 04/12/20 2000  Weight: 115.7 kg 115.6 kg    Exam: General exam: In  no acute distress. Respiratory system: Good air movement and clear to auscultation. Cardiovascular system: S1 & S2 heard, RRR. No JVD. Gastrointestinal system: Abdomen is nondistended, soft and nontender.  Central nervous system: Alert and oriented. No focal neurological deficits. Extremities: No pedal edema. Skin: No rashes, lesions or ulcers Psychiatry: Judgement and insight appear normal. Mood & affect appropriate.    Data Reviewed:    Labs: Basic Metabolic Panel: Recent Labs  Lab 04/12/20 1312 04/13/20 0426  NA 143 143  K 3.4* 3.6  CL 98 98  CO2 35* 37*  GLUCOSE 155* 167*  BUN 25* 18  CREATININE 1.07* 0.66  CALCIUM 8.9 8.7*  MG  --  1.6*   GFR Estimated Creatinine Clearance: 72.2 mL/min (by C-G formula based on SCr of 0.66 mg/dL). Liver Function Tests: Recent Labs  Lab 04/12/20 1312  AST 13*  ALT 9  ALKPHOS 55  BILITOT 0.6  PROT 6.8  ALBUMIN 3.0*   No results for input(s): LIPASE, AMYLASE in the last 168 hours. Recent Labs  Lab 04/12/20 1745   AMMONIA 18   Coagulation profile No results for input(s): INR, PROTIME in the last 168 hours. COVID-19 Labs  No results for input(s): DDIMER, FERRITIN, LDH, CRP in the last 72 hours.  Lab Results  Component Value Date   SARSCOV2NAA NEGATIVE 04/12/2020   SARSCOV2NAA NEGATIVE 10/25/2019   SARSCOV2NAA NEGATIVE 07/29/2019   SARSCOV2NAA NEGATIVE 01/14/2019    CBC: Recent Labs  Lab 04/12/20 1312 04/13/20 0426  WBC 11.5* 9.9  HGB 9.9* 9.7*  HCT 34.1* 32.0*  MCV 99.1 97.3  PLT 194 195   Cardiac Enzymes: No results for input(s): CKTOTAL, CKMB, CKMBINDEX, TROPONINI in the last 168 hours. BNP (last 3 results) No results for input(s): PROBNP in the last 8760 hours. CBG: Recent Labs  Lab 04/12/20 2051 04/13/20 0758  GLUCAP 109* 157*   D-Dimer: No results for input(s): DDIMER in the last 72 hours. Hgb A1c: Recent Labs    04/12/20 1745  HGBA1C 5.8*   Lipid Profile: No results for input(s): CHOL, HDL, LDLCALC, TRIG, CHOLHDL, LDLDIRECT in the last 72 hours. Thyroid function studies: No results for input(s): TSH, T4TOTAL, T3FREE, THYROIDAB in the last 72 hours.  Invalid input(s): FREET3 Anemia work up: No results for input(s): VITAMINB12, FOLATE, FERRITIN, TIBC, IRON, RETICCTPCT in the last 72 hours. Sepsis Labs: Recent Labs  Lab 04/12/20 1312 04/12/20 1507 04/12/20 1745 04/13/20 0426  PROCALCITON  --   --  <0.10  --   WBC 11.5*  --   --  9.9  LATICACIDVEN  --  0.9 0.8  --    Microbiology Recent Results (from the past 240 hour(s))  SARS CORONAVIRUS 2 (TAT 6-24 HRS) Nasopharyngeal Nasopharyngeal Swab     Status: None   Collection Time: 04/12/20  5:30 PM   Specimen: Nasopharyngeal Swab  Result Value Ref Range Status   SARS Coronavirus 2 NEGATIVE NEGATIVE Final    Comment: (NOTE) SARS-CoV-2 target nucleic acids are NOT DETECTED.  The SARS-CoV-2 RNA is generally detectable in upper and lower respiratory specimens during the acute phase of infection.  Negative results do not preclude SARS-CoV-2 infection, do not rule out co-infections with other pathogens, and should not be used as the sole basis for treatment or other patient management decisions. Negative results must be combined with clinical observations, patient history, and epidemiological information. The expected result is Negative.  Fact Sheet for Patients: HairSlick.no  Fact Sheet for Healthcare Providers: quierodirigir.com  This test is not yet  approved or cleared by the [place] and  has been authorized for detection and/or diagnosis of SARS-CoV-2 by FDA under an Emergency Use Authorization (EUA). This EUA will remain  in effect (meaning this test can be used) for the duration of the COVID-19 declaration under Se ction 564(b)(1) of the Act, 21 U.S.C. section 360bbb-3(b)(1), unless the authorization is terminated or revoked sooner.  Performed at [facility], [address], [place], [place] [address]      Medications:   . apixaban  5 mg Oral BID  . carvedilol  12.5 mg Oral BID WC  . furosemide  60 mg Intravenous Once  . insulin aspart  0-15 Units Subcutaneous TID WC  . insulin aspart  0-5 Units Subcutaneous QHS  . metFORMIN  500 mg Oral BID WC  . multivitamin with minerals  1 tablet Oral Daily  . pantoprazole  40 mg Oral Daily  . quinapril  40 mg Oral Daily  . rosuvastatin  10 mg Oral QHS  . sertraline  100 mg Oral Daily  . vitamin B-12  1,000 mcg Oral Daily   Continuous Infusions:    LOS: 0 days   Marinda Elk  [facility]  04/13/2020, 8:08 AM

## 2020-04-13 NOTE — NC FL2 (Signed)
[place] MEDICAID FL2 LEVEL OF CARE SCREENING TOOL     IDENTIFICATION  Patient Name: [name] Birthdate: [birth year] Sex: female Admission Date (Current Location): 04/12/2020  [place] and [place] Number:  Chiropodist and Address:  [facility], [address] [place], [place] [address]      Provider Number: [id number]  Attending Physician Name and Address:  Marinda Elk, MD  Relative Name and Phone Number:  Chealsea Paske (son) [phone]    Current Level of Care: Hospital Recommended Level of Care: Skilled Nursing Facility Prior Approval Number:    Date Approved/Denied:   PASRR Number: [id number]  Discharge Plan: SNF    Current Diagnoses: Patient Active Problem List   Diagnosis Date Noted  . Weakness 04/12/2020  . Obesity, Class III, BMI 40-49.9 (morbid obesity) (HCC) 04/12/2020  . Obesity, diabetes, and hypertension syndrome (HCC) 04/12/2020  . Metabolic syndrome 04/12/2020  . Shortness of breath 04/12/2020  . Overdose of coumadin, accidental or unintentional, initial encounter 07/29/2019  . 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 Piriformis syndrome 06/18/2014  . Facet syndrome, lumbar 06/18/2014  . 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 DDD (degenerative disc disease), lumbar 06/18/2014  . DDD (degenerative disc disease), cervical 06/08/2014  . 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 Cervical spinal stenosis 04/09/2011    Orientation RESPIRATION BLADDER Height & Weight     Self,Time,Situation,Place  O2 ([place] 2L) Incontinent,External catheter Weight: 115.6 kg Height:  5' 2.99" (160 cm)  BEHAVIORAL SYMPTOMS/MOOD NEUROLOGICAL BOWEL NUTRITION STATUS      Incontinent Diet  AMBULATORY STATUS COMMUNICATION OF NEEDS Skin   Extensive Assist Verbally Normal                       Personal Care Assistance Level of Assistance  Bathing,Feeding,Dressing Bathing Assistance:  Maximum assistance Feeding assistance: Maximum assistance Dressing Assistance: Maximum assistance     Functional Limitations Info             SPECIAL CARE FACTORS FREQUENCY  PT (By licensed PT),OT (By licensed OT)     PT Frequency: 5 times per week OT Frequency: 5 times per week            Contractures Contractures Info: Not present    Additional Factors Info  Code Status,Allergies Code Status Info: DNR Allergies Info: Cipro, dopamine, sulfa, sulfacetamide, sodium, tegaderm Ag mesh           Current Medications (04/13/2020):  This is the current hospital active medication list Current Facility-Administered Medications  Medication Dose Route Frequency Provider Last Rate Last Admin  . acetaminophen (TYLENOL) tablet 325 mg  325 mg Oral Q6H PRN Cox, Amy N, DO   325 mg at 04/13/20 1131   Or  . acetaminophen (TYLENOL) suppository 325 mg  325 mg Rectal Q6H PRN Cox, Amy N, DO      . albuterol (VENTOLIN HFA) 108 (90 Base) MCG/ACT inhaler 2 puff  2 puff Inhalation Q4H PRN Marinda Elk, MD      . apixaban Everlene Balls) tablet 5 mg  5 mg Oral BID Cox, Amy N, DO   5 mg at 04/13/20 0839  . carvedilol (COREG) tablet 12.5 mg  12.5 mg Oral BID WC Cox, Amy N, DO   12.5 mg at 04/13/20 0839  . diazepam (VALIUM) tablet 2 mg  2 mg Oral BID PRN Cox, Amy N, DO      . furosemide (LASIX) injection  60 mg  60 mg Intravenous Once Cox, Amy N, DO      . gabapentin (NEURONTIN) capsule 200 mg  200 mg Oral TID Marinda Elk, MD   200 mg at 04/13/20 0839  . insulin aspart (novoLOG) injection 0-15 Units  0-15 Units Subcutaneous TID WC Cox, Amy N, DO   2 Units at 04/13/20 0837  . insulin aspart (novoLOG) injection 0-5 Units  0-5 Units Subcutaneous QHS Cox, Amy N, DO      . multivitamin with minerals tablet 1 tablet  1 tablet Oral Daily Cox, Amy N, DO   1 tablet at 04/13/20 1000  . ondansetron (ZOFRAN) tablet 4 mg  4 mg Oral Q6H PRN Cox, Amy N, DO       Or  . ondansetron (ZOFRAN) injection 4 mg   4 mg Intravenous Q6H PRN Cox, Amy N, DO      . pantoprazole (PROTONIX) EC tablet 40 mg  40 mg Oral Daily Cox, Amy N, DO   40 mg at 04/13/20 0839  . quinapril (ACCUPRIL) tablet 40 mg  40 mg Oral Daily Cox, Amy N, DO   40 mg at 04/13/20 1227  . rosuvastatin (CRESTOR) tablet 10 mg  10 mg Oral QHS Cox, Amy N, DO   10 mg at 04/12/20 2112  . sertraline (ZOLOFT) tablet 100 mg  100 mg Oral Daily Cox, Amy N, DO   100 mg at 04/13/20 0841  . vitamin B-12 (CYANOCOBALAMIN) tablet 1,000 mcg  1,000 mcg Oral Daily Cox, Amy N, DO   1,000 mcg at 04/13/20 0840     Discharge Medications: Please see discharge summary for a list of discharge medications.  Relevant Imaging Results:  Relevant Lab Results:   Additional Information SS# [id number]   has had both Covid vaccines and the booster  Allayne Butcher, RN

## 2020-04-13 NOTE — TOC Initial Note (Signed)
Transition of Care [facility]) - Initial/Assessment Note    Patient Details  Name: [name] MRN: [id number] Date of Birth: 05/14/1940  Transition of Care [facility]) CM/SW Contact:    Allayne Butcher, RN Phone Number: 04/13/2020, 3:59 PM  Clinical Narrative:                 Patient placed in observation for weakness, encephalopathy, and acute respiratory failure.  RNCM was able to speak with patient's friend [name], who is here visiting with her.  [name] reports that patient is very lethargic this afternoon.  Patient lives at home alone but [name] voices concerns that the patient really needs long term care.  PT and OT are recommending SNF for short term rehab.  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   Referral faxed to [facility] at [phone], admission coordinator is Morley Kos [phone].  Joni Reining reports that they may have some openings next week.  Referral also sent to [facility] in [place].    [name] will update the patient's son who lives in [place] and her sisters who are in [place].    Expected Discharge Plan: Skilled Nursing Facility Barriers to Discharge: Continued Medical Work up   Patient Goals and CMS Choice Patient states their goals for this hospitalization and ongoing recovery are:: Patient's good friend would like for her to go for rehab and then possibly LTC CMS Medicare.gov Compare Post Acute Care list provided to:: Patient Represenative (must comment) Choice offered to / list presented to : Patient (friend [name])  Expected Discharge Plan and Services Expected Discharge Plan: Skilled Nursing Facility   Discharge Planning Services: CM Consult Post Acute Care Choice: Skilled Nursing Facility Living arrangements for the past 2 months: Apartment                 DME Arranged: N/A DME Agency: NA       HH Arranged: NA          Prior Living  Arrangements/Services Living arrangements for the past 2 months: Apartment Lives with:: Self Patient language and need for interpreter reviewed:: Yes Do you feel safe going back to the place where you live?: Yes      Need for Family Participation in Patient Care: Yes (Comment) (weakness) Care giver support system in place?: Yes (comment) (friend and son) Current home services: DME (walker, hospital bed, oxygen) Criminal Activity/Legal Involvement Pertinent to Current Situation/Hospitalization: No - Comment as needed  Activities of Daily Living Home Assistive Devices/Equipment: Environmental consultant (specify type) ADL Screening (condition at time of admission) Patient's cognitive ability adequate to safely complete daily activities?: Yes Is the patient deaf or have difficulty hearing?: No Does the patient have difficulty seeing, even when wearing glasses/contacts?: No Does the patient have difficulty concentrating, remembering, or making decisions?: No Patient able to express need for assistance with ADLs?: Yes Does the patient have difficulty dressing or bathing?: Yes Independently performs ADLs?: No Communication: Independent Dressing (OT): Needs assistance Grooming: Needs assistance Feeding: Independent Bathing: Needs assistance Toileting: Needs assistance In/Out Bed: Needs assistance Walks in Home: Independent with device (comment) Does the patient have difficulty walking or climbing stairs?: Yes Weakness of Legs: Both Weakness of Arms/Hands: None  Permission Sought/Granted Permission sought to share information with : Case Manager,Family Supports,Other (comment),Facility Medical sales representative Permission granted to share information with : Yes, Verbal Permission Granted  Share Information  with NAME: Laurell Josephs (friend)  Permission granted to share info w AGENCY: Armed forces operational officer and [employer]  Permission granted to share info w Relationship: friend     Emotional Assessment        Orientation: : Oriented to Self,Oriented to Place,Oriented to  Time,Oriented to Situation Alcohol / Substance Use: Not Applicable Psych Involvement: No (comment)  Admission diagnosis:  Hypercapnia [R06.89] Weakness [R53.1] Encephalopathy [G93.40] Generalized weakness [R53.1] Acute on chronic respiratory failure with hypercapnia (HCC) [J96.22] Patient Active Problem List   Diagnosis Date Noted  . Weakness 04/12/2020  . Obesity, Class III, BMI 40-49.9 (morbid obesity) (HCC) 04/12/2020  . Obesity, diabetes, and hypertension syndrome (HCC) 04/12/2020  . Metabolic syndrome 04/12/2020  . Shortness of breath 04/12/2020  . Overdose of coumadin, accidental or unintentional, initial encounter 07/29/2019  . 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 Piriformis syndrome 06/18/2014  . Facet syndrome, lumbar 06/18/2014  . 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 DDD (degenerative disc disease), lumbar 06/18/2014  . DDD (degenerative disc disease), cervical 06/08/2014  . 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 Cervical spinal stenosis 04/09/2011   PCP:  Gracelyn Nurse, MD Pharmacy:   [facility] [place], [place] - [address] [place] [address] Phone: [phone] Fax: [phone]     Social Determinants of Health (SDOH) Interventions    Readmission Risk Interventions No flowsheet data found.

## 2020-04-13 NOTE — Progress Notes (Addendum)
Cross Cover Nurse called to report patient with lethargy.  CO2 on bmp worsening vbg with CO2 again at 81 Transfer patient to 2A and restart bipap therapy.  Recheck vbg in am Of note, patient states she does have sleep apnea and is non compliant with CPAP  Repeat VBG not significantly improve Increase pressure support to 16/8 at 32% Fi02 and repeat vbg at noon

## 2020-04-13 NOTE — Care Management Obs Status (Signed)
MEDICARE OBSERVATION STATUS NOTIFICATION   Patient Details  Name: [name] MRN: [id number] Date of Birth: [birth year]   Medicare Observation Status Notification Given:  Yes    Allayne Butcher, RN 04/13/2020, 1:34 PM

## 2020-04-13 NOTE — Progress Notes (Signed)
   04/13/20 1921  Assess: MEWS Score  Temp 98 F (36.7 C)  BP (!) 98/37  Pulse Rate 70  ECG Heart Rate 70  Resp 14  Level of Consciousness Responds to Voice  SpO2 100 %  O2 Device Nasal Cannula  O2 Flow Rate (L/min) 2 L/min  Assess: MEWS Score  MEWS Temp 0  MEWS Systolic 1  MEWS Pulse 0  MEWS RR 0  MEWS LOC 1  MEWS Score 2  MEWS Score Color Yellow  Assess: if the MEWS score is Yellow or Red  Were vital signs taken at a resting state? Yes  Focused Assessment Change from prior assessment (see assessment flowsheet)  Early Detection of Sepsis Score *See Row Information* Low  MEWS guidelines implemented *See Row Information* Yes  Treat  Pain Scale 0-10  Pain Score 0  Take Vital Signs  Increase Vital Sign Frequency  Yellow: Q 2hr X 2 then Q 4hr X 2, if remains yellow, continue Q 4hrs  Escalate  MEWS: Escalate Yellow: discuss with charge nurse/RN and consider discussing with provider and RRT  Notify: Charge Nurse/RN  Name of Charge Nurse/RN Notified Trinna Post, RN  Date Charge Nurse/RN Notified 04/13/20  Time Charge Nurse/RN Notified 1923  Notify: Provider  Provider Name/Title B. Jon Billings NP  Date Provider Notified 04/13/20  Time Provider Notified 1924  Notification Type Page  Notification Reason Change in status  Provider response See new orders  Date of Provider Response 04/13/20  Time of Provider Response 1931   Pt increasingly lethargic during day per report. Awakens easily and mentation at baseline. Reports she gets tired like this at home. BP also trending lower during day, which pt also reports happens at home. NP notified and VBG ordered for CO2 as pt admitted with hypercarbia. Pt aware of current POC. Charge RN updated.

## 2020-04-13 NOTE — Evaluation (Signed)
Occupational Therapy Evaluation Patient Details Name: [name] MRN: [id number] DOB: 1940/04/30 Today's Date: 04/13/2020    History of Present Illness [name] is a 80 y.o. female with medical history significant for hypertension, morbid obesity, heart failure preserved ejection fraction (grade 3 diastolic dysfunction), hypertension, hyperlipidemia, non-insulin-dependent diabetes mellitus, metabolic syndrome, atrial fibrillation on Eliquis, presented to the emergency department from home for chief concerns of fall, confusion, shortness of breath.   Clinical Impression   [name] presents today with extreme fatigue, generalize weakness, 8/10 back pain, b/l LE edema, and flat affect. PTA pt lived alone, with assistance with shopping, cleaning, laundry, but performing her own ADL with Mod I. She uses a RW for ambulation, reporting that she never leaves her apartment. She takes only sponge baths and eats microwave-ready meals. She reports that she does not have grab bars in her bathroom at home, and that getting up and down off toilet is difficult for her. Recommend BSC. She has a RW and manual WC at home, with power WC on order. During today's evaluation, she requires Total A for bed mobility, Total A for LB dressing, is unable to raise either LE up from mattress, demonstrates ~ 100 degree ROM in UE, reports feeling SOB (O2 sats 97% on 3L O2). Oriented to time and place. Very flat affect, does not speak above a whisper. Reports no family or friends in nearby area. Recommend ongoing OT while hospitalized, SNF post d/c.    Follow Up Recommendations  SNF    Equipment Recommendations  3 in 1 bedside commode    Recommendations for Other Services       Precautions / Restrictions Precautions Precautions: Fall Restrictions Weight Bearing Restrictions: No      Mobility Bed Mobility Overal bed mobility: Needs Assistance Bed Mobility: Rolling;Supine to Sit;Sit to Supine Rolling: Max  assist   Supine to sit: Total assist Sit to supine: Total assist        Transfers Overall transfer level: Needs assistance               General transfer comment: did not attempt, pt unable to come into sitting    Balance Overall balance assessment: Needs assistance Sitting-balance support: Bilateral upper extremity supported Sitting balance-Leahy Scale: Poor       Standing balance-Leahy Scale: Zero                             ADL either performed or assessed with clinical judgement   ADL Overall ADL's : Needs assistance/impaired                     Lower Body Dressing: Total assistance                       Vision Baseline Vision/History: Wears glasses Wears Glasses: Reading only Patient Visual Report: No change from baseline       Perception     Praxis      Pertinent Vitals/Pain Pain Assessment: 0-10 Pain Score: 8  Pain Location: back Pain Intervention(s): Limited activity within patient's tolerance;Monitored during session     Hand Dominance     Extremity/Trunk Assessment Upper Extremity Assessment Upper Extremity Assessment: Generalized weakness   Lower Extremity Assessment Lower Extremity Assessment: Generalized weakness       Communication Communication Communication: No difficulties   Cognition Arousal/Alertness: Awake/alert Behavior During Therapy: WFL for tasks assessed/performed Overall Cognitive Status: Within  Functional Limits for tasks assessed                                     General Comments  +2 edema b/l LE    Exercises Other Exercises Other Exercises: Educ re: role of OT, importance of OOB activity   Shoulder Instructions      Home Living Family/patient expects to be discharged to:: Private residence Living Arrangements: Alone Available Help at Discharge: Available PRN/intermittently;Friend(s);Other (Comment) (No assistance in immediate area. She has a friend in [place]  and a son in [place].) Type of Home: Apartment Home Access: Level entry     Home Layout: One level     Bathroom Shower/Tub:  (Pt does not use -- takes only sponge baths from a chair)   Bathroom Toilet: Standard Bathroom Accessibility: No   Home Equipment: Walker - 2 wheels;Wheelchair - manual   Additional Comments: has a power chair on order, should be available in ~ 3 months      Prior Functioning/Environment Level of Independence: Needs assistance  Gait / Transfers Assistance Needed: uses walker, WC occassionally. ADL's / Homemaking Assistance Needed: hires someone to do shopping, cleaning, laundry            OT Problem List: Decreased strength;Decreased range of motion;Decreased activity tolerance;Impaired balance (sitting and/or standing);Increased edema;Obesity      OT Treatment/Interventions: Self-care/ADL training;Therapeutic exercise;Patient/family education;Balance training;Energy conservation;Therapeutic activities    OT Goals(Current goals can be found in the care plan section) Acute Rehab OT Goals Patient Stated Goal: to feel better OT Goal Formulation: With patient Time For Goal Achievement: 04/27/20 Potential to Achieve Goals: Good ADL Goals Pt Will Perform Grooming: sitting;with min guard assist Pt Will Perform Lower Body Dressing: with mod assist;sit to/from stand Pt Will Transfer to Toilet: with mod assist;stand pivot transfer (using LRAD)  OT Frequency: Min 1X/week   Barriers to D/C: Decreased caregiver support          Co-evaluation              AM-PAC OT "6 Clicks" Daily Activity     Outcome Measure Help from another person eating meals?: A Little Help from another person taking care of personal grooming?: A Little Help from another person toileting, which includes using toliet, bedpan, or urinal?: A Lot Help from another person bathing (including washing, rinsing, drying)?: A Lot Help from another person to put on and taking off  regular upper body clothing?: A Lot Help from another person to put on and taking off regular lower body clothing?: A Lot 6 Click Score: 14   End of Session Nurse Communication: Mobility status  Activity Tolerance: Patient limited by fatigue Patient left: in bed;with call bell/phone within reach;with bed alarm set  OT Visit Diagnosis: Unsteadiness on feet (R26.81);Muscle weakness (generalized) (M62.81)                Time: 6384-5364 OT Time Calculation (min): 22 min Charges:  OT General Charges $OT Visit: 1 Visit OT Evaluation $OT Eval Moderate Complexity: 1 Mod OT Treatments $Self Care/Home Management : 8-22 mins  Latina Craver, PhD, MS, OTR/L 04/13/20, 9:46 AM

## 2020-04-13 NOTE — Evaluation (Signed)
Physical Therapy Evaluation Patient Details Name: [name] MRN: [id number] DOB: September 13, 1940 Today's Date: 04/13/2020   History of Present Illness  [name] is a 80 y.o. female with medical history significant for hypertension, morbid obesity, heart failure preserved ejection fraction (grade 3 diastolic dysfunction), hypertension, hyperlipidemia, non-insulin-dependent diabetes mellitus, metabolic syndrome, atrial fibrillation on Eliquis, presented to the emergency department from home for chief concerns of fall, confusion, shortness of breath.  Clinical Impression  Pt seen for PT evaluation with pt lethargic during session with little active engagement without MAX cuing/encouragement. Pt requires TOTAL assist for supine<>sit and TOTAL assist for sitting EOB. Pt requires encouragement to perform BLE strengthening exercises from bed level & PT educates pt on use of acapella flutter valve. Pt is currently unsafe to d/c home alone & would benefit from STR upon d/c to maximize independence with functional mobility & reduce fall risk.   Pt on 2.5L/min via nasal cannula during session.    Follow Up Recommendations SNF    Equipment Recommendations   (TBD in next venue)    Recommendations for Other Services       Precautions / Restrictions Precautions Precautions: Fall Restrictions Weight Bearing Restrictions: No      Mobility  Bed Mobility Overal bed mobility: Needs Assistance Bed Mobility: Supine to Sit;Sit to Supine Rolling: Max assist   Supine to sit: Total assist;HOB elevated Sit to supine: Total assist;HOB elevated   General bed mobility comments: pt initially attempts to transfer BLE to EOB but then no active effort with movement supine<>sit    Transfers Overall transfer level: Needs assistance               General transfer comment: did not attempt, pt unable to come into sitting  Ambulation/Gait                Stairs            Wheelchair  Mobility    Modified Rankin (Stroke Patients Only)       Balance Overall balance assessment: Needs assistance Sitting-balance support: Bilateral upper extremity supported Sitting balance-Leahy Scale: Zero Sitting balance - Comments: total assist static sitting EOB Postural control: Posterior lean   Standing balance-Leahy Scale: Zero                               Pertinent Vitals/Pain Pain Assessment: Faces Pain Score: 8  Pain Location: R wrist Pain Descriptors / Indicators: Grimacing Pain Intervention(s): Patient requesting pain meds-RN notified;Monitored during session    Home Living Family/patient expects to be discharged to:: Private residence Living Arrangements: Alone Available Help at Discharge: Available PRN/intermittently;Friend(s);Other (Comment) Type of Home: Apartment Home Access: Level entry     Home Layout: One level Home Equipment: Walker - 2 wheels;Wheelchair - manual Additional Comments: has a power chair on order, should be available in ~ 3 months    Prior Function Level of Independence: Needs assistance   Gait / Transfers Assistance Needed: uses walker, WC occassionally.  ADL's / Homemaking Assistance Needed: hires someone to do shopping, cleaning, laundry        Hand Dominance        Extremity/Trunk Assessment   Upper Extremity Assessment Upper Extremity Assessment: RUE deficits/detail;LUE deficits/detail RUE Deficits / Details: very minimal (~10 degrees) R shoulder flexion 2/2 pain & weakness LUE Deficits / Details: ~120 degrees shoulder flexion    Lower Extremity Assessment Lower Extremity Assessment: Generalized weakness  Cervical / Trunk Assessment Cervical / Trunk Assessment: Kyphotic  Communication   Communication: No difficulties  Cognition Arousal/Alertness: Lethargic Behavior During Therapy: WFL for tasks assessed/performed Overall Cognitive Status: Difficult to assess                                  General Comments: Very lethargic throughout session, frequent cuing to open eyes      General Comments General comments (skin integrity, edema, etc.): educated pt on use of acapella flutter valve with pt return demonstrating 4x    Exercises General Exercises - Lower Extremity Ankle Circles/Pumps: AROM;Right;Left;10 reps;Supine;Strengthening Short Arc Quad: AROM;Strengthening;Right;Left;10 reps;Supine Heel Slides: AAROM;Strengthening;Right;Left;10 reps;Supine    Assessment/Plan    PT Assessment Patient needs continued PT services  PT Problem List Decreased strength;Decreased mobility;Obesity;Decreased activity tolerance;Pain;Decreased knowledge of use of DME;Decreased balance       PT Treatment Interventions DME instruction;Therapeutic activities;Gait training;Therapeutic exercise;Patient/family education;Balance training;Stair training;Functional mobility training;Neuromuscular re-education;Manual techniques;Wheelchair mobility training    PT Goals (Current goals can be found in the Care Plan section)  Acute Rehab PT Goals Patient Stated Goal: to feel better PT Goal Formulation: With patient Time For Goal Achievement: 04/27/20 Potential to Achieve Goals: Fair    Frequency Min 2X/week   Barriers to discharge Decreased caregiver support      Co-evaluation               AM-PAC PT "6 Clicks" Mobility  Outcome Measure Help needed turning from your back to your side while in a flat bed without using bedrails?: Total Help needed moving from lying on your back to sitting on the side of a flat bed without using bedrails?: Total Help needed moving to and from a bed to a chair (including a wheelchair)?: Total Help needed standing up from a chair using your arms (e.g., wheelchair or bedside chair)?: Total Help needed to walk in hospital room?: Total Help needed climbing 3-5 steps with a railing? : Total 6 Click Score: 6    End of Session   Activity Tolerance:  Patient limited by fatigue;Patient limited by lethargy Patient left: in bed;with bed alarm set;with call bell/phone within reach Nurse Communication:  (coughing after taking medication) PT Visit Diagnosis: Difficulty in walking, not elsewhere classified (R26.2);Muscle weakness (generalized) (M62.81);Pain Pain - Right/Left: Right Pain - part of body:  (wrist)    Time: 0174-9449 PT Time Calculation (min) (ACUTE ONLY): 25 min   Charges:   PT Evaluation $PT Eval Low Complexity: 1 Low PT Treatments $Therapeutic Exercise: 8-22 mins        Aleda Grana, PT, DPT 04/13/20, 1:13 PM   Sandi Mariscal 04/13/2020, 1:10 PM

## 2020-04-13 NOTE — Plan of Care (Addendum)
Pt admitted from ED overnight in no acute distress. AOx3-4, baseline 2L [place], VSS, v-paced on telemetry. C/o chronic back pain overnight, did not want interventions. Purewick placed, adequate UO, no BM this shift and c-diff specimen unable to be collected. UA collected and sent to lab. COVID swab negative, pt informed and precautions removed. Q2hr turns maintained, fall/safety precautions in place, rounding performed. Pt updated son via personal cell phone.   Problem: Education: Goal: Knowledge of General Education information will improve Description: Including pain rating scale, medication(s)/side effects and non-pharmacologic comfort measures Outcome: Progressing   Problem: Health Behavior/Discharge Planning: Goal: Ability to manage health-related needs will improve Outcome: Progressing   Problem: Clinical Measurements: Goal: Ability to maintain clinical measurements within normal limits will improve Outcome: Progressing Goal: Will remain free from infection Outcome: Progressing Goal: Diagnostic test results will improve Outcome: Progressing Goal: Respiratory complications will improve Outcome: Progressing Goal: Cardiovascular complication will be avoided Outcome: Progressing   Problem: Activity: Goal: Risk for activity intolerance will decrease Outcome: Progressing   Problem: Nutrition: Goal: Adequate nutrition will be maintained Outcome: Progressing   Problem: Coping: Goal: Level of anxiety will decrease Outcome: Progressing   Problem: Elimination: Goal: Will not experience complications related to bowel motility Outcome: Progressing Goal: Will not experience complications related to urinary retention Outcome: Progressing   Problem: Pain Managment: Goal: General experience of comfort will improve Outcome: Progressing   Problem: Safety: Goal: Ability to remain free from injury will improve Outcome: Progressing   Problem: Skin Integrity: Goal: Risk for impaired skin  integrity will decrease Outcome: Progressing

## 2020-04-14 DIAGNOSIS — I482 Chronic atrial fibrillation, unspecified: Secondary | ICD-10-CM | POA: Diagnosis present

## 2020-04-14 DIAGNOSIS — I5033 Acute on chronic diastolic (congestive) heart failure: Secondary | ICD-10-CM | POA: Diagnosis present

## 2020-04-14 DIAGNOSIS — G9349 Other encephalopathy: Secondary | ICD-10-CM | POA: Diagnosis present

## 2020-04-14 DIAGNOSIS — J449 Chronic obstructive pulmonary disease, unspecified: Secondary | ICD-10-CM | POA: Diagnosis present

## 2020-04-14 DIAGNOSIS — K219 Gastro-esophageal reflux disease without esophagitis: Secondary | ICD-10-CM | POA: Diagnosis present

## 2020-04-14 DIAGNOSIS — Z7901 Long term (current) use of anticoagulants: Secondary | ICD-10-CM | POA: Diagnosis not present

## 2020-04-14 DIAGNOSIS — Z6841 Body Mass Index (BMI) 40.0 and over, adult: Secondary | ICD-10-CM | POA: Diagnosis not present

## 2020-04-14 DIAGNOSIS — E662 Morbid (severe) obesity with alveolar hypoventilation: Secondary | ICD-10-CM | POA: Diagnosis present

## 2020-04-14 DIAGNOSIS — Z79899 Other long term (current) drug therapy: Secondary | ICD-10-CM | POA: Diagnosis not present

## 2020-04-14 DIAGNOSIS — R531 Weakness: Secondary | ICD-10-CM | POA: Diagnosis not present

## 2020-04-14 DIAGNOSIS — R0689 Other abnormalities of breathing: Secondary | ICD-10-CM

## 2020-04-14 DIAGNOSIS — J9622 Acute and chronic respiratory failure with hypercapnia: Secondary | ICD-10-CM | POA: Diagnosis present

## 2020-04-14 DIAGNOSIS — Z9981 Dependence on supplemental oxygen: Secondary | ICD-10-CM | POA: Diagnosis not present

## 2020-04-14 DIAGNOSIS — Z8673 Personal history of transient ischemic attack (TIA), and cerebral infarction without residual deficits: Secondary | ICD-10-CM | POA: Diagnosis not present

## 2020-04-14 DIAGNOSIS — M5030 Other cervical disc degeneration, unspecified cervical region: Secondary | ICD-10-CM | POA: Diagnosis present

## 2020-04-14 DIAGNOSIS — G9340 Encephalopathy, unspecified: Secondary | ICD-10-CM | POA: Diagnosis not present

## 2020-04-14 DIAGNOSIS — F32A Depression, unspecified: Secondary | ICD-10-CM | POA: Diagnosis present

## 2020-04-14 DIAGNOSIS — E8881 Metabolic syndrome: Secondary | ICD-10-CM | POA: Diagnosis present

## 2020-04-14 DIAGNOSIS — I110 Hypertensive heart disease with heart failure: Secondary | ICD-10-CM | POA: Diagnosis present

## 2020-04-14 DIAGNOSIS — E785 Hyperlipidemia, unspecified: Secondary | ICD-10-CM | POA: Diagnosis present

## 2020-04-14 DIAGNOSIS — Z87891 Personal history of nicotine dependence: Secondary | ICD-10-CM | POA: Diagnosis not present

## 2020-04-14 DIAGNOSIS — E871 Hypo-osmolality and hyponatremia: Secondary | ICD-10-CM | POA: Diagnosis present

## 2020-04-14 DIAGNOSIS — Z66 Do not resuscitate: Secondary | ICD-10-CM | POA: Diagnosis present

## 2020-04-14 DIAGNOSIS — F419 Anxiety disorder, unspecified: Secondary | ICD-10-CM | POA: Diagnosis present

## 2020-04-14 DIAGNOSIS — M109 Gout, unspecified: Secondary | ICD-10-CM | POA: Diagnosis present

## 2020-04-14 DIAGNOSIS — J9621 Acute and chronic respiratory failure with hypoxia: Secondary | ICD-10-CM | POA: Diagnosis present

## 2020-04-14 DIAGNOSIS — Z20822 Contact with and (suspected) exposure to covid-19: Secondary | ICD-10-CM | POA: Diagnosis present

## 2020-04-14 LAB — BASIC METABOLIC PANEL
Anion gap: 8 (ref 5–15)
BUN: 14 mg/dL (ref 8–23)
CO2: 38 mmol/L — ABNORMAL HIGH (ref 22–32)
Calcium: 8.8 mg/dL — ABNORMAL LOW (ref 8.9–10.3)
Chloride: 95 mmol/L — ABNORMAL LOW (ref 98–111)
Creatinine, Ser: 0.6 mg/dL (ref 0.44–1.00)
GFR, Estimated: >60 mL/min (ref >60)
Glucose, Bld: 145 mg/dL — ABNORMAL HIGH (ref 70–99)
Potassium: 3.4 mmol/L — ABNORMAL LOW (ref 3.5–5.1)
Sodium: 141 mmol/L (ref 135–145)

## 2020-04-14 LAB — BLOOD GAS, VENOUS
Acid-Base Excess: 20.2 mmol/L — ABNORMAL HIGH (ref 0.0–2.0)
Acid-Base Excess: 17.4 mmol/L — ABNORMAL HIGH (ref 0.0–2.0)
Bicarbonate: 48.4 mmol/L — ABNORMAL HIGH (ref 20.0–28.0)
Bicarbonate: 46.7 mmol/L — ABNORMAL HIGH (ref 20.0–28.0)
Delivery systems: POSITIVE
FIO2: 0.32
O2 Saturation: 83 %
O2 Saturation: 76.4 %
PEEP: 5 cmH2O
Patient temperature: 37
Patient temperature: 37
Pressure support: 9 cmH2O
pCO2, Ven: 80 mmHg (ref 44.0–60.0)
pCO2, Ven: 79 mmHg (ref 44.0–60.0)
pH, Ven: 7.39 (ref 7.250–7.430)
pH, Ven: 7.38 (ref 7.250–7.430)
pO2, Ven: 48 mmHg — ABNORMAL HIGH (ref 32.0–45.0)
pO2, Ven: 42 mmHg (ref 32.0–45.0)

## 2020-04-14 LAB — GLUCOSE, CAPILLARY
Glucose-Capillary: 134 mg/dL — ABNORMAL HIGH (ref 70–99)
Glucose-Capillary: 119 mg/dL — ABNORMAL HIGH (ref 70–99)
Glucose-Capillary: 137 mg/dL — ABNORMAL HIGH (ref 70–99)
Glucose-Capillary: 128 mg/dL — ABNORMAL HIGH (ref 70–99)

## 2020-04-14 MED ORDER — POTASSIUM CHLORIDE CRYS ER 20 MEQ PO TBCR
20.0000 meq | EXTENDED_RELEASE_TABLET | Freq: Two times a day (BID) | ORAL | Status: DC
  Administered 2020-04-14 – 2020-04-17 (×7): 20 meq via ORAL
  Filled 2020-04-14 (×7): qty 1

## 2020-04-14 MED ORDER — SODIUM CHLORIDE 0.9 % IV BOLUS
500.0000 mL | Freq: Once | INTRAVENOUS | Status: AC
  Administered 2020-04-14: 500 mL via INTRAVENOUS

## 2020-04-14 MED ORDER — SODIUM CHLORIDE 0.9 % IV SOLN: INTRAVENOUS | Status: AC

## 2020-04-14 NOTE — Progress Notes (Signed)
Changed from Bipap to Cpap mode

## 2020-04-14 NOTE — Progress Notes (Addendum)
[facility] PROGRESS NOTE    Progress Note  [name]  RCV:[id number] DOB: 10-12-1940 DOA: 04/12/2020 PCP: Gracelyn Nurse, MD     Brief Narrative:   [name] is an 80 y.o. female past medical history significant for essential hypertension, chronic diastolic heart failure, non-insulin-dependent diabetes mellitus type 2, atrial fibrillation on Eliquis comes into the emergency room for acute encephalopathy, shortness of breath and fall. Significant studies: 04/12/2020 CT of the head showed no acute abnormalities mild to moderate sinus disease, mild cerebral atrophy. 04/12/2020 chest x-ray that showed mild pulmonary vascular congestion  Antibiotics: None  Microbiology data: Blood culture:  Procedures: None  Assessment/Plan:   Weakness and shortness of breath due to acute diastolic heart failure: Chest x-ray shows mild pulmonary edema, with a BNP greater than 100. She has been noncompliant with her Lasix at home. I's and O's have been poorly recorded. She appears euvolemic on physical exam. Her bicarb is up she is becoming hyperchloremic and hyponatremic, will start on gentle IV fluid hydration recheck in the morning.  Acute encephalopathy probably due to obstructive sleep apnea as she is not using CPAP at home: She not been using her BiPAP at home or here. She was started on BiPAP she seems more alert this morning.  Chronic respiratory failure with hypoxia due to COPD: Uses home dose of 2 L.  Essential hypertension: Her Coreg ACE and inhibitor, continue to hold ACE inhibitor. Blood pressure stable creatinine at baseline, CO2 the med is trending up. Probably due to overdiuresis.  Debilitated and weakness: Physical therapy occupational therapy and TOC has been consulted as she might need placement she seems extremely debilitated.   Physical therapy evaluated the patient and the recommended skilled nursing facility. TOC has been consulted for possible  placement.  A. fib on Eliquis: Resume Eliquis, rate control  Non-insulin diabetes mellitus type 2: Hold Metformin continue Actos will start on sliding scale insulin.  Obesity, Class III, BMI 40-49.9 (morbid obesity) (HCC) Noted counseled.    DVT prophylaxis: Eliquis Family Communication:none Status is: Observation  The patient remains OBS appropriate and will d/c before 2 midnights.  Dispo: The patient is from: Home              Anticipated d/c is to: SNF              Patient currently is not medically stable to d/c.   Difficult to place patient No  Code Status:     Code Status Orders  (From admission, onward)         Start     Ordered   04/12/20 1731  Do not attempt resuscitation (DNR)  Continuous       Question Answer Comment  In the event of cardiac or respiratory ARREST Do not call a "code blue"   In the event of cardiac or respiratory ARREST Do not perform Intubation, CPR, defibrillation or ACLS   In the event of cardiac or respiratory ARREST Use medication by any route, position, wound care, and other measures to relive pain and suffering. May use oxygen, suction and manual treatment of airway obstruction as needed for comfort.      04/12/20 1731        Code Status History    Date Active Date Inactive Code Status Order ID Comments User Context   07/29/2019 1821 08/01/2019 2126 Full Code [id number]  Dorcas Carrow, MD Inpatient   04/09/2011 1154 04/10/2011 1504 Full Code [id number]  Allred, Truett Mainland, RN  Inpatient   Advance Care Planning Activity    Advance Directive Documentation   Flowsheet Row Most Recent Value  Type of Advance Directive Healthcare Power of Attorney, Living will  Pre-existing out of facility DNR order (yellow form or pink MOST form) --  "MOST" Form in Place? --        IV Access:    Peripheral IV   Procedures and diagnostic studies:   DG Chest 2 View  Result Date: 04/12/2020 CLINICAL DATA:  Weakness EXAM: CHEST - 2 VIEW COMPARISON:   None. FINDINGS: The heart size and mediastinal contours are mildly enlarged. Aortic knob calcifications are seen. There is prominence of the central pulmonary vasculature. Calcified granuloma is noted within the right lung base. A left-sided pacemaker seen with the lead tips in the right atrium right ventricle. The visualized skeletal structures are unremarkable. Cervical fixation is noted. IMPRESSION: Mild pulmonary vascular congestion. Electronically Signed   By: Jonna Clark M.D.   On: 04/12/2020 16:34   DG Wrist Complete Right  Result Date: 04/12/2020 CLINICAL DATA:  Right wrist pain after fall. EXAM: RIGHT WRIST - COMPLETE 3+ VIEW COMPARISON:  None. FINDINGS: There is no evidence of fracture or dislocation. There is no evidence of arthropathy or other focal bone abnormality. Osteopenia. Soft tissues are unremarkable. IMPRESSION: Negative. Electronically Signed   By: Obie Dredge M.D.   On: 04/12/2020 14:02   CT Head Wo Contrast  Result Date: 04/12/2020 CLINICAL DATA:  Status post fall. EXAM: CT HEAD WITHOUT CONTRAST TECHNIQUE: Contiguous axial images were obtained from the base of the skull through the vertex without intravenous contrast. COMPARISON:  October 25, 2019 FINDINGS: Brain: There is mild cerebral atrophy with widening of the extra-axial spaces and ventricular dilatation. There are areas of decreased attenuation within the white matter tracts of the supratentorial brain, consistent with microvascular disease changes. Vascular: No hyperdense vessel or unexpected calcification. Skull: Normal. Negative for fracture or focal lesion. Sinuses/Orbits: There is mild sphenoid sinus and mild left maxillary sinus mucosal thickening. Moderate severity bilateral ethmoid sinus mucosal thickening is seen. Other: None. IMPRESSION: 1. Mild cerebral atrophy. 2. No acute intracranial abnormality. 3. Mild to moderate severity paranasal sinus disease. Electronically Signed   By: Aram Candela M.D.   On:  04/12/2020 16:04     Medical Consultants:    None.   Subjective:    [name] on BiPAP more awake this morning.  Objective:    Vitals:   04/14/20 0119 04/14/20 0424 04/14/20 0730 04/14/20 0824  BP: (!) 121/59 127/61  124/77  Pulse: 70 70 72 70  Resp: 17 17 17 14   Temp: 98.5 F (36.9 C) 97.6 F (36.4 C)  98.6 F (37 C)  TempSrc: Oral Oral  Oral  SpO2: 99% 96% 93% 98%  Weight:  115.5 kg    Height:       SpO2: 98 % O2 Flow Rate (L/min): 2 L/min FiO2 (%): 32 %   Intake/Output Summary (Last 24 hours) at 04/14/2020 0905 Last data filed at 04/14/2020 0036 Gross per 24 hour  Intake 394.57 ml  Output 750 ml  Net -355.43 ml   Filed Weights   04/12/20 1310 04/12/20 2000 04/14/20 0424  Weight: 115.7 kg 115.6 kg 115.5 kg    Exam: General exam: In no acute distress. Respiratory system: Good air movement and clear to auscultation. Cardiovascular system: S1 & S2 heard, RRR. No JVD. Gastrointestinal system: Abdomen is nondistended, soft and nontender.  Extremities: No pedal edema. Skin: No  rashes, lesions or ulcers Psychiatry: Judgement and insight appear normal. Mood & affect appropriate.   Data Reviewed:    Labs: Basic Metabolic Panel: Recent Labs  Lab 04/12/20 1312 04/13/20 0426 04/14/20 0447  NA 143 143 141  K 3.4* 3.6 3.4*  CL 98 98 95*  CO2 35* 37* 38*  GLUCOSE 155* 167* 145*  BUN 25* 18 14  CREATININE 1.07* 0.66 0.60  CALCIUM 8.9 8.7* 8.8*  MG  --  1.6*  --    GFR Estimated Creatinine Clearance: 72.1 mL/min (by C-G formula based on SCr of 0.6 mg/dL). Liver Function Tests: Recent Labs  Lab 04/12/20 1312  AST 13*  ALT 9  ALKPHOS 55  BILITOT 0.6  PROT 6.8  ALBUMIN 3.0*   No results for input(s): LIPASE, AMYLASE in the last 168 hours. Recent Labs  Lab 04/12/20 1745  AMMONIA 18   Coagulation profile No results for input(s): INR, PROTIME in the last 168 hours. COVID-19 Labs  No results for input(s): DDIMER, FERRITIN, LDH, CRP in  the last 72 hours.  Lab Results  Component Value Date   SARSCOV2NAA NEGATIVE 04/12/2020   SARSCOV2NAA NEGATIVE 10/25/2019   SARSCOV2NAA NEGATIVE 07/29/2019   SARSCOV2NAA NEGATIVE 01/14/2019    CBC: Recent Labs  Lab 04/12/20 1312 04/13/20 0426  WBC 11.5* 9.9  HGB 9.9* 9.7*  HCT 34.1* 32.0*  MCV 99.1 97.3  PLT 194 195   Cardiac Enzymes: No results for input(s): CKTOTAL, CKMB, CKMBINDEX, TROPONINI in the last 168 hours. BNP (last 3 results) No results for input(s): PROBNP in the last 8760 hours. CBG: Recent Labs  Lab 04/13/20 0758 04/13/20 1152 04/13/20 1636 04/13/20 1934 04/14/20 0824  GLUCAP 157* 114* 144* 150* 134*   D-Dimer: No results for input(s): DDIMER in the last 72 hours. Hgb A1c: Recent Labs    04/12/20 1745  HGBA1C 5.8*   Lipid Profile: No results for input(s): CHOL, HDL, LDLCALC, TRIG, CHOLHDL, LDLDIRECT in the last 72 hours. Thyroid function studies: No results for input(s): TSH, T4TOTAL, T3FREE, THYROIDAB in the last 72 hours.  Invalid input(s): FREET3 Anemia work up: No results for input(s): VITAMINB12, FOLATE, FERRITIN, TIBC, IRON, RETICCTPCT in the last 72 hours. Sepsis Labs: Recent Labs  Lab 04/12/20 1312 04/12/20 1507 04/12/20 1745 04/13/20 0426  PROCALCITON  --   --  <0.10  --   WBC 11.5*  --   --  9.9  LATICACIDVEN  --  0.9 0.8  --    Microbiology Recent Results (from the past 240 hour(s))  SARS CORONAVIRUS 2 (TAT 6-24 HRS) Nasopharyngeal Nasopharyngeal Swab     Status: None   Collection Time: 04/12/20  5:30 PM   Specimen: Nasopharyngeal Swab  Result Value Ref Range Status   SARS Coronavirus 2 NEGATIVE NEGATIVE Final    Comment: (NOTE) SARS-CoV-2 target nucleic acids are NOT DETECTED.  The SARS-CoV-2 RNA is generally detectable in upper and lower respiratory specimens during the acute phase of infection. Negative results do not preclude SARS-CoV-2 infection, do not rule out co-infections with other pathogens, and should not  be used as the sole basis for treatment or other patient management decisions. Negative results must be combined with clinical observations, patient history, and epidemiological information. The expected result is Negative.  Fact Sheet for Patients: HairSlick.nohttps://www.fda.gov/media/138098/download  Fact Sheet for Healthcare Providers: quierodirigir.comhttps://www.fda.gov/media/138095/download  This test is not yet approved or cleared by the [place] FDA and  has been authorized for detection and/or diagnosis of SARS-CoV-2 by FDA under an Emergency Use  Authorization (EUA). This EUA will remain  in effect (meaning this test can be used) for the duration of the COVID-19 declaration under Se ction 564(b)(1) of the Act, 21 U.S.C. section 360bbb-3(b)(1), unless the authorization is terminated or revoked sooner.  Performed at [facility], [address] [place], [place] [address]      Medications:   . apixaban  5 mg Oral BID  . carvedilol  12.5 mg Oral BID WC  . furosemide  60 mg Intravenous Once  . gabapentin  200 mg Oral TID  . insulin aspart  0-15 Units Subcutaneous TID WC  . insulin aspart  0-5 Units Subcutaneous QHS  . multivitamin with minerals  1 tablet Oral Daily  . pantoprazole  40 mg Oral Daily  . quinapril  40 mg Oral Daily  . rosuvastatin  10 mg Oral QHS  . sertraline  100 mg Oral Daily  . vitamin B-12  1,000 mcg Oral Daily   Continuous Infusions:    LOS: 0 days   Marinda Elk  [facility]  04/14/2020, 9:05 AM

## 2020-04-14 NOTE — Plan of Care (Signed)

## 2020-04-15 DIAGNOSIS — E8881 Metabolic syndrome: Secondary | ICD-10-CM

## 2020-04-15 DIAGNOSIS — G9340 Encephalopathy, unspecified: Secondary | ICD-10-CM | POA: Diagnosis not present

## 2020-04-15 DIAGNOSIS — J9622 Acute and chronic respiratory failure with hypercapnia: Secondary | ICD-10-CM | POA: Diagnosis not present

## 2020-04-15 LAB — GLUCOSE, CAPILLARY
Glucose-Capillary: 132 mg/dL — ABNORMAL HIGH (ref 70–99)
Glucose-Capillary: 285 mg/dL — ABNORMAL HIGH (ref 70–99)
Glucose-Capillary: 94 mg/dL (ref 70–99)
Glucose-Capillary: 142 mg/dL — ABNORMAL HIGH (ref 70–99)

## 2020-04-15 LAB — BASIC METABOLIC PANEL
Anion gap: 9 (ref 5–15)
BUN: 16 mg/dL (ref 8–23)
CO2: 37 mmol/L — ABNORMAL HIGH (ref 22–32)
Calcium: 8.4 mg/dL — ABNORMAL LOW (ref 8.9–10.3)
Chloride: 93 mmol/L — ABNORMAL LOW (ref 98–111)
Creatinine, Ser: 0.57 mg/dL (ref 0.44–1.00)
GFR, Estimated: >60 mL/min (ref >60)
Glucose, Bld: 129 mg/dL — ABNORMAL HIGH (ref 70–99)
Potassium: 3.9 mmol/L (ref 3.5–5.1)
Sodium: 139 mmol/L (ref 135–145)

## 2020-04-15 MED ORDER — SODIUM CHLORIDE 0.9 % IV SOLN: INTRAVENOUS | Status: DC

## 2020-04-15 NOTE — Plan of Care (Signed)
  Problem: Education: Goal: Knowledge of General Education information will improve Description: Including pain rating scale, medication(s)/side effects and non-pharmacologic comfort measures Outcome: Progressing   Problem: Health Behavior/Discharge Planning: Goal: Ability to manage health-related needs will improve Outcome: Progressing   Problem: Clinical Measurements: Goal: Ability to maintain clinical measurements within normal limits will improve Outcome: Progressing Goal: Will remain free from infection Outcome: Progressing Goal: Diagnostic test results will improve Outcome: Progressing Goal: Respiratory complications will improve Outcome: Progressing Goal: Cardiovascular complication will be avoided Outcome: Progressing   Problem: Activity: Goal: Risk for activity intolerance will decrease Outcome: Progressing   Problem: Nutrition: Goal: Adequate nutrition will be maintained Outcome: Progressing   Problem: Coping: Goal: Level of anxiety will decrease Outcome: Progressing   Problem: Elimination: Goal: Will not experience complications related to bowel motility Outcome: Progressing Goal: Will not experience complications related to urinary retention Outcome: Progressing   Problem: Pain Managment: Goal: General experience of comfort will improve Outcome: Progressing   Problem: Safety: Goal: Ability to remain free from injury will improve Outcome: Progressing   Problem: Skin Integrity: Goal: Risk for impaired skin integrity will decrease Outcome: Progressing    Pt is AAOx4. Pt denies any pain. Pt was placed on cpap last night tolerated well. VSS. Will continue to monitor.

## 2020-04-15 NOTE — Progress Notes (Signed)
[facility] PROGRESS NOTE    Progress Note  [name]  HTD:[id number] DOB: 1940-10-03 DOA: 04/12/2020 PCP: Gracelyn Nurse, MD     Brief Narrative:   [name] is an 80 y.o. female past medical history significant for essential hypertension, chronic diastolic heart failure, non-insulin-dependent diabetes mellitus type 2, atrial fibrillation on Eliquis comes into the emergency room for acute encephalopathy, shortness of breath and fall. Significant studies: 04/12/2020 CT of the head showed no acute abnormalities mild to moderate sinus disease, mild cerebral atrophy. 04/12/2020 chest x-ray that showed mild pulmonary vascular congestion  Antibiotics: None  Microbiology data: Blood culture:  Procedures: None  Assessment/Plan:   Weakness and shortness of breath probably due to non-complaince with C-pap at home: She has been noncompliant with her Lasix at home. I's and O's have been poorly recorded. She appears euvolemic on physical exam. Her bicarb is up she is becoming hyperchloremic and hyponatremic, will start on gentle IV fluid hydration recheck in the morning. ? [facility]  Acute encephalopathy probably due to obstructive sleep apnea as she is not using CPAP at home: She not been using her BiPAP at home or here. She was started on BiPAP she seems more alert this morning.  Chronic respiratory failure with hypoxia due to COPD: Uses home dose of 2 L.  Essential hypertension: Her Coreg ACE and inhibitor, continue to hold ACE inhibitor. Blood pressure stable creatinine at baseline, CO2 the med is trending up. BP well controlled.  Debilitated and weakness: Physical therapy occupational therapy and TOC has been consulted as she might need placement she seems extremely debilitated.   Physical therapy evaluated the patient and the recommended skilled nursing facility. TOC has been consulted for possible placement.  A. fib on Eliquis: Resume Eliquis, rate  control  Non-insulin diabetes mellitus type 2: BG well controlled continue Actos will start on sliding scale insulin.  Obesity, Class III, BMI 40-49.9 (morbid obesity) (HCC) Noted counseled.    DVT prophylaxis: Eliquis Family Communication:none Status is: Observation  The patient remains OBS appropriate and will d/c before 2 midnights.  Dispo: The patient is from: Home              Anticipated d/c is to: SNF              Patient currently is not medically stable to d/c.   Difficult to place patient No  Code Status:     Code Status Orders  (From admission, onward)         Start     Ordered   04/12/20 1731  Do not attempt resuscitation (DNR)  Continuous       Question Answer Comment  In the event of cardiac or respiratory ARREST Do not call a "code blue"   In the event of cardiac or respiratory ARREST Do not perform Intubation, CPR, defibrillation or ACLS   In the event of cardiac or respiratory ARREST Use medication by any route, position, wound care, and other measures to relive pain and suffering. May use oxygen, suction and manual treatment of airway obstruction as needed for comfort.      04/12/20 1731        Code Status History    Date Active Date Inactive Code Status Order ID Comments User Context   07/29/2019 1821 08/01/2019 2126 Full Code [id number]  Dorcas Carrow, MD Inpatient   04/09/2011 1154 04/10/2011 1504 Full Code [id number]  Allred, Truett Mainland, RN Inpatient   Advance Care Planning Activity  Advance Directive Documentation   Flowsheet Row Most Recent Value  Type of Advance Directive Healthcare Power of Attorney, Living will  Pre-existing out of facility DNR order (yellow form or pink MOST form) --  "MOST" Form in Place? --        IV Access:    Peripheral IV   Procedures and diagnostic studies:   No results found.   Medical Consultants:    None.   Subjective:    [name] no complains this am, hungry.  Objective:    Vitals:    04/14/20 2003 04/14/20 2230 04/15/20 0412 04/15/20 0735  BP: (!) 108/50  (!) 129/53 (!) 118/59  Pulse: 69  69 72  Resp: 17  18 18   Temp: 98.6 F (37 C)  98.6 F (37 C) 98.5 F (36.9 C)  TempSrc:    Axillary  SpO2: 97% 95% 97% 96%  Weight:   115.7 kg   Height:       SpO2: 96 % O2 Flow Rate (L/min): 3 L/min FiO2 (%): 32 %   Intake/Output Summary (Last 24 hours) at 04/15/2020 0929 Last data filed at 04/14/2020 1554 Gross per 24 hour  Intake 309.44 ml  Output --  Net 309.44 ml   Filed Weights   04/12/20 2000 04/14/20 0424 04/15/20 0412  Weight: 115.6 kg 115.5 kg 115.7 kg    Exam: General exam: In no acute distress. Respiratory system: Good air movement and clear to auscultation. Cardiovascular system: S1 & S2 heard, RRR. No JVD. Gastrointestinal system: Abdomen is nondistended, soft and nontender.  Extremities: No pedal edema. Skin: No rashes, lesions or ulcers Psychiatry: Judgement and insight appear normal. Mood & affect appropriate.   Data Reviewed:    Labs: Basic Metabolic Panel: Recent Labs  Lab 04/12/20 1312 04/13/20 0426 04/14/20 0447 04/15/20 0400  NA 143 143 141 139  K 3.4* 3.6 3.4* 3.9  CL 98 98 95* 93*  CO2 35* 37* 38* 37*  GLUCOSE 155* 167* 145* 129*  BUN 25* 18 14 16   CREATININE 1.07* 0.66 0.60 0.57  CALCIUM 8.9 8.7* 8.8* 8.4*  MG  --  1.6*  --   --    GFR Estimated Creatinine Clearance: 72.2 mL/min (by C-G formula based on SCr of 0.57 mg/dL). Liver Function Tests: Recent Labs  Lab 04/12/20 1312  AST 13*  ALT 9  ALKPHOS 55  BILITOT 0.6  PROT 6.8  ALBUMIN 3.0*   No results for input(s): LIPASE, AMYLASE in the last 168 hours. Recent Labs  Lab 04/12/20 1745  AMMONIA 18   Coagulation profile No results for input(s): INR, PROTIME in the last 168 hours. COVID-19 Labs  No results for input(s): DDIMER, FERRITIN, LDH, CRP in the last 72 hours.  Lab Results  Component Value Date   SARSCOV2NAA NEGATIVE 04/12/2020   SARSCOV2NAA  NEGATIVE 10/25/2019   SARSCOV2NAA NEGATIVE 07/29/2019   SARSCOV2NAA NEGATIVE 01/14/2019    CBC: Recent Labs  Lab 04/12/20 1312 04/13/20 0426  WBC 11.5* 9.9  HGB 9.9* 9.7*  HCT 34.1* 32.0*  MCV 99.1 97.3  PLT 194 195   Cardiac Enzymes: No results for input(s): CKTOTAL, CKMB, CKMBINDEX, TROPONINI in the last 168 hours. BNP (last 3 results) No results for input(s): PROBNP in the last 8760 hours. CBG: Recent Labs  Lab 04/14/20 0824 04/14/20 1140 04/14/20 1705 04/14/20 2004 04/15/20 0739  GLUCAP 134* 119* 137* 128* 132*   D-Dimer: No results for input(s): DDIMER in the last 72 hours. Hgb A1c: Recent Labs  04/12/20 1745  HGBA1C 5.8*   Lipid Profile: No results for input(s): CHOL, HDL, LDLCALC, TRIG, CHOLHDL, LDLDIRECT in the last 72 hours. Thyroid function studies: No results for input(s): TSH, T4TOTAL, T3FREE, THYROIDAB in the last 72 hours.  Invalid input(s): FREET3 Anemia work up: No results for input(s): VITAMINB12, FOLATE, FERRITIN, TIBC, IRON, RETICCTPCT in the last 72 hours. Sepsis Labs: Recent Labs  Lab 04/12/20 1312 04/12/20 1507 04/12/20 1745 04/13/20 0426  PROCALCITON  --   --  <0.10  --   WBC 11.5*  --   --  9.9  LATICACIDVEN  --  0.9 0.8  --    Microbiology Recent Results (from the past 240 hour(s))  SARS CORONAVIRUS 2 (TAT 6-24 HRS) Nasopharyngeal Nasopharyngeal Swab     Status: None   Collection Time: 04/12/20  5:30 PM   Specimen: Nasopharyngeal Swab  Result Value Ref Range Status   SARS Coronavirus 2 NEGATIVE NEGATIVE Final    Comment: (NOTE) SARS-CoV-2 target nucleic acids are NOT DETECTED.  The SARS-CoV-2 RNA is generally detectable in upper and lower respiratory specimens during the acute phase of infection. Negative results do not preclude SARS-CoV-2 infection, do not rule out co-infections with other pathogens, and should not be used as the sole basis for treatment or other patient management decisions. Negative results must be  combined with clinical observations, patient history, and epidemiological information. The expected result is Negative.  Fact Sheet for Patients: HairSlick.no  Fact Sheet for Healthcare Providers: quierodirigir.com  This test is not yet approved or cleared by the [place] FDA and  has been authorized for detection and/or diagnosis of SARS-CoV-2 by FDA under an Emergency Use Authorization (EUA). This EUA will remain  in effect (meaning this test can be used) for the duration of the COVID-19 declaration under Se ction 564(b)(1) of the Act, 21 U.S.C. section 360bbb-3(b)(1), unless the authorization is terminated or revoked sooner.  Performed at [facility], [address] [place], [place] [address]      Medications:   . apixaban  5 mg Oral BID  . carvedilol  12.5 mg Oral BID WC  . furosemide  60 mg Intravenous Once  . gabapentin  200 mg Oral TID  . insulin aspart  0-15 Units Subcutaneous TID WC  . insulin aspart  0-5 Units Subcutaneous QHS  . multivitamin with minerals  1 tablet Oral Daily  . pantoprazole  40 mg Oral Daily  . potassium chloride  20 mEq Oral BID  . quinapril  40 mg Oral Daily  . rosuvastatin  10 mg Oral QHS  . sertraline  100 mg Oral Daily  . vitamin B-12  1,000 mcg Oral Daily   Continuous Infusions:    LOS: 1 day   Marinda Elk  [facility]  04/15/2020, 9:29 AM

## 2020-04-15 NOTE — Progress Notes (Signed)
Pt had yellow mews at 20:40 for BP 100/49 and HR 64. APP was notified at 22:15. Rechecked pt's BP 102/52 and HR 70 now. APP recommends CPAP.

## 2020-04-16 ENCOUNTER — Encounter: Payer: Medicare Other | Admitting: Physical Therapy

## 2020-04-16 ENCOUNTER — Inpatient Hospital Stay: Payer: Medicare Other

## 2020-04-16 DIAGNOSIS — R531 Weakness: Secondary | ICD-10-CM

## 2020-04-16 LAB — GLUCOSE, CAPILLARY
Glucose-Capillary: 129 mg/dL — ABNORMAL HIGH (ref 70–99)
Glucose-Capillary: 155 mg/dL — ABNORMAL HIGH (ref 70–99)
Glucose-Capillary: 170 mg/dL — ABNORMAL HIGH (ref 70–99)
Glucose-Capillary: 202 mg/dL — ABNORMAL HIGH (ref 70–99)

## 2020-04-16 LAB — RESP PANEL BY RT-PCR (FLU A&B, COVID) ARPGX2
Influenza A by PCR: NEGATIVE
Influenza B by PCR: NEGATIVE
SARS Coronavirus 2 by RT PCR: NEGATIVE

## 2020-04-16 LAB — BASIC METABOLIC PANEL
Anion gap: 8 (ref 5–15)
BUN: 24 mg/dL — ABNORMAL HIGH (ref 8–23)
CO2: 37 mmol/L — ABNORMAL HIGH (ref 22–32)
Calcium: 7.9 mg/dL — ABNORMAL LOW (ref 8.9–10.3)
Chloride: 96 mmol/L — ABNORMAL LOW (ref 98–111)
Creatinine, Ser: 0.66 mg/dL (ref 0.44–1.00)
GFR, Estimated: >60 mL/min (ref >60)
Glucose, Bld: 116 mg/dL — ABNORMAL HIGH (ref 70–99)
Potassium: 4.2 mmol/L (ref 3.5–5.1)
Sodium: 141 mmol/L (ref 135–145)

## 2020-04-16 MED ORDER — PREDNISONE 20 MG PO TABS
40.0000 mg | ORAL_TABLET | Freq: Every day | ORAL | Status: DC
  Administered 2020-04-16 – 2020-04-17 (×2): 40 mg via ORAL
  Filled 2020-04-16 (×2): qty 2

## 2020-04-16 MED ORDER — PREDNISONE 20 MG PO TABS: 40.0000 mg | ORAL_TABLET | Freq: Every day | ORAL | 0 refills | Status: AC

## 2020-04-16 NOTE — Progress Notes (Addendum)
Physical Therapy Treatment Patient Details Name: [name] MRN: [id number] DOB: 1940-04-04 Today's Date: 04/16/2020    History of Present Illness [name] is a 80 y.o. female with medical history significant for hypertension, morbid obesity, heart failure preserved ejection fraction (grade 3 diastolic dysfunction), hypertension, hyperlipidemia, non-insulin-dependent diabetes mellitus, metabolic syndrome, atrial fibrillation on Eliquis, presented to the emergency department from home for chief concerns of fall, confusion, shortness of breath.    PT Comments    Continue on transfer orders received. Pt continues to present with deficits consistent with PT evaluation and current goals remain appropriate. Pt seen for co-tx with OT. Pt more alert & engaging with therapists during session but still requires total assist +2 for supine<>sit but does tolerate sitting EOB a total of ~15 minutes with up to max assist 2/2 posterior lean. Pt with c/o wrist pain but reports MD states it's 2/2 gout. Pt is able to perform BLE strengthening exercises seated EOB and engage in grooming tasks. Pt is motivated to get better, stating, "I want to walk again". Will continue to follow pt acutely to progress independence with bed mobility, transfers, and gait as able. Pt would benefit from STR upon d/c to maximize independence with functional mobility & reduce fall risk prior to return home.  Addendum: Pt on 2L/min via nasal cannula during session.   Follow Up Recommendations  SNF     Equipment Recommendations  None recommended by PT    Recommendations for Other Services       Precautions / Restrictions Precautions Precautions: Fall Restrictions Weight Bearing Restrictions: No    Mobility  Bed Mobility   Bed Mobility: Supine to Sit;Sit to Supine     Supine to sit: Max assist;Total assist;+2 for physical assistance Sit to supine: Total assist;+2 for physical assistance   General bed mobility  comments: Pt attempts to scoot BLE to EOB & reach for bed rail but ultimately requires significant (max/total) assist for supine<>sit.    Transfers                    Ambulation/Gait                 Stairs             Wheelchair Mobility    Modified Rankin (Stroke Patients Only)       Balance Overall balance assessment: Needs assistance Sitting-balance support: Bilateral upper extremity supported;Feet supported Sitting balance-Leahy Scale: Zero Sitting balance - Comments: mod/max assist Postural control: Posterior lean;Right lateral lean                                  Cognition Arousal/Alertness: Awake/alert Behavior During Therapy: WFL for tasks assessed/performed Overall Cognitive Status: Difficult to assess                                 General Comments: More alert compared to last PT session, joking with therapists at times. AxO x 4      Exercises General Exercises - Lower Extremity Long Arc Quad: AROM;Strengthening;Right;Left;10 reps;Seated    General Comments        Pertinent Vitals/Pain Pain Assessment: Faces Faces Pain Scale: Hurts whole lot Pain Location: B wrist Pain Descriptors / Indicators: Grimacing;Guarding Pain Intervention(s): Limited activity within patient's tolerance;Monitored during session;Repositioned    Home Living  Prior Function            PT Goals (current goals can now be found in the care plan section) Acute Rehab PT Goals Patient Stated Goal: to feel better PT Goal Formulation: With patient Time For Goal Achievement: 04/27/20 Potential to Achieve Goals: Fair Progress towards PT goals: Progressing toward goals    Frequency    Min 2X/week      PT Plan Current plan remains appropriate    Co-evaluation  PT/OT co-tx 2/2 complexity of pt's impairments (multisystem involvement); for patient/therapist safety; to address functional/ADL  transfers PT goals addressed during session: mobility/safety with mobility, balance            AM-PAC PT "6 Clicks" Mobility   Outcome Measure  Help needed turning from your back to your side while in a flat bed without using bedrails?: Total Help needed moving from lying on your back to sitting on the side of a flat bed without using bedrails?: Total Help needed moving to and from a bed to a chair (including a wheelchair)?: Total Help needed standing up from a chair using your arms (e.g., wheelchair or bedside chair)?: Total Help needed to walk in hospital room?: Total Help needed climbing 3-5 steps with a railing? : Total 6 Click Score: 6    End of Session Equipment Utilized During Treatment: Gait belt Activity Tolerance: Patient tolerated treatment well Patient left: in bed;with bed alarm set;with call bell/phone within reach   PT Visit Diagnosis: Difficulty in walking, not elsewhere classified (R26.2);Muscle weakness (generalized) (M62.81);Pain Pain - Right/Left:  (B wrists) Pain - part of body:  (B wrists)     Time: 8588-5027 PT Time Calculation (min) (ACUTE ONLY): 33 min  Charges:  $Therapeutic Activity: 8-22 mins                     Aleda Grana, PT, DPT 04/16/20, 2:00 PM    Sandi Mariscal 04/16/2020, 1:48 PM

## 2020-04-16 NOTE — Progress Notes (Signed)
pericare was done this morning pt had small BM. purewick was changed to new one.

## 2020-04-16 NOTE — Progress Notes (Signed)
Pt ate 33% of dinner and finished all her fruit cup. Drank 100 ml of iced tea.

## 2020-04-16 NOTE — Discharge Summary (Addendum)
Physician Discharge Summary  [name] WVP:[id number] DOB: 10/15/1940 DOA: 04/12/2020  PCP: Gracelyn Nurse, MD  Admit date: 04/12/2020 Discharge date: 04/16/2020  Admitted From: Home Disposition:  SNF  Recommendations for Outpatient Follow-up:  Follow up with PCP in 1-2 weeks Please obtain BMP/CBC in one week Make sure she is wearing her CPAP at night.  Home Health:no Equipment/Devices:None  Discharge Condition:Stable CODE STATUS:Full Diet recommendation: Heart Healthy  Brief/Interim Summary: 80 y.o. female past medical history significant for essential hypertension, chronic diastolic heart failure, non-insulin-dependent diabetes mellitus type 2, atrial fibrillation on Eliquis comes into the emergency room for acute encephalopathy, shortness of breath and fall. Significant studies: 04/12/2020 CT of the head showed no acute abnormalities mild to moderate sinus disease, mild cerebral atrophy. 04/12/2020 chest x-ray that showed mild pulmonary vascular congestion  Discharge Diagnoses:  Active Problems:   DDD (degenerative disc disease), cervical   Weakness   Obesity, Class III, BMI 40-49.9 (morbid obesity) (HCC)   Obesity, diabetes, and hypertension syndrome (HCC)   Metabolic syndrome   Shortness of breath Generalized weakness likely due to hypercarbia in the setting of not using her CPAP at night. Chest x-ray show mild pulmonary edema with a BNP greater than 100, she was given IV Lasix and it improved. She appears euvolemic physical exam lasted 24 hours. She was started on a CPAP in the hospital at night and she has been stable.  Acute encephalopathy probably due to obstructive sleep apnea/obesity hypoventilation syndrome probably due to hypercarbia: Not using her BiPAP at night she was placed on the BiPAP here in the hospital and she remained stable.  Acute gout flare: Multifacet gout x-ray of the wrist bilaterally did not show a fracture. She was started on prednisone and  she showed improvement in her pain.  Chronic respiratory failure with hypoxia due to COPD: Continue oxygen at home as an outpatient.  Essential hypertension: No change made to her medication.  Debilitated and weakness: Physical therapy evaluated the patient and recommended skilled nursing facility.  Chronic atrial fibrillation: Rate controlled metoprolol and Eliquis continue current regimen no changes made.  Non-insulin-dependent diabetes mellitus type 2: No change made to her medication continue current regimen.   Discharge Instructions  Discharge Instructions     Diet - low sodium heart healthy   Complete by: As directed    Increase activity slowly   Complete by: As directed       Allergies as of 04/16/2020       Reactions   Ciprofloxacin Hcl Itching   Rash and itching all over stomach and arms   Dopamine Nausea And Vomiting   Sulfa Antibiotics Rash   Sulfacetamide Sodium Rash   Tegaderm Ag Mesh [silver] Other (See Comments), Rash   Blisters and takes skin off        Medication List     STOP taking these medications    amoxicillin-clavulanate 875-125 MG tablet Commonly known as: AUGMENTIN       TAKE these medications    albuterol 108 (90 Base) MCG/ACT inhaler Commonly known as: VENTOLIN HFA Inhale 2 puffs into the lungs every 4 (four) hours as needed for wheezing or shortness of breath.   alendronate 70 MG tablet Commonly known as: FOSAMAX Take 70 mg by mouth every Monday.   benzonatate 200 MG capsule Commonly known as: TESSALON Take 200 mg by mouth 3 (three) times daily as needed for cough.   carvedilol 12.5 MG tablet Commonly known as: COREG Take 12.5 mg by mouth  2 (two) times daily with a meal.   Cholecalciferol 25 MCG (1000 UT) tablet Take 1,000 Units by mouth daily.   Cyanocobalamin 1000 MCG Tbcr Take 1,000 mcg by mouth daily.   diazepam 2 MG tablet Commonly known as: VALIUM Take 2 mg by mouth 2 (two) times daily.   Eliquis 5 MG  Tabs tablet Generic drug: apixaban Take 5 mg by mouth 2 (two) times daily.   furosemide 20 MG tablet Commonly known as: LASIX Take 10 mg by mouth daily.   gabapentin 100 MG capsule Commonly known as: NEURONTIN Take 200 mg by mouth 3 (three) times daily.   metFORMIN 500 MG tablet Commonly known as: GLUCOPHAGE Take 500 mg by mouth 2 (two) times daily with a meal.   multivitamin with minerals tablet Take 1 tablet by mouth daily.   omeprazole 20 MG capsule Commonly known as: PRILOSEC Take 20 mg by mouth daily.   pioglitazone 15 MG tablet Commonly known as: ACTOS Take 15 mg by mouth daily.   predniSONE 20 MG tablet Commonly known as: DELTASONE Take 2 tablets (40 mg total) by mouth daily with breakfast for 5 days.   quinapril 20 MG tablet Commonly known as: ACCUPRIL Take 10-20 mg by mouth See admin instructions. Take 20 mg in the morning, and 10 mg in the evening   rosuvastatin 10 MG tablet Commonly known as: CRESTOR Take 10 mg by mouth daily.   sertraline 100 MG tablet Commonly known as: ZOLOFT Take 100 mg by mouth daily.         Allergies  Allergen Reactions   Ciprofloxacin Hcl Itching    Rash and itching all over stomach and arms   Dopamine Nausea And Vomiting   Sulfa Antibiotics Rash   Sulfacetamide Sodium Rash   Tegaderm Ag Mesh [Silver] Other (See Comments) and Rash    Blisters and takes skin off    Consultations: None   Procedures/Studies: DG Chest 2 View  Result Date: 04/12/2020 CLINICAL DATA:  Weakness EXAM: CHEST - 2 VIEW COMPARISON:  None. FINDINGS: The heart size and mediastinal contours are mildly enlarged. Aortic knob calcifications are seen. There is prominence of the central pulmonary vasculature. Calcified granuloma is noted within the right lung base. A left-sided pacemaker seen with the lead tips in the right atrium right ventricle. The visualized skeletal structures are unremarkable. Cervical fixation is noted. IMPRESSION: Mild pulmonary  vascular congestion. Electronically Signed   By: Jonna ClarkBindu  Avutu M.D.   On: 04/12/2020 16:34   DG Wrist Complete Right  Result Date: 04/12/2020 CLINICAL DATA:  Right wrist pain after fall. EXAM: RIGHT WRIST - COMPLETE 3+ VIEW COMPARISON:  None. FINDINGS: There is no evidence of fracture or dislocation. There is no evidence of arthropathy or other focal bone abnormality. Osteopenia. Soft tissues are unremarkable. IMPRESSION: Negative. Electronically Signed   By: Obie DredgeWilliam T Derry M.D.   On: 04/12/2020 14:02   CT Head Wo Contrast  Result Date: 04/12/2020 CLINICAL DATA:  Status post fall. EXAM: CT HEAD WITHOUT CONTRAST TECHNIQUE: Contiguous axial images were obtained from the base of the skull through the vertex without intravenous contrast. COMPARISON:  October 25, 2019 FINDINGS: Brain: There is mild cerebral atrophy with widening of the extra-axial spaces and ventricular dilatation. There are areas of decreased attenuation within the white matter tracts of the supratentorial brain, consistent with microvascular disease changes. Vascular: No hyperdense vessel or unexpected calcification. Skull: Normal. Negative for fracture or focal lesion. Sinuses/Orbits: There is mild sphenoid sinus and mild left maxillary  sinus mucosal thickening. Moderate severity bilateral ethmoid sinus mucosal thickening is seen. Other: None. IMPRESSION: 1. Mild cerebral atrophy. 2. No acute intracranial abnormality. 3. Mild to moderate severity paranasal sinus disease. Electronically Signed   By: Aram Candela M.D.   On: 04/12/2020 16:04   (Echo, Carotid, EGD, Colonoscopy, ERCP)    Subjective: Relates her wrist pain is improved.  Discharge Exam: Vitals:   04/16/20 0415 04/16/20 0730  BP: (!) 94/51 (!) 115/52  Pulse: 70 69  Resp: 20 17  Temp: 99.5 F (37.5 C) 98.3 F (36.8 C)  SpO2: 96% 100%   Vitals:   04/16/20 0056 04/16/20 0415 04/16/20 0428 04/16/20 0730  BP: (!) 112/51 (!) 94/51  (!) 115/52  Pulse: 70 70  69   Resp: 18 20  17   Temp: 98.9 F (37.2 C) 99.5 F (37.5 C)  98.3 F (36.8 C)  TempSrc:  Oral    SpO2: 94% 96%  100%  Weight:   117 kg   Height:        General: Pt is alert, awake, not in acute distress Cardiovascular: RRR, S1/S2 +, no rubs, no gallops Respiratory: CTA bilaterally, no wheezing, no rhonchi Abdominal: Soft, NT, ND, bowel sounds + Extremities: no edema, no cyanosis    The results of significant diagnostics from this hospitalization (including imaging, microbiology, ancillary and laboratory) are listed below for reference.     Microbiology: Recent Results (from the past 240 hour(s))  SARS CORONAVIRUS 2 (TAT 6-24 HRS) Nasopharyngeal Nasopharyngeal Swab     Status: None   Collection Time: 04/12/20  5:30 PM   Specimen: Nasopharyngeal Swab  Result Value Ref Range Status   SARS Coronavirus 2 NEGATIVE NEGATIVE Final    Comment: (NOTE) SARS-CoV-2 target nucleic acids are NOT DETECTED.  The SARS-CoV-2 RNA is generally detectable in upper and lower respiratory specimens during the acute phase of infection. Negative results do not preclude SARS-CoV-2 infection, do not rule out co-infections with other pathogens, and should not be used as the sole basis for treatment or other patient management decisions. Negative results must be combined with clinical observations, patient history, and epidemiological information. The expected result is Negative.  Fact Sheet for Patients: 06/12/20  Fact Sheet for Healthcare Providers: HairSlick.no  This test is not yet approved or cleared by the [place] FDA and  has been authorized for detection and/or diagnosis of SARS-CoV-2 by FDA under an Emergency Use Authorization (EUA). This EUA will remain  in effect (meaning this test can be used) for the duration of the COVID-19 declaration under Se ction 564(b)(1) of the Act, 21 U.S.C. section 360bbb-3(b)(1), unless the  authorization is terminated or revoked sooner.  Performed at [facility], [address] [place], [place]      Labs: BNP (last 3 results) Recent Labs    10/25/19 0320 04/12/20 2303  BNP 186.5* 111.8*   Basic Metabolic Panel: Recent Labs  Lab 04/12/20 1312 04/13/20 0426 04/14/20 0447 04/15/20 0400 04/16/20 0503  NA 143 143 141 139 141  K 3.4* 3.6 3.4* 3.9 4.2  CL 98 98 95* 93* 96*  CO2 35* 37* 38* 37* 37*  GLUCOSE 155* 167* 145* 129* 116*  BUN 25* 18 14 16  24*  CREATININE 1.07* 0.66 0.60 0.57 0.66  CALCIUM 8.9 8.7* 8.8* 8.4* 7.9*  MG  --  1.6*  --   --   --    Liver Function Tests: Recent Labs  Lab 04/12/20 1312  AST 13*  ALT 9  ALKPHOS 55  BILITOT 0.6  PROT 6.8  ALBUMIN 3.0*   No results for input(s): LIPASE, AMYLASE in the last 168 hours. Recent Labs  Lab 04/12/20 1745  AMMONIA 18   CBC: Recent Labs  Lab 04/12/20 1312 04/13/20 0426  WBC 11.5* 9.9  HGB 9.9* 9.7*  HCT 34.1* 32.0*  MCV 99.1 97.3  PLT 194 195   Cardiac Enzymes: No results for input(s): CKTOTAL, CKMB, CKMBINDEX, TROPONINI in the last 168 hours. BNP: Invalid input(s): POCBNP CBG: Recent Labs  Lab 04/15/20 0739 04/15/20 1151 04/15/20 1618 04/15/20 2038 04/16/20 0731  GLUCAP 132* 285* 94 142* 129*   D-Dimer No results for input(s): DDIMER in the last 72 hours. Hgb A1c No results for input(s): HGBA1C in the last 72 hours. Lipid Profile No results for input(s): CHOL, HDL, LDLCALC, TRIG, CHOLHDL, LDLDIRECT in the last 72 hours. Thyroid function studies No results for input(s): TSH, T4TOTAL, T3FREE, THYROIDAB in the last 72 hours.  Invalid input(s): FREET3 Anemia work up No results for input(s): VITAMINB12, FOLATE, FERRITIN, TIBC, IRON, RETICCTPCT in the last 72 hours. Urinalysis    Component Value Date/Time   COLORURINE Yellow 12/07/2012 1209   APPEARANCEUR Cloudy (A) 02/17/2018 1247   LABSPEC 1.012 12/07/2012 1209   PHURINE 6.0 12/07/2012 1209    GLUCOSEU Negative 02/17/2018 1247   GLUCOSEU Negative 12/07/2012 1209   HGBUR 1+ 12/07/2012 1209   BILIRUBINUR Negative 02/17/2018 1247   BILIRUBINUR Negative 12/07/2012 1209   KETONESUR Negative 12/07/2012 1209   PROTEINUR 1+ (A) 02/17/2018 1247   PROTEINUR Negative 12/07/2012 1209   NITRITE Negative 02/17/2018 1247   NITRITE Positive 12/07/2012 1209   LEUKOCYTESUR 1+ (A) 02/17/2018 1247   LEUKOCYTESUR 1+ 12/07/2012 1209   Sepsis Labs Invalid input(s): PROCALCITONIN,  WBC,  LACTICIDVEN Microbiology Recent Results (from the past 240 hour(s))  SARS CORONAVIRUS 2 (TAT 6-24 HRS) Nasopharyngeal Nasopharyngeal Swab     Status: None   Collection Time: 04/12/20  5:30 PM   Specimen: Nasopharyngeal Swab  Result Value Ref Range Status   SARS Coronavirus 2 NEGATIVE NEGATIVE Final    Comment: (NOTE) SARS-CoV-2 target nucleic acids are NOT DETECTED.  The SARS-CoV-2 RNA is generally detectable in upper and lower respiratory specimens during the acute phase of infection. Negative results do not preclude SARS-CoV-2 infection, do not rule out co-infections with other pathogens, and should not be used as the sole basis for treatment or other patient management decisions. Negative results must be combined with clinical observations, patient history, and epidemiological information. The expected result is Negative.  Fact Sheet for Patients: HairSlick.no  Fact Sheet for Healthcare Providers: quierodirigir.com  This test is not yet approved or cleared by the [place] FDA and  has been authorized for detection and/or diagnosis of SARS-CoV-2 by FDA under an Emergency Use Authorization (EUA). This EUA will remain  in effect (meaning this test can be used) for the duration of the COVID-19 declaration under Se ction 564(b)(1) of the Act, 21 U.S.C. section 360bbb-3(b)(1), unless the authorization is terminated or revoked sooner.  Performed  at [facility], [address] [place], [place] [address]      Time coordinating discharge: Over 30 minutes  SIGNED:   Marinda Elk, MD  [facility] 04/16/2020, 8:45 AM Pager   If 7PM-7AM, please contact night-coverage [email or web address] Password [email or web address]

## 2020-04-16 NOTE — TOC Progression Note (Signed)
Transition of Care [facility]) - Progression Note    Patient Details  Name: [name] MRN: [id number] Date of Birth: 02-13-1940  [facility] CM/SW Contact  Hetty Ely, RN Phone Number: 04/16/2020, 4:24 PM  Clinical Narrative:  Spoke with patient and Reeves Dam who was at bedside about [facility] SNF acceptance, they did inquire about [facility] because she was there before, informed that [facility] declined. They both agreed to accept [facility] in [place].   Amalia Greenhouse, spoke with Admissions and they approve admitting patient after Insurance Authorization, which was submitted this afternoon.    Expected Discharge Plan: Skilled Nursing Facility Barriers to Discharge: Continued Medical Work up  Expected Discharge Plan and Services Expected Discharge Plan: Skilled Nursing Facility   Discharge Planning Services: CM Consult Post Acute Care Choice: Skilled Nursing Facility Living arrangements for the past 2 months: Apartment Expected Discharge Date: 04/16/20               DME Arranged: N/A DME Agency: NA       HH Arranged: NA           Social Determinants of Health (SDOH) Interventions    Readmission Risk Interventions No flowsheet data found.

## 2020-04-16 NOTE — Plan of Care (Signed)

## 2020-04-16 NOTE — Progress Notes (Signed)
   04/15/20 2040  Assess: MEWS Score  Temp 99.4 F (37.4 C)  BP (!) 100/49  Pulse Rate 70  Resp 20  SpO2 98 %  O2 Device Nasal Cannula  O2 Flow Rate (L/min) 3 L/min  Assess: MEWS Score  MEWS Temp 0  MEWS Systolic 1  MEWS Pulse 0  MEWS RR 0  MEWS LOC 1  MEWS Score 2  MEWS Score Color Yellow  Assess: if the MEWS score is Yellow or Red  Were vital signs taken at a resting state? Yes  Focused Assessment No change from prior assessment  Early Detection of Sepsis Score *See Row Information* Low  MEWS guidelines implemented *See Row Information* Yes  Treat  MEWS Interventions Escalated (See documentation below)  Take Vital Signs  Increase Vital Sign Frequency  Yellow: Q 2hr X 2 then Q 4hr X 2, if remains yellow, continue Q 4hrs  Escalate  MEWS: Escalate Yellow: discuss with charge nurse/RN and consider discussing with provider and RRT  Notify: Charge Nurse/RN  Name of Charge Nurse/RN Notified Diannia Ruder  Date Charge Nurse/RN Notified 04/15/20  Time Charge Nurse/RN Notified 2100  Notify: Provider  Provider Name/Title Manuela Schwartz  Date Provider Notified 04/16/20  Time Provider Notified 2213  Notification Type  (chat)  Notification Reason Other (Comment) (yellow mews)  Provider response See new orders  Date of Provider Response 04/15/20  Time of Provider Response 2230  Inserted for Benita Gutter RN

## 2020-04-16 NOTE — Progress Notes (Signed)
Occupational Therapy Treatment Patient Details Name: MEZTLI LLANAS MRN: [id number] DOB: Sep 19, 1940 Today's Date: 04/16/2020    History of present illness ZAMARIAH SEABORN is a 80 y.o. female with medical history significant for hypertension, morbid obesity, heart failure preserved ejection fraction (grade 3 diastolic dysfunction), hypertension, hyperlipidemia, non-insulin-dependent diabetes mellitus, metabolic syndrome, atrial fibrillation on Eliquis, presented to the emergency department from home for chief concerns of fall, confusion, shortness of breath.   OT comments  Pt seen for OT/PT co-treatment on this date. Upon arrival to room pt awake, seated upright in bed. Pt agreeable to tx. Pt with improved cognition this date, talking and joking with therapists at times. Pt required MAX A for bed-level LB dressing, MAX A +2-TOTAL A for bed mobility, and MAX A to wash/dry face while seated EOB. Pt is making good progress towards goals, and was able to tolerate sitting EOB for ~85mins and participate in seated ADLs and LE therex, requiring intermittent MOD-MAX A for posterior/lateral lean in setting of fatigue. Pt continues to benefit from skilled OT services to maximize return to PLOF and minimize risk of future falls, injury, caregiver burden, and readmission. Will continue to follow POC. Discharge recommendation remains appropriate.    Follow Up Recommendations  SNF    Equipment Recommendations  3 in 1 bedside commode       Precautions / Restrictions Precautions Precautions: Fall Restrictions Weight Bearing Restrictions: No       Mobility Bed Mobility   Bed Mobility: Supine to Sit;Sit to Supine     Supine to sit: Max assist;+2 for physical assistance Sit to supine: Total assist;+2 for physical assistance   General bed mobility comments: Pt initiated moving BLE towards EOB & reaching for bed rail with UE    Transfers                 General transfer comment: Did not attempt  d/t poor static sitting balance    Balance Overall balance assessment: Needs assistance Sitting-balance support: Bilateral upper extremity supported;Feet supported Sitting balance-Leahy Scale: Zero Sitting balance - Comments: Able to tolerate sitting EOB for ~21mins and participate in seated ADLs, requiring intermittent MOD-MAX A for posterior/lateral lean in setting of fatigue Postural control: Posterior lean;Right lateral lean                                 ADL either performed or assessed with clinical judgement   ADL Overall ADL's : Needs assistance/impaired   Eating/Feeding Details (indicate cue type and reason): Pt reports that she has difficulty bring utensils to mouth, requiring assistance from RN/family; OT did not assess this session Grooming: Wash/dry face;Maximal assistance;Sitting Grooming Details (indicate cue type and reason): Pt put forth good effort however required physical assist to bring washcloth to face and wash/dry face d/t limited UE ROM and wrist pain             Lower Body Dressing: Maximal assistance;Bed level Lower Body Dressing Details (indicate cue type and reason): To don shoes             Functional mobility during ADLs: Maximal assistance;+2 for physical assistance                 Cognition Arousal/Alertness: Awake/alert Behavior During Therapy: WFL for tasks assessed/performed Overall Cognitive Status: No family/caregiver present to determine baseline cognitive functioning  General Comments: Pt agreeable to session, talking and joking with therapists at times.                   Pertinent Vitals/ Pain       Pain Assessment: Faces Faces Pain Scale: Hurts whole lot Pain Location: b/l wrist Pain Descriptors / Indicators: Grimacing;Guarding Pain Intervention(s): Limited activity within patient's tolerance;Monitored during session;Repositioned   Frequency  Min 1X/week         Progress Toward Goals  OT Goals(current goals can now be found in the care plan section)  Progress towards OT goals: Progressing toward goals  Acute Rehab OT Goals Patient Stated Goal: to be able to stand OT Goal Formulation: With patient Time For Goal Achievement: 04/27/20 Potential to Achieve Goals: Good  Plan Discharge plan remains appropriate;Frequency remains appropriate    Co-evaluation    PT/OT/SLP Co-Evaluation/Treatment: Yes Reason for Co-Treatment: Complexity of the patient's impairments (multi-system involvement);For patient/therapist safety;To address functional/ADL transfers PT goals addressed during session: Mobility/safety with mobility;Balance OT goals addressed during session: ADL's and self-care      AM-PAC OT "6 Clicks" Daily Activity     Outcome Measure   Help from another person eating meals?: A Little   Help from another person toileting, which includes using toliet, bedpan, or urinal?: A Lot Help from another person bathing (including washing, rinsing, drying)?: A Lot Help from another person to put on and taking off regular upper body clothing?: A Lot Help from another person to put on and taking off regular lower body clothing?: A Lot 6 Click Score: 11    End of Session    OT Visit Diagnosis: Unsteadiness on feet (R26.81);Muscle weakness (generalized) (M62.81)   Activity Tolerance Patient tolerated treatment well   Patient Left in bed;with call bell/phone within reach;with bed alarm set;with family/visitor present   Nurse Communication Mobility status        Time: 3710-6269 OT Time Calculation (min): 33 min  Charges: OT General Charges $OT Visit: 1 Visit OT Treatments $Therapeutic Activity: 8-22 mins   Matthew Folks, OTR/L [facility] [phone]

## 2020-04-17 DIAGNOSIS — R0689 Other abnormalities of breathing: Secondary | ICD-10-CM | POA: Diagnosis not present

## 2020-04-17 DIAGNOSIS — G9340 Encephalopathy, unspecified: Secondary | ICD-10-CM | POA: Diagnosis not present

## 2020-04-17 DIAGNOSIS — J9622 Acute and chronic respiratory failure with hypercapnia: Secondary | ICD-10-CM | POA: Diagnosis not present

## 2020-04-17 DIAGNOSIS — R531 Weakness: Secondary | ICD-10-CM | POA: Diagnosis not present

## 2020-04-17 LAB — GLUCOSE, CAPILLARY
Glucose-Capillary: 113 mg/dL — ABNORMAL HIGH (ref 70–99)
Glucose-Capillary: 138 mg/dL — ABNORMAL HIGH (ref 70–99)

## 2020-04-17 NOTE — Plan of Care (Signed)

## 2020-04-17 NOTE — Care Management Important Message (Signed)
Important Message  Patient Details  Name: [name] MRN: [id number] Date of Birth: January 29, 1941   Medicare Important Message Given:  Yes     [name] 04/17/2020, 11:20 AM

## 2020-04-17 NOTE — Progress Notes (Signed)
Pt is AAOx4. VSS. Denies any pain. cpap is placed on pt. All needs attended. Will continue to monitor.

## 2020-04-17 NOTE — Discharge Instructions (Signed)
Personal Items   Please collect all clothing which belongs to you from your nurse. Please collect any valuables you stored during your stay from the front desk, and please remember all of your personal items, such as dentures, canes, and eyeglasses.   Patient Discharge   [name] / [id number] DOB: [birth year]   Admitted 04/12/2020 Discharged: 04/17/2020   Activity Instructions   You must avoid lifting more than *** pounds until your physician instructs you differently. You should avoid {d/c avoid/resume:120111}. You may resume {d/c avoid/resume:120111}.   I understand that if any problems occur once I am at home I am to contact my physician.  I understand and acknowledge receipt of the instructions indicated above.    _____________________________________________                                                       Physician's or R.N.'s Signature                Date/Time                        _____________________________________________                                                       Patient or Representative Signature         Date/Time        What to do after you leave the hospital:  Recommended diet: {diet:18262}  Recommended activity: {discharge activity:18261}  Follow-up with *** {follow up:32580}.  Follow up with *** if you experience any of the following symptoms: ***  Other instructions:  ***   What to do after you leave the hospital:  Recommended diet: {diet:18262}  Recommended activity: {discharge activity:18261}  Follow-up with *** {follow up:32580}.  Follow up with *** if you experience any of the following symptoms: ***  Other instructions:  ***   I understand that if any problems occur once I am at home I am to contact my physician.  I understand and acknowledge receipt of the instructions indicated above.    _____________________________________________                                                       Physician's or R.N.'s Signature                 Date/Time                        _____________________________________________                                                       Patient or Representative Signature         Date/Time

## 2020-04-17 NOTE — Progress Notes (Addendum)
Report attempted to be called to [facility] [phone] Facility stated that will return call for report.

## 2020-04-17 NOTE — ED Notes (Signed)
Patient arrived to 3B.  In nad.  She is alert and given remote control to watch tv.

## 2020-04-17 NOTE — Discharge Summary (Signed)
Physician Discharge Summary  CHANETTA MOOSMAN GUY:[id number] DOB: Jul 24, 1940 DOA: 04/12/2020  PCP: Gracelyn Nurse, MD  Admit date: 04/12/2020 Discharge date: 04/17/2020  Admitted From: Home Disposition:  SNF  Recommendations for Outpatient Follow-up:  1. Follow up with PCP in 1-2 weeks 2. Please obtain BMP/CBC in one week 3. Make sure she is wearing her CPAP at night.  Home Health:no Equipment/Devices:None  Discharge Condition:Stable CODE STATUS:Full Diet recommendation: Heart Healthy  Brief/Interim Summary: 80 y.o. female past medical history significant for essential hypertension, chronic diastolic heart failure, non-insulin-dependent diabetes mellitus type 2, atrial fibrillation on Eliquis comes into the emergency room for acute encephalopathy, shortness of breath and fall. Significant studies: 04/12/2020 CT of the head showed no acute abnormalities mild to moderate sinus disease, mild cerebral atrophy. 04/12/2020 chest x-ray that showed mild pulmonary vascular congestion  Discharge Diagnoses:  Active Problems:   DDD (degenerative disc disease), cervical   Weakness   Obesity, Class III, BMI 40-49.9 (morbid obesity) (HCC)   Obesity, diabetes, and hypertension syndrome (HCC)   Metabolic syndrome   Shortness of breath Generalized weakness likely due to hypercarbia in the setting of not using her CPAP at night. Chest x-ray show mild pulmonary edema with a BNP greater than 100, she was given IV Lasix and it improved. She appears euvolemic physical exam lasted 24 hours. She was started on a CPAP in the hospital at night and she has been stable.  Acute encephalopathy probably due to obstructive sleep apnea/obesity hypoventilation syndrome probably due to hypercarbia: Not using her BiPAP at night she was placed on the BiPAP here in the hospital and she remained stable.  Acute gout flare: Multifacet gout x-ray of the wrist bilaterally did not show a fracture. She was started on  prednisone and she showed improvement in her pain.  Chronic respiratory failure with hypoxia due to COPD: Continue oxygen at home as an outpatient.  Essential hypertension: No change made to her medication.  Debilitated and weakness: Physical therapy evaluated the patient and recommended skilled nursing facility.  Chronic atrial fibrillation: Rate controlled metoprolol and Eliquis continue current regimen no changes made.  Non-insulin-dependent diabetes mellitus type 2: No change made to her medication continue current regimen.   Discharge Instructions  Discharge Instructions    Diet - low sodium heart healthy   Complete by: As directed    Increase activity slowly   Complete by: As directed      Allergies as of 04/17/2020      Reactions   Ciprofloxacin Hcl Itching   Rash and itching all over stomach and arms   Dopamine Nausea And Vomiting   Sulfa Antibiotics Rash   Sulfacetamide Sodium Rash   Tegaderm Ag Mesh [silver] Other (See Comments), Rash   Blisters and takes skin off      Medication List    STOP taking these medications   amoxicillin-clavulanate 875-125 MG tablet Commonly known as: AUGMENTIN     TAKE these medications   albuterol 108 (90 Base) MCG/ACT inhaler Commonly known as: VENTOLIN HFA Inhale 2 puffs into the lungs every 4 (four) hours as needed for wheezing or shortness of breath.   alendronate 70 MG tablet Commonly known as: FOSAMAX Take 70 mg by mouth every Monday.   benzonatate 200 MG capsule Commonly known as: TESSALON Take 200 mg by mouth 3 (three) times daily as needed for cough.   carvedilol 12.5 MG tablet Commonly known as: COREG Take 12.5 mg by mouth 2 (two) times daily with a meal.  Cholecalciferol 25 MCG (1000 UT) tablet Take 1,000 Units by mouth daily.   Cyanocobalamin 1000 MCG Tbcr Take 1,000 mcg by mouth daily.   diazepam 2 MG tablet Commonly known as: VALIUM Take 2 mg by mouth 2 (two) times daily.   Eliquis 5 MG Tabs  tablet Generic drug: apixaban Take 5 mg by mouth 2 (two) times daily.   furosemide 20 MG tablet Commonly known as: LASIX Take 10 mg by mouth daily.   gabapentin 100 MG capsule Commonly known as: NEURONTIN Take 200 mg by mouth 3 (three) times daily.   metFORMIN 500 MG tablet Commonly known as: GLUCOPHAGE Take 500 mg by mouth 2 (two) times daily with a meal.   multivitamin with minerals tablet Take 1 tablet by mouth daily.   omeprazole 20 MG capsule Commonly known as: PRILOSEC Take 20 mg by mouth daily.   pioglitazone 15 MG tablet Commonly known as: ACTOS Take 15 mg by mouth daily.   predniSONE 20 MG tablet Commonly known as: DELTASONE Take 2 tablets (40 mg total) by mouth daily with breakfast for 5 days.   quinapril 20 MG tablet Commonly known as: ACCUPRIL Take 10-20 mg by mouth See admin instructions. Take 20 mg in the morning, and 10 mg in the evening   rosuvastatin 10 MG tablet Commonly known as: CRESTOR Take 10 mg by mouth daily.   sertraline 100 MG tablet Commonly known as: ZOLOFT Take 100 mg by mouth daily.       Contact information for after-discharge care    Destination    [facility] .   Service: Skilled Nursing Contact information: [address] [place] [address] [phone]                 Allergies  Allergen Reactions  . Ciprofloxacin Hcl Itching    Rash and itching all over stomach and arms  . Dopamine Nausea And Vomiting  . Sulfa Antibiotics Rash  . Sulfacetamide Sodium Rash  . Tegaderm Ag Mesh [Silver] Other (See Comments) and Rash    Blisters and takes skin off    Consultations:  None   Procedures/Studies: DG Chest 2 View  Result Date: 04/12/2020 CLINICAL DATA:  Weakness EXAM: CHEST - 2 VIEW COMPARISON:  None. FINDINGS: The heart size and mediastinal contours are mildly enlarged. Aortic knob calcifications are seen. There is prominence of the central pulmonary  vasculature. Calcified granuloma is noted within the right lung base. A left-sided pacemaker seen with the lead tips in the right atrium right ventricle. The visualized skeletal structures are unremarkable. Cervical fixation is noted. IMPRESSION: Mild pulmonary vascular congestion. Electronically Signed   By: Jonna Clark M.D.   On: 04/12/2020 16:34   DG Wrist 2 Views Left  Result Date: 04/16/2020 CLINICAL DATA:  Pain following fall EXAM: LEFT WRIST - 2 VIEW COMPARISON:  Left hand radiographs January 20, 2013 FINDINGS: Frontal and lateral views were obtained. There is no fracture or dislocation. There is narrowing of the scaphotrapezial joint. There is calcification volar to the proximal carpal row as well as calcification in the triangular fibrocartilage region. IMPRESSION: No fracture or dislocation. Areas of calcification likely of arthropathic etiology. Prior tear of the triangular fibrocartilage could also present with calcification. There is narrowing of the scaphotrapezial joint. Electronically Signed   By: Bretta Bang III M.D.   On: 04/16/2020 10:19   DG Wrist Complete Right  Result Date: 04/12/2020 CLINICAL DATA:  Right wrist pain after fall. EXAM: RIGHT WRIST -  COMPLETE 3+ VIEW COMPARISON:  None. FINDINGS: There is no evidence of fracture or dislocation. There is no evidence of arthropathy or other focal bone abnormality. Osteopenia. Soft tissues are unremarkable. IMPRESSION: Negative. Electronically Signed   By: Obie Dredge M.D.   On: 04/12/2020 14:02   CT Head Wo Contrast  Result Date: 04/12/2020 CLINICAL DATA:  Status post fall. EXAM: CT HEAD WITHOUT CONTRAST TECHNIQUE: Contiguous axial images were obtained from the base of the skull through the vertex without intravenous contrast. COMPARISON:  October 25, 2019 FINDINGS: Brain: There is mild cerebral atrophy with widening of the extra-axial spaces and ventricular dilatation. There are areas of decreased attenuation within the  white matter tracts of the supratentorial brain, consistent with microvascular disease changes. Vascular: No hyperdense vessel or unexpected calcification. Skull: Normal. Negative for fracture or focal lesion. Sinuses/Orbits: There is mild sphenoid sinus and mild left maxillary sinus mucosal thickening. Moderate severity bilateral ethmoid sinus mucosal thickening is seen. Other: None. IMPRESSION: 1. Mild cerebral atrophy. 2. No acute intracranial abnormality. 3. Mild to moderate severity paranasal sinus disease. Electronically Signed   By: Aram Candela M.D.   On: 04/12/2020 16:04   (Echo, Carotid, EGD, Colonoscopy, ERCP)    Subjective: Relates her wrist pain is improved.  Discharge Exam: Vitals:   04/17/20 0400 04/17/20 0832  BP: (!) 127/58 (!) 145/62  Pulse: 70 70  Resp: 18 16  Temp: 98.6 F (37 C) (!) 97.5 F (36.4 C)  SpO2: 96% 98%   Vitals:   04/16/20 1946 04/17/20 0030 04/17/20 0400 04/17/20 0832  BP: (!) 125/50  (!) 127/58 (!) 145/62  Pulse: 70 68 70 70  Resp: Temp: 98.6 F (37 C)  98.6 F (37 C) (!) 97.5 F (36.4 C)  TempSrc:    Oral  SpO2: 94% 96% 96% 98%  Weight:   117 kg   Height:        General: Pt is alert, awake, not in acute distress Cardiovascular: RRR, S1/S2 +, no rubs, no gallops Respiratory: CTA bilaterally, no wheezing, no rhonchi Abdominal: Soft, NT, ND, bowel sounds + Extremities: no edema, no cyanosis    The results of significant diagnostics from this hospitalization (including imaging, microbiology, ancillary and laboratory) are listed below for reference.     Microbiology: Recent Results (from the past 240 hour(s))  SARS CORONAVIRUS 2 (TAT 6-24 HRS) Nasopharyngeal Nasopharyngeal Swab     Status: None   Collection Time: 04/12/20  5:30 PM   Specimen: Nasopharyngeal Swab  Result Value Ref Range Status   SARS Coronavirus 2 NEGATIVE NEGATIVE Final    Comment: (NOTE) SARS-CoV-2 target nucleic acids are NOT DETECTED.  The  SARS-CoV-2 RNA is generally detectable in upper and lower respiratory specimens during the acute phase of infection. Negative results do not preclude SARS-CoV-2 infection, do not rule out co-infections with other pathogens, and should not be used as the sole basis for treatment or other patient management decisions. Negative results must be combined with clinical observations, patient history, and epidemiological information. The expected result is Negative.  Fact Sheet for Patients: HairSlick.no  Fact Sheet for Healthcare Providers: quierodirigir.com  This test is not yet approved or cleared by the [place] FDA and  has been authorized for detection and/or diagnosis of SARS-CoV-2 by FDA under an Emergency Use Authorization (EUA). This EUA will remain  in effect (meaning this test can be used) for the duration of the COVID-19 declaration under Se ction 564(b)(1) of the Act,  21 U.S.C. section 360bbb-3(b)(1), unless the authorization is terminated or revoked sooner.  Performed at [facility], [address], [place], [place] [address]   Resp Panel by RT-PCR (Flu A&B, Covid) Nasopharyngeal Swab     Status: None   Collection Time: 04/16/20  9:53 AM   Specimen: Nasopharyngeal Swab; Nasopharyngeal(NP) swabs in vial transport medium  Result Value Ref Range Status   SARS Coronavirus 2 by RT PCR NEGATIVE NEGATIVE Final    Comment: (NOTE) SARS-CoV-2 target nucleic acids are NOT DETECTED.  The SARS-CoV-2 RNA is generally detectable in upper respiratory specimens during the acute phase of infection. The lowest concentration of SARS-CoV-2 viral copies this assay can detect is 138 copies/mL. A negative result does not preclude SARS-Cov-2 infection and should not be used as the sole basis for treatment or other patient management decisions. A negative result may occur with  improper specimen collection/handling, submission of  specimen other than nasopharyngeal swab, presence of viral mutation(s) within the areas targeted by this assay, and inadequate number of viral copies(<138 copies/mL). A negative result must be combined with clinical observations, patient history, and epidemiological information. The expected result is Negative.  Fact Sheet for Patients:  BloggerCourse.com  Fact Sheet for Healthcare Providers:  SeriousBroker.it  This test is no t yet approved or cleared by the [place] FDA and  has been authorized for detection and/or diagnosis of SARS-CoV-2 by FDA under an Emergency Use Authorization (EUA). This EUA will remain  in effect (meaning this test can be used) for the duration of the COVID-19 declaration under Section 564(b)(1) of the Act, 21 U.S.C.section 360bbb-3(b)(1), unless the authorization is terminated  or revoked sooner.       Influenza A by PCR NEGATIVE NEGATIVE Final   Influenza B by PCR NEGATIVE NEGATIVE Final    Comment: (NOTE) The Xpert Xpress SARS-CoV-2/FLU/RSV plus assay is intended as an aid in the diagnosis of influenza from Nasopharyngeal swab specimens and should not be used as a sole basis for treatment. Nasal washings and aspirates are unacceptable for Xpert Xpress SARS-CoV-2/FLU/RSV testing.  Fact Sheet for Patients: BloggerCourse.com  Fact Sheet for Healthcare Providers: SeriousBroker.it  This test is not yet approved or cleared by the [place] FDA and has been authorized for detection and/or diagnosis of SARS-CoV-2 by FDA under an Emergency Use Authorization (EUA). This EUA will remain in effect (meaning this test can be used) for the duration of the COVID-19 declaration under Section 564(b)(1) of the Act, 21 U.S.C. section 360bbb-3(b)(1), unless the authorization is terminated or revoked.  Performed at [facility], [address] [place], [place] [address]      Labs: BNP (last 3 results) Recent Labs    10/25/19 0320 04/12/20 2303  BNP 186.5* 111.8*   Basic Metabolic Panel: Recent Labs  Lab 04/12/20 1312 04/13/20 0426 04/14/20 0447 04/15/20 0400 04/16/20 0503  NA 143 143 141 139 141  K 3.4* 3.6 3.4* 3.9 4.2  CL 98 98 95* 93* 96*  CO2 35* 37* 38* 37* 37*  GLUCOSE 155* 167* 145* 129* 116*  BUN 25* 18 14 16  24*  CREATININE 1.07* 0.66 0.60 0.57 0.66  CALCIUM 8.9 8.7* 8.8* 8.4* 7.9*  MG  --  1.6*  --   --   --    Liver Function Tests: Recent Labs  Lab 04/12/20 1312  AST 13*  ALT 9  ALKPHOS 55  BILITOT 0.6  PROT 6.8  ALBUMIN 3.0*   No results for input(s):  LIPASE, AMYLASE in the last 168 hours. Recent Labs  Lab 04/12/20 1745  AMMONIA 18   CBC: Recent Labs  Lab 04/12/20 1312 04/13/20 0426  WBC 11.5* 9.9  HGB 9.9* 9.7*  HCT 34.1* 32.0*  MCV 99.1 97.3  PLT 194 195   Cardiac Enzymes: No results for input(s): CKTOTAL, CKMB, CKMBINDEX, TROPONINI in the last 168 hours. BNP: Invalid input(s): POCBNP CBG: Recent Labs  Lab 04/16/20 0731 04/16/20 1117 04/16/20 1639 04/16/20 1946 04/17/20 0833  GLUCAP 129* 155* 170* 202* 113*   D-Dimer No results for input(s): DDIMER in the last 72 hours. Hgb A1c No results for input(s): HGBA1C in the last 72 hours. Lipid Profile No results for input(s): CHOL, HDL, LDLCALC, TRIG, CHOLHDL, LDLDIRECT in the last 72 hours. Thyroid function studies No results for input(s): TSH, T4TOTAL, T3FREE, THYROIDAB in the last 72 hours.  Invalid input(s): FREET3 Anemia work up No results for input(s): VITAMINB12, FOLATE, FERRITIN, TIBC, IRON, RETICCTPCT in the last 72 hours. Urinalysis    Component Value Date/Time   COLORURINE Yellow 12/07/2012 1209   APPEARANCEUR Cloudy (A) 02/17/2018 1247   LABSPEC 1.012 12/07/2012 1209   PHURINE 6.0 12/07/2012 1209   GLUCOSEU Negative 02/17/2018 1247   GLUCOSEU Negative 12/07/2012 1209   HGBUR 1+ 12/07/2012 1209    BILIRUBINUR Negative 02/17/2018 1247   BILIRUBINUR Negative 12/07/2012 1209   KETONESUR Negative 12/07/2012 1209   PROTEINUR 1+ (A) 02/17/2018 1247   PROTEINUR Negative 12/07/2012 1209   NITRITE Negative 02/17/2018 1247   NITRITE Positive 12/07/2012 1209   LEUKOCYTESUR 1+ (A) 02/17/2018 1247   LEUKOCYTESUR 1+ 12/07/2012 1209   Sepsis Labs Invalid input(s): PROCALCITONIN,  WBC,  LACTICIDVEN Microbiology Recent Results (from the past 240 hour(s))  SARS CORONAVIRUS 2 (TAT 6-24 HRS) Nasopharyngeal Nasopharyngeal Swab     Status: None   Collection Time: 04/12/20  5:30 PM   Specimen: Nasopharyngeal Swab  Result Value Ref Range Status   SARS Coronavirus 2 NEGATIVE NEGATIVE Final    Comment: (NOTE) SARS-CoV-2 target nucleic acids are NOT DETECTED.  The SARS-CoV-2 RNA is generally detectable in upper and lower respiratory specimens during the acute phase of infection. Negative results do not preclude SARS-CoV-2 infection, do not rule out co-infections with other pathogens, and should not be used as the sole basis for treatment or other patient management decisions. Negative results must be combined with clinical observations, patient history, and epidemiological information. The expected result is Negative.  Fact Sheet for Patients: HairSlick.no  Fact Sheet for Healthcare Providers: quierodirigir.com  This test is not yet approved or cleared by the [place] FDA and  has been authorized for detection and/or diagnosis of SARS-CoV-2 by FDA under an Emergency Use Authorization (EUA). This EUA will remain  in effect (meaning this test can be used) for the duration of the COVID-19 declaration under Se ction 564(b)(1) of the Act, 21 U.S.C. section 360bbb-3(b)(1), unless the authorization is terminated or revoked sooner.  Performed at [facility], [address], [place], [place] [address]   Resp Panel by RT-PCR (Flu  A&B, Covid) Nasopharyngeal Swab     Status: None   Collection Time: 04/16/20  9:53 AM   Specimen: Nasopharyngeal Swab; Nasopharyngeal(NP) swabs in vial transport medium  Result Value Ref Range Status   SARS Coronavirus 2 by RT PCR NEGATIVE NEGATIVE Final    Comment: (NOTE) SARS-CoV-2 target nucleic acids are NOT DETECTED.  The SARS-CoV-2 RNA is generally detectable in upper respiratory specimens during the acute phase of  infection. The lowest concentration of SARS-CoV-2 viral copies this assay can detect is 138 copies/mL. A negative result does not preclude SARS-Cov-2 infection and should not be used as the sole basis for treatment or other patient management decisions. A negative result may occur with  improper specimen collection/handling, submission of specimen other than nasopharyngeal swab, presence of viral mutation(s) within the areas targeted by this assay, and inadequate number of viral copies(<138 copies/mL). A negative result must be combined with clinical observations, patient history, and epidemiological information. The expected result is Negative.  Fact Sheet for Patients:  BloggerCourse.comhttps://www.fda.gov/media/152166/download  Fact Sheet for Healthcare Providers:  SeriousBroker.ithttps://www.fda.gov/media/152162/download  This test is no t yet approved or cleared by the [place] FDA and  has been authorized for detection and/or diagnosis of SARS-CoV-2 by FDA under an Emergency Use Authorization (EUA). This EUA will remain  in effect (meaning this test can be used) for the duration of the COVID-19 declaration under Section 564(b)(1) of the Act, 21 U.S.C.section 360bbb-3(b)(1), unless the authorization is terminated  or revoked sooner.       Influenza A by PCR NEGATIVE NEGATIVE Final   Influenza B by PCR NEGATIVE NEGATIVE Final    Comment: (NOTE) The Xpert Xpress SARS-CoV-2/FLU/RSV plus assay is intended as an aid in the diagnosis of influenza from Nasopharyngeal swab specimens  and should not be used as a sole basis for treatment. Nasal washings and aspirates are unacceptable for Xpert Xpress SARS-CoV-2/FLU/RSV testing.  Fact Sheet for Patients: BloggerCourse.comhttps://www.fda.gov/media/152166/download  Fact Sheet for Healthcare Providers: SeriousBroker.ithttps://www.fda.gov/media/152162/download  This test is not yet approved or cleared by the [place] FDA and has been authorized for detection and/or diagnosis of SARS-CoV-2 by FDA under an Emergency Use Authorization (EUA). This EUA will remain in effect (meaning this test can be used) for the duration of the COVID-19 declaration under Section 564(b)(1) of the Act, 21 U.S.C. section 360bbb-3(b)(1), unless the authorization is terminated or revoked.  Performed at [facility], [address] [place], [place] [address]      Time coordinating discharge: Over 30 minutes  SIGNED:   Marinda ElkAbraham Feliz Ortiz, MD  [facility] 04/17/2020, 9:45 AM Pager   If 7PM-7AM, please contact night-coverage [email or web address] Password [email or web address]

## 2020-04-17 NOTE — Progress Notes (Signed)
[facility] at bedside. 

## 2020-04-17 NOTE — ED Notes (Signed)
Spoke to patient's son Fayrene Fearing.  He is upset because patient is going to [place].  I called 2 a so care management can review to see why she is going to [place] instead of [place].  The patient is in bed in nad and sleeping.  She has oxygen on at  2 liters [place].

## 2020-04-17 NOTE — TOC Progression Note (Signed)
Transition of Care [facility]) - Progression Note    Patient Details  Name: IKEA DEMICCO MRN: [id number] Date of Birth: Dec 25, 1940  Transition of [facility]) CM/SW Contact  Hetty Ely, RN Phone Number: 04/17/2020, 10:16 AM  Clinical Narrative: Patient to be discharged today via [facility] transport to [facility] in [place] to room 3247. TOC barriers resolved.      Expected Discharge Plan: Skilled Nursing Facility Barriers to Discharge: Barriers Resolved  Expected Discharge Plan and Services Expected Discharge Plan: Skilled Nursing Facility   Discharge Planning Services: CM Consult Post Acute Care Choice: Skilled Nursing Facility Living arrangements for the past 2 months: Apartment Expected Discharge Date: 04/16/20               DME Arranged: N/A DME Agency: NA       HH Arranged: NA HH Agency: NA         Social Determinants of Health (SDOH) Interventions    Readmission Risk Interventions No flowsheet data found.

## 2020-04-17 NOTE — ED Notes (Signed)
Transport here.  Patient is dry and has pad under her.  purewick removed.

## 2020-07-11 ENCOUNTER — Encounter (HOSPITAL_COMMUNITY): Payer: Self-pay | Admitting: *Deleted

## 2020-07-11 ENCOUNTER — Inpatient Hospital Stay (HOSPITAL_COMMUNITY)
Admission: EM | Admit: 2020-07-11 | Discharge: 2020-07-13 | DRG: 683 | Disposition: A | Discharge status: Skilled Nursing Facility | Payer: Medicare Other | Source: Skilled Nursing Facility | Attending: Family Medicine | Admitting: Family Medicine

## 2020-07-11 ENCOUNTER — Emergency Department (HOSPITAL_COMMUNITY): Payer: Medicare Other

## 2020-07-11 ENCOUNTER — Other Ambulatory Visit: Payer: Self-pay

## 2020-07-11 DIAGNOSIS — Z882 Allergy status to sulfonamides status: Secondary | ICD-10-CM

## 2020-07-11 DIAGNOSIS — E119 Type 2 diabetes mellitus without complications: Secondary | ICD-10-CM

## 2020-07-11 DIAGNOSIS — Z87891 Personal history of nicotine dependence: Secondary | ICD-10-CM

## 2020-07-11 DIAGNOSIS — Z8711 Personal history of peptic ulcer disease: Secondary | ICD-10-CM

## 2020-07-11 DIAGNOSIS — Z9981 Dependence on supplemental oxygen: Secondary | ICD-10-CM

## 2020-07-11 DIAGNOSIS — M1990 Unspecified osteoarthritis, unspecified site: Secondary | ICD-10-CM | POA: Diagnosis present

## 2020-07-11 DIAGNOSIS — Z79899 Other long term (current) drug therapy: Secondary | ICD-10-CM

## 2020-07-11 DIAGNOSIS — J9611 Chronic respiratory failure with hypoxia: Secondary | ICD-10-CM | POA: Diagnosis present

## 2020-07-11 DIAGNOSIS — E785 Hyperlipidemia, unspecified: Secondary | ICD-10-CM | POA: Diagnosis present

## 2020-07-11 DIAGNOSIS — G4730 Sleep apnea, unspecified: Secondary | ICD-10-CM | POA: Diagnosis present

## 2020-07-11 DIAGNOSIS — Z7984 Long term (current) use of oral hypoglycemic drugs: Secondary | ICD-10-CM

## 2020-07-11 DIAGNOSIS — I110 Hypertensive heart disease with heart failure: Secondary | ICD-10-CM | POA: Diagnosis present

## 2020-07-11 DIAGNOSIS — F32A Depression, unspecified: Secondary | ICD-10-CM | POA: Diagnosis present

## 2020-07-11 DIAGNOSIS — Z90722 Acquired absence of ovaries, bilateral: Secondary | ICD-10-CM

## 2020-07-11 DIAGNOSIS — Z9049 Acquired absence of other specified parts of digestive tract: Secondary | ICD-10-CM

## 2020-07-11 DIAGNOSIS — Z20822 Contact with and (suspected) exposure to covid-19: Secondary | ICD-10-CM | POA: Diagnosis present

## 2020-07-11 DIAGNOSIS — R778 Other specified abnormalities of plasma proteins: Secondary | ICD-10-CM

## 2020-07-11 DIAGNOSIS — R7989 Other specified abnormal findings of blood chemistry: Secondary | ICD-10-CM | POA: Diagnosis present

## 2020-07-11 DIAGNOSIS — Z91048 Other nonmedicinal substance allergy status: Secondary | ICD-10-CM

## 2020-07-11 DIAGNOSIS — Z66 Do not resuscitate: Secondary | ICD-10-CM | POA: Diagnosis present

## 2020-07-11 DIAGNOSIS — N179 Acute kidney failure, unspecified: Principal | ICD-10-CM | POA: Diagnosis present

## 2020-07-11 DIAGNOSIS — E860 Dehydration: Secondary | ICD-10-CM | POA: Diagnosis present

## 2020-07-11 DIAGNOSIS — Z7901 Long term (current) use of anticoagulants: Secondary | ICD-10-CM

## 2020-07-11 DIAGNOSIS — I5032 Chronic diastolic (congestive) heart failure: Secondary | ICD-10-CM | POA: Diagnosis present

## 2020-07-11 DIAGNOSIS — I248 Other forms of acute ischemic heart disease: Secondary | ICD-10-CM | POA: Diagnosis present

## 2020-07-11 DIAGNOSIS — Z95810 Presence of automatic (implantable) cardiac defibrillator: Secondary | ICD-10-CM

## 2020-07-11 DIAGNOSIS — Z8673 Personal history of transient ischemic attack (TIA), and cerebral infarction without residual deficits: Secondary | ICD-10-CM

## 2020-07-11 DIAGNOSIS — I959 Hypotension, unspecified: Secondary | ICD-10-CM | POA: Diagnosis present

## 2020-07-11 DIAGNOSIS — Z96653 Presence of artificial knee joint, bilateral: Secondary | ICD-10-CM | POA: Diagnosis present

## 2020-07-11 DIAGNOSIS — N39498 Other specified urinary incontinence: Secondary | ICD-10-CM | POA: Diagnosis present

## 2020-07-11 DIAGNOSIS — I482 Chronic atrial fibrillation, unspecified: Secondary | ICD-10-CM | POA: Diagnosis present

## 2020-07-11 DIAGNOSIS — G4733 Obstructive sleep apnea (adult) (pediatric): Secondary | ICD-10-CM | POA: Diagnosis present

## 2020-07-11 DIAGNOSIS — E1169 Type 2 diabetes mellitus with other specified complication: Secondary | ICD-10-CM | POA: Diagnosis present

## 2020-07-11 DIAGNOSIS — Z981 Arthrodesis status: Secondary | ICD-10-CM

## 2020-07-11 DIAGNOSIS — M797 Fibromyalgia: Secondary | ICD-10-CM | POA: Diagnosis present

## 2020-07-11 DIAGNOSIS — F419 Anxiety disorder, unspecified: Secondary | ICD-10-CM | POA: Diagnosis present

## 2020-07-11 DIAGNOSIS — R531 Weakness: Secondary | ICD-10-CM

## 2020-07-11 DIAGNOSIS — E1159 Type 2 diabetes mellitus with other circulatory complications: Secondary | ICD-10-CM | POA: Diagnosis present

## 2020-07-11 DIAGNOSIS — Z6841 Body Mass Index (BMI) 40.0 and over, adult: Secondary | ICD-10-CM

## 2020-07-11 DIAGNOSIS — Z881 Allergy status to other antibiotic agents status: Secondary | ICD-10-CM

## 2020-07-11 DIAGNOSIS — I152 Hypertension secondary to endocrine disorders: Secondary | ICD-10-CM | POA: Diagnosis present

## 2020-07-11 DIAGNOSIS — Z888 Allergy status to other drugs, medicaments and biological substances status: Secondary | ICD-10-CM

## 2020-07-11 DIAGNOSIS — R7401 Elevation of levels of liver transaminase levels: Secondary | ICD-10-CM

## 2020-07-11 DIAGNOSIS — Z7983 Long term (current) use of bisphosphonates: Secondary | ICD-10-CM

## 2020-07-11 LAB — CBC WITH DIFFERENTIAL/PLATELET
Abs Immature Granulocytes: 0.06 10*3/uL (ref 0.00–0.07)
Basophils Absolute: 0 10*3/uL (ref 0.0–0.1)
Basophils Relative: 0 %
Eosinophils Absolute: 0 10*3/uL (ref 0.0–0.5)
Eosinophils Relative: 0 %
HCT: 29 % — ABNORMAL LOW (ref 36.0–46.0)
Hemoglobin: 8.6 g/dL — ABNORMAL LOW (ref 12.0–15.0)
Immature Granulocytes: 1 %
Lymphocytes Relative: 16 %
Lymphs Abs: 1.5 10*3/uL (ref 0.7–4.0)
MCH: 29.3 pg (ref 26.0–34.0)
MCHC: 29.7 g/dL — ABNORMAL LOW (ref 30.0–36.0)
MCV: 98.6 fL (ref 80.0–100.0)
Monocytes Absolute: 0.9 10*3/uL (ref 0.1–1.0)
Monocytes Relative: 10 %
Neutro Abs: 6.7 10*3/uL (ref 1.7–7.7)
Neutrophils Relative %: 73 %
Platelets: 177 10*3/uL (ref 150–400)
RBC: 2.94 MIL/uL — ABNORMAL LOW (ref 3.87–5.11)
RDW: 14.6 % (ref 11.5–15.5)
WBC: 9.1 10*3/uL (ref 4.0–10.5)
nRBC: 0 % (ref 0.0–0.2)

## 2020-07-11 LAB — COMPREHENSIVE METABOLIC PANEL
ALT: 272 U/L — ABNORMAL HIGH (ref 0–44)
AST: 416 U/L — ABNORMAL HIGH (ref 15–41)
Albumin: 3.1 g/dL — ABNORMAL LOW (ref 3.5–5.0)
Alkaline Phosphatase: 47 U/L (ref 38–126)
Anion gap: 9 (ref 5–15)
BUN: 15 mg/dL (ref 8–23)
CO2: 34 mmol/L — ABNORMAL HIGH (ref 22–32)
Calcium: 8.5 mg/dL — ABNORMAL LOW (ref 8.9–10.3)
Chloride: 98 mmol/L (ref 98–111)
Creatinine, Ser: 1.86 mg/dL — ABNORMAL HIGH (ref 0.44–1.00)
GFR, Estimated: 28 mL/min — ABNORMAL LOW (ref >60)
Glucose, Bld: 143 mg/dL — ABNORMAL HIGH (ref 70–99)
Potassium: 3.9 mmol/L (ref 3.5–5.1)
Sodium: 141 mmol/L (ref 135–145)
Total Bilirubin: 0.8 mg/dL (ref 0.3–1.2)
Total Protein: 6.1 g/dL — ABNORMAL LOW (ref 6.5–8.1)

## 2020-07-11 LAB — LACTIC ACID, PLASMA: Lactic Acid, Venous: 1.9 mmol/L (ref 0.5–1.9)

## 2020-07-11 LAB — GLUCOSE, CAPILLARY: Glucose-Capillary: 104 mg/dL — ABNORMAL HIGH (ref 70–99)

## 2020-07-11 LAB — I-STAT VENOUS BLOOD GAS, ED
Acid-Base Excess: 10 mmol/L — ABNORMAL HIGH (ref 0.0–2.0)
Bicarbonate: 36.6 mmol/L — ABNORMAL HIGH (ref 20.0–28.0)
Calcium, Ion: 1.1 mmol/L — ABNORMAL LOW (ref 1.15–1.40)
HCT: 27 % — ABNORMAL LOW (ref 36.0–46.0)
Hemoglobin: 9.2 g/dL — ABNORMAL LOW (ref 12.0–15.0)
O2 Saturation: 91 %
Potassium: 3.9 mmol/L (ref 3.5–5.1)
Sodium: 141 mmol/L (ref 135–145)
TCO2: 38 mmol/L — ABNORMAL HIGH (ref 22–32)
pCO2, Ven: 58.6 mmHg (ref 44.0–60.0)
pH, Ven: 7.404 (ref 7.250–7.430)
pO2, Ven: 64 mmHg — ABNORMAL HIGH (ref 32.0–45.0)

## 2020-07-11 LAB — RESP PANEL BY RT-PCR (FLU A&B, COVID) ARPGX2
Influenza A by PCR: NEGATIVE
Influenza B by PCR: NEGATIVE
SARS Coronavirus 2 by RT PCR: NEGATIVE

## 2020-07-11 LAB — AMMONIA: Ammonia: 39 umol/L — ABNORMAL HIGH (ref 9–35)

## 2020-07-11 LAB — PROTIME-INR
INR: 1.8 — ABNORMAL HIGH (ref 0.8–1.2)
Prothrombin Time: 20.8 seconds — ABNORMAL HIGH (ref 11.4–15.2)

## 2020-07-11 LAB — BRAIN NATRIURETIC PEPTIDE: B Natriuretic Peptide: 490.7 pg/mL — ABNORMAL HIGH (ref 0.0–100.0)

## 2020-07-11 LAB — TROPONIN I (HIGH SENSITIVITY)
Troponin I (High Sensitivity): 764 ng/L (ref <18)
Troponin I (High Sensitivity): 887 ng/L (ref <18)

## 2020-07-11 LAB — APTT: aPTT: 52 seconds — ABNORMAL HIGH (ref 24–36)

## 2020-07-11 MED ORDER — APIXABAN 5 MG PO TABS
5.0000 mg | ORAL_TABLET | Freq: Two times a day (BID) | ORAL | Status: DC
  Administered 2020-07-11 – 2020-07-13 (×4): 5 mg via ORAL
  Filled 2020-07-11 (×4): qty 1

## 2020-07-11 MED ORDER — SODIUM CHLORIDE 0.9 % IV BOLUS
1000.0000 mL | Freq: Once | INTRAVENOUS | Status: AC
  Administered 2020-07-11: 1000 mL via INTRAVENOUS

## 2020-07-11 MED ORDER — DIAZEPAM 2 MG PO TABS
2.0000 mg | ORAL_TABLET | Freq: Two times a day (BID) | ORAL | Status: DC
  Administered 2020-07-11 – 2020-07-13 (×4): 2 mg via ORAL
  Filled 2020-07-11 (×4): qty 1

## 2020-07-11 MED ORDER — INSULIN ASPART 100 UNIT/ML IJ SOLN
0.0000 [IU] | Freq: Three times a day (TID) | INTRAMUSCULAR | Status: DC
  Administered 2020-07-12 – 2020-07-13 (×2): 1 [IU] via SUBCUTANEOUS

## 2020-07-11 MED ORDER — ALBUTEROL SULFATE HFA 108 (90 BASE) MCG/ACT IN AERS: 2.0000 | INHALATION_SPRAY | RESPIRATORY_TRACT | Status: DC | PRN

## 2020-07-11 MED ORDER — ONDANSETRON HCL 4 MG PO TABS: 4.0000 mg | ORAL_TABLET | Freq: Four times a day (QID) | ORAL | Status: DC | PRN

## 2020-07-11 MED ORDER — GABAPENTIN 100 MG PO CAPS
200.0000 mg | ORAL_CAPSULE | Freq: Three times a day (TID) | ORAL | Status: DC
  Administered 2020-07-11 – 2020-07-13 (×6): 200 mg via ORAL
  Filled 2020-07-11 (×6): qty 2

## 2020-07-11 MED ORDER — SODIUM CHLORIDE 0.9 % IV SOLN: INTRAVENOUS | Status: AC

## 2020-07-11 MED ORDER — SODIUM CHLORIDE 0.9% FLUSH
3.0000 mL | Freq: Two times a day (BID) | INTRAVENOUS | Status: DC
  Administered 2020-07-12 – 2020-07-13 (×3): 3 mL via INTRAVENOUS

## 2020-07-11 MED ORDER — ONDANSETRON HCL 4 MG/2ML IJ SOLN: 4.0000 mg | Freq: Four times a day (QID) | INTRAMUSCULAR | Status: DC | PRN

## 2020-07-11 MED ORDER — CARVEDILOL 12.5 MG PO TABS
12.5000 mg | ORAL_TABLET | Freq: Two times a day (BID) | ORAL | Status: DC
  Administered 2020-07-12 – 2020-07-13 (×4): 12.5 mg via ORAL
  Filled 2020-07-11 (×4): qty 1

## 2020-07-11 NOTE — ED Notes (Signed)
Got patient undressed on the monitor did ekg shown to er doctor

## 2020-07-11 NOTE — ED Triage Notes (Addendum)
Pt here from [facility] for weakness and fever, today @ 1230.  Staff feels pt is weaker on R than L, but pt states that is her norm.  Alert and oriented per norm.  Denies chest pain, nausea, cough, etc.  Pt normally on 2L [place] and for "sats of 76% per staff".  When [facility] arrived pt had sats of 91% on 2L.  Given 1000 mg tylenol and 300 ns en-route.  VS  120/52 Hr 70 paced cbg 149 Temp 102.2

## 2020-07-11 NOTE — ED Provider Notes (Signed)
Physical Exam  BP (!) 123/57 (BP Location: Right Wrist)   Pulse 71   Temp 98.5 F (36.9 C) (Oral)   Resp 16   SpO2 91%   Physical Exam Vitals and nursing note reviewed.  Constitutional:      General: She is not in acute distress.    Appearance: She is well-developed. She is not diaphoretic.  HENT:     Head: Normocephalic and atraumatic.  Eyes:     General: No scleral icterus.    Conjunctiva/sclera: Conjunctivae normal.  Pulmonary:     Effort: Pulmonary effort is normal. No respiratory distress.  Abdominal:     Palpations: Abdomen is soft.     Tenderness: There is no abdominal tenderness.  Musculoskeletal:     Cervical back: Normal range of motion.  Skin:    Findings: No rash.  Neurological:     Mental Status: She is alert.     ED Course/Procedures   Clinical Course as of 07/11/20 1835  Wed Jul 11, 2020  1433 Joetta Manners - no answer [BM]  6440 [phone] - no answer [BM]  1434 [phone] - straight to voicemail [BM]  1434 [phone] - [name] (reports is patients POA) [BM]  1639 CT ABDOMEN PELVIS WO CONTRAST Diverticulosis without evidence of diverticulitis [HK]  1639 CT Head Wo Contrast No acute abnormality [HK]  1639 DG Chest Port 1 View Vascular congestion without any other abnormal findings [HK]  1639 WBC: 9.1 [HK]  1639 Hemoglobin(!): 8.6 [HK]  1645 Lactic Acid, Venous: 1.9 [HK]  1645 Ammonia(!): 39 [HK]  1646 Troponin I (High Sensitivity)(!!): 764 [HK]    Clinical Course User Index [BM] Harlene Salts A, PA-C [HK] Tira, [place], PA-C    .Critical Care Performed by: Dietrich Pates, PA-C Authorized by: Dietrich Pates, PA-C   Critical care provider statement:    Critical care time (minutes):  35   Critical care was necessary to treat or prevent imminent or life-threatening deterioration of the following conditions:  Circulatory failure, shock and cardiac failure   Critical care was time spent personally by me on the following activities:   Development of treatment plan with patient or surrogate, discussions with consultants, obtaining history from patient or surrogate, examination of patient, evaluation of patient's response to treatment, ordering and performing treatments and interventions, ordering and review of laboratory studies, re-evaluation of patient's condition, review of old charts and pulse oximetry   I assumed direction of critical care for this patient from another provider in my specialty: no      MDM   Care of patient assumed from PA [facility] at 3:30 PM.  Agree with history, physical exam and plan.  See their note for further details.  Briefly, 80 y.o. female with PMH/PSH as below who presents with diarrhea for 1 week, generalized weakness.  Wear supplemental oxygen at baseline.  No complaints of chest pain.  Patient resides at a nursing home.  Also concern for possible fever. Patient afebrile here.  Blood pressure within normal limits.  Did not meet criteria for code sepsis during initial evaluation. EKG without any changes from priors  Past Medical History:  Diagnosis Date  . AICD (automatic cardioverter/defibrillator) present    PLACED BY  DR  Graciela Husbands 2008  . Angina    5-6 YRS AGO  . Anxiety   . Arthritis   . Atrial fibrillation (HCC)   . CHF (congestive heart failure) (HCC)   . Complication of anesthesia    states at times had a tough time waking  up  . Diabetes mellitus   . Diverticulum of esophagus    large diverticulum in the middle third of the esophagus [facility] EGD '09)  . Dysrhythmia    A-FIB  . Fibromyalgia   . Headache(784.0)   . Heartburn   . History of stomach ulcers   . History of transient ischemic attack (TIA)    LAST ONE IN  2005  IN  [place], [place]  . HLD (hyperlipidemia)   . Hypertension   . Shortness of breath    EASING OFF NOW  . Sleep apnea    DOESN'T KNOW SETTINGS  . Stroke (cerebrum) (HCC)   . TIA (transient ischemic attack)    Past Surgical History:  Procedure Laterality Date   . ANTERIOR CERVICAL DECOMP/DISCECTOMY FUSION  04/09/2011   Procedure: ANTERIOR CERVICAL DECOMPRESSION/DISCECTOMY FUSION 1 LEVEL;  Surgeon: Temple Pacini, MD;  Location: [facility];  Service: Neurosurgery;  Laterality: N/A;  Cervical five-six Anterior Cervical Decompression and Fusion  . APPENDECTOMY    . BILATERAL OOPHORECTOMY     BENIGN  . CARDIAC CATHETERIZATION     X 2    LAST ONE  2009  . CESAREAN SECTION     X  4  . CHOLECYSTECTOMY    . IMPLANTABLE CARDIOVERTER DEFIBRILLATOR (ICD) GENERATOR CHANGE Left 01/18/2019   Procedure: ICD GENERATOR CHANGE;  Surgeon: Marcina Millard, MD;  Location: [facility] ORS;  Service: Cardiovascular;  Laterality: Left;  . INSERT / REPLACE / REMOVE PACEMAKER     ST  JUDE  DEVICE  . JOINT REPLACEMENT     BIL  KNEES  . TONSILLECTOMY    . TUBAL LIGATION        Current Plan: Obtain lab work including lactic acid level, blood cultures, urinalysis, troponin, VBG and ammonia level.   MDM/ED Course: Chest x-ray showing vascular congestion, no signs of pneumonia.  CT of the abdomen pelvis without any abnormal findings.  CT of the head without any abnormal findings.  Lab work significant for ammonia of 39, hemoglobin 8.6.  Lactic acid level is normal.  Troponin elevated to 764.  On recheck patient denying any chest pain.  They were unable to get urine from an In-N-Out cath and her bladder scan read 0 cc.  I suspect that this elevated troponin is from demand ischemia.  Suspect dehydration in the setting of diarrheal illness.    Patient and POA at the bedside, [name], agreeable to admission.  We have ordered a 3rd L of fluids.   Consults: Hospitalist Dr. Allena Katz   Significant labs/images: Labs Reviewed  COMPREHENSIVE METABOLIC PANEL - Abnormal; Notable for the following components:      Result Value   CO2 34 (*)    Glucose, Bld 143 (*)    Creatinine, Ser 1.86 (*)    Calcium 8.5 (*)    Total Protein 6.1 (*)    Albumin 3.1 (*)    AST 416 (*)    ALT  272 (*)    GFR, Estimated 28 (*)    All other components within normal limits  CBC WITH DIFFERENTIAL/PLATELET - Abnormal; Notable for the following components:   RBC 2.94 (*)    Hemoglobin 8.6 (*)    HCT 29.0 (*)    MCHC 29.7 (*)    All other components within normal limits  PROTIME-INR - Abnormal; Notable for the following components:   Prothrombin Time 20.8 (*)    INR 1.8 (*)    All other components within normal limits  APTT -  Abnormal; Notable for the following components:   aPTT 52 (*)    All other components within normal limits  AMMONIA - Abnormal; Notable for the following components:   Ammonia 39 (*)    All other components within normal limits  I-STAT VENOUS BLOOD GAS, ED - Abnormal; Notable for the following components:   pO2, Ven 64.0 (*)    Bicarbonate 36.6 (*)    TCO2 38 (*)    Acid-Base Excess 10.0 (*)    Calcium, Ion 1.10 (*)    HCT 27.0 (*)    Hemoglobin 9.2 (*)    All other components within normal limits  TROPONIN I (HIGH SENSITIVITY) - Abnormal; Notable for the following components:   Troponin I (High Sensitivity) 764 (*)    All other components within normal limits  RESP PANEL BY RT-PCR (FLU A&B, COVID) ARPGX2  URINE CULTURE  CULTURE, BLOOD (ROUTINE X 2)  CULTURE, BLOOD (ROUTINE X 2)  LACTIC ACID, PLASMA  URINALYSIS, ROUTINE W REFLEX MICROSCOPIC  BRAIN NATRIURETIC PEPTIDE  HEPATITIS PANEL, ACUTE  TROPONIN I (HIGH SENSITIVITY)    I personally reviewed and interpreted all labs.  The plan for this patient was discussed with Dr. Clarene Duke, who voiced agreement and who oversaw evaluation and treatment of this patient.    Portions of this note were generated with Scientist, clinical (histocompatibility and immunogenetics). Dictation errors may occur despite best attempts at proofreading.       Dietrich Pates, PA-C 07/11/20 1841    Clarene Duke Ambrose [place], MD 07/11/20 2351

## 2020-07-11 NOTE — ED Notes (Signed)
Waiting for transport

## 2020-07-11 NOTE — ED Notes (Signed)
Spoke with DIL, on her way here. Pt to CT. IVF infusing.

## 2020-07-11 NOTE — ED Notes (Signed)
Dry I&O cath. 2nd IVF NS liter hung.

## 2020-07-11 NOTE — H&P (Signed)
History and Physical    [name] DQQ:[id number] DOB: [birth year] DOA: 07/11/2020  PCP: Baxter Hire, MD  Patient coming from: [facility]  I have personally briefly reviewed patient's old medical records in [place]  Chief Complaint: Generalized weakness, diarrhea  HPI: [name] is a 80 y.o. female with medical history significant for chronic atrial fibrillation on Eliquis s/p CRT-D, chronic diastolic CHF, chronic respiratory failure with hypoxia on 2 L O2 via [place], T2DM, HTN, HLD, and OSA/OHS on BiPAP who presents to the ED for evaluation of diarrhea and generalized weakness.  History is supplemented by her friend Juliann Pulse who is POA.  Patient reports approximately 1 week of loose stools/diarrhea.  She had a transient episode of nausea with vomiting and subjective fever a couple days ago.  She has been feeling progressively weak and fatigued.  She has not been able to maintain adequate oral intake.  She has not been able to work with her rehab team as well as before.  She reports chronic urinary incontinence without dysuria but has noticed some malodorous urine recently.  She reports chronic intermittent shortness of breath which is unchanged from baseline.  She denies any chest pain or abdominal pain.  She has noted some recent swelling of her right hand which she attributes to overactivity of her wrist when attempting to push herself up to stand when working with PT.   ED Course:  Initial vitals show BP 123/57, pulse 71, RR 16, temp 98.5 F, SPO2 91% on 2 L supplemental O2 via [place].  Labs show BUN 15, creatinine 1.86 (0.66 on 04/16/2020), sodium 141, potassium 3.9, bicarb 34, AST 416, ALT 272, alk phos 47, total bilirubin 0.8, WBC 9.1, hemoglobin 8.6, platelets 177,000, lactic acid 1.9, INR 1.8, high-sensitivity troponin I 764, ammonia 39.  SARS-CoV-2 PCR negative.  Influenza A/B PCR negative.  Blood cultures obtained and pending.  Portable chest x-ray shows CRT-D in  place with stable cardiomegaly and vascular congestion.  No acute airspace disease or pleural effusion.  CT abdomen/pelvis without contrast shows diverticulosis of descending and sigmoid colon without inflammation, no acute abnormality seen.  CT head without contrast negative for acute intracranial abnormality.  Patient was given 3 L normal saline and the hospitalist service was consulted to admit for further evaluation and management  Review of Systems: All systems reviewed and are negative except as documented in history of present illness above.   Past Medical History:  Diagnosis Date  . AICD (automatic cardioverter/defibrillator) present    PLACED BY  DR  KLEIN 2008  . Angina    5-6 YRS AGO  . Anxiety   . Arthritis   . Atrial fibrillation ([place])   . CHF (congestive heart failure) ([place])   . Complication of anesthesia    states at times had a tough time waking up  . Diabetes mellitus   . Diverticulum of esophagus    large diverticulum in the middle third of the esophagus [facility] An EGD '09)  . Dysrhythmia    A-FIB  . Fibromyalgia   . Headache(784.0)   . Heartburn   . History of stomach ulcers   . History of transient ischemic attack (TIA)    LAST ONE IN  2005  IN  [place], [place]  . HLD (hyperlipidemia)   . Hypertension   . Shortness of breath    EASING OFF NOW  . Sleep apnea    DOESN'T KNOW SETTINGS  . Stroke (cerebrum) ([place])   . TIA (  transient ischemic attack)     Past Surgical History:  Procedure Laterality Date  . ANTERIOR CERVICAL DECOMP/DISCECTOMY FUSION  04/09/2011   Procedure: ANTERIOR CERVICAL DECOMPRESSION/DISCECTOMY FUSION 1 LEVEL;  Surgeon: Charlie Pitter, MD;  Location: [facility];  Service: Neurosurgery;  Laterality: N/A;  Cervical five-six Anterior Cervical Decompression and Fusion  . APPENDECTOMY    . BILATERAL OOPHORECTOMY     BENIGN  . CARDIAC CATHETERIZATION     X 2    LAST ONE  2009  . CESAREAN SECTION     X  4  . CHOLECYSTECTOMY    . IMPLANTABLE  CARDIOVERTER DEFIBRILLATOR (ICD) GENERATOR CHANGE Left 01/18/2019   Procedure: ICD GENERATOR CHANGE;  Surgeon: Isaias Cowman, MD;  Location: [facility] ORS;  Service: Cardiovascular;  Laterality: Left;  . INSERT / REPLACE / [place]  . JOINT REPLACEMENT     BIL  KNEES  . TONSILLECTOMY    . TUBAL LIGATION      Social History:  reports that she quit smoking about 29 years ago. Her smoking use included cigarettes. She has a 20.00 pack-year smoking history. She has never used smokeless tobacco. She reports that she does not drink alcohol and does not use drugs.  Allergies  Allergen Reactions  . Ciprofloxacin Hcl Itching    Rash and itching all over stomach and arms  . Dopamine Nausea And Vomiting  . Sulfa Antibiotics Rash  . Sulfacetamide Sodium Rash  . Tegaderm Ag Mesh [Silver] Other (See Comments) and Rash    Blisters and takes skin off    Family History  Problem Relation Age of Onset  . Kidney disease Neg Hx   . Bladder Cancer Neg Hx      Prior to Admission medications   Medication Sig Start Date End Date Taking? Authorizing Provider  albuterol (VENTOLIN HFA) 108 (90 Base) MCG/ACT inhaler Inhale 2 puffs into the lungs every 4 (four) hours as needed for wheezing or shortness of breath.    [provider]  alendronate (FOSAMAX) 70 MG tablet Take 70 mg by mouth every Monday.     [provider]  carvedilol (COREG) 12.5 MG tablet Take 12.5 mg by mouth 2 (two) times daily with a meal.    [provider]  Cholecalciferol 25 MCG (1000 UT) tablet Take 1,000 Units by mouth daily.     [provider]  Cyanocobalamin 1000 MCG TBCR Take 1,000 mcg by mouth daily.     [provider]  diazepam (VALIUM) 2 MG tablet Take 2 mg by mouth 2 (two) times daily. 09/21/19   [provider]  ELIQUIS 5 MG TABS tablet Take 5 mg by mouth 2 (two) times daily. 09/21/19   [provider]  furosemide (LASIX) 20 MG  tablet Take 10 mg by mouth daily.    [provider]  gabapentin (NEURONTIN) 100 MG capsule Take 200 mg by mouth 3 (three) times daily.  11/30/13   [provider]  metFORMIN (GLUCOPHAGE) 500 MG tablet Take 500 mg by mouth 2 (two) times daily with a meal.    [provider]  Multiple Vitamins-Minerals (MULTIVITAMIN WITH MINERALS) tablet Take 1 tablet by mouth daily.    [provider]  omeprazole (PRILOSEC) 20 MG capsule Take 20 mg by mouth daily.    [provider]  pioglitazone (ACTOS) 15 MG tablet Take 15 mg by mouth daily.    [provider]  quinapril (ACCUPRIL)  20 MG tablet Take 10-20 mg by mouth See admin instructions. Take 20 mg in the morning, and 10 mg in the evening    [provider]  rosuvastatin (CRESTOR) 10 MG tablet Take 10 mg by mouth daily.    [provider]  sertraline (ZOLOFT) 100 MG tablet Take 100 mg by mouth daily.    [provider]    Physical Exam: Vitals:   07/11/20 1715 07/11/20 1728 07/11/20 1730 07/11/20 1745  BP: 134/71  (!) 131/53 126/67  Pulse: 66  69 70  Resp: 19  (!) 21 19  Temp:  98.6 F (37 C)    TempSrc:  Rectal    SpO2: 96%  97% 96%  Weight:      Height:       Constitutional: Obese woman resting supine in bed, appears tired but in NAD, calm, comfortable Eyes: PERRL, lids and conjunctivae normal ENMT: Mucous membranes are dry. Posterior pharynx clear of any exudate or lesions.poor dentition.  Neck: normal, supple, no masses. Respiratory: clear to auscultation bilaterally, no wheezing, no crackles. Normal respiratory effort. No accessory muscle use.  Cardiovascular: Regular rate and rhythm, no murmurs / rubs / gallops. No extremity edema. 2+ pedal pulses.  CRT-D in place left chest wall. Abdomen: no tenderness, no masses palpated. No hepatosplenomegaly. Bowel sounds positive.  Musculoskeletal: no clubbing / cyanosis. No joint deformity upper and lower extremities. Good  ROM, no contractures. Normal muscle tone.  Skin: no rashes, lesions, ulcers. No induration Neurologic: CN 2-12 grossly intact. Sensation intact. Strength 5/5 in all 4.  Psychiatric: Normal judgment and insight. Alert and oriented x 3. Normal mood.   Labs on Admission: I have personally reviewed following labs and imaging studies  CBC: Recent Labs  Lab 07/11/20 1515 07/11/20 1532  WBC 9.1  --   NEUTROABS 6.7  --   HGB 8.6* 9.2*  HCT 29.0* 27.0*  MCV 98.6  --   PLT 177  --    Basic Metabolic Panel: Recent Labs  Lab 07/11/20 1515 07/11/20 1532  NA 141 141  K 3.9 3.9  CL 98  --   CO2 34*  --   GLUCOSE 143*  --   BUN 15  --   CREATININE 1.86*  --   CALCIUM 8.5*  --    GFR: Estimated Creatinine Clearance: 30.5 mL/min (A) (by C-G formula based on SCr of 1.86 mg/dL (H)). Liver Function Tests: Recent Labs  Lab 07/11/20 1515  AST 416*  ALT 272*  ALKPHOS 47  BILITOT 0.8  PROT 6.1*  ALBUMIN 3.1*   No results for input(s): LIPASE, AMYLASE in the last 168 hours. Recent Labs  Lab 07/11/20 1515  AMMONIA 39*   Coagulation Profile: Recent Labs  Lab 07/11/20 1515  INR 1.8*   Cardiac Enzymes: No results for input(s): CKTOTAL, CKMB, CKMBINDEX, TROPONINI in the last 168 hours. BNP (last 3 results) No results for input(s): PROBNP in the last 8760 hours. HbA1C: No results for input(s): HGBA1C in the last 72 hours. CBG: No results for input(s): GLUCAP in the last 168 hours. Lipid Profile: No results for input(s): CHOL, HDL, LDLCALC, TRIG, CHOLHDL, LDLDIRECT in the last 72 hours. Thyroid Function Tests: No results for input(s): TSH, T4TOTAL, FREET4, T3FREE, THYROIDAB in the last 72 hours. Anemia Panel: No results for input(s): VITAMINB12, FOLATE, FERRITIN, TIBC, IRON, RETICCTPCT in the last 72 hours. Urine analysis:    Component Value Date/Time   COLORURINE Yellow 12/07/2012 1209   APPEARANCEUR Cloudy (A) 02/17/2018  1247   LABSPEC 1.012 12/07/2012 1209   PHURINE  6.0 12/07/2012 1209   GLUCOSEU Negative 02/17/2018 1247   GLUCOSEU Negative 12/07/2012 1209   HGBUR 1+ 12/07/2012 1209   BILIRUBINUR Negative 02/17/2018 1247   BILIRUBINUR Negative 12/07/2012 1209   KETONESUR Negative 12/07/2012 1209   PROTEINUR 1+ (A) 02/17/2018 1247   PROTEINUR Negative 12/07/2012 1209   NITRITE Negative 02/17/2018 1247   NITRITE Positive 12/07/2012 1209   LEUKOCYTESUR 1+ (A) 02/17/2018 1247   LEUKOCYTESUR 1+ 12/07/2012 1209    Radiological Exams on Admission: CT ABDOMEN PELVIS WO CONTRAST  Result Date: 07/11/2020 CLINICAL DATA:  Acute generalized abdominal pain. EXAM: CT ABDOMEN AND PELVIS WITHOUT CONTRAST TECHNIQUE: Multidetector CT imaging of the abdomen and pelvis was performed following the standard protocol without IV contrast. COMPARISON:  October 25, 2019. FINDINGS: Lower chest: No acute abnormality. Hepatobiliary: No focal liver abnormality is seen. Status post cholecystectomy. No biliary dilatation. Pancreas: Unremarkable. No pancreatic ductal dilatation or surrounding inflammatory changes. Spleen: Calcified splenic granulomata are noted. Adrenals/Urinary Tract: Adrenal glands appear normal. Stable left renal cysts are noted. No hydronephrosis or renal obstruction is noted. No renal or ureteral calculi are noted. Urinary bladder is unremarkable. Stomach/Bowel: Stomach is within normal limits. No evidence of bowel wall thickening, distention, or inflammatory changes. Diverticulosis of descending and sigmoid colon is noted without inflammation. Vascular/Lymphatic: Aortic atherosclerosis. No enlarged abdominal or pelvic lymph nodes. Reproductive: Uterus and bilateral adnexa are unremarkable. Other: No abdominal wall hernia or abnormality. No abdominopelvic ascites. Musculoskeletal: No acute or significant osseous findings. IMPRESSION: Diverticulosis of descending and sigmoid colon is noted without inflammation. No acute abnormality seen in the abdomen or pelvis. Aortic  Atherosclerosis (ICD10-I70.0). Electronically Signed   By: Marijo Conception M.D.   On: 07/11/2020 16:11   CT Head Wo Contrast  Result Date: 07/11/2020 CLINICAL DATA:  Altered mental status.  Weakness. EXAM: CT HEAD WITHOUT CONTRAST TECHNIQUE: Contiguous axial images were obtained from the base of the skull through the vertex without intravenous contrast. COMPARISON:  04/12/2020 FINDINGS: Brain: Normal for age atrophy with mild chronic small vessel ischemia. No intracranial hemorrhage, mass effect, or midline shift. No hydrocephalus. The basilar cisterns are patent. No evidence of territorial infarct or acute ischemia. No extra-axial or intracranial fluid collection. Vascular: Atherosclerosis of skullbase vasculature without hyperdense vessel or abnormal calcification. Skull: No fracture or focal lesion.  Calvarial hyperostosis. Sinuses/Orbits: Improved paranasal sinus inflammation from prior exam. Minimal residual mucosal thickening involving the left side of sphenoid sinus. Bilateral cataract resection. Mastoid air cells are clear. Other: None. IMPRESSION: 1. No acute intracranial abnormality. 2. Normal for age atrophy with mild chronic small vessel ischemia. 3. Improved paranasal sinus inflammation from prior exam. Electronically Signed   By: Keith Rake M.D.   On: 07/11/2020 16:06   DG Chest Port 1 View  Result Date: 07/11/2020 CLINICAL DATA:  Questionable sepsis - evaluate for abnormality Weakness and fever. EXAM: PORTABLE CHEST 1 VIEW COMPARISON:  Chest radiograph 04/12/2020 FINDINGS: Multi lead left-sided pacemaker in place. Stable cardiomegaly. Unchanged mediastinal contours with aortic atherosclerosis. Similar vascular congestion. No acute airspace disease. No large pleural effusion. No pneumothorax. No acute osseous abnormalities are seen. IMPRESSION: Stable cardiomegaly and vascular congestion. No acute findings. Electronically Signed   By: Keith Rake M.D.   On: 07/11/2020 15:20    EKG:  Personally reviewed. Paced rhythm.  Similar to prior.  Assessment/Plan Principal Problem:   Acute kidney injury [facility]) Active Problems:   Generalized weakness   Sleep  apnea   Chronic atrial fibrillation (HCC)   Elevated troponin   Elevated LFTs   Chronic respiratory failure with hypoxia (H[facility])   Type 2 diabetes mellitus ([place])   Hypertension associated with diabetes ([place])   Hyperlipidemia associated with type 2 diabetes mellitus (H[facility])   CYLA HALUSKA is a 80 y.o. female with medical history significant for chronic atrial fibrillation on Eliquis s/p CRT-D, chronic diastolic CHF, chronic respiratory failure with hypoxia on 2 L O2 via [place], T2DM, HTN, HLD, and OSA/OHS on BiPAP who is admitted with AKI and generalized weakness.  Acute kidney injury: Likely multifactorial from dehydration due to GI losses plus medication effect. -CT A/P shows stable left renal cyst without hydronephrosis or renal obstruction, no renal or ureteral calculi noted -Continue gentle IV fluid hydration overnight -Holding home metformin, Lasix, quinapril  Elevated troponin: Suspect demand ischemia related to volume depletion.  Patient denies any chest pain.  EKG shows paced rhythm.  Will continue to monitor on telemetry, trend cardiac enzymes.  If troponin significantly rising then we will consider further cardiac evaluation at that time.  Elevated LFTs: Suspect due to hypoperfusion from volume depletion.  Hold rosuvastatin and repeat labs in AM.  Acute on chronic generalized weakness: Worsened in the setting of volume depletion.  Request PT/OT eval.  Chronic atrial fibrillation s/p CRT-D: Chronic and stable.  Continue Eliquis and Coreg.  Chronic respiratory failure with hypoxia: Likely multifactorial from chronic diastolic CHF and OSA/OHS. Stable on home 2 L O2 via [place].  Chronic diastolic CHF: Appears volume depleted on admission.  Holding home Lasix.  Monitor strict I/O's.  Type 2 diabetes: Holding home  metformin and Actos.  Placed on SSI.  Hypertension: Continue Coreg, holding quinapril and Lasix.  Hyperlipidemia: Holding rosuvastatin.  Depression/anxiety: Continue home diazepam 2 mg twice daily with hold parameters.  Holding sertraline with abnormal liver function.  OSA/OHS: Continue BiPAP nightly.  DVT prophylaxis: Eliquis Code Status: DNR, confirmed with patient Family Communication: Discussed with patient's friend Juliann Pulse at bedside Disposition Plan: From SNF and likely return to SNF pending clinical progress Consults called: None Level of care: Telemetry Cardiac Admission status:  Status is: Observation  The patient remains OBS appropriate and will d/c before 2 midnights.  Dispo: The patient is from: SNF              Anticipated d/c is to: SNF              Patient currently is not medically stable to d/c.   Difficult to place patient No  Zada Finders MD [facility]  If 7PM-7AM, please contact night-coverage [email or web address]  07/11/2020, 6:40 PM

## 2020-07-11 NOTE — ED Notes (Signed)
Received verbal report from Jonathon C at this time 

## 2020-07-11 NOTE — ED Notes (Signed)
EDPA notified of critical troponin

## 2020-07-11 NOTE — ED Notes (Signed)
ED PA at [facility] 

## 2020-07-11 NOTE — ED Provider Notes (Signed)
[facility] EMERGENCY DEPARTMENT Provider Note   CSN: [id number] Arrival date & time: 07/11/20  1422     History Chief Complaint  Patient presents with  . Weakness    [name] is a 80 y.o. female history of AICD, atrial fibrillation, CHF, diabetes, fibromyalgia, stomach ulcer, CVA, obesity.  Patient presents today from nursing home for evaluation of bilateral upper extremity weakness, fever, fatigue.  Patient reports he has been experiencing diarrhea for around 1 week, she denies any associated pain.  Patient reports that she has had a stroke before and is normally somewhat weaker on the left compared to right.  She is alert to self, place and event but unclear to year she reports the year is 65.  Level 5 caveat altered mental status.  I attempted to call patient's nursing home however there was no answer.  EMS is left prior to my evaluation  Per triage note patient had a fever today, and staff felt patient was weaker right compared to left but that was her norm?  Patient on 2 L nasal cannula for hypoxia nursing home but patient reports that she is normally on oxygen?  HPI     Past Medical History:  Diagnosis Date  . AICD (automatic cardioverter/defibrillator) present    PLACED BY  DR  Graciela Husbands 2008  . Angina    5-6 YRS AGO  . Anxiety   . Arthritis   . Atrial fibrillation (HCC)   . CHF (congestive heart failure) (HCC)   . Complication of anesthesia    states at times had a tough time waking up  . Diabetes mellitus   . Diverticulum of esophagus    large diverticulum in the middle third of the esophagus [facility] EGD '09)  . Dysrhythmia    A-FIB  . Fibromyalgia   . Headache(784.0)   . Heartburn   . History of stomach ulcers   . History of transient ischemic attack (TIA)    LAST ONE IN  2005  IN  [place], [place]  . HLD (hyperlipidemia)   . Hypertension   . Shortness of breath    EASING OFF NOW  . Sleep apnea    DOESN'T KNOW SETTINGS  . Stroke  (cerebrum) (HCC)   . TIA (transient ischemic attack)     Patient Active Problem List   Diagnosis Date Noted  . Weakness 04/12/2020  . Obesity, Class III, BMI 40-49.9 (morbid obesity) (HCC) 04/12/2020  . Obesity, diabetes, and hypertension syndrome (HCC) 04/12/2020  . Metabolic syndrome 04/12/2020  . Shortness of breath 04/12/2020  . Overdose of coumadin, accidental or unintentional, initial encounter 07/29/2019  . 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 Piriformis syndrome 06/18/2014  . Facet syndrome, lumbar 06/18/2014  . 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 DDD (degenerative disc disease), lumbar 06/18/2014  . DDD (degenerative disc disease), cervical 06/08/2014  . 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 Cervical spinal stenosis 04/09/2011    Past Surgical History:  Procedure Laterality Date  . ANTERIOR CERVICAL DECOMP/DISCECTOMY FUSION  04/09/2011   Procedure: ANTERIOR CERVICAL DECOMPRESSION/DISCECTOMY FUSION 1 LEVEL;  Surgeon: Temple Pacini, MD;  Location: [facility];  Service: Neurosurgery;  Laterality: N/A;  Cervical five-six Anterior Cervical Decompression and Fusion  . APPENDECTOMY    . BILATERAL OOPHORECTOMY     BENIGN  . CARDIAC CATHETERIZATION     X 2    LAST ONE  2009  . CESAREAN SECTION  X  4  . CHOLECYSTECTOMY    . IMPLANTABLE CARDIOVERTER DEFIBRILLATOR (ICD) GENERATOR CHANGE Left 01/18/2019   Procedure: ICD GENERATOR CHANGE;  Surgeon: Marcina MillardParaschos, Alexander, MD;  Location: [facility] ORS;  Service: Cardiovascular;  Laterality: Left;  . INSERT / REPLACE / REMOVE PACEMAKER     ST  JUDE  DEVICE  . JOINT REPLACEMENT     BIL  KNEES  . TONSILLECTOMY    . TUBAL LIGATION       OB History   No obstetric history on file.     Family History  Problem Relation Age of Onset  . Kidney disease Neg Hx   . Bladder Cancer Neg Hx     Social History   Tobacco Use  . Smoking status: Former Smoker    Packs/day: 1.00    Years:  20.00    Pack years: 20.00    Types: Cigarettes    Quit date: 03/19/1991    Years since quitting: 29.3  . Smokeless tobacco: Never Used  Vaping Use  . Vaping Use: Never used  Substance Use Topics  . Alcohol use: No  . Drug use: No    Home Medications Prior to Admission medications   Medication Sig Start Date End Date Taking? Authorizing Provider  albuterol (VENTOLIN HFA) 108 (90 Base) MCG/ACT inhaler Inhale 2 puffs into the lungs every 4 (four) hours as needed for wheezing or shortness of breath.    [provider]  alendronate (FOSAMAX) 70 MG tablet Take 70 mg by mouth every Monday.     [provider]  carvedilol (COREG) 12.5 MG tablet Take 12.5 mg by mouth 2 (two) times daily with a meal.    [provider]  Cholecalciferol 25 MCG (1000 UT) tablet Take 1,000 Units by mouth daily.     [provider]  Cyanocobalamin 1000 MCG TBCR Take 1,000 mcg by mouth daily.     [provider]  diazepam (VALIUM) 2 MG tablet Take 2 mg by mouth 2 (two) times daily. 09/21/19   [provider]  ELIQUIS 5 MG TABS tablet Take 5 mg by mouth 2 (two) times daily. 09/21/19   [provider]  furosemide (LASIX) 20 MG tablet Take 10 mg by mouth daily.    [provider]  gabapentin (NEURONTIN) 100 MG capsule Take 200 mg by mouth 3 (three) times daily.  11/30/13   [provider]  metFORMIN (GLUCOPHAGE) 500 MG tablet Take 500 mg by mouth 2 (two) times daily with a meal.    [provider]  Multiple Vitamins-Minerals (MULTIVITAMIN WITH MINERALS) tablet Take 1 tablet by mouth daily.    [provider]  omeprazole (PRILOSEC) 20 MG capsule Take 20 mg by mouth daily.    [provider]  pioglitazone (ACTOS) 15 MG tablet Take 15 mg by mouth daily.    [provider]  quinapril (ACCUPRIL) 20 MG tablet Take 10-20 mg by mouth See admin instructions. Take 20 mg in the morning, and 10 mg in the evening     [provider]  rosuvastatin (CRESTOR) 10 MG tablet Take 10 mg by mouth daily.    [provider]  sertraline (ZOLOFT) 100 MG tablet Take 100 mg by mouth daily.    [provider]    Allergies    Ciprofloxacin hcl, Dopamine, Sulfa antibiotics, Sulfacetamide sodium, and Tegaderm ag mesh [silver]  Review of Systems   Review of Systems  Unable to perform ROS: Mental status change  Physical Exam Updated Vital Signs There were no vitals taken for this visit.  Physical Exam Constitutional:      Appearance: She is well-developed. She is obese. She is ill-appearing ( Chronically ill-appearing). She is not toxic-appearing.  HENT:     Head: Normocephalic and atraumatic.     Jaw: There is normal jaw occlusion. No trismus.     Right Ear: External ear normal.     Left Ear: External ear normal.     Nose: Nose normal.  Eyes:     General: Vision grossly intact. Gaze aligned appropriately.     Extraocular Movements: Extraocular movements intact.     Conjunctiva/sclera: Conjunctivae normal.  Neck:     Trachea: Trachea and phonation normal.  Cardiovascular:     Rate and Rhythm: Normal rate and regular rhythm.  Pulmonary:     Effort: Pulmonary effort is normal. No accessory muscle usage or respiratory distress.     Breath sounds: Normal air entry.  Chest:     Chest wall: No tenderness.  Abdominal:     Tenderness: There is no abdominal tenderness. There is no guarding or rebound.  Musculoskeletal:     Cervical back: Normal range of motion and neck supple.     Comments: No midline C/T/L spinal tenderness to palpation, no paraspinal muscle tenderness, no deformity, crepitus, or step-off noted. No sign of injury to the neck or back.  Patient is able to pull to sit up straight without assistance.  Neurological:     Mental Status: She is alert.     GCS: GCS eye subscore is 4. GCS verbal subscore is 5. GCS motor subscore is 6.     Comments: Patient alert to self,  place, event, confused to year reports the year is 54.  Speech is clear and goal oriented, follows commands Major Cranial nerves without deficit, no facial droop Weak movements with bilateral upper extremities however appears symmetric and strength.  Psychiatric:        Behavior: Behavior is cooperative.     ED Results / Procedures / Treatments   Labs (all labs ordered are listed, but only abnormal results are displayed) Labs Reviewed  URINE CULTURE  CULTURE, BLOOD (ROUTINE X 2)  CULTURE, BLOOD (ROUTINE X 2)  LACTIC ACID, PLASMA  LACTIC ACID, PLASMA  COMPREHENSIVE METABOLIC PANEL  CBC WITH DIFFERENTIAL/PLATELET  PROTIME-INR  APTT  URINALYSIS, ROUTINE W REFLEX MICROSCOPIC  TROPONIN I (HIGH SENSITIVITY)    EKG None  Radiology No results found.  Procedures Procedures   Medications Ordered in ED Medications - No data to display  ED Course  I have reviewed the triage vital signs and the nursing notes.  Pertinent labs & imaging results that were available during my care of the patient were reviewed by me and considered in my medical decision making (see chart for details).  Clinical Course as of 07/11/20 1506  Wed Jul 11, 2020  1433 Joetta Manners no answer [BM]  1624 [phone] - no answer [BM]  1434 [phone] - straight to voicemail [BM]  1434 [phone] - [name] (reports is patients POA) [BM]    Clinical Course User Index [BM] Elizabeth Palau   MDM Rules/Calculators/A&P                         Additional history obtained from: 1. Nursing notes from this visit. 2. Review of electronic medical records --------------- 80 year old female presented from nursing home today for concern of fever, questionable  hypoxia, diarrhea and bilateral upper extremity weakness.  Patient is slightly confused on exam, level 5 caveat due to altered mental status she is confused to the year.  She did not recall having a fever prior to arrival with notes as documented  by EMS.  I attempted to call patient's nursing home however they did not answer.  I attempted to call multiple contacts on patient's list without answer.  I was able to finally reach [place], patient's friend who lives in [place].  [name] identified herself as the patient's power of attorney, she reports the patient is normally fully alert and oriented, [name] visited the patient yesterday and reports that she had been experiencing some back pain and diarrhea for the past week.  She was not aware patient was in the hospital at this time and is coming from [place] to visit.  On my initial evaluation patient is generally weak but no unilateral weakness on exam.  No obvious cranial nerve deficits, no meningismus, cardiopulmonary exam is unremarkable, abdomen is nontender on exam and she is moving all 4 extremities and can sit up without assistance.  Vital signs are stable at this time she is on 2 L nasal cannula and it is unclear if this is her baseline at this time.  Considering she is afebrile here without tachycardia and with a stable blood pressure do not feel that antibiotics and 30 cc/kg fluid are indicated at this time but will initiate sepsis order set and obtain labs and imaging to evaluate for infection and then give fluids and antibiotics if indicated.  Additionally with new confusion will obtain CT head, however patient symptoms are not fully consistent with CVA at this time.  Patient was admitted a few months ago for encephalopathy due to hypercapnia I have added on a VBG to her work up along with an ammonia level.  Care handoff given to [facility] PA-C at shift change at shift change, plan of care is to follow-up on pending labs and imaging and reassess the patient.  Final disposition per oncoming team.  Discussed plan with Dr. Clarene Duke.  Note: Portions of this report may have been transcribed using voice recognition software. Every effort was made to ensure accuracy; however, inadvertent computerized  transcription errors may still be present.  Final Clinical Impression(s) / ED Diagnoses Final diagnoses:  None    Rx / DC Orders ED Discharge Orders    None       Elizabeth Palau 07/11/20 1520    Terrilee Files, MD 07/11/20 2112

## 2020-07-12 DIAGNOSIS — E1169 Type 2 diabetes mellitus with other specified complication: Secondary | ICD-10-CM

## 2020-07-12 DIAGNOSIS — R7989 Other specified abnormal findings of blood chemistry: Secondary | ICD-10-CM

## 2020-07-12 DIAGNOSIS — Z981 Arthrodesis status: Secondary | ICD-10-CM | POA: Diagnosis not present

## 2020-07-12 DIAGNOSIS — J9611 Chronic respiratory failure with hypoxia: Secondary | ICD-10-CM | POA: Diagnosis present

## 2020-07-12 DIAGNOSIS — Z20822 Contact with and (suspected) exposure to covid-19: Secondary | ICD-10-CM | POA: Diagnosis present

## 2020-07-12 DIAGNOSIS — Z8673 Personal history of transient ischemic attack (TIA), and cerebral infarction without residual deficits: Secondary | ICD-10-CM | POA: Diagnosis not present

## 2020-07-12 DIAGNOSIS — E785 Hyperlipidemia, unspecified: Secondary | ICD-10-CM

## 2020-07-12 DIAGNOSIS — M797 Fibromyalgia: Secondary | ICD-10-CM | POA: Diagnosis present

## 2020-07-12 DIAGNOSIS — N179 Acute kidney failure, unspecified: Secondary | ICD-10-CM | POA: Diagnosis present

## 2020-07-12 DIAGNOSIS — F32A Depression, unspecified: Secondary | ICD-10-CM | POA: Diagnosis present

## 2020-07-12 DIAGNOSIS — I152 Hypertension secondary to endocrine disorders: Secondary | ICD-10-CM | POA: Diagnosis present

## 2020-07-12 DIAGNOSIS — G4733 Obstructive sleep apnea (adult) (pediatric): Secondary | ICD-10-CM | POA: Diagnosis present

## 2020-07-12 DIAGNOSIS — Z8711 Personal history of peptic ulcer disease: Secondary | ICD-10-CM | POA: Diagnosis not present

## 2020-07-12 DIAGNOSIS — Z6841 Body Mass Index (BMI) 40.0 and over, adult: Secondary | ICD-10-CM | POA: Diagnosis not present

## 2020-07-12 DIAGNOSIS — I482 Chronic atrial fibrillation, unspecified: Secondary | ICD-10-CM | POA: Diagnosis present

## 2020-07-12 DIAGNOSIS — R7401 Elevation of levels of liver transaminase levels: Secondary | ICD-10-CM | POA: Diagnosis present

## 2020-07-12 DIAGNOSIS — I959 Hypotension, unspecified: Secondary | ICD-10-CM | POA: Diagnosis present

## 2020-07-12 DIAGNOSIS — F419 Anxiety disorder, unspecified: Secondary | ICD-10-CM | POA: Diagnosis present

## 2020-07-12 DIAGNOSIS — I248 Other forms of acute ischemic heart disease: Secondary | ICD-10-CM | POA: Diagnosis present

## 2020-07-12 DIAGNOSIS — Z66 Do not resuscitate: Secondary | ICD-10-CM | POA: Diagnosis present

## 2020-07-12 DIAGNOSIS — E860 Dehydration: Secondary | ICD-10-CM | POA: Diagnosis present

## 2020-07-12 DIAGNOSIS — M1990 Unspecified osteoarthritis, unspecified site: Secondary | ICD-10-CM | POA: Diagnosis present

## 2020-07-12 DIAGNOSIS — I5032 Chronic diastolic (congestive) heart failure: Secondary | ICD-10-CM | POA: Diagnosis present

## 2020-07-12 DIAGNOSIS — I110 Hypertensive heart disease with heart failure: Secondary | ICD-10-CM | POA: Diagnosis present

## 2020-07-12 LAB — CBC
HCT: 28.1 % — ABNORMAL LOW (ref 36.0–46.0)
Hemoglobin: 8.3 g/dL — ABNORMAL LOW (ref 12.0–15.0)
MCH: 29.1 pg (ref 26.0–34.0)
MCHC: 29.5 g/dL — ABNORMAL LOW (ref 30.0–36.0)
MCV: 98.6 fL (ref 80.0–100.0)
Platelets: 145 10*3/uL — ABNORMAL LOW (ref 150–400)
RBC: 2.85 MIL/uL — ABNORMAL LOW (ref 3.87–5.11)
RDW: 14.7 % (ref 11.5–15.5)
WBC: 6.9 10*3/uL (ref 4.0–10.5)
nRBC: 0 % (ref 0.0–0.2)

## 2020-07-12 LAB — COMPREHENSIVE METABOLIC PANEL
ALT: 296 U/L — ABNORMAL HIGH (ref 0–44)
AST: 429 U/L — ABNORMAL HIGH (ref 15–41)
Albumin: 2.9 g/dL — ABNORMAL LOW (ref 3.5–5.0)
Alkaline Phosphatase: 45 U/L (ref 38–126)
Anion gap: 6 (ref 5–15)
BUN: 16 mg/dL (ref 8–23)
CO2: 34 mmol/L — ABNORMAL HIGH (ref 22–32)
Calcium: 7.9 mg/dL — ABNORMAL LOW (ref 8.9–10.3)
Chloride: 101 mmol/L (ref 98–111)
Creatinine, Ser: 1.7 mg/dL — ABNORMAL HIGH (ref 0.44–1.00)
GFR, Estimated: 31 mL/min — ABNORMAL LOW (ref >60)
Glucose, Bld: 104 mg/dL — ABNORMAL HIGH (ref 70–99)
Potassium: 3.5 mmol/L (ref 3.5–5.1)
Sodium: 141 mmol/L (ref 135–145)
Total Bilirubin: 0.7 mg/dL (ref 0.3–1.2)
Total Protein: 5.5 g/dL — ABNORMAL LOW (ref 6.5–8.1)

## 2020-07-12 LAB — URINALYSIS, ROUTINE W REFLEX MICROSCOPIC
Bilirubin Urine: NEGATIVE
Glucose, UA: NEGATIVE mg/dL
Hgb urine dipstick: NEGATIVE
Ketones, ur: NEGATIVE mg/dL
Nitrite: NEGATIVE
Protein, ur: 30 mg/dL — AB
Specific Gravity, Urine: 1.01 (ref 1.005–1.030)
pH: 5 (ref 5.0–8.0)

## 2020-07-12 LAB — HEPATITIS PANEL, ACUTE
HCV Ab: NONREACTIVE
Hep A IgM: NONREACTIVE
Hep B C IgM: NONREACTIVE
Hepatitis B Surface Ag: NONREACTIVE

## 2020-07-12 LAB — GLUCOSE, CAPILLARY
Glucose-Capillary: 85 mg/dL (ref 70–99)
Glucose-Capillary: 118 mg/dL — ABNORMAL HIGH (ref 70–99)
Glucose-Capillary: 132 mg/dL — ABNORMAL HIGH (ref 70–99)
Glucose-Capillary: 93 mg/dL (ref 70–99)

## 2020-07-12 LAB — TROPONIN I (HIGH SENSITIVITY)
Troponin I (High Sensitivity): 624 ng/L (ref <18)
Troponin I (High Sensitivity): 369 ng/L (ref <18)
Troponin I (High Sensitivity): 361 ng/L (ref <18)

## 2020-07-12 LAB — CREATININE, URINE, RANDOM: Creatinine, Urine: 94.45 mg/dL

## 2020-07-12 LAB — PROTIME-INR
INR: 1.7 — ABNORMAL HIGH (ref 0.8–1.2)
Prothrombin Time: 19.9 seconds — ABNORMAL HIGH (ref 11.4–15.2)

## 2020-07-12 LAB — SODIUM, URINE, RANDOM: Sodium, Ur: 71 mmol/L

## 2020-07-12 MED ORDER — LACTATED RINGERS IV SOLN: INTRAVENOUS | Status: DC

## 2020-07-12 NOTE — Progress Notes (Signed)
This chaplain responded to [facility] page to complete the Pt. HCPOA.  The Pt. is alert and expresses an interest in completing HCPOA education.  The Pt. plans to name her [place] as her healthcare agent.  The chaplain left the incomplete document with the Pt. to review.  The Pt. will ask the RN to update spiritual care department when [name] arrives on Friday. The Pt. hopes to notarize the document on Friday.  This chaplain is available for F/U spiritual care as needed.

## 2020-07-12 NOTE — Evaluation (Addendum)
Physical Therapy Evaluation Patient Details Name: [name] MRN: [id number] DOB: April 14, 1940 Today's Date: 07/12/2020   History of Present Illness  Pt is a 80 y/o female presenting on 6/8 with diarrhea and generalized weakness x 1 week.  Chest xray reveals stable cardiomegaly and vascular congestion, CT abdomen/pelvis and head negative for acute abnormalities. Admitted with AKI and generalized weakness.  PMH includes: chronic afib, chronic diastolic CHF, chronic resp failure with hypoxia on 2L via [place], T2DM, HTN, OSA/OHS on bipap.  Clinical Impression  Pt demonstrates deficits in overall strength, endurance, power, balance, and activity tolerance and is limited in functional mobility secondary to these impairments. Pt tolerates bed mobility and transfers, requiring physical assistance to perform. Pt reports that she has been primarily mobilizing with a wheelchair at [facility], reporting that she is able to propel the wheelchair but requires assistance to transfer. Prior to SNF placement in mid-March, pt reports mobilizing with RW. Pt will benefit from acute PT to improve independence in functional mobility. SPT recommends SNF placement to address physical assistance needs the pt currently requires and to assist in return to independence in mobility.     Follow Up Recommendations SNF    Equipment Recommendations  None recommended by PT    Recommendations for Other Services       Precautions / Restrictions Precautions Precautions: Fall Restrictions Weight Bearing Restrictions: No      Mobility  Bed Mobility Overal bed mobility: Needs Assistance Bed Mobility: Sit to Supine     Supine to sit: HOB elevated;Min guard (with heavy use of railings.) Sit to supine: Mod assist   General bed mobility comments: for L LE mgmt back to supine, scooting towards HOB    Transfers Overall transfer level: Needs assistance Equipment used: 2 person hand held assist Transfers: Sit to/from  [employer] Sit to Stand: Mod assist;+2 physical assistance;+2 safety/equipment Stand pivot transfers: Mod assist;+2 physical assistance;+2 safety/equipment       General transfer comment: pt seated in recliner with PT upon entry, attempted use of stedy to transfer back to bed but unsuccessful.  Utilized mod assist +2 to power up and pivot to EOB.  Ambulation/Gait                Stairs            Wheelchair Mobility    Modified Rankin (Stroke Patients Only)       Balance Overall balance assessment: Needs assistance Sitting-balance support: No upper extremity supported;Feet supported Sitting balance-Leahy Scale: Fair     Standing balance support: Bilateral upper extremity supported;During functional activity Standing balance-Leahy Scale: Poor Standing balance comment: relies on BUE and external support                             Pertinent Vitals/Pain Pain Assessment: Faces Faces Pain Scale: Hurts little more Pain Location: B hands (gout) Pain Descriptors / Indicators: Discomfort Pain Intervention(s): Limited activity within patient's tolerance;Monitored during session;Repositioned    Home Living Family/patient expects to be discharged to:: Skilled nursing facility                 Additional Comments: [facility]    Prior Function Level of Independence: Needs assistance   Gait / Transfers Assistance Needed: Pt uses WC for mobility at SNF. Pt was using RW for mobility 3 months ago, prior to SNF placement. Pt reports needing assistance to transfer to w/c. Pt reports PT assists with  transfer to w/c 3x per week.  ADL's / Homemaking Assistance Needed: reports needing assist for ADLs, using briefs for toileting needsd and staff assists with bathing/dressing        Hand Dominance        Extremity/Trunk Assessment   Upper Extremity Assessment Upper Extremity Assessment: Generalized weakness (chronic R shoulder pain,  B gout in hands)    Lower Extremity Assessment Lower Extremity Assessment: Defer to PT evaluation       Communication   Communication: No difficulties  Cognition Arousal/Alertness: Awake/alert Behavior During Therapy: WFL for tasks assessed/performed Overall Cognitive Status: No family/caregiver present to determine baseline cognitive functioning Area of Impairment: Problem solving;Awareness;Safety/judgement;Following commands                       Following Commands: Follows one step commands consistently;Follows one step commands with increased time;Follows multi-step commands inconsistently Safety/Judgement: Decreased awareness of safety;Decreased awareness of deficits Awareness: Emergent Problem Solving: Slow processing;Requires verbal cues General Comments: pt reports feeling "in a fog" but better than when first admitted.  requires increased time to processing and problem solving, following commands but pleasant and cooperative      General Comments General comments (skin integrity, edema, etc.): pt on 1L during session, VSS    Exercises General Exercises - Lower Extremity Long Arc Quad: Right;Left;Both;10 reps   Assessment/Plan    PT Assessment Patient needs continued PT services  PT Problem List Decreased strength;Decreased activity tolerance;Decreased balance;Decreased mobility;Decreased safety awareness;Decreased knowledge of use of DME;Decreased knowledge of precautions       PT Treatment Interventions DME instruction;Gait training;Functional mobility training;Therapeutic activities;Therapeutic exercise;Balance training;Patient/family education    PT Goals (Current goals can be found in the Care Plan section)  Acute Rehab PT Goals Patient Stated Goal: to get stronger, be able to get to the bathroom PT Goal Formulation: With patient Time For Goal Achievement: 07/26/20 Potential to Achieve Goals: Good Additional Goals Additional Goal #1: Pt will  mobilize in manual wheelchair for >50' at a supervision level.    Frequency Min 2X/week   Barriers to discharge        Co-evaluation PT/OT/SLP Co-Evaluation/Treatment: Yes Reason for Co-Treatment: For patient/therapist safety;To address functional/ADL transfers PT goals addressed during session: Mobility/safety with mobility OT goals addressed during session: ADL's and self-care       AM-PAC PT "6 Clicks" Mobility  Outcome Measure Help needed turning from your back to your side while in a flat bed without using bedrails?: A Little Help needed moving from lying on your back to sitting on the side of a flat bed without using bedrails?: A Little Help needed moving to and from a bed to a chair (including a wheelchair)?: A Lot Help needed standing up from a chair using your arms (e.g., wheelchair or bedside chair)?: A Lot Help needed to walk in hospital room?: Total Help needed climbing 3-5 steps with a railing? : Total 6 Click Score: 12    End of Session Equipment Utilized During Treatment: Gait belt;Oxygen Activity Tolerance: Patient tolerated treatment well Patient left: in bed;Other (comment) (Pt left with OT at EOB) Nurse Communication: Mobility status PT Visit Diagnosis: Unsteadiness on feet (R26.81);Other abnormalities of gait and mobility (R26.89);Muscle weakness (generalized) (M62.81);Difficulty in walking, not elsewhere classified (R26.2)    Time: 0254-2706 PT Time Calculation (min) (ACUTE ONLY): 34 min   Charges:   PT Evaluation $PT Eval Low Complexity: 1 Low          Acute Rehab  Pager: [phone]   Waldemar Dickens, SPT  07/12/2020, 12:18 PM

## 2020-07-12 NOTE — Progress Notes (Signed)
[facility]  PROGRESS NOTE  [name] ZOX:[id number] DOB: 06-27-1940 DOA: 07/11/2020 PCP: Gracelyn Nurse, MD   Brief HPI:   80 year old female with medical history of chronic atrial fibrillation on Eliquis s/p CRT-D, chronic diastolic CHF, chronic respiratory failure with hypoxemia on 2 L of oxygen via nasal cannula, diabetes mellitus type 2, hypertension, hyperlipidemia, OSA/OHS on BiPAP presented to ED for evaluation of diarrhea and generalized weakness.  In the ED, lab work revealed acute kidney injury with creatinine 1.86, BUN 15.  Diarrhea resolved at skilled facility.    Subjective   Patient seen and examined, denies nausea, vomiting, diarrhea.   Assessment/Plan:     Acute kidney injury -Creatinine at baseline 0.66 as of March 2022 -Likely from diarrhea and poor p.o. intake -Started on IV normal saline, renal function is improving; today creatinine is 1.70.  Not at baseline. -We will change IV fluids to LR at 100 mL/h -Avoid nephrotoxins, continue to hold metformin, Lasix, quinapril -Follow BMP in am   Diarrhea -Resolved, unclear etiology. -We will continue to monitor.   Elevated troponin -Troponin was elevated 887, 624, 369, 361 -Slowly improving, likely demand ischemia from hypotension and volume depletion -EKG showed paced rhythm -Denies chest pain   Transaminitis -Likely from hypoperfusion, from volume depletion -Rosuvastatin is on hold   Chronic atrial fibrillation s/p CRT-D -Stable -Continue Coreg for rate control -Continue Eliquis for anticoagulation   Chronic respiratory failure with hypoxemia -Multifactorial from chronic diastolic CHF, OSA/OHS -Stable -Continue home O2 2 L/min via nasal cannula   Chronic diastolic CHF -Lasix on hold due to worsening renal function as above -No exacerbation noted at this time   Hypertension -Continue Coreg -Continue to hold quinapril, Lasix  Diabetes mellitus type 2 -Continue to hold  metformin, Actos -Continue sliding scale insulin with NovoLog -CBG well controlled   Scheduled medications:    apixaban  5 mg Oral BID   carvedilol  12.5 mg Oral BID WC   diazepam  2 mg Oral BID   gabapentin  200 mg Oral TID   insulin aspart  0-9 Units Subcutaneous TID WC   sodium chloride flush  3 mL Intravenous Q12H         Data Reviewed:   CBG:  Recent Labs  Lab 07/11/20 2203 07/12/20 0559 07/12/20 1154  GLUCAP 104* 85 118*    SpO2: 93 % O2 Flow Rate (L/min): 2 L/min    Vitals:   07/12/20 0514 07/12/20 0600 07/12/20 0741 07/12/20 1153  BP:   (!) 123/54 (!) 126/43  Pulse:   68 70  Resp:   18 18  Temp:   97.7 F (36.5 C) (!) 97.5 F (36.4 C)  TempSrc:   Oral Oral  SpO2:  96% 96% 93%  Weight: 116 kg     Height:         Intake/Output Summary (Last 24 hours) at 07/12/2020 1205 Last data filed at 07/12/2020 0842 Gross per 24 hour  Intake 3360 ml  Output 550 ml  Net 2810 ml    06/07 1901 - 06/09 0700 In: 3000 [I.V.:3000] Out: 550 [Urine:550]  Filed Weights   07/11/20 1513 07/11/20 2011 07/12/20 0514  Weight: 112 kg 116 kg 116 kg    CBC:  Recent Labs  Lab 07/11/20 1515 07/11/20 1532 07/12/20 0021  WBC 9.1  --  6.9  HGB 8.6* 9.2* 8.3*  HCT 29.0* 27.0* 28.1*  PLT 177  --  145*  MCV 98.6  --  98.6  MCH  29.3  --  29.1  MCHC 29.7*  --  29.5*  RDW 14.6  --  14.7  LYMPHSABS 1.5  --   --   MONOABS 0.9  --   --   EOSABS 0.0  --   --   BASOSABS 0.0  --   --     Complete metabolic panel:  Recent Labs  Lab 07/11/20 1515 07/11/20 1527 07/11/20 1532 07/12/20 0021  NA 141  --  141 141  K 3.9  --  3.9 3.5  CL 98  --   --  101  CO2 34*  --   --  34*  GLUCOSE 143*  --   --  104*  BUN 15  --   --  16  CREATININE 1.86*  --   --  1.70*  CALCIUM 8.5*  --   --  7.9*  AST 416*  --   --  429*  ALT 272*  --   --  296*  ALKPHOS 47  --   --  45  BILITOT 0.8  --   --  0.7  ALBUMIN 3.1*  --   --  2.9*  LATICACIDVEN 1.9  --   --   --   INR 1.8*   --   --  1.7*  AMMONIA 39*  --   --   --   BNP  --  490.7*  --   --     No results for input(s): LIPASE, AMYLASE in the last 168 hours.  Recent Labs  Lab 07/11/20 1437 07/11/20 1527  BNP  --  490.7*  SARSCOV2NAA NEGATIVE  --     ------------------------------------------------------------------------------------------------------------------ No results for input(s): CHOL, HDL, LDLCALC, TRIG, CHOLHDL, LDLDIRECT in the last 72 hours.  Lab Results  Component Value Date   HGBA1C 5.8 (H) 04/12/2020   ------------------------------------------------------------------------------------------------------------------ No results for input(s): TSH, T4TOTAL, T3FREE, THYROIDAB in the last 72 hours.  Invalid input(s): FREET3 ------------------------------------------------------------------------------------------------------------------ No results for input(s): VITAMINB12, FOLATE, FERRITIN, TIBC, IRON, RETICCTPCT in the last 72 hours.  Coagulation profile Recent Labs  Lab 07/11/20 1515 07/12/20 0021  INR 1.8* 1.7*   No results for input(s): DDIMER in the last 72 hours.  Cardiac Enzymes No results for input(s): CKTOTAL, CKMB, CKMBINDEX, TROPONINI in the last 168 hours.  ------------------------------------------------------------------------------------------------------------------    Component Value Date/Time   BNP 490.7 (H) 07/11/2020 1527     Antibiotics: Anti-infectives (From admission, onward)    None        Radiology Reports  CT ABDOMEN PELVIS WO CONTRAST  Result Date: 07/11/2020 CLINICAL DATA:  Acute generalized abdominal pain. EXAM: CT ABDOMEN AND PELVIS WITHOUT CONTRAST TECHNIQUE: Multidetector CT imaging of the abdomen and pelvis was performed following the standard protocol without IV contrast. COMPARISON:  October 25, 2019. FINDINGS: Lower chest: No acute abnormality. Hepatobiliary: No focal liver abnormality is seen. Status post cholecystectomy. No biliary  dilatation. Pancreas: Unremarkable. No pancreatic ductal dilatation or surrounding inflammatory changes. Spleen: Calcified splenic granulomata are noted. Adrenals/Urinary Tract: Adrenal glands appear normal. Stable left renal cysts are noted. No hydronephrosis or renal obstruction is noted. No renal or ureteral calculi are noted. Urinary bladder is unremarkable. Stomach/Bowel: Stomach is within normal limits. No evidence of bowel wall thickening, distention, or inflammatory changes. Diverticulosis of descending and sigmoid colon is noted without inflammation. Vascular/Lymphatic: Aortic atherosclerosis. No enlarged abdominal or pelvic lymph nodes. Reproductive: Uterus and bilateral adnexa are unremarkable. Other: No abdominal wall hernia or abnormality. No abdominopelvic ascites. Musculoskeletal: No acute or  significant osseous findings. IMPRESSION: Diverticulosis of descending and sigmoid colon is noted without inflammation. No acute abnormality seen in the abdomen or pelvis. Aortic Atherosclerosis (ICD10-I70.0). Electronically Signed   By: Lupita Raider M.D.   On: 07/11/2020 16:11   CT Head Wo Contrast  Result Date: 07/11/2020 CLINICAL DATA:  Altered mental status.  Weakness. EXAM: CT HEAD WITHOUT CONTRAST TECHNIQUE: Contiguous axial images were obtained from the base of the skull through the vertex without intravenous contrast. COMPARISON:  04/12/2020 FINDINGS: Brain: Normal for age atrophy with mild chronic small vessel ischemia. No intracranial hemorrhage, mass effect, or midline shift. No hydrocephalus. The basilar cisterns are patent. No evidence of territorial infarct or acute ischemia. No extra-axial or intracranial fluid collection. Vascular: Atherosclerosis of skullbase vasculature without hyperdense vessel or abnormal calcification. Skull: No fracture or focal lesion.  Calvarial hyperostosis. Sinuses/Orbits: Improved paranasal sinus inflammation from prior exam. Minimal residual mucosal thickening  involving the left side of sphenoid sinus. Bilateral cataract resection. Mastoid air cells are clear. Other: None. IMPRESSION: 1. No acute intracranial abnormality. 2. Normal for age atrophy with mild chronic small vessel ischemia. 3. Improved paranasal sinus inflammation from prior exam. Electronically Signed   By: Narda Rutherford M.D.   On: 07/11/2020 16:06   DG Chest Port 1 View  Result Date: 07/11/2020 CLINICAL DATA:  Questionable sepsis - evaluate for abnormality Weakness and fever. EXAM: PORTABLE CHEST 1 VIEW COMPARISON:  Chest radiograph 04/12/2020 FINDINGS: Multi lead left-sided pacemaker in place. Stable cardiomegaly. Unchanged mediastinal contours with aortic atherosclerosis. Similar vascular congestion. No acute airspace disease. No large pleural effusion. No pneumothorax. No acute osseous abnormalities are seen. IMPRESSION: Stable cardiomegaly and vascular congestion. No acute findings. Electronically Signed   By: Narda Rutherford M.D.   On: 07/11/2020 15:20      DVT prophylaxis: Apixaban  Code Status: DNR  Family Communication: No family at bedside   Consultants:   Procedures:     Objective    Physical Examination:   General: Appears in no acute distress Cardiovascular: S1-S2, regular, no murmur auscultated Respiratory: Clear to auscultation bilaterally Abdomen: Abdomen is soft, nontender, no organomegaly Extremities: No edema in the lower extremities Neurologic: Alert, oriented x3, intact insight and judgment, no focal deficit noted   Status is: Inpatient  Dispo: The patient is from: Skilled nursing facility              Anticipated d/c is to: Skilled nursing facility              Anticipated d/c date is: 07/15/2020              Patient currently not stable for discharge  Barrier to discharge-ongoing treatment for acute kidney injury  COVID-19 Labs  No results for input(s): DDIMER, FERRITIN, LDH, CRP in the last 72 hours.  Lab Results  Component Value  Date   SARSCOV2NAA NEGATIVE 07/11/2020   SARSCOV2NAA NEGATIVE 04/16/2020   SARSCOV2NAA NEGATIVE 04/12/2020   SARSCOV2NAA NEGATIVE 10/25/2019    Microbiology  Recent Results (from the past 240 hour(s))  Resp Panel by RT-PCR (Flu A&B, Covid) Nasopharyngeal Swab     Status: None   Collection Time: 07/11/20  2:37 PM   Specimen: Nasopharyngeal Swab; Nasopharyngeal(NP) swabs in vial transport medium  Result Value Ref Range Status   SARS Coronavirus 2 by RT PCR NEGATIVE NEGATIVE Final    Comment: (NOTE) SARS-CoV-2 target nucleic acids are NOT DETECTED.  The SARS-CoV-2 RNA is generally detectable in upper respiratory specimens during the acute  phase of infection. The lowest concentration of SARS-CoV-2 viral copies this assay can detect is 138 copies/mL. A negative result does not preclude SARS-Cov-2 infection and should not be used as the sole basis for treatment or other patient management decisions. A negative result may occur with  improper specimen collection/handling, submission of specimen other than nasopharyngeal swab, presence of viral mutation(s) within the areas targeted by this assay, and inadequate number of viral copies(<138 copies/mL). A negative result must be combined with clinical observations, patient history, and epidemiological information. The expected result is Negative.  Fact Sheet for Patients:  BloggerCourse.comhttps://www.fda.gov/media/152166/download  Fact Sheet for Healthcare Providers:  SeriousBroker.ithttps://www.fda.gov/media/152162/download  This test is no t yet approved or cleared by the [place] FDA and  has been authorized for detection and/or diagnosis of SARS-CoV-2 by FDA under an Emergency Use Authorization (EUA). This EUA will remain  in effect (meaning this test can be used) for the duration of the COVID-19 declaration under Section 564(b)(1) of the Act, 21 U.S.C.section 360bbb-3(b)(1), unless the authorization is terminated  or revoked sooner.       Influenza A  by PCR NEGATIVE NEGATIVE Final   Influenza B by PCR NEGATIVE NEGATIVE Final    Comment: (NOTE) The Xpert Xpress SARS-CoV-2/FLU/RSV plus assay is intended as an aid in the diagnosis of influenza from Nasopharyngeal swab specimens and should not be used as a sole basis for treatment. Nasal washings and aspirates are unacceptable for Xpert Xpress SARS-CoV-2/FLU/RSV testing.  Fact Sheet for Patients: BloggerCourse.comhttps://www.fda.gov/media/152166/download  Fact Sheet for Healthcare Providers: SeriousBroker.ithttps://www.fda.gov/media/152162/download  This test is not yet approved or cleared by the [place] FDA and has been authorized for detection and/or diagnosis of SARS-CoV-2 by FDA under an Emergency Use Authorization (EUA). This EUA will remain in effect (meaning this test can be used) for the duration of the COVID-19 declaration under Section 564(b)(1) of the Act, 21 U.S.C. section 360bbb-3(b)(1), unless the authorization is terminated or revoked.  Performed at [facility], [address] [place], [place] [address]   Blood Culture (routine x 2)     Status: None (Preliminary result)   Collection Time: 07/11/20  3:15 PM   Specimen: BLOOD  Result Value Ref Range Status   Specimen Description BLOOD SITE NOT SPECIFIED  Final   Special Requests   Final    BOTTLES DRAWN AEROBIC AND ANAEROBIC Blood Culture adequate volume   Culture   Final    NO GROWTH < 24 HOURS Performed at [facility], [address] [place], [place] [address]    Report Status PENDING  Incomplete             Meredeth IdeGagan S Rage Beever   [facility] If 7PM-7AM, please contact night-coverage at [email or web address], Office  [phone]   07/12/2020, 12:05 PM  LOS: 0 days

## 2020-07-12 NOTE — [place] FL2 (Signed)
[place] MEDICAID FL2 LEVEL OF CARE SCREENING TOOL     IDENTIFICATION  Patient Name: [name] Birthdate: 1940/08/29 Sex: female Admission Date (Current Location): 07/11/2020  [facility] and [place] Number:  Producer, television/film/video and Address:  [facility], [address] [place], [place] [address]      Provider Number: [id number]  Attending Physician Name and Address:  Meredeth Ide, MD  Relative Name and Phone Number:  Fayrene Fearing [phone],Kathy [phone]    Current Level of Care: Hospital Recommended Level of Care: Skilled Nursing Facility Prior Approval Number:    Date Approved/Denied:   PASRR Number: [id number]  Discharge Plan: SNF    Current Diagnoses: Patient Active Problem List   Diagnosis Date Noted   AKI (acute kidney injury) (HCC) 07/12/2020   Acute kidney injury (HCC) 07/11/2020   Sleep apnea    Chronic atrial fibrillation (HCC)    Elevated troponin    Elevated LFTs    Chronic respiratory failure with hypoxia (HCC)    Type 2 diabetes mellitus (HCC)    Hypertension associated with diabetes (HCC)    Hyperlipidemia associated with type 2 diabetes mellitus (HCC)    Generalized weakness 04/12/2020   Obesity, Class III, BMI 40-49.9 (morbid obesity) (HCC) 04/12/2020   Obesity, diabetes, and hypertension syndrome (HCC) 04/12/2020   Metabolic syndrome 04/12/2020   Shortness of breath 04/12/2020   Overdose of coumadin, accidental or unintentional, initial encounter 07/29/2019   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   Piriformis syndrome 06/18/2014   Facet syndrome, lumbar 06/18/2014   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/2014   DDD (degenerative disc disease), lumbar 06/18/2014   DDD (degenerative disc disease), cervical 06/08/2014   DDD (degenerative disc disease), 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   Cervical spinal stenosis 04/09/2011    Orientation RESPIRATION BLADDER Height & Weight     Self, Time, Situation, Place  O2  (Nasal Cannula 2 liters) External catheter, Continent (External Urinary Catheter) Weight: 255 lb 11.7 oz (116 kg) Height:  5\' 3"  (160 cm)  BEHAVIORAL SYMPTOMS/MOOD NEUROLOGICAL BOWEL NUTRITION STATUS      Incontinent Diet (Please see discharge summary)  AMBULATORY STATUS COMMUNICATION OF NEEDS Skin   Limited Assist Verbally Normal (WDL)                       Personal Care Assistance Level of Assistance  Bathing, Feeding, Dressing Bathing Assistance: Limited assistance Feeding assistance: Independent Dressing Assistance: Limited assistance     Functional Limitations Info  Sight, Hearing, Speech Sight Info: Adequate Hearing Info: Adequate Speech Info: Adequate    SPECIAL CARE FACTORS FREQUENCY  PT (By licensed PT), OT (By licensed OT)     PT Frequency: 5x min weekly OT Frequency: 5x min weekly            Contractures Contractures Info: Not present    Additional Factors Info  Code Status, Allergies, Insulin Sliding Scale, Psychotropic Code Status Info: DNR Allergies Info: Ciprofloxacin Hcl,Dopamine,Sulfa Antibiotics,Sulfacetamide Sodium,Tegaderm Ag Mesh (silver) Psychotropic Info: diazepam (VALIUM) tablet 2 mg 2 times daily Insulin Sliding Scale Info: insulin aspart (novoLOG) injection 0-9 Units 3 times daily with meals       Current Medications (07/12/2020):  This is the current hospital active medication list Current Facility-Administered Medications  Medication Dose Route Frequency Provider Last Rate Last Admin   albuterol (VENTOLIN HFA) 108 (90 Base) MCG/ACT inhaler 2 puff  2 puff Inhalation Q4H PRN 09/11/2020, MD  apixaban (ELIQUIS) tablet 5 mg  5 mg Oral BID Darreld Mclean R, MD   5 mg at 07/12/20 1214   carvedilol (COREG) tablet 12.5 mg  12.5 mg Oral BID WC Darreld Mclean R, MD   12.5 mg at 07/12/20 1214   diazepam (VALIUM) tablet 2 mg  2 mg Oral BID Darreld Mclean R, MD   2 mg at 07/12/20 1214   gabapentin (NEURONTIN) capsule 200 mg  200 mg Oral TID  Darreld Mclean R, MD   200 mg at 07/12/20 1214   insulin aspart (novoLOG) injection 0-9 Units  0-9 Units Subcutaneous TID WC Charlsie Quest, MD       lactated ringers infusion   Intravenous Continuous Meredeth Ide, MD 75 mL/hr at 07/12/20 1228 New Bag at 07/12/20 1228   ondansetron (ZOFRAN) tablet 4 mg  4 mg Oral Q6H PRN Charlsie Quest, MD       Or   ondansetron (ZOFRAN) injection 4 mg  4 mg Intravenous Q6H PRN Darreld Mclean R, MD       sodium chloride flush (NS) 0.9 % injection 3 mL  3 mL Intravenous Q12H Charlsie Quest, MD   3 mL at 07/12/20 1233     Discharge Medications: Please see discharge summary for a list of discharge medications.  Relevant Imaging Results:  Relevant Lab Results:   Additional Information [id number]  Terrial Rhodes, LCSWA

## 2020-07-12 NOTE — TOC Initial Note (Addendum)
Transition of Care [facility]) - Initial/Assessment Note    Patient Details  Name: [name] MRN: [id number] Date of Birth: July 05, 1940  [facility] CM/SW Contact:    [name], LCSWA Phone Number: 07/12/2020, 3:08 PM  Clinical Narrative:                    CSW received consult for possible SNF placement at time of discharge. CSW spoke with patient and patients friend [name] at bedside regarding PT recommendation of SNF placement at time of discharge. Patient comes from [facility] long term. Patients dc plan is to return to [facility] when medically ready for dc.Patient has received the COVID vaccines as well as booster. Patient request for assist with HCPOA. CSW called and spoke with chaplin. Chaplin confirmed they will go see patient today.Patient and patients friend requested for CSW to have [facility] call patents friend [name] regarding medicaid status for patient.CSW spoke with [name] who confirmed she will have [name] in [facility] at [facility] to follow up with patients friend [name].No further questions reported at this time. CSW to continue to follow and assist with discharge planning needs.      Patient Goals and CMS Choice        Expected Discharge Plan and Services                                                Prior Living Arrangements/Services                       Activities of Daily Living      Permission Sought/Granted                  Emotional Assessment              Admission diagnosis:  Dehydration [E86.0] Transaminitis [R74.01] Elevated troponin I level [R77.8] Generalized weakness [R53.1] Acute kidney injury (HCC) [N17.9] AKI (acute kidney injury) (HCC) [N17.9] Patient Active Problem List   Diagnosis Date Noted   AKI (acute kidney injury) (HCC) 07/12/2020   Acute kidney injury (HCC) 07/11/2020   Sleep apnea    Chronic atrial fibrillation (HCC)    Elevated troponin    Elevated LFTs    Chronic  respiratory failure with hypoxia (HCC)    Type 2 diabetes mellitus (HCC)    Hypertension associated with diabetes (HCC)    Hyperlipidemia associated with type 2 diabetes mellitus (HCC)    Generalized weakness 04/12/2020   Obesity, Class III, BMI 40-49.9 (morbid obesity) (HCC) 04/12/2020   Obesity, diabetes, and hypertension syndrome (HCC) 04/12/2020   Metabolic syndrome 04/12/2020   Shortness of breath 04/12/2020   Overdose of coumadin, accidental or unintentional, initial encounter 07/29/2019   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 [place] [address] Phone: [phone] Fax: [phone]     Social Determinants of Health (SDOH) Interventions    Readmission Risk Interventions No flowsheet data found.

## 2020-07-12 NOTE — Evaluation (Signed)
Occupational Therapy Evaluation Patient Details Name: [name] MRN: [id number] DOB: [birth year] Today's Date: 07/12/2020    History of Present Illness Pt is a 80 y/o female presenting on 6/8 with diarrhea and generalized weakness x 1 week.  Chest xray reveals stable cardiomegaly and vascular congestion, CT abdomen/pelvis and head negative for acute abnormalities. Admitted with AKI and generalized weakness.  PMH includes: chronic afib, chronic diastolic CHF, chronic resp failure with hypoxia on 2L via [place], T2DM, HTN, OSA/OHS on bipap.   Clinical Impression   PTA patient reports needing assist for ADLs from staff at SNF.  Admitted for above and presenting with problem list below, including generalized weakness, impaired balance, decreased activity tolerance, and impaired cognition.  Pt voices "feeling in a fog", noting increased time for processing and problem solving.  She currently requires up to min assist for UB ADLs, total assist for LB ADLs and mod assist +2 for transfers. Believe she will best benefit from further OT services while admitted and after dc at SNF level to decreased burden of care and optimize independence with ADLs, mobility.  Will follow.     Follow Up Recommendations  SNF;Supervision/Assistance - 24 hour    Equipment Recommendations  None recommended by OT    Recommendations for Other Services       Precautions / Restrictions Precautions Precautions: Fall Restrictions Weight Bearing Restrictions: No      Mobility Bed Mobility Overal bed mobility: Needs Assistance Bed Mobility: Sit to Supine       Sit to supine: Mod assist   General bed mobility comments: for L LE mgmt back to supine, scooting towards HOB    Transfers Overall transfer level: Needs assistance Equipment used: 2 person hand held assist Transfers: Sit to/from [employer] Sit to Stand: Mod assist;+2 physical assistance;+2 safety/equipment Stand pivot transfers: Mod  assist;+2 physical assistance;+2 safety/equipment       General transfer comment: pt seated in recliner with PT upon entry, attempted use of stedy to transfer back to bed but unsuccessful.  Utilized mod assist +2 to power up and pivot to EOB.    Balance Overall balance assessment: Needs assistance Sitting-balance support: No upper extremity supported;Feet supported Sitting balance-Leahy Scale: Fair     Standing balance support: Bilateral upper extremity supported;During functional activity Standing balance-Leahy Scale: Poor Standing balance comment: relies on BUE and external support                           ADL either performed or assessed with clinical judgement   ADL Overall ADL's : Needs assistance/impaired     Grooming: Minimal assistance;Sitting   Upper Body Bathing: Minimal assistance;Sitting   Lower Body Bathing: Total assistance;+2 for physical assistance;+2 for safety/equipment;Sit to/from stand   Upper Body Dressing : Minimal assistance;Sitting   Lower Body Dressing: Total assistance;+2 for physical assistance;+2 for safety/equipment;Sit to/from stand   Toilet Transfer: Moderate assistance;+2 for physical assistance;+2 for safety/equipment;Stand-pivot Toilet Transfer Details (indicate cue type and reason): simulated from recliner         Functional mobility during ADLs: Moderate assistance;Cueing for safety;Cueing for sequencing;+2 for physical assistance;+2 for safety/equipment General ADL Comments: pt limited by weakness, impaired balance, decreased activity tolerance     Vision   Vision Assessment?: No apparent visual deficits     Perception     Praxis      Pertinent Vitals/Pain Pain Assessment: Faces Faces Pain Scale: Hurts little more Pain Location: B hands (gout)  Pain Descriptors / Indicators: Discomfort Pain Intervention(s): Limited activity within patient's tolerance;Monitored during session;Repositioned     Hand Dominance      Extremity/Trunk Assessment Upper Extremity Assessment Upper Extremity Assessment: Generalized weakness (chronic R shoulder pain, B gout in hands)   Lower Extremity Assessment Lower Extremity Assessment: Defer to PT evaluation       Communication Communication Communication: No difficulties   Cognition Arousal/Alertness: Awake/alert Behavior During Therapy: WFL for tasks assessed/performed Overall Cognitive Status: No family/caregiver present to determine baseline cognitive functioning Area of Impairment: Problem solving;Awareness;Safety/judgement;Following commands                       Following Commands: Follows one step commands consistently;Follows one step commands with increased time;Follows multi-step commands inconsistently Safety/Judgement: Decreased awareness of safety;Decreased awareness of deficits Awareness: Emergent Problem Solving: Slow processing;Requires verbal cues General Comments: pt reports feeling "in a fog" but better than when first admitted.  requires increased time to processing and problem solving, following commands but pleasant and cooperative   General Comments  pt on 1L during session, VSS    Exercises     Shoulder Instructions      Home Living Family/patient expects to be discharged to:: Skilled nursing facility                                 Additional Comments: [facility]      Prior Functioning/Environment Level of Independence: Needs assistance    ADL's / Homemaking Assistance Needed: reports needing assist for ADLs, using briefs for toileting needsd and staff assists with bathing/dressing            OT Problem List: Decreased strength;Decreased activity tolerance;Impaired balance (sitting and/or standing);Decreased cognition;Decreased safety awareness;Decreased knowledge of use of DME or AE;Decreased knowledge of precautions;Pain;Obesity      OT Treatment/Interventions: Self-care/ADL  training;Therapeutic exercise;DME and/or AE instruction;Therapeutic activities;Cognitive remediation/compensation;Balance training;Patient/family education    OT Goals(Current goals can be found in the care plan section) Acute Rehab OT Goals Patient Stated Goal: to get stronger, be able to get to the bathroom OT Goal Formulation: With patient Time For Goal Achievement: 07/26/20 Potential to Achieve Goals: Good ADL Goals Pt Will Perform Grooming: with supervision;with set-up;sitting Pt Will Perform Lower Body Dressing: with max assist;sitting/lateral leans;with adaptive equipment Pt Will Transfer to Toilet: with mod assist;stand pivot transfer;squat pivot transfer;bedside commode;with transfer board Pt Will Perform Toileting - Clothing Manipulation and hygiene: with max assist;sitting/lateral leans;sit to/from stand Pt/caregiver will Perform Home Exercise Program: Increased strength;Both right and left upper extremity;With written HEP provided  OT Frequency: Min 2X/week   Barriers to D/C:            Co-evaluation PT/OT/SLP Co-Evaluation/Treatment: Yes Reason for Co-Treatment: For patient/therapist safety;To address functional/ADL transfers   OT goals addressed during session: ADL's and self-care      AM-PAC OT "6 Clicks" Daily Activity     Outcome Measure Help from another person eating meals?: A Little Help from another person taking care of personal grooming?: A Little Help from another person toileting, which includes using toliet, bedpan, or urinal?: Total Help from another person bathing (including washing, rinsing, drying)?: A Lot Help from another person to put on and taking off regular upper body clothing?: A Little Help from another person to put on and taking off regular lower body clothing?: Total 6 Click Score: 13   End of Session Equipment Utilized During Treatment:  Oxygen Nurse Communication: Mobility status  Activity Tolerance: Patient tolerated treatment  well Patient left: in bed;with call bell/phone within reach;with bed alarm set;with nursing/sitter in room  OT Visit Diagnosis: Other abnormalities of gait and mobility (R26.89);Muscle weakness (generalized) (M62.81);Pain Pain - part of body:  (hands)                Time: 7654-6503 OT Time Calculation (min): 21 min Charges:  OT General Charges $OT Visit: 1 Visit OT Evaluation $OT Eval Moderate Complexity: 1 Mod  Barry Brunner, OT Acute Rehabilitation Services Pager [phone] Office [phone]   [name] 07/12/2020, 9:59 AM

## 2020-07-13 LAB — COMPREHENSIVE METABOLIC PANEL
ALT: 258 U/L — ABNORMAL HIGH (ref 0–44)
AST: 220 U/L — ABNORMAL HIGH (ref 15–41)
Albumin: 3.1 g/dL — ABNORMAL LOW (ref 3.5–5.0)
Alkaline Phosphatase: 47 U/L (ref 38–126)
Anion gap: 9 (ref 5–15)
BUN: 14 mg/dL (ref 8–23)
CO2: 33 mmol/L — ABNORMAL HIGH (ref 22–32)
Calcium: 8.6 mg/dL — ABNORMAL LOW (ref 8.9–10.3)
Chloride: 100 mmol/L (ref 98–111)
Creatinine, Ser: 1 mg/dL (ref 0.44–1.00)
GFR, Estimated: 58 mL/min — ABNORMAL LOW (ref >60)
Glucose, Bld: 91 mg/dL (ref 70–99)
Potassium: 3.5 mmol/L (ref 3.5–5.1)
Sodium: 142 mmol/L (ref 135–145)
Total Bilirubin: 0.6 mg/dL (ref 0.3–1.2)
Total Protein: 6.1 g/dL — ABNORMAL LOW (ref 6.5–8.1)

## 2020-07-13 LAB — RESP PANEL BY RT-PCR (FLU A&B, COVID) ARPGX2
Influenza A by PCR: NEGATIVE
Influenza B by PCR: NEGATIVE
SARS Coronavirus 2 by RT PCR: NEGATIVE

## 2020-07-13 LAB — HEMOGLOBIN A1C
Hgb A1c MFr Bld: 5.2 % (ref 4.8–5.6)
Mean Plasma Glucose: 103 mg/dL

## 2020-07-13 LAB — CBC
HCT: 29.5 % — ABNORMAL LOW (ref 36.0–46.0)
Hemoglobin: 8.7 g/dL — ABNORMAL LOW (ref 12.0–15.0)
MCH: 29.1 pg (ref 26.0–34.0)
MCHC: 29.5 g/dL — ABNORMAL LOW (ref 30.0–36.0)
MCV: 98.7 fL (ref 80.0–100.0)
Platelets: 160 10*3/uL (ref 150–400)
RBC: 2.99 MIL/uL — ABNORMAL LOW (ref 3.87–5.11)
RDW: 14.7 % (ref 11.5–15.5)
WBC: 6.7 10*3/uL (ref 4.0–10.5)
nRBC: 0 % (ref 0.0–0.2)

## 2020-07-13 LAB — GLUCOSE, CAPILLARY
Glucose-Capillary: 96 mg/dL (ref 70–99)
Glucose-Capillary: 144 mg/dL — ABNORMAL HIGH (ref 70–99)
Glucose-Capillary: 90 mg/dL (ref 70–99)

## 2020-07-13 MED ORDER — DIAZEPAM 2 MG PO TABS: 2.0000 mg | ORAL_TABLET | Freq: Two times a day (BID) | ORAL | 0 refills | Status: DC

## 2020-07-13 NOTE — Progress Notes (Signed)
RN called 3 times to [facility] to give report ([phone] room [phone]) no one answer, and leave message for call back.

## 2020-07-13 NOTE — Progress Notes (Signed)
   07/13/20 1327  Clinical Encounter Type  Visited With Patient and family together  Visit Type Follow-up  Referral From Nurse  Consult/Referral To Chaplain  Spiritual Encounters  Spiritual Needs Emotional;Ritual (communion)  Stress Factors  Patient Stress Factors Health changes;Major life changes   Chaplain responded to page for AD. Chaplain provided AD education to Pt and Pt's friend, [name], at bedside. Pt and [name] are going to discuss things further. Chaplain let them know that there are no notaries available today. Pt also requested communion. Chaplain will check with Dept's ordained chaplains to see if one can come and provide communion. Chaplain engaged active listening and provided emotional support. Pt has a lot of change and decisions going on in her life right now. Chaplain remains available.   This note was prepared by Chaplain Resident, Tacy Learn, MDiv. Chaplain remains available as needed through the on-call pager: [phone].

## 2020-07-13 NOTE — Discharge Summary (Signed)
Physician Discharge Summary  [name] [id number] DOB: 12/09/1940 DOA: 07/11/2020  PCP: Gracelyn NurseJohnston, John D, MD  Admit date: 07/11/2020 Discharge date: 07/13/2020  Time spent: 50 minutes  Recommendations for Outpatient Follow-up:  Patient will need BMP in 1 week Follow LFTs in 1 week ACE inhibitor on hold, consider restarting it if blood pressure rises Hold rosuvastatin for 1 week; recheck LFTs in 1 week.  Consider restarting if LFTs are normal    Discharge Diagnoses:  Principal Problem:   Acute kidney injury [facility](HCC) Active Problems:   Generalized weakness   Sleep apnea   Chronic atrial fibrillation (HCC)   Elevated troponin   Elevated LFTs   Chronic respiratory failure with hypoxia (HCC)   Type 2 diabetes mellitus (HCC)   Hypertension associated with diabetes (HCC)   Hyperlipidemia associated with type 2 diabetes mellitus (HCC)   AKI (acute kidney injury) (HCC)   Discharge Condition: Stable  Diet recommendation: Heart healthy diet  Filed Weights   07/11/20 1513 07/11/20 2011 07/12/20 0514  Weight: 112 kg 116 kg 116 kg    History of present illness:  80 year old female with medical history of chronic atrial fibrillation on Eliquis s/p CRT-D, chronic diastolic CHF, chronic respiratory failure with hypoxemia on 2 L of oxygen via nasal cannula, diabetes mellitus type 2, hypertension, hyperlipidemia, OSA/OHS on BiPAP presented to ED for evaluation of diarrhea and generalized weakness.   In the ED, lab work revealed acute kidney injury with creatinine 1.86, BUN 15.  Diarrhea resolved at skilled facility  Hospital Course:   Acute kidney injury -Resolved -Creatinine at baseline 0.66 as of March 2022 -Likely from diarrhea and poor p.o. intake -Started on IV normal saline, renal function is improving; today creatinine is 1.0 -We will restart Lasix but discontinue quinapril     Diarrhea -Resolved, unclear etiology.      Elevated troponin -Troponin was elevated 887,  624, 369, 361 -Slowly improving, likely demand ischemia from hypotension and volume depletion -EKG showed paced rhythm -Denies chest pain     Transaminitis -Likely from hypoperfusion, from volume depletion -LFTs are improving, today AST and ALT down to 220/258 -Consider repeating LFTs in 1 week -Hold rosuvastatin for 1 week; consider restarting if LFTs come back to normal     Chronic atrial fibrillation s/p CRT-D -Stable -Continue Coreg for rate control -Continue Eliquis for anticoagulation     Chronic respiratory failure with hypoxemia -Multifactorial from chronic diastolic CHF, OSA/OHS -Stable -Continue home O2 2 L/min via nasal cannula     Chronic diastolic CHF -We will restart Lasix at 10 mg daily    Hypertension -Continue Coreg -Continue to hold quinapril   Diabetes mellitus type 2 -Continue metformin, Actos   Procedures:   Consultations:   Discharge Exam: Vitals:   07/13/20 0537 07/13/20 1117  BP: 136/61 (!) 115/55  Pulse: 70 71  Resp: 18 18  Temp: 97.9 F (36.6 C) 98.1 F (36.7 C)  SpO2: 100% 98%    General: Appears in no acute distress Cardiovascular: S1-S2, regular Respiratory: Clear to auscultation bilaterally  Discharge Instructions   Discharge Instructions     Diet - low sodium heart healthy   Complete by: As directed    Increase activity slowly   Complete by: As directed       Allergies as of 07/13/2020       Reactions   Ciprofloxacin Hcl Itching   Rash and itching all over stomach and arms   Dopamine Nausea And Vomiting   Sulfa Antibiotics  Rash   Sulfacetamide Sodium Rash   Tegaderm Ag Mesh [silver] Other (See Comments), Rash   Blisters and takes skin off        Medication List     STOP taking these medications    quinapril 10 MG tablet Commonly known as: ACCUPRIL       TAKE these medications    albuterol 108 (90 Base) MCG/ACT inhaler Commonly known as: VENTOLIN HFA Inhale 2 puffs into the lungs every 4  (four) hours as needed for wheezing or shortness of breath.   alendronate 70 MG tablet Commonly known as: FOSAMAX Take 70 mg by mouth every Monday.   benzonatate 200 MG capsule Commonly known as: TESSALON Take 200 mg by mouth 3 (three) times daily as needed for cough.   carvedilol 12.5 MG tablet Commonly known as: COREG Take 12.5 mg by mouth 2 (two) times daily with a meal.   Cholecalciferol 25 MCG (1000 UT) tablet Take 1,000 Units by mouth daily.   Cyanocobalamin 1000 MCG Tbcr Take 1,000 mcg by mouth daily.   diazepam 2 MG tablet Commonly known as: VALIUM Take 1 tablet (2 mg total) by mouth 2 (two) times daily.   diclofenac Sodium 1 % Gel Commonly known as: VOLTAREN Apply 2 g topically daily as needed (for pain).   Eliquis 5 MG Tabs tablet Generic drug: apixaban Take 5 mg by mouth 2 (two) times daily.   furosemide 20 MG tablet Commonly known as: LASIX Take 10 mg by mouth daily.   gabapentin 100 MG capsule Commonly known as: NEURONTIN Take 200 mg by mouth 3 (three) times daily.   meclizine 25 MG tablet Commonly known as: ANTIVERT Take 25 mg by mouth 3 (three) times daily as needed for dizziness.   metFORMIN 500 MG tablet Commonly known as: GLUCOPHAGE Take 500 mg by mouth 2 (two) times daily with a meal.   multivitamin with minerals tablet Take 1 tablet by mouth daily.   omeprazole 20 MG capsule Commonly known as: PRILOSEC Take 20 mg by mouth daily.   pioglitazone 15 MG tablet Commonly known as: ACTOS Take 15 mg by mouth daily.   polyethylene glycol 17 g packet Commonly known as: MIRALAX / GLYCOLAX Take 17 g by mouth daily.   rosuvastatin 10 MG tablet Commonly known as: CRESTOR Take 10 mg by mouth daily.   sertraline 100 MG tablet Commonly known as: ZOLOFT Take 100 mg by mouth daily.       Allergies  Allergen Reactions   Ciprofloxacin Hcl Itching    Rash and itching all over stomach and arms   Dopamine Nausea And Vomiting   Sulfa  Antibiotics Rash   Sulfacetamide Sodium Rash   Tegaderm Ag Mesh [Silver] Other (See Comments) and Rash    Blisters and takes skin off      The results of significant diagnostics from this hospitalization (including imaging, microbiology, ancillary and laboratory) are listed below for reference.    Significant Diagnostic Studies: CT ABDOMEN PELVIS WO CONTRAST  Result Date: 07/11/2020 CLINICAL DATA:  Acute generalized abdominal pain. EXAM: CT ABDOMEN AND PELVIS WITHOUT CONTRAST TECHNIQUE: Multidetector CT imaging of the abdomen and pelvis was performed following the standard protocol without IV contrast. COMPARISON:  October 25, 2019. FINDINGS: Lower chest: No acute abnormality. Hepatobiliary: No focal liver abnormality is seen. Status post cholecystectomy. No biliary dilatation. Pancreas: Unremarkable. No pancreatic ductal dilatation or surrounding inflammatory changes. Spleen: Calcified splenic granulomata are noted. Adrenals/Urinary Tract: Adrenal glands appear normal. Stable left renal cysts  are noted. No hydronephrosis or renal obstruction is noted. No renal or ureteral calculi are noted. Urinary bladder is unremarkable. Stomach/Bowel: Stomach is within normal limits. No evidence of bowel wall thickening, distention, or inflammatory changes. Diverticulosis of descending and sigmoid colon is noted without inflammation. Vascular/Lymphatic: Aortic atherosclerosis. No enlarged abdominal or pelvic lymph nodes. Reproductive: Uterus and bilateral adnexa are unremarkable. Other: No abdominal wall hernia or abnormality. No abdominopelvic ascites. Musculoskeletal: No acute or significant osseous findings. IMPRESSION: Diverticulosis of descending and sigmoid colon is noted without inflammation. No acute abnormality seen in the abdomen or pelvis. Aortic Atherosclerosis (ICD10-I70.0). Electronically Signed   By: Lupita Raider M.D.   On: 07/11/2020 16:11   CT Head Wo Contrast  Result Date:  07/11/2020 CLINICAL DATA:  Altered mental status.  Weakness. EXAM: CT HEAD WITHOUT CONTRAST TECHNIQUE: Contiguous axial images were obtained from the base of the skull through the vertex without intravenous contrast. COMPARISON:  04/12/2020 FINDINGS: Brain: Normal for age atrophy with mild chronic small vessel ischemia. No intracranial hemorrhage, mass effect, or midline shift. No hydrocephalus. The basilar cisterns are patent. No evidence of territorial infarct or acute ischemia. No extra-axial or intracranial fluid collection. Vascular: Atherosclerosis of skullbase vasculature without hyperdense vessel or abnormal calcification. Skull: No fracture or focal lesion.  Calvarial hyperostosis. Sinuses/Orbits: Improved paranasal sinus inflammation from prior exam. Minimal residual mucosal thickening involving the left side of sphenoid sinus. Bilateral cataract resection. Mastoid air cells are clear. Other: None. IMPRESSION: 1. No acute intracranial abnormality. 2. Normal for age atrophy with mild chronic small vessel ischemia. 3. Improved paranasal sinus inflammation from prior exam. Electronically Signed   By: Narda Rutherford M.D.   On: 07/11/2020 16:06   DG Chest Port 1 View  Result Date: 07/11/2020 CLINICAL DATA:  Questionable sepsis - evaluate for abnormality Weakness and fever. EXAM: PORTABLE CHEST 1 VIEW COMPARISON:  Chest radiograph 04/12/2020 FINDINGS: Multi lead left-sided pacemaker in place. Stable cardiomegaly. Unchanged mediastinal contours with aortic atherosclerosis. Similar vascular congestion. No acute airspace disease. No large pleural effusion. No pneumothorax. No acute osseous abnormalities are seen. IMPRESSION: Stable cardiomegaly and vascular congestion. No acute findings. Electronically Signed   By: Narda Rutherford M.D.   On: 07/11/2020 15:20    Microbiology: Recent Results (from the past 240 hour(s))  Resp Panel by RT-PCR (Flu A&B, Covid) Nasopharyngeal Swab     Status: None   Collection  Time: 07/11/20  2:37 PM   Specimen: Nasopharyngeal Swab; Nasopharyngeal(NP) swabs in vial transport medium  Result Value Ref Range Status   SARS Coronavirus 2 by RT PCR NEGATIVE NEGATIVE Final    Comment: (NOTE) SARS-CoV-2 target nucleic acids are NOT DETECTED.  The SARS-CoV-2 RNA is generally detectable in upper respiratory specimens during the acute phase of infection. The lowest concentration of SARS-CoV-2 viral copies this assay can detect is 138 copies/mL. A negative result does not preclude SARS-Cov-2 infection and should not be used as the sole basis for treatment or other patient management decisions. A negative result may occur with  improper specimen collection/handling, submission of specimen other than nasopharyngeal swab, presence of viral mutation(s) within the areas targeted by this assay, and inadequate number of viral copies(<138 copies/mL). A negative result must be combined with clinical observations, patient history, and epidemiological information. The expected result is Negative.  Fact Sheet for Patients:  BloggerCourse.com  Fact Sheet for Healthcare Providers:  SeriousBroker.it  This test is no t yet approved or cleared by the [place] FDA and  has  been authorized for detection and/or diagnosis of SARS-CoV-2 by FDA under an Emergency Use Authorization (EUA). This EUA will remain  in effect (meaning this test can be used) for the duration of the COVID-19 declaration under Section 564(b)(1) of the Act, 21 U.S.C.section 360bbb-3(b)(1), unless the authorization is terminated  or revoked sooner.       Influenza A by PCR NEGATIVE NEGATIVE Final   Influenza B by PCR NEGATIVE NEGATIVE Final    Comment: (NOTE) The Xpert Xpress SARS-CoV-2/FLU/RSV plus assay is intended as an aid in the diagnosis of influenza from Nasopharyngeal swab specimens and should not be used as a sole basis for treatment. Nasal washings  and aspirates are unacceptable for Xpert Xpress SARS-CoV-2/FLU/RSV testing.  Fact Sheet for Patients: BloggerCourse.com  Fact Sheet for Healthcare Providers: SeriousBroker.it  This test is not yet approved or cleared by the [place] FDA and has been authorized for detection and/or diagnosis of SARS-CoV-2 by FDA under an Emergency Use Authorization (EUA). This EUA will remain in effect (meaning this test can be used) for the duration of the COVID-19 declaration under Section 564(b)(1) of the Act, 21 U.S.C. section 360bbb-3(b)(1), unless the authorization is terminated or revoked.  Performed at [facility], [address] [place], [place] [address]   Blood Culture (routine x 2)     Status: None (Preliminary result)   Collection Time: 07/11/20  3:15 PM   Specimen: BLOOD  Result Value Ref Range Status   Specimen Description BLOOD SITE NOT SPECIFIED  Final   Special Requests   Final    BOTTLES DRAWN AEROBIC AND ANAEROBIC Blood Culture adequate volume   Culture   Final    NO GROWTH 2 DAYS Performed at [facility], [address], [place], [place] [address]    Report Status PENDING  Incomplete  Blood Culture (routine x 2)     Status: None (Preliminary result)   Collection Time: 07/11/20  8:33 PM   Specimen: BLOOD  Result Value Ref Range Status   Specimen Description BLOOD RIGHT ANTECUBITAL  Final   Special Requests   Final    BOTTLES DRAWN AEROBIC AND ANAEROBIC Blood Culture results may not be optimal due to an inadequate volume of blood received in culture bottles   Culture   Final    NO GROWTH 1 DAY Performed at [facility], [address], [place], [place] [address]    Report Status PENDING  Incomplete     Labs: Basic Metabolic Panel: Recent Labs  Lab 07/11/20 1515 07/11/20 1532 07/12/20 0021 07/13/20 0345  NA 141 141 141 142  K 3.9 3.9 3.5 3.5  CL 98  --  101 100  CO2 34*  --  34* 33*   GLUCOSE 143*  --  104* 91  BUN 15  --  16 14  CREATININE 1.86*  --  1.70* 1.00  CALCIUM 8.5*  --  7.9* 8.6*   Liver Function Tests: Recent Labs  Lab 07/11/20 1515 07/12/20 0021 07/13/20 0345  AST 416* 429* 220*  ALT 272* 296* 258*  ALKPHOS 47 45 47  BILITOT 0.8 0.7 0.6  PROT 6.1* 5.5* 6.1*  ALBUMIN 3.1* 2.9* 3.1*   No results for input(s): LIPASE, AMYLASE in the last 168 hours. Recent Labs  Lab 07/11/20 1515  AMMONIA 39*   CBC: Recent Labs  Lab 07/11/20 1515 07/11/20 1532 07/12/20 0021 07/13/20 0345  WBC 9.1  --  6.9 6.7  NEUTROABS 6.7  --   --   --  HGB 8.6* 9.2* 8.3* 8.7*  HCT 29.0* 27.0* 28.1* 29.5*  MCV 98.6  --  98.6 98.7  PLT 177  --  145* 160   Cardiac Enzymes: No results for input(s): CKTOTAL, CKMB, CKMBINDEX, TROPONINI in the last 168 hours. BNP: BNP (last 3 results) Recent Labs    10/25/19 0320 04/12/20 2303 07/11/20 1527  BNP 186.5* 111.8* 490.7*    ProBNP (last 3 results) No results for input(s): PROBNP in the last 8760 hours.  CBG: Recent Labs  Lab 07/12/20 1154 07/12/20 1548 07/12/20 2113 07/13/20 0537 07/13/20 1113  GLUCAP 118* 132* 93 96 144*       Signed:  Meredeth Ide MD.  [facility] 07/13/2020, 2:11 PM

## 2020-07-13 NOTE — TOC Transition Note (Signed)
Transition of Care [facility]) - CM/SW Discharge Note   Patient Details  Name: [name] MRN: [id number] Date of Birth: 1940-07-27  Transition [facility]) CM/SW Contact:  Mearl Latin, LCSW Phone Number: 07/13/2020, 2:50 PM   Clinical Narrative:    Patient will DC to: [facility] Anticipated DC date: 07/13/20 Family notified: Willaim Sheng at bedside Transport by: Sharin Mons   Per MD patient ready for DC to [facility]. RN to call report prior to discharge [phone] room 508). RN, patient, patient's family, and facility notified of DC. Discharge Summary and FL2 sent to facility. DC packet on chart. Ambulance transport requested for patient. [name] completing paperwork for patient at 2:30pm. Clinicals sent to insurance to see if she would be approved for any rehab. [facility] will follow up.   CSW will sign off for now as social work intervention is no longer needed. Please consult [place] again if new needs arise.     Final next level of care: Skilled Nursing Facility Barriers to Discharge: No Barriers Identified   Patient Goals and CMS Choice Patient states their goals for this hospitalization and ongoing recovery are:: return to snf CMS Medicare.gov Compare Post Acute Care list provided to:: Patient Choice offered to / list presented to : Patient (friend at bedside)  Discharge Placement   Existing PASRR number confirmed : 07/13/20          Patient chooses bed at: [facility] Patient to be transferred to facility by: [facility] Name of family member notified: Friend at bedside Patient and family notified of of transfer: 07/13/20  Discharge Plan and Services                                     Social Determinants of Health (SDOH) Interventions     Readmission Risk Interventions No flowsheet data found.

## 2020-07-15 LAB — URINE CULTURE: Culture: >=100000 — AB

## 2020-07-16 LAB — CULTURE, BLOOD (ROUTINE X 2)
Culture: NO GROWTH
Special Requests: ADEQUATE

## 2020-07-17 LAB — CULTURE, BLOOD (ROUTINE X 2): Culture: NO GROWTH

## 2020-07-22 ENCOUNTER — Emergency Department (HOSPITAL_COMMUNITY): Payer: Medicare Other

## 2020-07-22 ENCOUNTER — Encounter (HOSPITAL_COMMUNITY): Payer: Self-pay

## 2020-07-22 ENCOUNTER — Other Ambulatory Visit: Payer: Self-pay

## 2020-07-22 ENCOUNTER — Emergency Department (HOSPITAL_COMMUNITY)
Admission: EM | Admit: 2020-07-22 | Discharge: 2020-07-22 | Disposition: A | Discharge status: Home / Self Care | Payer: Medicare Other | Attending: Emergency Medicine | Admitting: Emergency Medicine

## 2020-07-22 DIAGNOSIS — M25561 Pain in right knee: Secondary | ICD-10-CM | POA: Insufficient documentation

## 2020-07-22 DIAGNOSIS — Z7901 Long term (current) use of anticoagulants: Secondary | ICD-10-CM | POA: Insufficient documentation

## 2020-07-22 DIAGNOSIS — W06XXXA Fall from bed, initial encounter: Secondary | ICD-10-CM | POA: Insufficient documentation

## 2020-07-22 DIAGNOSIS — W19XXXA Unspecified fall, initial encounter: Secondary | ICD-10-CM

## 2020-07-22 DIAGNOSIS — S0990XA Unspecified injury of head, initial encounter: Secondary | ICD-10-CM

## 2020-07-22 DIAGNOSIS — S8992XA Unspecified injury of left lower leg, initial encounter: Secondary | ICD-10-CM | POA: Diagnosis present

## 2020-07-22 DIAGNOSIS — M79671 Pain in right foot: Secondary | ICD-10-CM | POA: Insufficient documentation

## 2020-07-22 DIAGNOSIS — S9032XA Contusion of left foot, initial encounter: Secondary | ICD-10-CM | POA: Diagnosis not present

## 2020-07-22 DIAGNOSIS — Y92129 Unspecified place in nursing home as the place of occurrence of the external cause: Secondary | ICD-10-CM | POA: Diagnosis not present

## 2020-07-22 DIAGNOSIS — S8002XA Contusion of left knee, initial encounter: Secondary | ICD-10-CM

## 2020-07-22 LAB — I-STAT CHEM 8, ED
BUN: 8 mg/dL (ref 8–23)
Calcium, Ion: 0.98 mmol/L — ABNORMAL LOW (ref 1.15–1.40)
Chloride: 93 mmol/L — ABNORMAL LOW (ref 98–111)
Creatinine, Ser: 0.7 mg/dL (ref 0.44–1.00)
Glucose, Bld: 117 mg/dL — ABNORMAL HIGH (ref 70–99)
HCT: 27 % — ABNORMAL LOW (ref 36.0–46.0)
Hemoglobin: 9.2 g/dL — ABNORMAL LOW (ref 12.0–15.0)
Potassium: 3.4 mmol/L — ABNORMAL LOW (ref 3.5–5.1)
Sodium: 142 mmol/L (ref 135–145)
TCO2: 39 mmol/L — ABNORMAL HIGH (ref 22–32)

## 2020-07-22 MED ORDER — ACETAMINOPHEN 500 MG PO TABS
1000.0000 mg | ORAL_TABLET | Freq: Once | ORAL | Status: AC
  Administered 2020-07-22: 1000 mg via ORAL
  Filled 2020-07-22: qty 2

## 2020-07-22 NOTE — ED Provider Notes (Signed)
[facility] Provider [facility]  CSN: [id number] Arrival date & time: 07/22/20 0550  Chief Complaint(s) Fall  HPI [name] is a 80 y.o. female who presents to the emergency department as a level 2 trauma after a mechanical fall.  Patient sustained minor head trauma and is on Eliquis.  She reports no loss of consciousness.  Reports that she fell out of bed while trying to rollover and get the remote.  Reports that she hit her feet and landed on both knees.  She is endorsing worst of her pain in the left knee and left foot.  Pain is a aching/throbbing pain worse with palpation and range of motion.  Alleviated by mobility.  She denies any neck pain or back pain.  No chest pain or abdominal pain.  No hip pain.  No other extremity pain.  HPI  Past Medical History History reviewed. No pertinent past medical history. There are no problems to display for this patient.  Home Medication(s) Prior to Admission medications   Not on File                                                                                                                                    Past Surgical History ** The histories are not reviewed yet. Please review them in the "History" navigator section and refresh this SmartLink. Family History No family history on file.  Social History   Allergies Patient has no allergy information on record.  Review of Systems Review of Systems All other systems are reviewed and are negative for acute change except as noted in the HPI  Physical Exam Vital Signs  I have reviewed the triage vital signs BP (!) 123/55   Pulse 69   Temp 98.1 F (36.7 C) (Oral)   Resp 17   Ht 5\' 3"  (1.6 m)   Wt 112 kg   SpO2 99%   BMI 43.75 kg/m   Physical Exam Constitutional:      General: She is not in acute distress.    Appearance: She is well-developed. She is not diaphoretic.  HENT:     Head: Normocephalic and atraumatic.     Right Ear:  External ear normal.     Left Ear: External ear normal.     Nose: Nose normal.  Eyes:     General: No scleral icterus.       Right eye: No discharge.        Left eye: No discharge.     Conjunctiva/sclera: Conjunctivae normal.     Pupils: Pupils are equal, round, and reactive to light.  Cardiovascular:     Rate and Rhythm: Normal rate and regular rhythm.     Pulses:          Radial pulses are 2+ on the right side and 2+ on the left side.       Dorsalis pedis pulses are 2+  on the right side and 2+ on the left side.     Heart sounds: Normal heart sounds. No murmur heard.   No friction rub. No gallop.  Pulmonary:     Effort: Pulmonary effort is normal. No respiratory distress.     Breath sounds: Normal breath sounds. No stridor. No wheezing.  Abdominal:     General: There is no distension.     Palpations: Abdomen is soft.     Tenderness: There is no abdominal tenderness.  Musculoskeletal:     Cervical back: Normal range of motion and neck supple. No bony tenderness.     Thoracic back: No bony tenderness.     Lumbar back: No bony tenderness.     Right knee: No deformity. Tenderness present.     Left knee: No deformity. Tenderness present.     Right foot: Normal capillary refill. Tenderness present. No deformity. Normal pulse.     Left foot: Normal capillary refill. Swelling and tenderness present. No deformity. Normal pulse.       Legs:     Comments: Clavicles stable. Chest stable to AP/Lat compression. Pelvis stable to Lat compression. No obvious extremity deformity. No chest or abdominal wall contusion.  Skin:    General: Skin is warm and dry.     Findings: No erythema or rash.  Neurological:     Mental Status: She is alert and oriented to person, place, and time.     Comments: Moving all extremities    ED Results and Treatments Labs (all labs ordered are listed, but only abnormal results are displayed) Labs Reviewed  I-STAT CHEM 8, ED - Abnormal; Notable for the  following components:      Result Value   Potassium 3.4 (*)    Chloride 93 (*)    Glucose, Bld 117 (*)    Calcium, Ion 0.98 (*)    TCO2 39 (*)    Hemoglobin 9.2 (*)    HCT 27.0 (*)    All other components within normal limits                                                                                                                         EKG  EKG Interpretation  Date/Time:    Ventricular Rate:    PR Interval:    QRS Duration:   QT Interval:    QTC Calculation:   R Axis:     Text Interpretation:          Radiology CT Head Wo Contrast  Result Date: 07/22/2020 CLINICAL DATA:  80 year old female status post fall out of bed. Left side head swelling. EXAM: CT HEAD WITHOUT CONTRAST TECHNIQUE: Contiguous axial images were obtained from the base of the skull through the vertex without intravenous contrast. COMPARISON:  Head CT 07/11/2020 and earlier. FINDINGS: Brain: No midline shift, ventriculomegaly, mass effect, evidence of mass lesion, intracranial hemorrhage or evidence of cortically based acute infarction. Patchy bilateral white matter hypodensity is stable, mild to moderate  for age. Vascular: Calcified atherosclerosis at the skull base. No suspicious intracranial vascular hyperdensity. Skull: Stable hyperostosis frontalis, normal variant. No acute osseous abnormality identified. Sinuses/Orbits: Visualized paranasal sinuses and mastoids are stable and well aerated, there is chronic left sphenoid mucosal thickening. Other: No acute orbit or scalp soft tissue injury identified. IMPRESSION: 1. No acute intracranial abnormality or acute traumatic injury identified. 2. Stable mild to moderate chronic white matter disease. Electronically Signed   By: Odessa Fleming M.D.   On: 07/22/2020 07:05   DG Knee Complete 4 Views Left  Result Date: 07/22/2020 CLINICAL DATA:  Fall.  Left knee pain.  Initial encounter. EXAM: LEFT KNEE - COMPLETE 4+ VIEW COMPARISON:  None. FINDINGS: No evidence of  fracture, dislocation, or joint effusion. Total knee arthroplasty is seen with all 3 components in expected position. No other focal bone abnormality. Soft tissues are unremarkable. IMPRESSION: No acute findings.  Previous total knee arthroplasty. Electronically Signed   By: Danae Orleans M.D.   On: 07/22/2020 07:06   DG Knee Complete 4 Views Right  Result Date: 07/22/2020 CLINICAL DATA:  Fall.  Right knee pain.  Initial encounter. EXAM: RIGHT KNEE - COMPLETE 4+ VIEW COMPARISON:  None. FINDINGS: No evidence of fracture, dislocation, or joint effusion. Total knee arthroplasty is seen with all 3 components in expected position. No other focal bone abnormality. Generalized osteopenia noted. Soft tissues are unremarkable. IMPRESSION: Previous total knee arthroplasty.  No acute findings. Electronically Signed   By: Danae Orleans M.D.   On: 07/22/2020 07:07   DG Foot Complete Left  Result Date: 07/22/2020 CLINICAL DATA:  Fall.  Left hip pain.  Initial encounter. EXAM: LEFT FOOT - COMPLETE 3+ VIEW COMPARISON:  None. FINDINGS: There is no evidence of fracture or dislocation. There is no evidence of arthropathy. Generalized osteopenia noted. Small plantar calcaneal bone spur also seen. IMPRESSION: No acute findings. Electronically Signed   By: Danae Orleans M.D.   On: 07/22/2020 07:03   DG Foot Complete Right  Result Date: 07/22/2020 CLINICAL DATA:  Fall.  Right foot pain.  Initial encounter. EXAM: RIGHT FOOT COMPLETE - 3+ VIEW COMPARISON:  None. FINDINGS: There is no evidence of acute fracture or dislocation. Old fractures with chronic periosteal thickening are seen involving the 2nd and 3rd metatarsal shafts. Moderate hallux valgus noted with bony bunion formation. Generalized osteopenia also seen. Small plantar and dorsal calcaneal bone spurs are noted. IMPRESSION: No acute findings. Moderate hallux valgus with bony bunion. Electronically Signed   By: Danae Orleans M.D.   On: 07/22/2020 07:05    Pertinent labs &  imaging results that were available during my care of the patient were reviewed by me and considered in my medical decision making (see chart for details).  Medications Ordered in ED Medications  acetaminophen (TYLENOL) tablet 1,000 mg (has no administration in time range)  Procedures Procedures  (including critical care time)  Medical Decision Making / ED Course I have reviewed the nursing notes for this encounter and the patient's prior records (if available in EHR or on provided paperwork).   [name] was evaluated in Emergency Department on 07/22/2020 for the symptoms described in the history of present illness. She was evaluated in the context of the global COVID-19 pandemic, which necessitated consideration that the patient might be at risk for infection with the SARS-CoV-2 virus that causes COVID-19. Institutional protocols and algorithms that pertain to the evaluation of patients at risk for COVID-19 are in a state of rapid change based on information released by regulatory bodies including the CDC and federal and state organizations. These policies and algorithms were followed during the patient's care in the ED.    Clinical Course as of 07/22/20 0752  Sun Jul 22, 2020  0555 Mechanical fall at home resulting in minor head trauma.  Most of the pain is in the lower extremities. ABCs intact. Secondary as above. The patient is alert and oriented, without evidence of intoxication. They have no midline cervical tenderness, distracting injuries, or neuro deficits. Cervical spine cleared by Nexus criteria. Collar removed.  Will obtain plain films of the bilateral knees and feet.  CT head to rule out ICH due to anticoagulation and minor head trauma. [PC]    Clinical Course User Index [PC] Tomeka Kantner, Amadeo Garnet, MD    CT head negative for ICH. Plain  films of bilateral knees and feet without evidence of acute fracture or dislocation.. Suspect soft tissue injury including hematoma/contusion from her fall.  Exacerbated by her Eliquis.  No other injuries noted on exam requiring further imaging or work-up at this time. Final Clinical Impression(s) / ED Diagnoses Final diagnoses:  Fall  Fall at nursing home, initial encounter  Minor head injury, initial encounter  Contusion of left knee, initial encounter  Contusion of left foot, initial encounter    The patient appears reasonably screened and/or stabilized for discharge and I doubt any other medical condition or other [facility] requiring further screening, evaluation, or treatment in the ED at this time prior to discharge. Safe for discharge with strict return precautions.  Disposition: Discharge  Condition: Good  I have discussed the results, Dx and Tx plan with the patient/family who expressed understanding and agree(s) with the plan. Discharge instructions discussed at length. The patient/family was given strict return precautions who verbalized understanding of the instructions. No further questions at time of discharge.    ED Discharge Orders     None         Follow Up: Gracelyn Nurse, MD [address] [place] [address] [phone]  Call  as needed    This chart was dictated using voice recognition software.  Despite best efforts to proofread,  errors can occur which can change the documentation meaning.    Nira Conn, MD 07/22/20 [phone]

## 2020-07-22 NOTE — ED Notes (Signed)
Per Dr. Eudelia Bunch, no need for trauma labs at this time. Full trauma panel drawn from right [facility] prior, sent to lab to spin.

## 2020-07-22 NOTE — ED Notes (Signed)
Patient transported to CT 

## 2020-07-22 NOTE — ED Notes (Signed)
Labs drawn at this time. 

## 2020-07-22 NOTE — ED Notes (Signed)
RN attempted to call report to [facility]. Stated their phones are down and they would have nurse call me when they can.

## 2020-07-22 NOTE — ED Notes (Signed)
Trauma Response Nurse Note-  Reason for Call / Reason for Trauma activation:   - level two fall on anticoags  Initial Focused Assessment (If applicable, or please see trauma documentation):  - No obvious deformities noted, bruising to bilateral knees, abrasion to left knee. Bruising to bilateral feet. Hematoma to head.  Interventions:  - XRAYS, CT head, blood draw  Plan of Care as of this note:  - Pending results of imaging, anticipate discharge to facility if imaging negative  Event Summary:   - Patient arrives via EMS from SNF after a fall, reports hitting her head on oxygen tank and landing on her knees. C/o bilateral knee and toe pain. On eliquis. Reports no LOC. Ccollar removed upon initial trauma assessment by EDP Cardama. Pending imaging results, no consults at this time.    The Following (if applicable):    -MD notified: Cardama    -Time of Page/Time of notification: 0534    -TRN arrival Time: 0540    -End time: 0630

## 2020-07-22 NOTE — ED Notes (Signed)
c-collar removed

## 2020-07-22 NOTE — ED Notes (Signed)
Radiology arrival

## 2020-07-22 NOTE — ED Triage Notes (Signed)
Brought by [facility] from [facility]. Pt was laying in bed trying to reach for her remote and fell out of bed. Pt denies any LOC, has swelling noted to lt side of head, c/o pain to bilat lower knees and feet pain, pain when sat up and moved. Pt is on eliquis.

## 2020-07-23 ENCOUNTER — Encounter (HOSPITAL_COMMUNITY): Payer: Self-pay | Admitting: *Deleted

## 2020-08-02 ENCOUNTER — Other Ambulatory Visit: Payer: Self-pay | Admitting: Internal Medicine

## 2020-08-02 DIAGNOSIS — I350 Nonrheumatic aortic (valve) stenosis: Secondary | ICD-10-CM

## 2020-08-02 DIAGNOSIS — I2510 Atherosclerotic heart disease of native coronary artery without angina pectoris: Secondary | ICD-10-CM

## 2020-08-02 DIAGNOSIS — I25119 Atherosclerotic heart disease of native coronary artery with unspecified angina pectoris: Secondary | ICD-10-CM

## 2020-08-17 ENCOUNTER — Ambulatory Visit: Payer: Medicare Other

## 2020-11-02 ENCOUNTER — Other Ambulatory Visit: Payer: Self-pay

## 2020-11-02 ENCOUNTER — Emergency Department: Payer: Medicare Other

## 2020-11-02 ENCOUNTER — Emergency Department
Admission: EM | Admit: 2020-11-02 | Discharge: 2020-11-02 | Disposition: A | Discharge status: Home / Self Care | Payer: Medicare Other | Attending: Emergency Medicine | Admitting: Emergency Medicine

## 2020-11-02 DIAGNOSIS — R059 Cough, unspecified: Secondary | ICD-10-CM | POA: Insufficient documentation

## 2020-11-02 DIAGNOSIS — Z96653 Presence of artificial knee joint, bilateral: Secondary | ICD-10-CM | POA: Diagnosis not present

## 2020-11-02 DIAGNOSIS — I509 Heart failure, unspecified: Secondary | ICD-10-CM | POA: Diagnosis not present

## 2020-11-02 DIAGNOSIS — Z79899 Other long term (current) drug therapy: Secondary | ICD-10-CM | POA: Diagnosis not present

## 2020-11-02 DIAGNOSIS — I4891 Unspecified atrial fibrillation: Secondary | ICD-10-CM | POA: Insufficient documentation

## 2020-11-02 DIAGNOSIS — R0602 Shortness of breath: Secondary | ICD-10-CM | POA: Insufficient documentation

## 2020-11-02 DIAGNOSIS — I110 Hypertensive heart disease with heart failure: Secondary | ICD-10-CM | POA: Insufficient documentation

## 2020-11-02 DIAGNOSIS — Z87891 Personal history of nicotine dependence: Secondary | ICD-10-CM | POA: Diagnosis not present

## 2020-11-02 DIAGNOSIS — R110 Nausea: Secondary | ICD-10-CM | POA: Insufficient documentation

## 2020-11-02 DIAGNOSIS — Z7901 Long term (current) use of anticoagulants: Secondary | ICD-10-CM | POA: Insufficient documentation

## 2020-11-02 DIAGNOSIS — E119 Type 2 diabetes mellitus without complications: Secondary | ICD-10-CM | POA: Diagnosis not present

## 2020-11-02 DIAGNOSIS — R079 Chest pain, unspecified: Secondary | ICD-10-CM | POA: Diagnosis not present

## 2020-11-02 DIAGNOSIS — Z20822 Contact with and (suspected) exposure to covid-19: Secondary | ICD-10-CM | POA: Insufficient documentation

## 2020-11-02 DIAGNOSIS — Z7984 Long term (current) use of oral hypoglycemic drugs: Secondary | ICD-10-CM | POA: Diagnosis not present

## 2020-11-02 LAB — BASIC METABOLIC PANEL
Anion gap: 9 (ref 5–15)
BUN: 8 mg/dL (ref 8–23)
CO2: 36 mmol/L — ABNORMAL HIGH (ref 22–32)
Calcium: 9 mg/dL (ref 8.9–10.3)
Chloride: 97 mmol/L — ABNORMAL LOW (ref 98–111)
Creatinine, Ser: 0.63 mg/dL (ref 0.44–1.00)
GFR, Estimated: >60 mL/min (ref >60)
Glucose, Bld: 107 mg/dL — ABNORMAL HIGH (ref 70–99)
Potassium: 3.3 mmol/L — ABNORMAL LOW (ref 3.5–5.1)
Sodium: 142 mmol/L (ref 135–145)

## 2020-11-02 LAB — CBC
HCT: 30.9 % — ABNORMAL LOW (ref 36.0–46.0)
Hemoglobin: 9.3 g/dL — ABNORMAL LOW (ref 12.0–15.0)
MCH: 28.3 pg (ref 26.0–34.0)
MCHC: 30.1 g/dL (ref 30.0–36.0)
MCV: 93.9 fL (ref 80.0–100.0)
Platelets: 233 10*3/uL (ref 150–400)
RBC: 3.29 MIL/uL — ABNORMAL LOW (ref 3.87–5.11)
RDW: 15.5 % (ref 11.5–15.5)
WBC: 9.8 10*3/uL (ref 4.0–10.5)
nRBC: 0 % (ref 0.0–0.2)

## 2020-11-02 LAB — HEPATIC FUNCTION PANEL
ALT: 8 U/L (ref 0–44)
AST: 18 U/L (ref 15–41)
Albumin: 3.5 g/dL (ref 3.5–5.0)
Alkaline Phosphatase: 42 U/L (ref 38–126)
Bilirubin, Direct: 0.1 mg/dL (ref 0.0–0.2)
Indirect Bilirubin: 0.8 mg/dL (ref 0.3–0.9)
Total Bilirubin: 0.9 mg/dL (ref 0.3–1.2)
Total Protein: 6.9 g/dL (ref 6.5–8.1)

## 2020-11-02 LAB — RESP PANEL BY RT-PCR (FLU A&B, COVID) ARPGX2
Influenza A by PCR: NEGATIVE
Influenza B by PCR: NEGATIVE
SARS Coronavirus 2 by RT PCR: NEGATIVE

## 2020-11-02 LAB — TROPONIN I (HIGH SENSITIVITY)
Troponin I (High Sensitivity): 13 ng/L (ref <18)
Troponin I (High Sensitivity): 14 ng/L (ref <18)

## 2020-11-02 LAB — BRAIN NATRIURETIC PEPTIDE: B Natriuretic Peptide: 184.7 pg/mL — ABNORMAL HIGH (ref 0.0–100.0)

## 2020-11-02 MED ORDER — ALUM & MAG HYDROXIDE-SIMETH 200-200-20 MG/5ML PO SUSP
30.0000 mL | Freq: Once | ORAL | Status: AC
  Administered 2020-11-02: 30 mL via ORAL
  Filled 2020-11-02: qty 30

## 2020-11-02 MED ORDER — POTASSIUM CHLORIDE CRYS ER 20 MEQ PO TBCR: 20.0000 meq | EXTENDED_RELEASE_TABLET | Freq: Every day | ORAL | 0 refills | Status: DC

## 2020-11-02 MED ORDER — LIDOCAINE VISCOUS HCL 2 % MT SOLN
15.0000 mL | Freq: Once | OROMUCOSAL | Status: AC
  Administered 2020-11-02: 15 mL via ORAL
  Filled 2020-11-02: qty 15

## 2020-11-02 MED ORDER — FUROSEMIDE 20 MG PO TABS
ORAL_TABLET | ORAL | 0 refills | Status: DC
Start: 2020-11-02 — End: 2023-04-08

## 2020-11-02 MED ORDER — FUROSEMIDE 20 MG PO TABS: ORAL_TABLET | ORAL | 0 refills | Status: DC

## 2020-11-02 MED ORDER — FUROSEMIDE 10 MG/ML IJ SOLN
40.0000 mg | Freq: Once | INTRAMUSCULAR | Status: AC
  Administered 2020-11-02: 40 mg via INTRAVENOUS
  Filled 2020-11-02: qty 4

## 2020-11-02 MED ORDER — POTASSIUM CHLORIDE CRYS ER 20 MEQ PO TBCR
40.0000 meq | EXTENDED_RELEASE_TABLET | Freq: Once | ORAL | Status: AC
  Administered 2020-11-02: 40 meq via ORAL
  Filled 2020-11-02: qty 2

## 2020-11-02 NOTE — ED Notes (Signed)
Emptied 1300 ml of urine  states she feels better

## 2020-11-02 NOTE — ED Provider Notes (Addendum)
[facility] Emergency Department Provider Note  ____________________________________________   Event Date/Time   First MD Initiated Contact with Patient 11/02/20 [phone]     (approximate)  I have reviewed the triage vital signs and the nursing notes.   HISTORY  Chief Complaint No chief complaint on file.    HPI [name] is a 80 y.o. female with past medical history as below including CHF here with shortness of breath and mild chest pressure.  Patient states that for the last 24 hours or so, she has had shortness of breath, cough overnight, and a mild, dull, chest pressure.  She feels like she has a fullness sensation in her chest.  She states her symptoms started yesterday as a cough that kept her up throughout the night.  She got up and her cough actually improved, but she has since had some moderate, persistent, chest pressure.  She had some mild nausea with it.  No vomiting.  No diaphoresis.  She does feel like she has increased leg swelling over the last several days.  She been taking her meds as prescribed.  Denies any palpitations.  No other acute complaints.  Denies history of coronary disease or stenting.  No recent medication changes.    Past Medical History:  Diagnosis Date   AICD (automatic cardioverter/defibrillator) present    PLACED BY  DR  Graciela Husbands 2008   Angina    5-6 YRS AGO   Anxiety    Arthritis    Atrial fibrillation (HCC)    CHF (congestive heart failure) (HCC)    Complication of anesthesia    states at times had a tough time waking up   Diabetes mellitus    Diverticulum of esophagus    large diverticulum in the middle third of the esophagus [facility] EGD '09)   Dysrhythmia    A-FIB   Fibromyalgia    Headache(784.0)    Heartburn    History of stomach ulcers    History of transient ischemic attack (TIA)    LAST ONE IN  2005  IN  [place], [place]   HLD (hyperlipidemia)    Hypertension    Shortness of breath    EASING OFF NOW    Sleep apnea    DOESN'T KNOW SETTINGS   Stroke (cerebrum) (HCC)    TIA (transient ischemic attack)     Patient Active Problem List   Diagnosis Date Noted   AKI (acute kidney injury) (HCC) 07/12/2020   Acute kidney injury (HCC) 07/11/2020   Sleep apnea    Chronic atrial fibrillation (HCC)    Elevated troponin    Elevated LFTs    Chronic respiratory failure with hypoxia (HCC)    Type 2 diabetes mellitus (HCC)    Hypertension associated with diabetes (HCC)    Hyperlipidemia associated with type 2 diabetes mellitus (HCC)    Generalized weakness 04/12/2020   Obesity, Class III, BMI 40-49.9 (morbid obesity) (HCC) 04/12/2020   Obesity, diabetes, and hypertension syndrome (HCC) 04/12/2020   Metabolic syndrome 04/12/2020   Shortness of breath 04/12/2020   Overdose of coumadin, accidental or unintentional, initial encounter 07/29/2019   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   Piriformis syndrome 06/18/2014   Facet syndrome, lumbar 06/18/2014   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/2014   DDD (degenerative disc disease), lumbar 06/18/2014   DDD (degenerative disc disease), cervical 06/08/2014   DDD (degenerative disc disease), 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   Cervical spinal stenosis 04/09/2011    Past Surgical History:  Procedure Laterality Date   ANTERIOR CERVICAL DECOMP/DISCECTOMY FUSION  04/09/2011   Procedure: ANTERIOR CERVICAL DECOMPRESSION/DISCECTOMY FUSION 1 LEVEL;  Surgeon: Temple Pacini, MD;  Location: [facility];  Service: Neurosurgery;  Laterality: N/A;  Cervical five-six Anterior Cervical Decompression and Fusion   APPENDECTOMY     BILATERAL OOPHORECTOMY     BENIGN   CARDIAC CATHETERIZATION     X 2    LAST ONE  2009   CESAREAN SECTION     X  4   CHOLECYSTECTOMY     IMPLANTABLE CARDIOVERTER DEFIBRILLATOR (ICD) GENERATOR CHANGE Left 01/18/2019   Procedure: ICD GENERATOR CHANGE;  Surgeon: Marcina Millard, MD;  Location: [facility] ORS;  Service: Cardiovascular;   Laterality: Left;   INSERT / REPLACE / REMOVE PACEMAKER     ST  JUDE  DEVICE   JOINT REPLACEMENT     BIL  KNEES   TONSILLECTOMY     TUBAL LIGATION      Prior to Admission medications   Medication Sig Start Date End Date Taking? Authorizing Provider  albuterol (VENTOLIN HFA) 108 (90 Base) MCG/ACT inhaler Inhale 2 puffs into the lungs every 4 (four) hours as needed for wheezing or shortness of breath.    [provider]  alendronate (FOSAMAX) 70 MG tablet Take 70 mg by mouth every Monday.     [provider]  benzonatate (TESSALON) 200 MG capsule Take 200 mg by mouth 3 (three) times daily as needed for cough.    [provider]  carvedilol (COREG) 12.5 MG tablet Take 12.5 mg by mouth 2 (two) times daily with a meal.    [provider]  Cholecalciferol 25 MCG (1000 UT) tablet Take 1,000 Units by mouth daily.     [provider]  Cyanocobalamin 1000 MCG TBCR Take 1,000 mcg by mouth daily.     [provider]  diazepam (VALIUM) 2 MG tablet Take 1 tablet (2 mg total) by mouth 2 (two) times daily. 07/13/20   Meredeth Ide, MD  diclofenac Sodium (VOLTAREN) 1 % GEL Apply 2 g topically daily as needed (for pain).    [provider]  ELIQUIS 5 MG TABS tablet Take 5 mg by mouth 2 (two) times daily. 09/21/19   [provider]  furosemide (LASIX) 20 MG tablet Take 20 mg (1 tablet) daily for 3 days, then go back to your previous 10 mg daily. 11/02/20   Shaune Pollack, MD  gabapentin (NEURONTIN) 100 MG capsule Take 200 mg by mouth 3 (three) times daily.  11/30/13   [provider]  meclizine (ANTIVERT) 25 MG tablet Take 25 mg by mouth 3 (three) times daily as needed for dizziness.    [provider]  metFORMIN (GLUCOPHAGE) 500 MG tablet Take 500 mg by mouth 2 (two) times daily with a meal.    [provider]  Multiple Vitamins-Minerals (MULTIVITAMIN WITH MINERALS) tablet Take 1 tablet by mouth daily.    [provider]  omeprazole (PRILOSEC) 20 MG capsule Take 20 mg by mouth daily.    [provider]  pioglitazone (ACTOS) 15 MG tablet Take 15 mg by mouth daily.    [provider]  polyethylene glycol (MIRALAX / GLYCOLAX) 17 g packet Take 17 g by mouth daily.    [provider]  potassium chloride SA (KLOR-CON) 20 MEQ tablet Take 1 tablet (20 mEq total) by mouth daily for 3 days. 11/02/20 11/05/20  Shaune Pollack, MD  rosuvastatin (CRESTOR) 10 MG tablet Take  10 mg by mouth daily.    [provider]  sertraline (ZOLOFT) 100 MG tablet Take 100 mg by mouth daily.    [provider]    Allergies Ciprofloxacin hcl, Dopamine, Sulfa antibiotics, Sulfacetamide sodium, and Tegaderm ag mesh [silver]  Family History  Problem Relation Age of Onset   Kidney disease Neg Hx    Bladder Cancer Neg Hx     Social History Social History   Tobacco Use   Smoking status: Former    Packs/day: 1.00    Years: 20.00    Pack years: 20.00    Types: Cigarettes    Quit date: 03/19/1991    Years since quitting: 29.6   Smokeless tobacco: Never  Vaping Use   Vaping Use: Never used  Substance Use Topics   Alcohol use: No   Drug use: No    Review of Systems  Review of Systems  Constitutional:  Positive for fatigue. Negative for fever.  HENT:  Negative for congestion and sore throat.   Eyes:  Negative for visual disturbance.  Respiratory:  Positive for cough and shortness of breath.   Cardiovascular:  Positive for chest pain.  Gastrointestinal:  Negative for abdominal pain, diarrhea, nausea and vomiting.  Genitourinary:  Negative for flank pain.  Musculoskeletal:  Negative for back pain and neck pain.  Skin:  Negative for rash and wound.  Neurological:  Negative for weakness.  All other systems reviewed and are negative.   ____________________________________________  PHYSICAL EXAM:      VITAL SIGNS: ED Triage Vitals  Enc Vitals Group     BP 11/02/20  0709 (!) 150/59     Pulse Rate 11/02/20 0709 78     Resp 11/02/20 0709 20     Temp 11/02/20 0709 99.4 F (37.4 C)     Temp Source 11/02/20 0709 Oral     SpO2 11/02/20 0709 93 %     Weight 11/02/20 0708 243 lb (110.2 kg)     Height 11/02/20 0708 5\' 3"  (1.6 m)     Head Circumference --      Peak Flow --      Pain Score 11/02/20 0708 4     Pain Loc --      Pain Edu? --      Excl. in GC? --      Physical Exam Vitals and nursing note reviewed.  Constitutional:      General: She is not in acute distress.    Appearance: She is well-developed.  HENT:     Head: Normocephalic and atraumatic.  Eyes:     Conjunctiva/sclera: Conjunctivae normal.  Cardiovascular:     Rate and Rhythm: Normal rate and regular rhythm.     Heart sounds: Normal heart sounds. No murmur heard.   No friction rub.  Pulmonary:     Effort: Pulmonary effort is normal. No respiratory distress.     Breath sounds: Rales (Bibasilar) present. No wheezing.  Abdominal:     General: There is no distension.     Palpations: Abdomen is soft.     Tenderness: There is no abdominal tenderness.  Musculoskeletal:     Cervical back: Neck supple.     Right lower leg: Edema (1+ pitting) present.     Left lower leg: Edema (1+ pitting) present.  Skin:    General: Skin is warm.     Capillary Refill: Capillary refill takes less than 2 seconds.  Neurological:     Mental Status: She is alert and  oriented to person, place, and time.     Motor: No abnormal muscle tone.      ____________________________________________   LABS (all labs ordered are listed, but only abnormal results are displayed)  Labs Reviewed  BASIC METABOLIC PANEL - Abnormal; Notable for the following components:      Result Value   Potassium 3.3 (*)    Chloride 97 (*)    CO2 36 (*)    Glucose, Bld 107 (*)    All other components within normal limits  CBC - Abnormal; Notable for the following components:   RBC 3.29 (*)    Hemoglobin 9.3 (*)    HCT  30.9 (*)    All other components within normal limits  BRAIN NATRIURETIC PEPTIDE - Abnormal; Notable for the following components:   B Natriuretic Peptide 184.7 (*)    All other components within normal limits  RESP PANEL BY RT-PCR (FLU A&B, COVID) ARPGX2  HEPATIC FUNCTION PANEL  TROPONIN I (HIGH SENSITIVITY)  TROPONIN I (HIGH SENSITIVITY)    ____________________________________________  EKG: Ventricular paced rhythm, ventricular rate 70.  QRS 142, QTc 492.  No acute ST elevations or depressions.  No EKG evidence of acute ischemia or infarct. ________________________________________  RADIOLOGY All imaging, including plain films, CT scans, and ultrasounds, independently reviewed by me, and interpretations confirmed via formal radiology reads.  ED MD interpretation:   CXR: Vascular congestion  Official radiology report(s): DG Chest 2 View  Result Date: 11/02/2020 CLINICAL DATA:  Chest pain for 1 day EXAM: CHEST - 2 VIEW COMPARISON:  07/11/2020 FINDINGS: Biventricular pacer/ICD in stable position. Increased, generalized interstitial coarsening. No effusion or pneumothorax. Calcified granuloma at the right base. Chronic cardiomegaly. IMPRESSION: Vascular congestion. Electronically Signed   By: Tiburcio Pea M.D.   On: 11/02/2020 07:46    ____________________________________________  PROCEDURES   Procedure(s) performed (including Critical Care):  Procedures  ____________________________________________  INITIAL IMPRESSION / MDM / ASSESSMENT AND PLAN / ED COURSE  As part of my medical decision making, I reviewed the following data within the electronic MEDICAL RECORD NUMBER Nursing notes reviewed and incorporated, Old chart reviewed, Notes from prior ED visits, and [place] Controlled Substance Database       *[name] was evaluated in Emergency Department on 11/02/2020 for the symptoms described in the history of present illness. She was evaluated in the context of the global  COVID-19 pandemic, which necessitated consideration that the patient might be at risk for infection with the SARS-CoV-2 virus that causes COVID-19. Institutional protocols and algorithms that pertain to the evaluation of patients at risk for COVID-19 are in a state of rapid change based on information released by regulatory bodies including the CDC and federal and state organizations. These policies and algorithms were followed during the patient's care in the ED.  Some ED evaluations and interventions may be delayed as a result of limited staffing during the pandemic.*     Medical Decision Making: 80 year old female here with mild chest pressure and shortness of breath.  Clinically, the patient appears moderately hypervolemic and chest x-ray shows vascular congestion.  BNP is slightly elevated.  EKG is nonischemic and troponins are negative x2, do not suspect ACS.  Lab work shows mild hypokalemia which has been replaced.  COVID-negative.  Patient feels markedly improved after diuresis in the ED.  Suspect mild CHF exacerbation.  She is on her baseline oxygen.  There may also be a component of GI discomfort as she does have history of gastritis.  Will  continue her antacids, DC with increase Lasix for 3 days and I have prescribed a potassium replacement.  Return precautions given.  ____________________________________________  FINAL CLINICAL IMPRESSION(S) / ED DIAGNOSES  Final diagnoses:  Acute on chronic congestive heart failure, unspecified heart failure type (HCC)     MEDICATIONS GIVEN DURING THIS VISIT:  Medications  potassium chloride SA (KLOR-CON) CR tablet 40 mEq (40 mEq Oral Given 11/02/20 0953)  furosemide (LASIX) injection 40 mg (40 mg Intravenous Given 11/02/20 0954)  alum & mag hydroxide-simeth (MAALOX/MYLANTA) 200-200-20 MG/5ML suspension 30 mL (30 mLs Oral Given 11/02/20 1026)    And  lidocaine (XYLOCAINE) 2 % viscous mouth solution 15 mL (15 mLs Oral Given 11/02/20 1026)     ED  Discharge Orders          Ordered    furosemide (LASIX) 20 MG tablet  Status:  Discontinued        11/02/20 1123    potassium chloride SA (KLOR-CON) 20 MEQ tablet  Daily,   Status:  Discontinued        11/02/20 1123    furosemide (LASIX) 20 MG tablet        11/02/20 1132    potassium chloride SA (KLOR-CON) 20 MEQ tablet  Daily        11/02/20 1132             Note:  This document was prepared using Dragon voice recognition software and may include unintentional dictation errors.   Shaune Pollack, MD 11/02/20 Myles Gip    Shaune Pollack, MD 11/02/20 1213

## 2020-11-02 NOTE — ED Notes (Signed)
See triage note  presents via EMS from [facility]  states she had a cough last pm  then had gotten up to use the bathroom  coughing stopped then she developed some tightness in chest  states it is in mid chest   non radiating  denies any n/v or increased SOB

## 2020-11-02 NOTE — Discharge Instructions (Addendum)
INCREASE your lasix from 10 mg to 20 mg daily for the next 3 days, then go back to your usual dose (if taking 20 mg, increase to 40 - essentially double your dose for 3 days)  Take the supplemental potassium with this  Watch your ins and outs, weigh yourself daily

## 2020-11-02 NOTE — ED Triage Notes (Signed)
Pt comes into the ED via EMS from [facility] with c/o chest pain. Pt is in NAD on arrival

## 2020-11-02 NOTE — ED Notes (Signed)
Pt placed on Purewick 

## 2022-02-11 IMAGING — CT CT CERVICAL SPINE W/O CM
3 of 4 series · 11 of 33 positions shown, 13 images · non-contrast
Comparison: None.

CLINICAL DATA: Fall, neck injury

EXAM:
CT CERVICAL SPINE WITHOUT CONTRAST
TECHNIQUE: Multidetector CT imaging of the cervical spine was performed without
intravenous contrast. Multiplanar CT image reconstructions were also
generated.

[Series 4: sagittal bone · sagittal · 0.25mm/px · 5 of 249 slices shown, 6 images]
[im 95/249  bone]
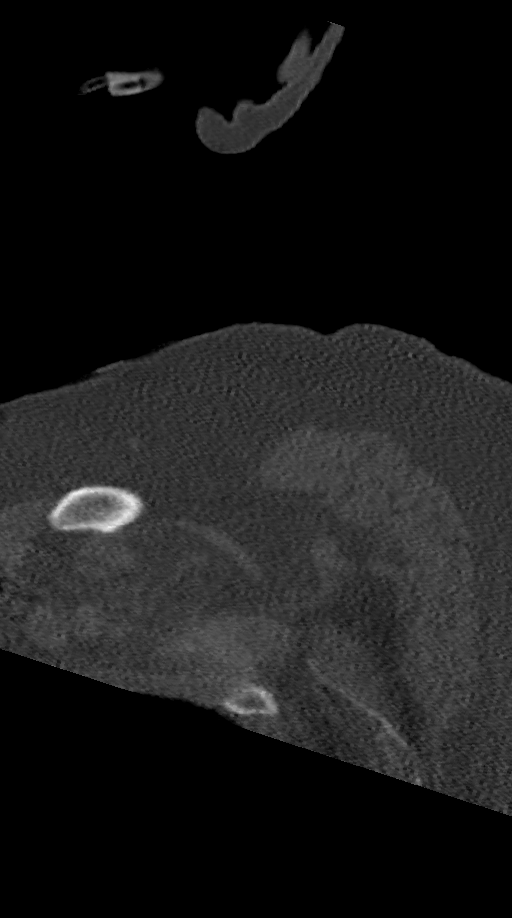
[im 110/249  bone]
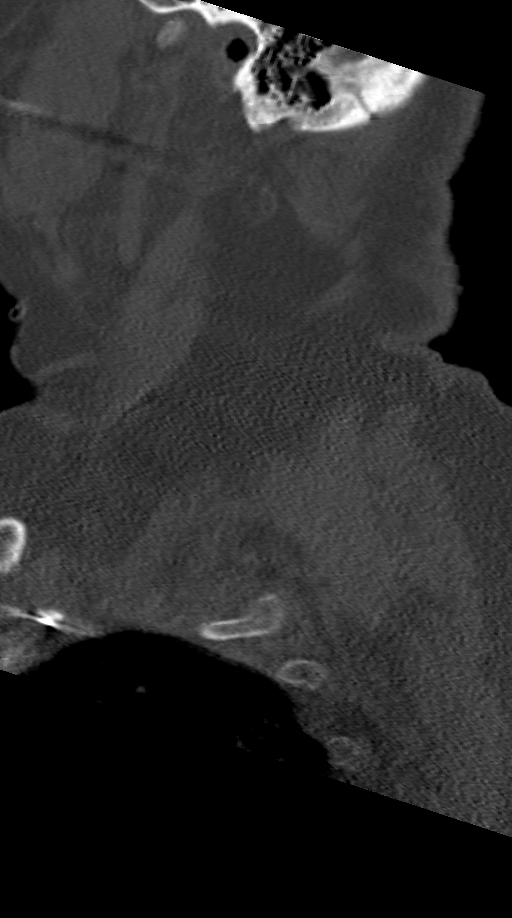
[im 125/249  soft-tissue]
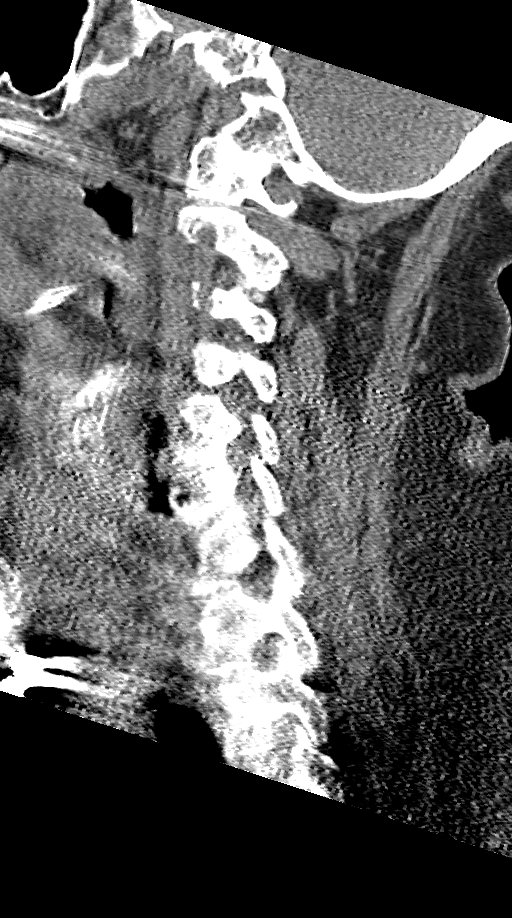
[im 125/249  bone]
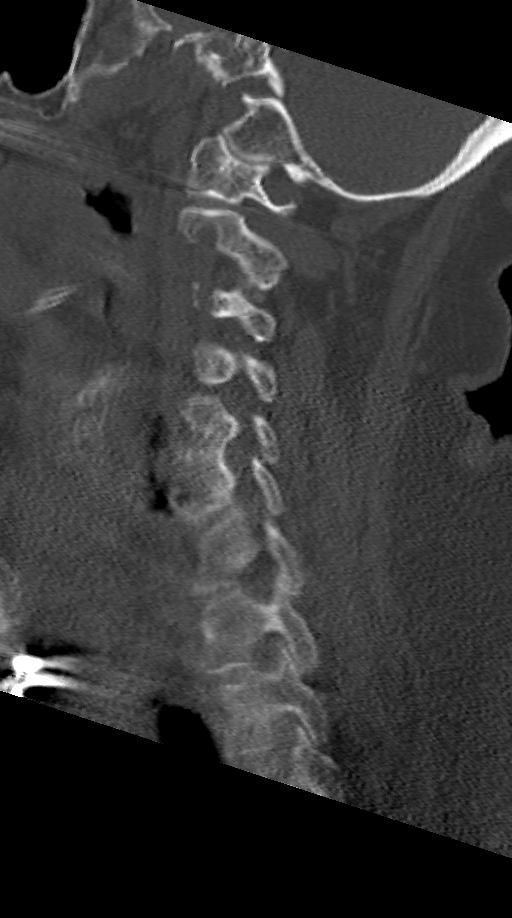
[im 140/249  bone]
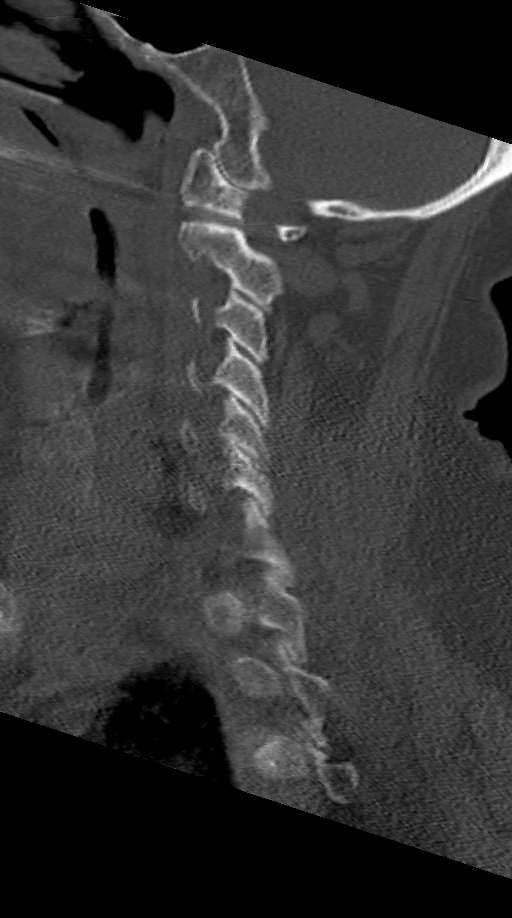
[im 155/249  bone]
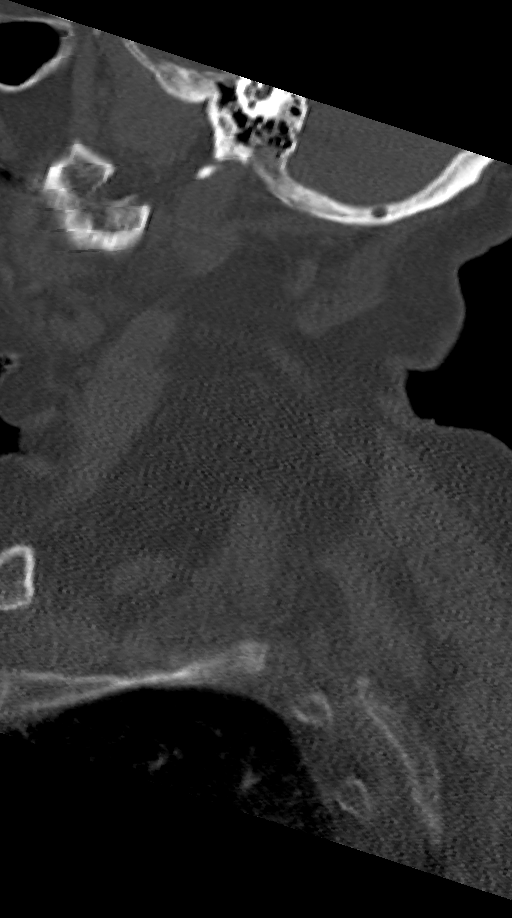

[Series 5: coronal bone · coronal · 0.37mm/px · 3 of 148 slices shown]
[im 55/148  bone]
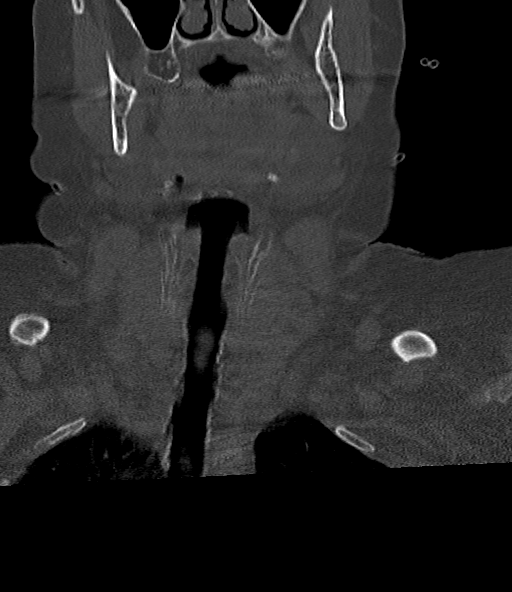
[im 68/148  bone]
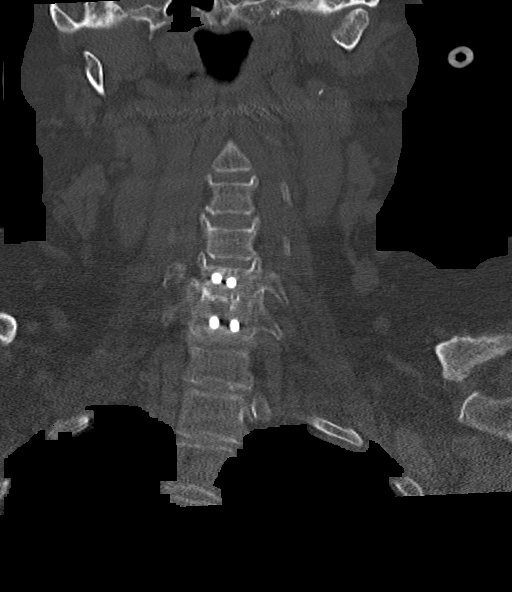
[im 80/148  bone]
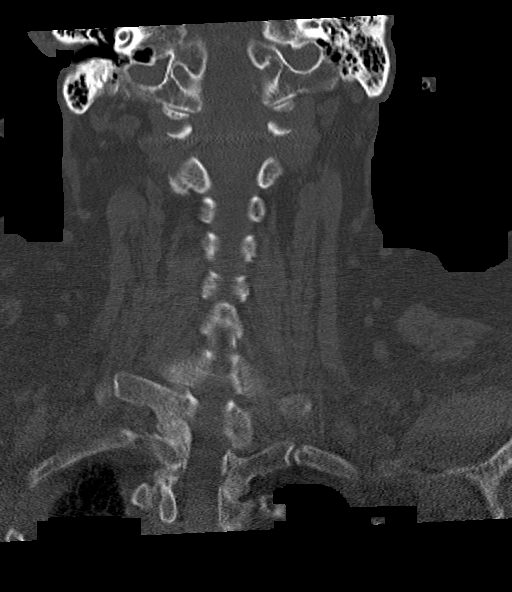

[Series 6: orthogonal bone · axial · 0.33mm/px · z∈[-226,-109]mm · 3 of 111 slices shown, 4 images]
[im 32/111  soft-tissue]
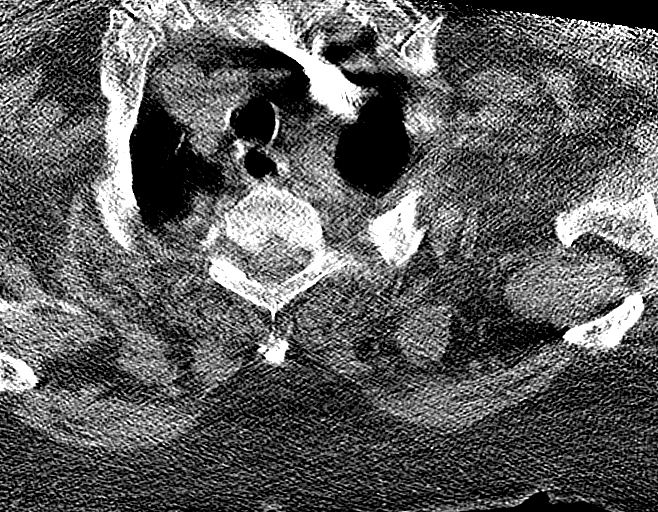
[im 32/111  bone]
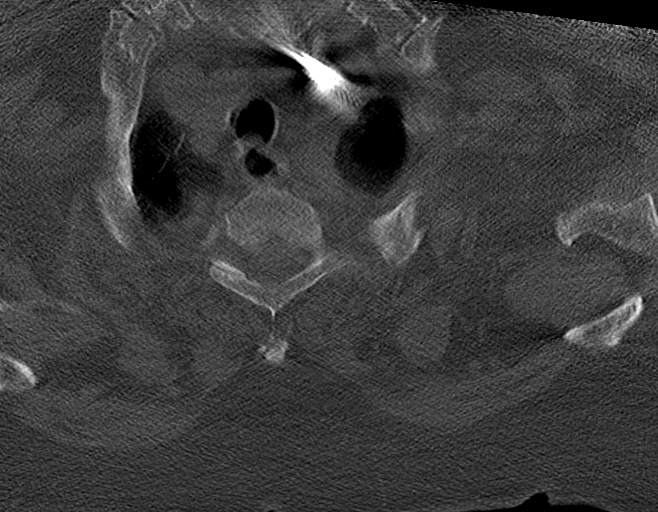
[im 63/111  bone]
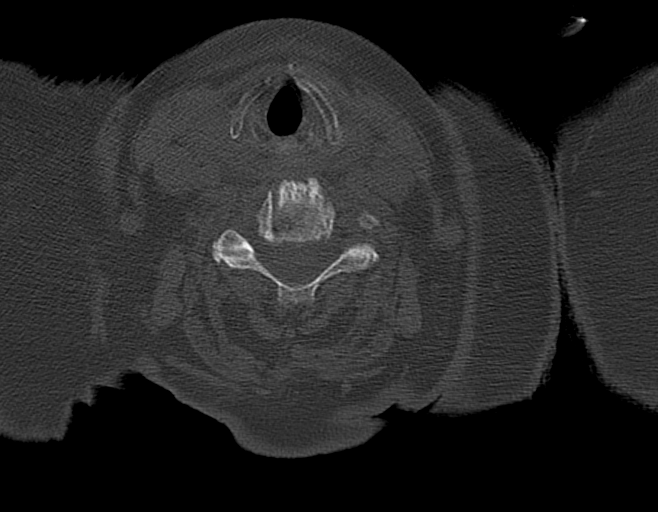
[im 95/111  bone]
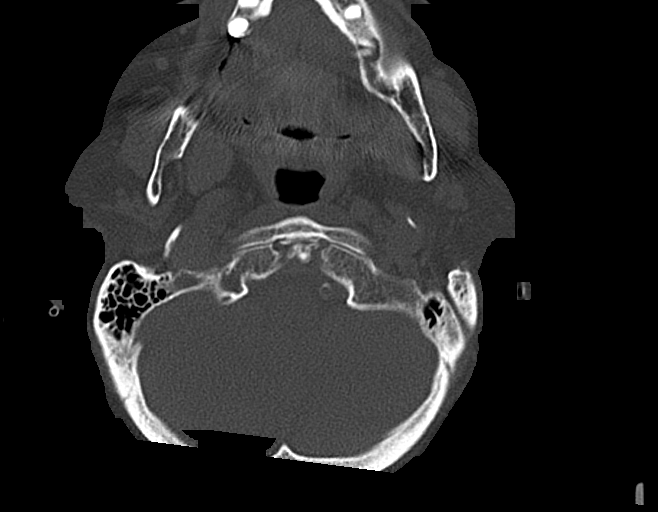

[11 of 33 positions shown; findings below may reference images not displayed]

FINDINGS: Alignment: Anterior cervical discectomy infusion of C5-6 with
instrumentation has been performed with solid incorporation of
interbody bone graft. There is straightening of the cervical spine.
No listhesis.

Skull base and vertebrae: Craniocervical junction is unremarkable.
Atlantal dental interval is normal. No acute fracture of the
cervical spine. No lytic or blastic bone lesion is seen.

Soft tissues and spinal canal: No prevertebral fluid or swelling. No
visible canal hematoma.

Disc levels: Review of the sagittal reformats demonstrates
intervertebral disc space narrowing and endplate remodeling at C4-5
in keeping with changes of moderate degenerative disc disease at
this level. Remaining intervertebral disc heights are preserved.
Vertebral body heights are preserved. Review of the axial images
demonstrates solid incorporation of interbody bone graft resulting
in mild bilateral neural foraminal narrowing at C5-6.

Upper chest: Unremarkable

Other: None significant
IMPRESSION: No acute fracture or listhesis of the cervical spine. Solid fusion
C5-6.

## 2022-02-11 IMAGING — CT CT HEAD W/O CM
3 series · 16 of 47 positions shown, 19 images · non-contrast
Comparison: None.

CLINICAL DATA: Cerebral hemorrhage, fall, head injury

EXAM:
CT HEAD WITHOUT CONTRAST
TECHNIQUE: Contiguous axial images were obtained from the base of the skull
through the vertex without intravenous contrast.

[Series 2: head wo · axial · 0.40mm/px · z∈[-63,+67]mm · 10 of 32 slices shown, 13 images]
[im 3/32  brain]
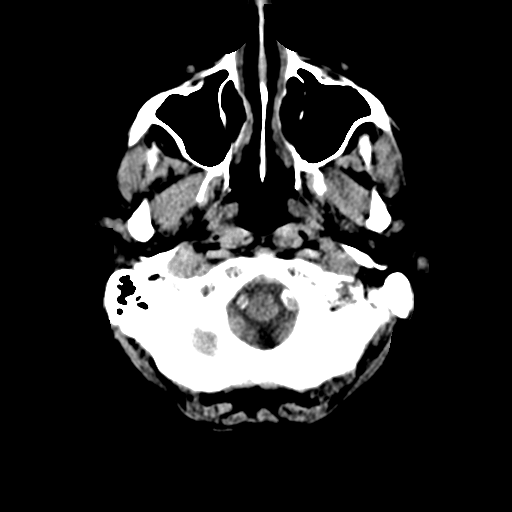
[im 3/32  bone]
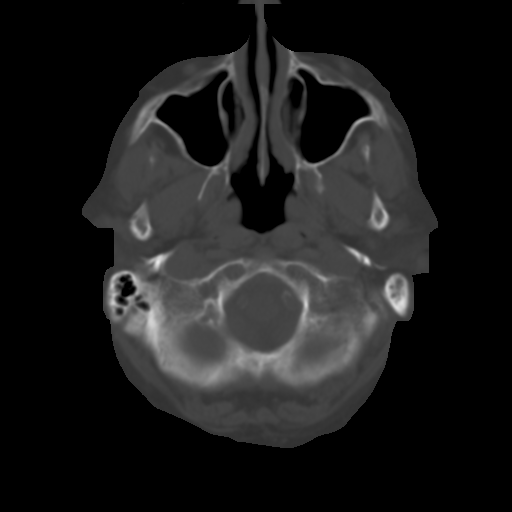
[im 6/32  brain]
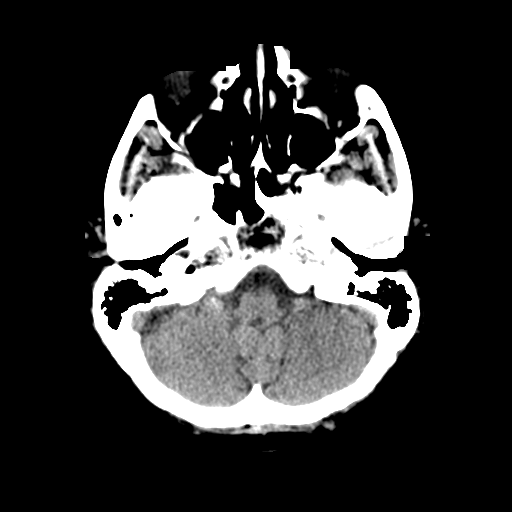
[im 9/32  brain]
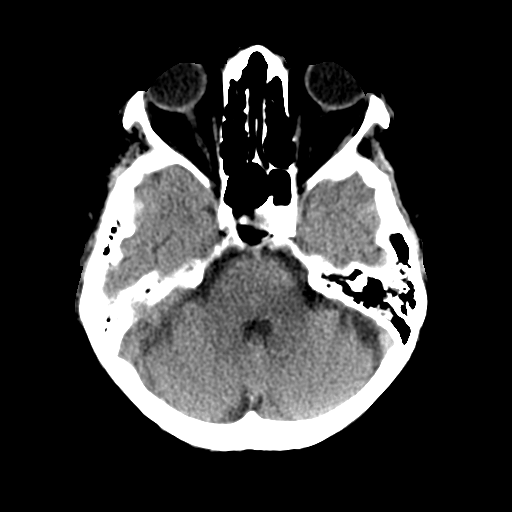
[im 11/32  brain]
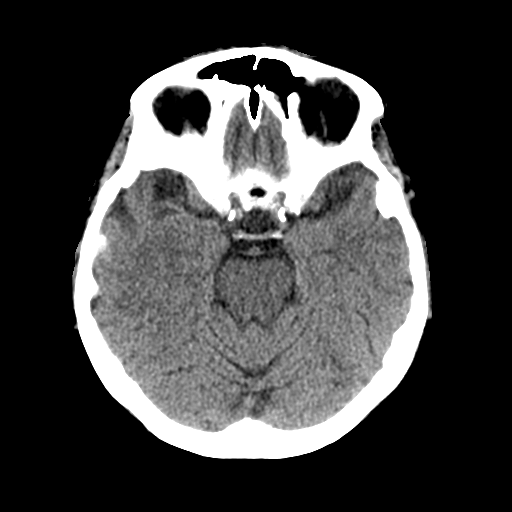
[im 14/32  brain]
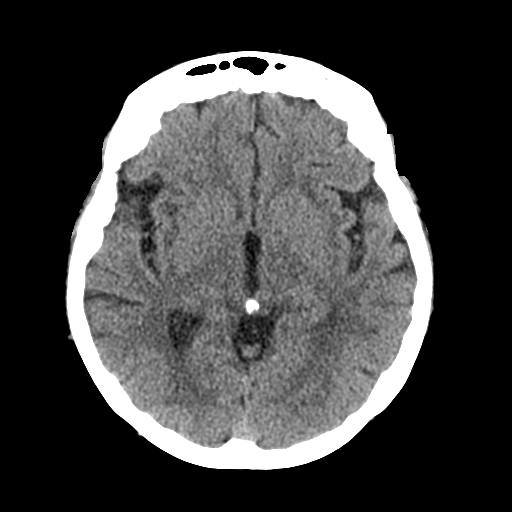
[im 14/32  bone]
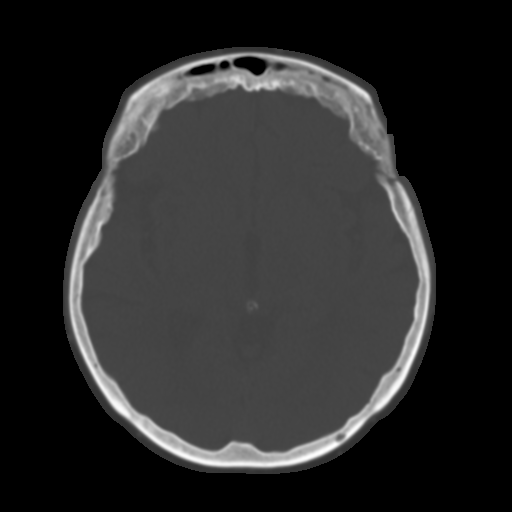
[im 18/32  brain]
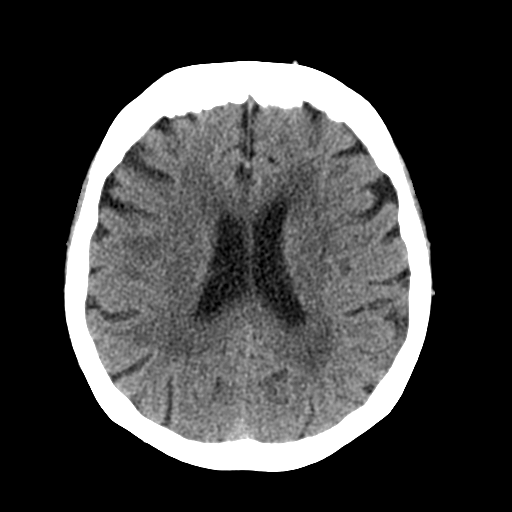
[im 21/32  brain]
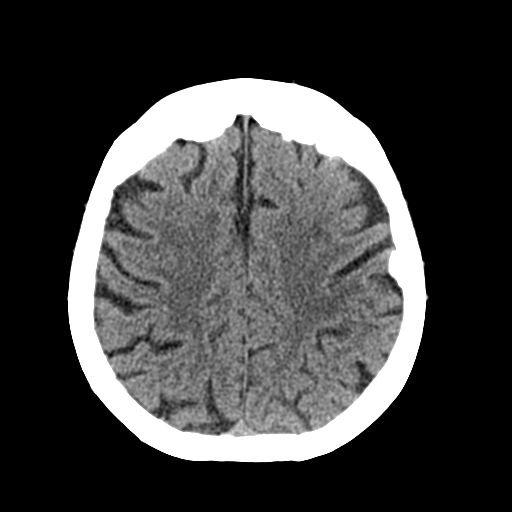
[im 24/32  brain]
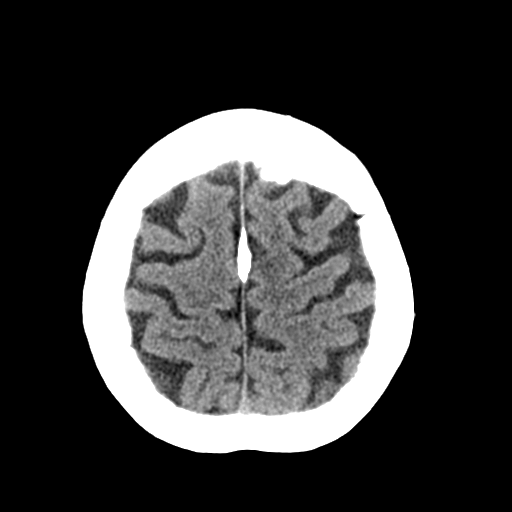
[im 26/32  brain]
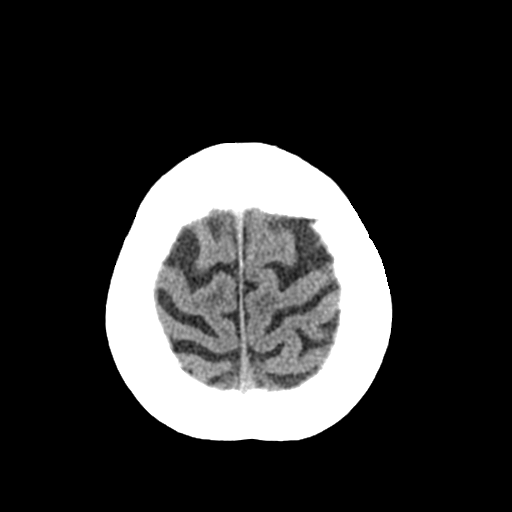
[im 26/32  bone]
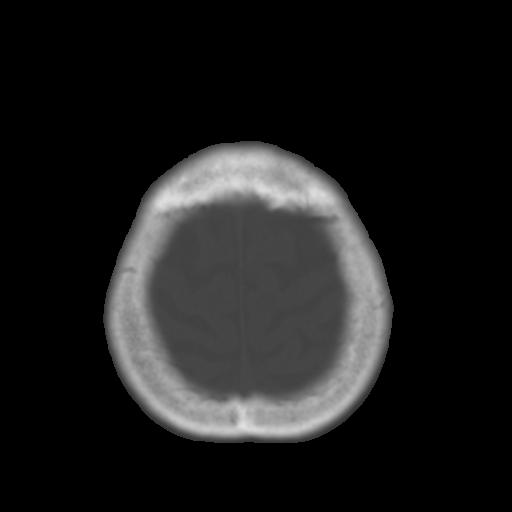
[im 29/32  brain]
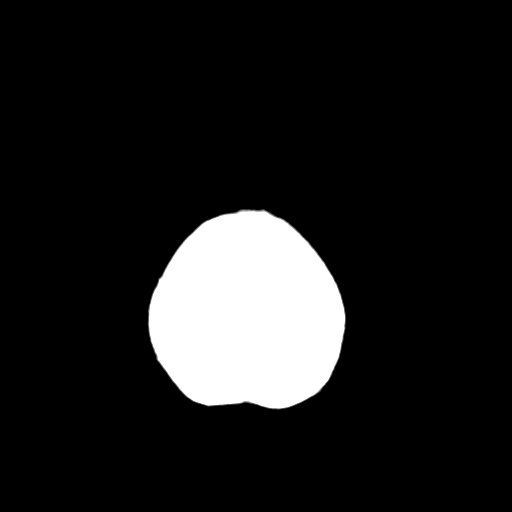

[Series 4: coronal soft tissue · coronal · 0.33mm/px · 3 of 66 slices shown]
[im 25/66  brain]
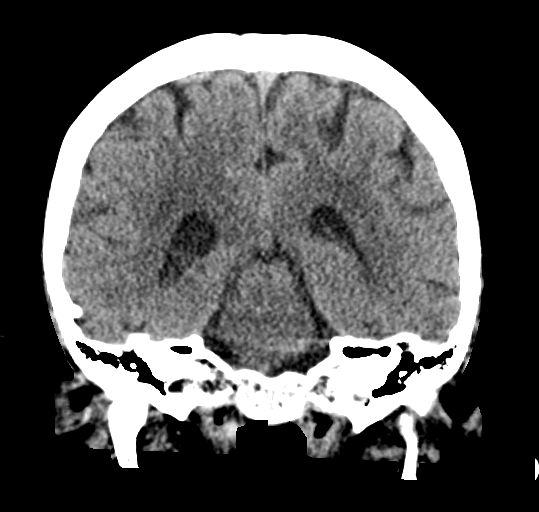
[im 30/66  brain]
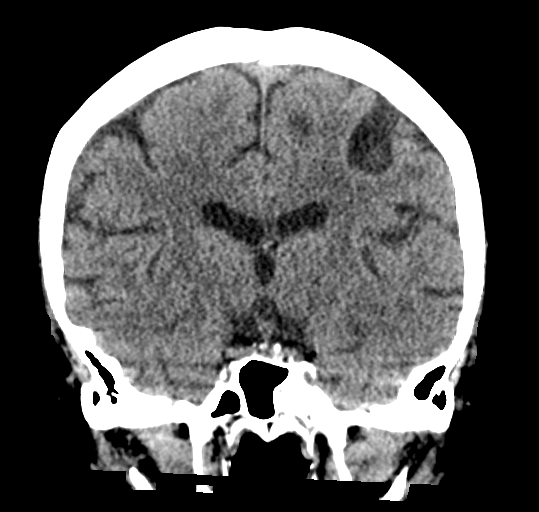
[im 36/66  brain]
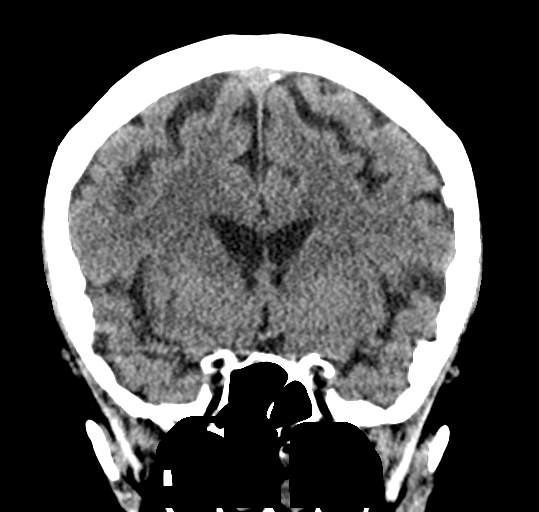

[Series 5: sagittal soft tissue · sagittal · 0.33mm/px · 3 of 59 slices shown]
[im 20/59  brain]
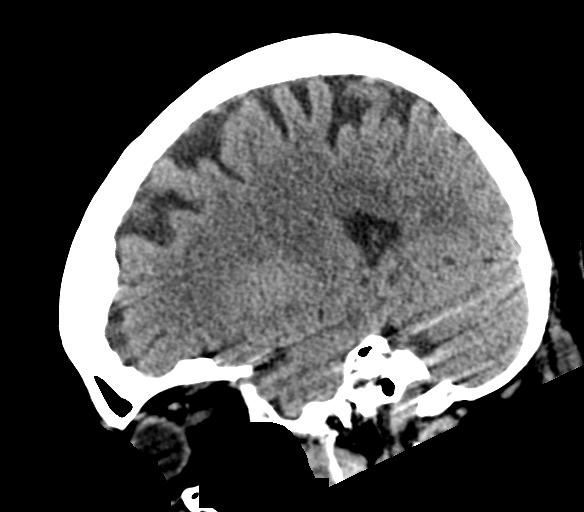
[im 30/59  brain]
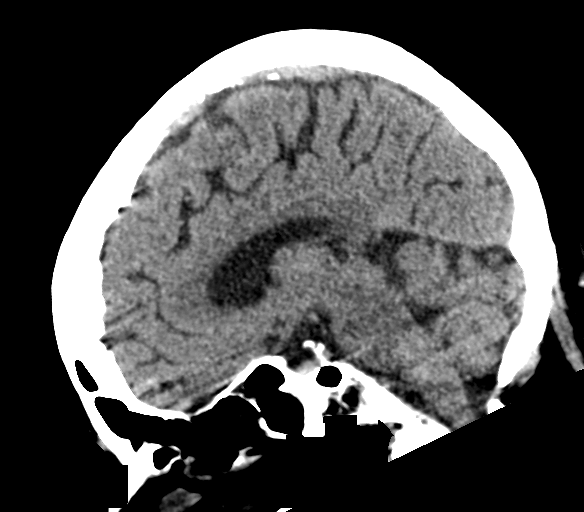
[im 39/59  brain]
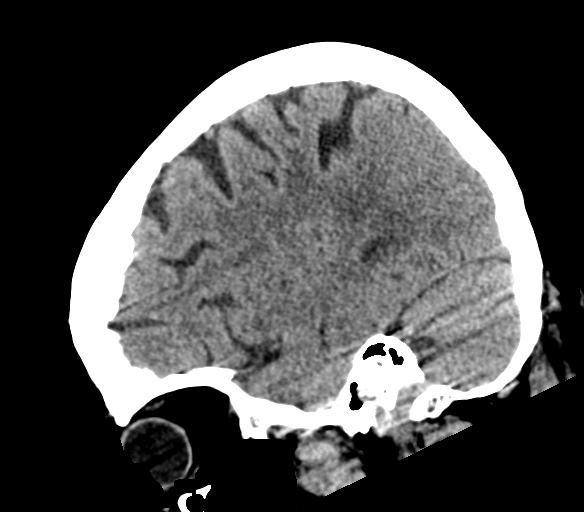

[16 of 47 positions shown; findings below may reference images not displayed]

FINDINGS: Brain: Normal anatomic configuration. Parenchymal volume loss is
commensurate with the patient's age. Mild periventricular white
matter changes are present likely reflecting the sequela of small
vessel ischemia. No abnormal intra or extra-axial mass lesion or
fluid collection. No abnormal mass effect or midline shift. No
evidence of acute intracranial hemorrhage or infarct. Ventricular
size is normal. Cerebellum unremarkable.

Vascular: No asymmetric hyperdense vasculature at the skull base.

Skull: Intact

Sinuses/Orbits: Mild mucosal thickening within the left sphenoid
sinus. Remaining paranasal sinuses are clear. Orbits are
unremarkable.

Other: Mastoid air cells and middle ear cavities are clear.
IMPRESSION: No acute intracranial abnormality.

Mild left sphenoid sinus disease.

## 2023-01-21 ENCOUNTER — Encounter: Payer: Self-pay | Admitting: Internal Medicine

## 2023-02-03 ENCOUNTER — Other Ambulatory Visit: Payer: Self-pay

## 2023-02-03 ENCOUNTER — Ambulatory Visit
Admission: RE | Admit: 2023-02-03 | Discharge: 2023-02-03 | Disposition: A | Discharge status: Home / Self Care | Payer: 59 | Attending: Internal Medicine | Admitting: Internal Medicine

## 2023-02-03 ENCOUNTER — Encounter: Payer: Self-pay | Admitting: Internal Medicine

## 2023-02-03 ENCOUNTER — Encounter
Admission: RE | Disposition: A | Discharge status: Home / Self Care | Payer: Self-pay | Source: Home / Self Care | Attending: Internal Medicine

## 2023-02-03 ENCOUNTER — Ambulatory Visit: Payer: 59 | Admitting: Anesthesiology

## 2023-02-03 DIAGNOSIS — G4730 Sleep apnea, unspecified: Secondary | ICD-10-CM | POA: Insufficient documentation

## 2023-02-03 DIAGNOSIS — R1314 Dysphagia, pharyngoesophageal phase: Secondary | ICD-10-CM | POA: Insufficient documentation

## 2023-02-03 DIAGNOSIS — Z79899 Other long term (current) drug therapy: Secondary | ICD-10-CM | POA: Diagnosis not present

## 2023-02-03 DIAGNOSIS — M1990 Unspecified osteoarthritis, unspecified site: Secondary | ICD-10-CM | POA: Diagnosis not present

## 2023-02-03 DIAGNOSIS — I110 Hypertensive heart disease with heart failure: Secondary | ICD-10-CM | POA: Diagnosis not present

## 2023-02-03 DIAGNOSIS — Z7901 Long term (current) use of anticoagulants: Secondary | ICD-10-CM | POA: Diagnosis not present

## 2023-02-03 DIAGNOSIS — Z7984 Long term (current) use of oral hypoglycemic drugs: Secondary | ICD-10-CM | POA: Diagnosis not present

## 2023-02-03 DIAGNOSIS — R197 Diarrhea, unspecified: Secondary | ICD-10-CM | POA: Diagnosis not present

## 2023-02-03 DIAGNOSIS — E669 Obesity, unspecified: Secondary | ICD-10-CM | POA: Insufficient documentation

## 2023-02-03 DIAGNOSIS — M797 Fibromyalgia: Secondary | ICD-10-CM | POA: Diagnosis not present

## 2023-02-03 DIAGNOSIS — I2510 Atherosclerotic heart disease of native coronary artery without angina pectoris: Secondary | ICD-10-CM | POA: Diagnosis not present

## 2023-02-03 DIAGNOSIS — K222 Esophageal obstruction: Secondary | ICD-10-CM | POA: Diagnosis not present

## 2023-02-03 DIAGNOSIS — Z6841 Body Mass Index (BMI) 40.0 and over, adult: Secondary | ICD-10-CM | POA: Insufficient documentation

## 2023-02-03 DIAGNOSIS — R0602 Shortness of breath: Secondary | ICD-10-CM | POA: Insufficient documentation

## 2023-02-03 DIAGNOSIS — I4891 Unspecified atrial fibrillation: Secondary | ICD-10-CM | POA: Insufficient documentation

## 2023-02-03 DIAGNOSIS — R42 Dizziness and giddiness: Secondary | ICD-10-CM | POA: Diagnosis not present

## 2023-02-03 DIAGNOSIS — K5900 Constipation, unspecified: Secondary | ICD-10-CM | POA: Insufficient documentation

## 2023-02-03 DIAGNOSIS — Z87891 Personal history of nicotine dependence: Secondary | ICD-10-CM | POA: Insufficient documentation

## 2023-02-03 DIAGNOSIS — F419 Anxiety disorder, unspecified: Secondary | ICD-10-CM | POA: Insufficient documentation

## 2023-02-03 DIAGNOSIS — R600 Localized edema: Secondary | ICD-10-CM | POA: Insufficient documentation

## 2023-02-03 DIAGNOSIS — E119 Type 2 diabetes mellitus without complications: Secondary | ICD-10-CM | POA: Diagnosis not present

## 2023-02-03 DIAGNOSIS — I509 Heart failure, unspecified: Secondary | ICD-10-CM | POA: Diagnosis not present

## 2023-02-03 DIAGNOSIS — Z95810 Presence of automatic (implantable) cardiac defibrillator: Secondary | ICD-10-CM | POA: Insufficient documentation

## 2023-02-03 DIAGNOSIS — E785 Hyperlipidemia, unspecified: Secondary | ICD-10-CM | POA: Insufficient documentation

## 2023-02-03 DIAGNOSIS — R0609 Other forms of dyspnea: Secondary | ICD-10-CM | POA: Insufficient documentation

## 2023-02-03 HISTORY — PX: ESOPHAGEAL DILATION: SHX303

## 2023-02-03 HISTORY — PX: ESOPHAGOGASTRODUODENOSCOPY (EGD) WITH PROPOFOL: SHX5813

## 2023-02-03 LAB — GLUCOSE, CAPILLARY: Glucose-Capillary: 130 mg/dL — ABNORMAL HIGH (ref 70–99)

## 2023-02-03 SURGERY — ESOPHAGOGASTRODUODENOSCOPY (EGD) WITH PROPOFOL
Anesthesia: General

## 2023-02-03 MED ORDER — PROPOFOL 10 MG/ML IV BOLUS
INTRAVENOUS | Status: AC
  Filled 2023-02-03: qty 40

## 2023-02-03 MED ORDER — PROPOFOL 10 MG/ML IV BOLUS
INTRAVENOUS | Status: DC | PRN
  Administered 2023-02-03: 20 mg via INTRAVENOUS
  Administered 2023-02-03: 10 mg via INTRAVENOUS
  Administered 2023-02-03: 70 mg via INTRAVENOUS

## 2023-02-03 MED ORDER — LIDOCAINE HCL (CARDIAC) PF 100 MG/5ML IV SOSY
PREFILLED_SYRINGE | INTRAVENOUS | Status: DC | PRN
  Administered 2023-02-03: 50 mg via INTRAVENOUS

## 2023-02-03 MED ORDER — SODIUM CHLORIDE 0.9 % IV SOLN: INTRAVENOUS | Status: DC

## 2023-02-03 NOTE — H&P (Signed)
 Outpatient short stay form Pre-procedure 02/03/2023 9:00 AM Rastus Borton K. Aundria, M.D.  Primary Physician: Norleen Rower, M.D.  Reason for visit:  Dysphagia  History of present illness:  Ms. Narang reports having chronic issues with dysphagia which has worsened recently. She is currently having dysphagia several times a week. She completely avoids rice due to dysphagia but notes it can occur with any solid food. Foods that are tough such as meats are the worst. Will stick in upper esophageal area and have to regurgitate substance back up. She denies any pill or liquid dysphagia. She denies any nausea vomiting. She is not feeling GERD symptoms on PPI 20 mg daily.  She reports a chronic history of alternating constipation and diarrhea, feels each occurs equally. She reports stools can vary on the Bristol stool scale from 2-7. She has not tried thing over-the-counter for the bowel movements. Sometimes has urgency due to liquid stool consistency which can lead to accidents. She denies any bright red blood in stool. No lower abdominal pain.  She denies tobacco alcohol intake. She does take 3 ibuprofen each morning.     Current Facility-Administered Medications:    0.9 %  sodium chloride  infusion, , Intravenous, Continuous, [place], Patric Buckhalter K, MD, Last Rate: 20 mL/hr at 02/03/23 0841, New Bag at 02/03/23 0841  Medications Prior to Admission  Medication Sig Dispense Refill Last Dose/Taking   alendronate  (FOSAMAX ) 70 MG tablet Take 70 mg by mouth every Monday.    02/02/2023   carvedilol  (COREG ) 12.5 MG tablet Take 12.5 mg by mouth 2 (two) times daily with a meal.   02/02/2023   Cholecalciferol  25 MCG (1000 UT) tablet Take 1,000 Units by mouth daily.    02/02/2023   Cyanocobalamin  1000 MCG TBCR Take 1,000 mcg by mouth daily.    02/02/2023   furosemide  (LASIX ) 20 MG tablet Take 20 mg (1 tablet) daily for 3 days, then go back to your previous 10 mg daily. 3 tablet 0 02/02/2023   gabapentin  (NEURONTIN ) 100  MG capsule Take 200 mg by mouth 3 (three) times daily.    02/02/2023   Multiple Vitamins-Minerals (MULTIVITAMIN WITH MINERALS) tablet Take 1 tablet by mouth daily.   02/02/2023   omeprazole (PRILOSEC) 20 MG capsule Take 20 mg by mouth daily.   02/02/2023   pioglitazone  (ACTOS ) 15 MG tablet Take 15 mg by mouth daily.   02/02/2023   rosuvastatin  (CRESTOR ) 10 MG tablet Take 10 mg by mouth daily.   02/02/2023   sertraline  (ZOLOFT ) 100 MG tablet Take 100 mg by mouth daily.   02/02/2023   albuterol  (VENTOLIN  HFA) 108 (90 Base) MCG/ACT inhaler Inhale 2 puffs into the lungs every 4 (four) hours as needed for wheezing or shortness of breath.      benzonatate (TESSALON) 200 MG capsule Take 200 mg by mouth 3 (three) times daily as needed for cough.      diazepam  (VALIUM ) 2 MG tablet Take 1 tablet (2 mg total) by mouth 2 (two) times daily. 5 tablet 0    diclofenac Sodium (VOLTAREN) 1 % GEL Apply 2 g topically daily as needed (for pain).      ELIQUIS  5 MG TABS tablet Take 5 mg by mouth 2 (two) times daily.   02/01/2023 Morning   meclizine  (ANTIVERT ) 25 MG tablet Take 25 mg by mouth 3 (three) times daily as needed for dizziness.      metFORMIN  (GLUCOPHAGE ) 500 MG tablet Take 500 mg by mouth 2 (two) times daily with a meal.  02/01/2023 Morning   polyethylene glycol (MIRALAX  / GLYCOLAX ) 17 g packet Take 17 g by mouth daily.      potassium chloride  SA (KLOR-CON ) 20 MEQ tablet Take 1 tablet (20 mEq total) by mouth daily for 3 days. 3 tablet 0      Allergies  Allergen Reactions   Ciprofloxacin Hcl Itching    Rash and itching all over stomach and arms   Dopamine Nausea And Vomiting   Sulfa Antibiotics Rash   Sulfacetamide Sodium Rash   Tegaderm Ag Mesh [Silver] Other (See Comments) and Rash    Blisters and takes skin off     Past Medical History:  Diagnosis Date   AICD (automatic cardioverter/defibrillator) present    PLACED BY  DR  FERNANDE 2008   Angina    5-6 YRS AGO   Anxiety    Arthritis     Atrial fibrillation (HCC)    CHF (congestive heart failure) (HCC)    Complication of anesthesia    states at times had a tough time waking up   Diabetes mellitus    Diverticulum of esophagus    large diverticulum in the middle third of the esophagus [facility] EGD '09)   Dysrhythmia    A-FIB   Fibromyalgia    Headache(784.0)    Heartburn    History of stomach ulcers    History of transient ischemic attack (TIA)    LAST ONE IN  2005  IN  [place], [place]    HLD (hyperlipidemia)    Hypertension    Shortness of breath    EASING OFF NOW   Sleep apnea    DOESN'T KNOW SETTINGS   Stroke (cerebrum) (HCC)    TIA (transient ischemic attack)     Review of systems:  Otherwise negative.    Physical Exam  Gen: Alert, oriented. Appears stated age.  HEENT: [place]/AT. PERRLA. Lungs: CTA, no wheezes. CV: RR nl S1, S2. Abd: soft, benign, no masses. BS+ Ext: No edema. Pulses 2+    Planned procedures: Proceed with EGD. The patient understands the nature of the planned procedure, indications, risks, alternatives and potential complications including but not limited to bleeding, infection, perforation, damage to internal organs and possible oversedation/side effects from anesthesia. The patient agrees and gives consent to proceed.  Please refer to procedure notes for findings, recommendations and patient disposition/instructions.     Jasyn Mey K. Aundria, M.D. Gastroenterology 02/03/2023  9:00 AM

## 2023-02-03 NOTE — Anesthesia Postprocedure Evaluation (Signed)
 Anesthesia Post Note  Patient: [name]  Procedure(s) Performed: ESOPHAGOGASTRODUODENOSCOPY (EGD) WITH PROPOFOL  ESOPHAGEAL DILATION  Patient location during evaluation: Endoscopy Anesthesia Type: General Level of consciousness: awake and alert Pain management: pain level controlled Vital Signs Assessment: post-procedure vital signs reviewed and stable Respiratory status: spontaneous breathing, nonlabored ventilation and respiratory function stable Cardiovascular status: blood pressure returned to baseline and stable Postop Assessment: no apparent nausea or vomiting Anesthetic complications: no   No notable events documented.   Last Vitals:  Vitals:   02/03/23 0936 02/03/23 0943  BP: 135/78 (!) 148/67  Pulse: 69 74  Resp: (!) 26 16  Temp:    SpO2: 100% 99%    Last Pain:  Vitals:   02/03/23 0943  TempSrc:   PainSc: 0-No pain                 Camellia Merilee Louder

## 2023-02-03 NOTE — Transfer of Care (Signed)
 Immediate Anesthesia Transfer of Care Note  Patient: [name]  Procedure(s) Performed: ESOPHAGOGASTRODUODENOSCOPY (EGD) WITH PROPOFOL  ESOPHAGEAL DILATION  Patient Location: PACU and Endoscopy Unit  Anesthesia Type:General  Level of Consciousness: drowsy and patient cooperative  Airway & Oxygen Therapy: Patient Spontanous Breathing and Patient connected to nasal cannula oxygen  Post-op Assessment: Report given to RN and Post -op Vital signs reviewed and stable  Post vital signs: Reviewed and stable  Last Vitals:  Vitals Value Taken Time  BP 133/73 02/03/23 0926  Temp 36.1 C 02/03/23 0926  Pulse 97 02/03/23 0926  Resp 23 02/03/23 0926  SpO2 98 % 02/03/23 0926    Last Pain:  Vitals:   02/03/23 0926  TempSrc: Temporal  PainSc: 0-No pain         Complications: No notable events documented.

## 2023-02-03 NOTE — Anesthesia Preprocedure Evaluation (Addendum)
 Anesthesia Evaluation  Patient identified by MRN, date of birth, ID band Patient awake    Reviewed: Allergy & Precautions, H&P , NPO status , Patient's Chart, lab work & pertinent test results  Airway Mallampati: II  TM Distance: >3 FB Neck ROM: full    Dental  (+) Missing,    Pulmonary shortness of breath and with exertion, sleep apnea, Continuous Positive Airway Pressure Ventilation and Oxygen sleep apnea , former smoker   Pulmonary exam normal        Cardiovascular Exercise Tolerance: Poor hypertension, (-) angina + CAD and +CHF  (-) Orthopnea + dysrhythmias Atrial Fibrillation + Cardiac Defibrillator + Valvular Problems/Murmurs MR  Rhythm:Regular Rate:Normal + Peripheral Edema ECHO 06/20/22 - Mild LV and RV enlargement. EF 50-55%, normal RV function, moderate TR and MR. Mild AS, Vmax 2.25 m/s. Trivial AR. Severe LA enlargement. Mild RA enlargement.  Last pacemaker interrogation 11/11/22 showed battery longevity of 3.2 years with AT/AF sensing OFF, VP 99.6%. No therapies delivered    stable chronic DOE and BLE edema   Neuro/Psych  PSYCHIATRIC DISORDERS Anxiety     Chronic Vertigo TIA (2005)CVA    GI/Hepatic negative GI ROS, Neg liver ROS,,,  Endo/Other  diabetes, Type 2    Renal/GU negative Renal ROS  negative genitourinary   Musculoskeletal  (+) Arthritis ,  Fibromyalgia -  Abdominal  (+) + obese  Peds  Hematology negative hematology ROS (+)   Anesthesia Other Findings Acute Cystitis treated this month  Past Medical History: No date: AICD (automatic cardioverter/defibrillator) present     Comment:  PLACED BY  DR  FERNANDE 2008 No date: Angina     Comment:  5-6 YRS AGO No date: Anxiety No date: Arthritis No date: Atrial fibrillation (HCC) No date: CHF (congestive heart failure) (HCC) No date: Complication of anesthesia     Comment:  states at times had a tough time waking up No date: Diabetes mellitus No  date: Diverticulum of esophagus     Comment:  large diverticulum in the middle third of the esophagus               ([facility] EGD '09) No date: Dysrhythmia     Comment:  A-FIB No date: Fibromyalgia No date: Headache(784.0) No date: Heartburn No date: History of stomach ulcers No date: History of transient ischemic attack (TIA)     Comment:  LAST ONE IN  2005  IN  [place], [place]  No date: HLD (hyperlipidemia) No date: Hypertension No date: Shortness of breath     Comment:  EASING OFF NOW No date: Sleep apnea     Comment:  DOESN'T KNOW SETTINGS No date: Stroke (cerebrum) (HCC) No date: TIA (transient ischemic attack)  Past Surgical History: 04/09/2011: ANTERIOR CERVICAL DECOMP/DISCECTOMY FUSION     Comment:  Procedure: ANTERIOR CERVICAL DECOMPRESSION/DISCECTOMY               FUSION 1 LEVEL;  Surgeon: Victory DELENA Gunnels, MD;  Location: [facility]              NEURO ORS;  Service: Neurosurgery;  Laterality: N/A;                Cervical five-six Anterior Cervical Decompression and               Fusion No date: APPENDECTOMY No date: BILATERAL OOPHORECTOMY     Comment:  BENIGN No date: CARDIAC CATHETERIZATION     Comment:  X 2    LAST ONE  2009 No date:  CESAREAN SECTION     Comment:  X  4 No date: CHOLECYSTECTOMY 01/18/2019: IMPLANTABLE CARDIOVERTER DEFIBRILLATOR (ICD) GENERATOR  CHANGE; Left     Comment:  Procedure: ICD GENERATOR CHANGE;  Surgeon: Ammon Blunt, MD;  Location: [facility] ORS;  Service:               Cardiovascular;  Laterality: Left; No date: INSERT / REPLACE / REMOVE PACEMAKER     Comment:  ST  JUDE  DEVICE No date: JOINT REPLACEMENT     Comment:  BIL  KNEES No date: TONSILLECTOMY No date: TUBAL LIGATION     Reproductive/Obstetrics negative OB ROS                             Anesthesia Physical Anesthesia Plan  ASA: 3  Anesthesia Plan: General   Post-op Pain Management:    Induction: Intravenous  PONV Risk Score and Plan:  Propofol  infusion and TIVA  Airway Management Planned: Natural Airway and Simple Face Mask  Additional Equipment:   Intra-op Plan:   Post-operative Plan:   Informed Consent: I have reviewed the patients History and Physical, chart, labs and discussed the procedure including the risks, benefits and alternatives for the proposed anesthesia with the patient or authorized representative who has indicated his/her understanding and acceptance.     Dental Advisory Given  Plan Discussed with: CRNA and Surgeon  Anesthesia Plan Comments:         Anesthesia Quick Evaluation

## 2023-02-03 NOTE — Interval H&P Note (Signed)
 History and Physical Interval Note:  02/03/2023 9:01 AM  [name]  has presented today for surgery, with the diagnosis of R13.10 (ICD-10-CM) - Dysphagia, unspecified type.  The various methods of treatment have been discussed with the patient and family. After consideration of risks, benefits and other options for treatment, the patient has consented to  Procedure(s): ESOPHAGOGASTRODUODENOSCOPY (EGD) WITH PROPOFOL  (N/A) as a surgical intervention.  The patient's history has been reviewed, patient examined, no change in status, stable for surgery.  I have reviewed the patient's chart and labs.  Questions were answered to the patient's satisfaction.     Lucien, Ondria Oswald

## 2023-02-03 NOTE — Op Note (Signed)
 [facility] Patient Name: [name] Procedure Date: 02/03/2023 8:58 AM MRN: [id number] Account #: [id number] Date of Birth: 1941-01-12 Admit Type: Outpatient Age: 82 Room: [facility] ENDO ROOM 2 Gender: Female Note Status: Finalized Instrument Name: Upper Endoscope [id number] Procedure:             Upper GI endoscopy Indications:           Esophageal dysphagia Providers:             Keagen Heinlen K. Anam Bobby MD, MD Medicines:             Propofol  per Anesthesia Complications:         No immediate complications. Estimated blood loss: None. Procedure:             Pre-Anesthesia Assessment:                        - The risks and benefits of the procedure and the                         sedation options and risks were discussed with the                         patient. All questions were answered and informed                         consent was obtained.                        - Patient identification and proposed procedure were                         verified prior to the procedure by the nurse. The                         procedure was verified in the procedure room.                        - ASA Grade Assessment: III - A patient with severe                         systemic disease.                        - After reviewing the risks and benefits, the patient                         was deemed in satisfactory condition to undergo the                         procedure.                        After obtaining informed consent, the endoscope was                         passed under direct vision. Throughout the procedure,                         the patient's blood pressure, pulse, and oxygen  saturations were monitored continuously. The Endoscope                         was introduced through the mouth, and advanced to the                         third part of duodenum. The upper GI endoscopy was                         accomplished without  difficulty. The patient tolerated                         the procedure well. Findings:      The examined duodenum was normal.      The stomach was normal.      One benign-appearing, intrinsic mild stenosis was found in the distal       esophagus. This stenosis measured 1.6 cm (inner diameter) x less than       one cm (in length). The stenosis was traversed. The scope was withdrawn.       Dilation was performed with a Maloney dilator with mild resistance at 54       Fr.      The exam of the esophagus was otherwise normal. Impression:            - Normal examined duodenum.                        - Normal stomach.                        - Benign-appearing esophageal stenosis. Dilated.                        - No specimens collected. Recommendation:        - Patient has a contact number available for                         emergencies. The signs and symptoms of potential                         delayed complications were discussed with the patient.                         Return to normal activities tomorrow. Written                         discharge instructions were provided to the patient.                        - Resume previous diet.                        - Continue present medications.                        - Resume Eliquis  (apixaban ) at prior dose tomorrow.                         Refer to managing physician for further adjustment of  therapy.                        - Follow up with Romero Antigua, PA-C at [facility]. [phone].                        - Telephone GI office to schedule appointment in 3                         months.                        - The findings and recommendations were discussed with                         the patient. Procedure Code(s):     --- Professional ---                        [phone], Esophagogastroduodenoscopy, flexible,                         transoral; diagnostic,  including collection of                         specimen(s) by brushing or washing, when performed                         (separate procedure)                        43450, Dilation of esophagus, by unguided sound or                         bougie, single or multiple passes Diagnosis Code(s):     --- Professional ---                        R13.14, Dysphagia, pharyngoesophageal phase                        K22.2, Esophageal obstruction CPT copyright 2022 [employer]. All rights reserved. The codes documented in this report are preliminary and upon coder review may  be revised to meet current compliance requirements. Ladell [name] MD, MD 02/03/2023 9:25:37 AM This report has been signed electronically. Number of Addenda: 0 Note Initiated On: 02/03/2023 8:58 AM Estimated Blood Loss:  Estimated blood loss: none.      [facility]

## 2023-02-05 ENCOUNTER — Encounter: Payer: Self-pay | Admitting: Internal Medicine

## 2023-04-03 ENCOUNTER — Other Ambulatory Visit: Payer: Self-pay

## 2023-04-03 ENCOUNTER — Inpatient Hospital Stay: Payer: 59

## 2023-04-03 ENCOUNTER — Emergency Department: Payer: 59

## 2023-04-03 ENCOUNTER — Inpatient Hospital Stay
Admission: EM | Admit: 2023-04-03 | Discharge: 2023-04-08 | DRG: 689 | Disposition: A | Discharge status: Skilled Nursing Facility | Payer: 59 | Source: Skilled Nursing Facility | Attending: Osteopathic Medicine | Admitting: Osteopathic Medicine

## 2023-04-03 DIAGNOSIS — Z8711 Personal history of peptic ulcer disease: Secondary | ICD-10-CM

## 2023-04-03 DIAGNOSIS — G4730 Sleep apnea, unspecified: Secondary | ICD-10-CM | POA: Diagnosis present

## 2023-04-03 DIAGNOSIS — G9341 Metabolic encephalopathy: Secondary | ICD-10-CM | POA: Diagnosis present

## 2023-04-03 DIAGNOSIS — I2510 Atherosclerotic heart disease of native coronary artery without angina pectoris: Secondary | ICD-10-CM | POA: Diagnosis present

## 2023-04-03 DIAGNOSIS — Z87891 Personal history of nicotine dependence: Secondary | ICD-10-CM

## 2023-04-03 DIAGNOSIS — Z79899 Other long term (current) drug therapy: Secondary | ICD-10-CM

## 2023-04-03 DIAGNOSIS — E119 Type 2 diabetes mellitus without complications: Secondary | ICD-10-CM

## 2023-04-03 DIAGNOSIS — Z66 Do not resuscitate: Secondary | ICD-10-CM | POA: Diagnosis present

## 2023-04-03 DIAGNOSIS — E1159 Type 2 diabetes mellitus with other circulatory complications: Secondary | ICD-10-CM | POA: Diagnosis not present

## 2023-04-03 DIAGNOSIS — Z9981 Dependence on supplemental oxygen: Secondary | ICD-10-CM

## 2023-04-03 DIAGNOSIS — J9611 Chronic respiratory failure with hypoxia: Secondary | ICD-10-CM | POA: Diagnosis present

## 2023-04-03 DIAGNOSIS — Z882 Allergy status to sulfonamides status: Secondary | ICD-10-CM

## 2023-04-03 DIAGNOSIS — B9620 Unspecified Escherichia coli [E. coli] as the cause of diseases classified elsewhere: Secondary | ICD-10-CM | POA: Diagnosis present

## 2023-04-03 DIAGNOSIS — J9612 Chronic respiratory failure with hypercapnia: Secondary | ICD-10-CM | POA: Diagnosis present

## 2023-04-03 DIAGNOSIS — J029 Acute pharyngitis, unspecified: Secondary | ICD-10-CM | POA: Diagnosis present

## 2023-04-03 DIAGNOSIS — G2581 Restless legs syndrome: Secondary | ICD-10-CM | POA: Diagnosis present

## 2023-04-03 DIAGNOSIS — Z6841 Body Mass Index (BMI) 40.0 and over, adult: Secondary | ICD-10-CM

## 2023-04-03 DIAGNOSIS — I452 Bifascicular block: Secondary | ICD-10-CM | POA: Diagnosis present

## 2023-04-03 DIAGNOSIS — N3001 Acute cystitis with hematuria: Secondary | ICD-10-CM | POA: Diagnosis not present

## 2023-04-03 DIAGNOSIS — Z8673 Personal history of transient ischemic attack (TIA), and cerebral infarction without residual deficits: Secondary | ICD-10-CM

## 2023-04-03 DIAGNOSIS — E860 Dehydration: Secondary | ICD-10-CM | POA: Diagnosis present

## 2023-04-03 DIAGNOSIS — E66813 Obesity, class 3: Secondary | ICD-10-CM | POA: Diagnosis present

## 2023-04-03 DIAGNOSIS — Z95810 Presence of automatic (implantable) cardiac defibrillator: Secondary | ICD-10-CM

## 2023-04-03 DIAGNOSIS — Z1152 Encounter for screening for COVID-19: Secondary | ICD-10-CM

## 2023-04-03 DIAGNOSIS — R319 Hematuria, unspecified: Secondary | ICD-10-CM | POA: Diagnosis present

## 2023-04-03 DIAGNOSIS — G928 Other toxic encephalopathy: Secondary | ICD-10-CM | POA: Diagnosis present

## 2023-04-03 DIAGNOSIS — I152 Hypertension secondary to endocrine disorders: Secondary | ICD-10-CM | POA: Diagnosis present

## 2023-04-03 DIAGNOSIS — E785 Hyperlipidemia, unspecified: Secondary | ICD-10-CM | POA: Diagnosis present

## 2023-04-03 DIAGNOSIS — T50915A Adverse effect of multiple unspecified drugs, medicaments and biological substances, initial encounter: Secondary | ICD-10-CM | POA: Diagnosis present

## 2023-04-03 DIAGNOSIS — Z96653 Presence of artificial knee joint, bilateral: Secondary | ICD-10-CM | POA: Diagnosis present

## 2023-04-03 DIAGNOSIS — E876 Hypokalemia: Secondary | ICD-10-CM | POA: Diagnosis present

## 2023-04-03 DIAGNOSIS — M797 Fibromyalgia: Secondary | ICD-10-CM | POA: Diagnosis present

## 2023-04-03 DIAGNOSIS — I5022 Chronic systolic (congestive) heart failure: Secondary | ICD-10-CM | POA: Diagnosis present

## 2023-04-03 DIAGNOSIS — G9340 Encephalopathy, unspecified: Secondary | ICD-10-CM | POA: Diagnosis present

## 2023-04-03 DIAGNOSIS — M542 Cervicalgia: Secondary | ICD-10-CM

## 2023-04-03 DIAGNOSIS — Z993 Dependence on wheelchair: Secondary | ICD-10-CM | POA: Diagnosis not present

## 2023-04-03 DIAGNOSIS — Z7983 Long term (current) use of bisphosphonates: Secondary | ICD-10-CM

## 2023-04-03 DIAGNOSIS — R5383 Other fatigue: Secondary | ICD-10-CM | POA: Diagnosis present

## 2023-04-03 DIAGNOSIS — E861 Hypovolemia: Secondary | ICD-10-CM | POA: Diagnosis present

## 2023-04-03 DIAGNOSIS — R531 Weakness: Secondary | ICD-10-CM | POA: Diagnosis present

## 2023-04-03 DIAGNOSIS — Z7901 Long term (current) use of anticoagulants: Secondary | ICD-10-CM

## 2023-04-03 DIAGNOSIS — F32A Depression, unspecified: Secondary | ICD-10-CM | POA: Diagnosis present

## 2023-04-03 DIAGNOSIS — N390 Urinary tract infection, site not specified: Principal | ICD-10-CM

## 2023-04-03 DIAGNOSIS — Z888 Allergy status to other drugs, medicaments and biological substances status: Secondary | ICD-10-CM

## 2023-04-03 DIAGNOSIS — R4182 Altered mental status, unspecified: Secondary | ICD-10-CM

## 2023-04-03 DIAGNOSIS — Z9049 Acquired absence of other specified parts of digestive tract: Secondary | ICD-10-CM

## 2023-04-03 DIAGNOSIS — I482 Chronic atrial fibrillation, unspecified: Secondary | ICD-10-CM | POA: Diagnosis present

## 2023-04-03 DIAGNOSIS — Z981 Arthrodesis status: Secondary | ICD-10-CM

## 2023-04-03 DIAGNOSIS — I5032 Chronic diastolic (congestive) heart failure: Secondary | ICD-10-CM | POA: Diagnosis present

## 2023-04-03 DIAGNOSIS — E1165 Type 2 diabetes mellitus with hyperglycemia: Secondary | ICD-10-CM | POA: Diagnosis present

## 2023-04-03 DIAGNOSIS — Z881 Allergy status to other antibiotic agents status: Secondary | ICD-10-CM

## 2023-04-03 LAB — RESPIRATORY PANEL BY PCR

## 2023-04-03 LAB — COMPREHENSIVE METABOLIC PANEL
ALT: 8 U/L (ref 0–44)
AST: 20 U/L (ref 15–41)
Albumin: 3.2 g/dL — ABNORMAL LOW (ref 3.5–5.0)
Alkaline Phosphatase: 49 U/L (ref 38–126)
Anion gap: 13 (ref 5–15)
BUN: 28 mg/dL — ABNORMAL HIGH (ref 8–23)
CO2: 37 mmol/L — ABNORMAL HIGH (ref 22–32)
Calcium: 9 mg/dL (ref 8.9–10.3)
Chloride: 88 mmol/L — ABNORMAL LOW (ref 98–111)
Creatinine, Ser: 0.67 mg/dL (ref 0.44–1.00)
GFR, Estimated: >60 mL/min (ref >60)
Glucose, Bld: 153 mg/dL — ABNORMAL HIGH (ref 70–99)
Potassium: 3.4 mmol/L — ABNORMAL LOW (ref 3.5–5.1)
Sodium: 138 mmol/L (ref 135–145)
Total Bilirubin: 1.1 mg/dL (ref 0.0–1.2)
Total Protein: 7.4 g/dL (ref 6.5–8.1)

## 2023-04-03 LAB — URINALYSIS, W/ REFLEX TO CULTURE (INFECTION SUSPECTED)
Bilirubin Urine: NEGATIVE
Glucose, UA: NEGATIVE mg/dL
Ketones, ur: NEGATIVE mg/dL
Nitrite: NEGATIVE
Protein, ur: 100 mg/dL — AB
RBC / HPF: >50 RBC/hpf (ref 0–5)
Specific Gravity, Urine: 1.02 (ref 1.005–1.030)
WBC, UA: >50 WBC/hpf (ref 0–5)
pH: 7 (ref 5.0–8.0)

## 2023-04-03 LAB — CBC WITH DIFFERENTIAL/PLATELET
Abs Immature Granulocytes: 0.07 10*3/uL (ref 0.00–0.07)
Basophils Absolute: 0 10*3/uL (ref 0.0–0.1)
Basophils Relative: 0 %
Eosinophils Absolute: 0 10*3/uL (ref 0.0–0.5)
Eosinophils Relative: 0 %
HCT: 38.7 % (ref 36.0–46.0)
Hemoglobin: 11.9 g/dL — ABNORMAL LOW (ref 12.0–15.0)
Immature Granulocytes: 1 %
Lymphocytes Relative: 9 %
Lymphs Abs: 0.9 10*3/uL (ref 0.7–4.0)
MCH: 26.9 pg (ref 26.0–34.0)
MCHC: 30.7 g/dL (ref 30.0–36.0)
MCV: 87.6 fL (ref 80.0–100.0)
Monocytes Absolute: 1.2 10*3/uL — ABNORMAL HIGH (ref 0.1–1.0)
Monocytes Relative: 11 %
Neutro Abs: 8.8 10*3/uL — ABNORMAL HIGH (ref 1.7–7.7)
Neutrophils Relative %: 79 %
Platelets: 180 10*3/uL (ref 150–400)
RBC: 4.42 MIL/uL (ref 3.87–5.11)
RDW: 15.7 % — ABNORMAL HIGH (ref 11.5–15.5)
WBC: 11.1 10*3/uL — ABNORMAL HIGH (ref 4.0–10.5)
nRBC: 0 % (ref 0.0–0.2)

## 2023-04-03 LAB — BLOOD GAS, VENOUS
Acid-Base Excess: 17.6 mmol/L — ABNORMAL HIGH (ref 0.0–2.0)
Bicarbonate: 45.5 mmol/L — ABNORMAL HIGH (ref 20.0–28.0)
O2 Saturation: 76.6 %
Patient temperature: 37
pCO2, Ven: 67 mm[Hg] — ABNORMAL HIGH (ref 44–60)
pH, Ven: 7.44 — ABNORMAL HIGH (ref 7.25–7.43)
pO2, Ven: 44 mm[Hg] (ref 32–45)

## 2023-04-03 LAB — HEMOGLOBIN A1C
Hgb A1c MFr Bld: 7.1 % — ABNORMAL HIGH (ref 4.8–5.6)
Mean Plasma Glucose: 157.07 mg/dL

## 2023-04-03 LAB — RESP PANEL BY RT-PCR (RSV, FLU A&B, COVID)  RVPGX2
Influenza A by PCR: NEGATIVE
Influenza B by PCR: NEGATIVE
Resp Syncytial Virus by PCR: NEGATIVE
SARS Coronavirus 2 by RT PCR: NEGATIVE

## 2023-04-03 LAB — APTT: aPTT: 37 s — ABNORMAL HIGH (ref 24–36)

## 2023-04-03 LAB — TROPONIN I (HIGH SENSITIVITY)
Troponin I (High Sensitivity): 19 ng/L — ABNORMAL HIGH (ref <18)
Troponin I (High Sensitivity): 21 ng/L — ABNORMAL HIGH (ref <18)

## 2023-04-03 LAB — LACTIC ACID, PLASMA: Lactic Acid, Venous: 1.1 mmol/L (ref 0.5–1.9)

## 2023-04-03 LAB — GROUP A STREP BY PCR: Group A Strep by PCR: NOT DETECTED

## 2023-04-03 LAB — BRAIN NATRIURETIC PEPTIDE: B Natriuretic Peptide: 105.7 pg/mL — ABNORMAL HIGH (ref 0.0–100.0)

## 2023-04-03 LAB — PROCALCITONIN: Procalcitonin: <0.1 ng/mL

## 2023-04-03 LAB — MAGNESIUM: Magnesium: 2.1 mg/dL (ref 1.7–2.4)

## 2023-04-03 LAB — TSH: TSH: 1.899 u[IU]/mL (ref 0.350–4.500)

## 2023-04-03 LAB — PROTIME-INR
INR: 1.2 (ref 0.8–1.2)
Prothrombin Time: 15.7 s — ABNORMAL HIGH (ref 11.4–15.2)

## 2023-04-03 MED ORDER — ACETAMINOPHEN 650 MG RE SUPP: 650.0000 mg | Freq: Four times a day (QID) | RECTAL | Status: DC | PRN

## 2023-04-03 MED ORDER — IOHEXOL 300 MG/ML  SOLN
75.0000 mL | Freq: Once | INTRAMUSCULAR | Status: AC | PRN
  Administered 2023-04-03: 75 mL via INTRAVENOUS

## 2023-04-03 MED ORDER — LACTATED RINGERS IV SOLN: INTRAVENOUS | Status: AC

## 2023-04-03 MED ORDER — ALBUTEROL SULFATE (2.5 MG/3ML) 0.083% IN NEBU: 2.5000 mg | INHALATION_SOLUTION | RESPIRATORY_TRACT | Status: DC | PRN

## 2023-04-03 MED ORDER — SODIUM CHLORIDE 0.9 % IV SOLN
2.0000 g | INTRAVENOUS | Status: DC
  Administered 2023-04-04 – 2023-04-05 (×2): 2 g via INTRAVENOUS
  Filled 2023-04-03 (×2): qty 20

## 2023-04-03 MED ORDER — SODIUM CHLORIDE 0.9 % IV SOLN
2.0000 g | Freq: Once | INTRAVENOUS | Status: AC
  Administered 2023-04-03: 2 g via INTRAVENOUS
  Filled 2023-04-03: qty 20

## 2023-04-03 MED ORDER — SODIUM CHLORIDE 0.9 % IV BOLUS
1000.0000 mL | Freq: Once | INTRAVENOUS | Status: AC
  Administered 2023-04-03: 1000 mL via INTRAVENOUS

## 2023-04-03 MED ORDER — ONDANSETRON HCL 4 MG/2ML IJ SOLN: 4.0000 mg | Freq: Four times a day (QID) | INTRAMUSCULAR | Status: DC | PRN

## 2023-04-03 MED ORDER — ACETAMINOPHEN 325 MG PO TABS
650.0000 mg | ORAL_TABLET | Freq: Four times a day (QID) | ORAL | Status: DC | PRN
  Administered 2023-04-04 – 2023-04-06 (×3): 650 mg via ORAL
  Filled 2023-04-03 (×3): qty 2

## 2023-04-03 MED ORDER — SODIUM CHLORIDE 0.9% FLUSH
3.0000 mL | Freq: Two times a day (BID) | INTRAVENOUS | Status: DC
  Administered 2023-04-04 – 2023-04-08 (×9): 3 mL via INTRAVENOUS

## 2023-04-03 NOTE — Assessment & Plan Note (Signed)
 Patient has a history of chronic respiratory failure on 2 L, with oxygen levels within goal.  Hypercapnia noted, however with compensated bicarb and normal pH.  - Continue home regimen - CPAP at bedtime

## 2023-04-03 NOTE — Assessment & Plan Note (Signed)
 Patient is presenting with significant neck pain on presentation with unknown duration.  CT imaging is negative.  Given encephalopathy, meningitis was considered, however less likely.  Potentially related to prior surgeries, arthritis and fibromyalgia.  However, should patient develop fever, worsening leukocytosis, hemodynamic changes, then low threshold to consider LP.

## 2023-04-03 NOTE — H&P (Addendum)
 History and Physical    Patient: [name]:[id number] DOB: 03/03/1940 DOA: 04/03/2023 DOS: the patient was seen and examined on 04/03/2023 PCP: Gracelyn Nurse, MD  Patient coming from: ALF/ILF  Chief Complaint:  Chief Complaint  Patient presents with   Weakness   HPI: [name] is a 83 y.o. female with medical history significant of CAD, chronic atrial fibrillation s/p CRT-D + AV nodal ablation on Eliquis, hypertension, type 2 diabetes, hyperlipidemia, HFrEF with recovered EF, chronic hypoxic respiratory failure on 2 L, TIA, chronic vertigo, depression, who presents to the ED due to weakness.  History limited due to patient's altered mental status.  She states that she was brought to the ER due to throat pain, but cannot tell me how long her symptoms have been ongoing for.  She denies having any known weakness.  She endorses neck pain and stiffness but cannot tell me when this started or for how long it has been ongoing.  She denies any chest pain, shortness of breath, nausea.  Per chart review, patient presents from [facility] facility due to weakness for 1 week that began to significantly worsened over the past 24 hours.  She is normally alert and oriented x 4, but was only alert to the day at the facility.  They note that she is normally predominantly wheelchair-bound but is able to walk very short distances and to transfer herself.  She has been unable to do those things for the last 24 hours.  Friend at bedside states she seems quite different than usual and is not acting like herself.  She is not sure about what occurred over the last 1 week, but states that facility treated her for UTI approximately 1-1/2 weeks ago.  ED course: On arrival to the ED, patient was hypertensive at 153/69 with heart rate of 70.  She was saturating at 97% on home 2 L.  She was afebrile at 98.1.  Initial workup notable for WBC of 11.1, hemoglobin of 11.9, bicarb 37, glucose 153, BUN 28, creatinine  0.67 with GFR above 60.  BNP 105, troponin 19, procalcitonin less than 0.10 and lactic acid 1.1.  VBG with pH of 7.44 and pCO2 of 67.  COVID-19, influenza and RSV PCR negative.  Urinalysis with large hematuria, moderate leukocytes, many bacteria.  CT head obtained with no acute abnormality.  Chest x-ray obtained with vascular congestion but no overt pulmonary edema.  CT soft tissue of the neck and CT renal stone study ordered and pending.  Patient started on Rocephin and IV fluids.  TRH contacted for admission.  Review of Systems: As mentioned in the history of present illness. All other systems reviewed and are negative.  Past Medical History:  Diagnosis Date   AICD (automatic cardioverter/defibrillator) present    PLACED BY  DR  Graciela Husbands 2008   Angina    5-6 YRS AGO   Anxiety    Arthritis    Atrial fibrillation (HCC)    CHF (congestive heart failure) (HCC)    Complication of anesthesia    states at times had a tough time waking up   Diabetes mellitus    Diverticulum of esophagus    large diverticulum in the middle third of the esophagus [facility] EGD '09)   Dysrhythmia    A-FIB   Fibromyalgia    Headache(784.0)    Heartburn    History of stomach ulcers    History of transient ischemic attack (TIA)    LAST ONE IN  2005  IN  [place], [place]   HLD (hyperlipidemia)    Hypertension    Shortness of breath    EASING OFF NOW   Sleep apnea    DOESN'T KNOW SETTINGS   Stroke (cerebrum) (HCC)    TIA (transient ischemic attack)    Past Surgical History:  Procedure Laterality Date   ANTERIOR CERVICAL DECOMP/DISCECTOMY FUSION  04/09/2011   Procedure: ANTERIOR CERVICAL DECOMPRESSION/DISCECTOMY FUSION 1 LEVEL;  Surgeon: Temple Pacini, MD;  Location: [facility] NEURO ORS;  Service: Neurosurgery;  Laterality: N/A;  Cervical five-six Anterior Cervical Decompression and Fusion   APPENDECTOMY     BILATERAL OOPHORECTOMY     BENIGN   CARDIAC CATHETERIZATION     X 2    LAST ONE  2009   CESAREAN SECTION     X  4    CHOLECYSTECTOMY     ESOPHAGEAL DILATION  02/03/2023   Procedure: ESOPHAGEAL DILATION;  Surgeon: Toledo, Boykin Nearing, MD;  Location: [facility] ENDOSCOPY;  Service: Gastroenterology;;   ESOPHAGOGASTRODUODENOSCOPY (EGD) WITH PROPOFOL N/A 02/03/2023   Procedure: ESOPHAGOGASTRODUODENOSCOPY (EGD) WITH PROPOFOL;  Surgeon: Toledo, Boykin Nearing, MD;  Location: [facility] ENDOSCOPY;  Service: Gastroenterology;  Laterality: N/A;   IMPLANTABLE CARDIOVERTER DEFIBRILLATOR (ICD) GENERATOR CHANGE Left 01/18/2019   Procedure: ICD GENERATOR CHANGE;  Surgeon: Marcina Millard, MD;  Location: [facility] ORS;  Service: Cardiovascular;  Laterality: Left;   INSERT / REPLACE / REMOVE PACEMAKER     ST  JUDE  DEVICE   JOINT REPLACEMENT     BIL  KNEES   TONSILLECTOMY     TUBAL LIGATION     Social History:  reports that she quit smoking about 32 years ago. Her smoking use included cigarettes. She started smoking about 52 years ago. She has a 20 pack-year smoking history. She has never used smokeless tobacco. She reports that she does not drink alcohol and does not use drugs.  Allergies  Allergen Reactions   Ciprofloxacin Hcl Itching    Rash and itching all over stomach and arms   Dopamine Nausea And Vomiting   Sulfa Antibiotics Rash   Sulfacetamide Sodium Rash   Tegaderm Ag Mesh [Silver] Other (See Comments) and Rash    Blisters and takes skin off    Family History  Problem Relation Age of Onset   Kidney disease Neg Hx    Bladder Cancer Neg Hx     Prior to Admission medications   Medication Sig Start Date End Date Taking? Authorizing Provider  albuterol (VENTOLIN HFA) 108 (90 Base) MCG/ACT inhaler Inhale 2 puffs into the lungs every 4 (four) hours as needed for wheezing or shortness of breath.   Yes [provider]  ARIPiprazole (ABILIFY) 5 MG tablet Take 5 mg by mouth daily.   Yes [provider]  atorvastatin (LIPITOR) 20 MG tablet Take 20 mg by mouth daily.   Yes [provider]  busPIRone  (BUSPAR) 15 MG tablet Take 15 mg by mouth 2 (two) times daily.   Yes [provider]  Calcium Carb-Cholecalciferol 600-10 MG-MCG CAPS Take 400 Units by mouth 2 (two) times daily.   Yes [provider]  chlorthalidone (HYGROTON) 25 MG tablet Take 1 tablet by mouth daily. 02/09/23  Yes [provider]  divalproex (DEPAKOTE) 250 MG DR tablet Take 250 mg by mouth 2 (two) times daily.   Yes [provider]  ELIQUIS 5 MG TABS tablet Take 5 mg by mouth 2 (two) times daily. 09/21/19  Yes [provider]  furosemide (LASIX) 20 MG  tablet Take 20 mg (1 tablet) daily for 3 days, then go back to your previous 10 mg daily. 11/02/20  Yes Shaune Pollack, MD  gabapentin (NEURONTIN) 100 MG capsule Take 200 mg by mouth 2 (two) times daily. 11/30/13  Yes [provider]  gabapentin (NEURONTIN) 300 MG capsule Take 300 mg by mouth every evening.   Yes [provider]  loratadine (CLARITIN) 10 MG tablet Take 10 mg by mouth daily.   Yes [provider]  losartan (COZAAR) 50 MG tablet Take 50 mg by mouth daily.   Yes [provider]  omeprazole (PRILOSEC) 20 MG capsule Take 20 mg by mouth daily.   Yes [provider]  sertraline (ZOLOFT) 100 MG tablet Take 100 mg by mouth daily.   Yes [provider]  alendronate (FOSAMAX) 70 MG tablet Take 70 mg by mouth every Monday.    Yes [provider]  amLODipine (NORVASC) 5 MG tablet Take 5 mg by mouth daily.   Yes [provider]  donepezil (ARICEPT) 10 MG tablet Take 10 mg by mouth at bedtime.   Yes [provider]    Physical Exam: Vitals:   04/03/23 1321 04/03/23 1330 04/03/23 1400 04/03/23 1557  BP:  137/81 (!) 153/69   Pulse:  71 70   Resp:  (!) 22 (!) 22   Temp:    98.3 F (36.8 C)  TempSrc:    Oral  SpO2:  94% 97%   Weight: 117.9 kg     Height: 5\' 3"  (1.6 m)      Physical Exam Vitals and nursing note reviewed.  Constitutional:       Appearance: She is obese. She is ill-appearing.  HENT:     Head: Normocephalic and atraumatic.     Mouth/Throat:     Mouth: Mucous membranes are dry.     Comments: Extremely dry oropharynx, with dried sputum.  Otherwise, tonsils appear normal with no erythema, edema or pus.  Uvula is midline.  Oropharynx appears clear Eyes:     Extraocular Movements: Extraocular movements intact.     Pupils: Pupils are equal, round, and reactive to light.  Neck:     Comments: Neck rigidity noted with significant tenderness on palpation, and with any attempted passive movement.  Patient unable to try and actively move her neck. Cardiovascular:     Rate and Rhythm: Normal rate and regular rhythm.     Heart sounds: No murmur heard. Pulmonary:     Effort: Pulmonary effort is normal. No tachypnea.     Breath sounds: Decreased breath sounds (Diminished due to poor effort) present. No wheezing, rhonchi or rales.  Abdominal:     General: Bowel sounds are normal. There is no distension.     Palpations: Abdomen is soft.     Tenderness: There is no abdominal tenderness. There is no guarding.  Musculoskeletal:     Right lower leg: No edema.     Left lower leg: No edema.  Skin:    General: Skin is warm and dry.  Neurological:     Mental Status: She is alert.     Comments:  Patient is alert and oriented to person, place but states it is 2024.   Not able to provide details on why she is in the hospital and struggles to follow commands.   No dysarthria or facial asymmetry Extraocular movements are intact but struggled quite a bit with doing the task.   Weakness noted throughout 2/5 but equal throughout.  Sensation grossly intact throughout.    Data Reviewed: CBC with WBC of 11.1, hemoglobin of 11.9, MCV of 87.6, platelets of 180 CMP with sodium of 138, potassium 3.4, chloride 88, bicarb 37, glucose 153, BUN 28, creatinine 0.67, AST 20, ALT 8 GFR above 60 BNP 105 Troponin 19 and then 21 INR 1.2 COVID-19,  influenza and RSV PCR negative Group A strep negative Urinalysis with large hematuria, moderate leukocytes, proteinuria, many bacteria.  Hyaline casts noted  EKG personally reviewed.  Atrial sensing and ventricular pacing noted.   CT Renal Stone Study Result Date: 04/03/2023 CLINICAL DATA:  Altered mental status. EXAM: CT ABDOMEN AND PELVIS WITHOUT CONTRAST TECHNIQUE: Multidetector CT imaging of the abdomen and pelvis was performed following the standard protocol without IV contrast. RADIATION DOSE REDUCTION: This exam was performed according to the departmental dose-optimization program which includes automated exposure control, adjustment of the mA and/or kV according to patient size and/or use of iterative reconstruction technique. COMPARISON:  CT abdomen pelvis dated July 11, 2020. FINDINGS: Lower chest: No acute abnormality. Chronic cardiomegaly. Subsegmental atelectasis at both lung bases. Hepatobiliary: No focal liver abnormality is seen, other than unchanged punctate calcified granulomas. Status post cholecystectomy. No biliary dilatation. Pancreas: Unremarkable. No pancreatic ductal dilatation or surrounding inflammatory changes. Spleen: Normal in size without focal abnormality. Punctate calcified granulomas again noted. Adrenals/Urinary Tract: The adrenal glands are unremarkable. Multiple left renal simple cysts again noted. No follow-up imaging is recommended. Unchanged punctate calculus in the midpole of the right kidney. No ureteral calculi or hydronephrosis. The bladder is unremarkable. Stomach/Bowel: Stomach is within normal limits. Appendix appears normal. No evidence of bowel wall thickening, distention, or inflammatory changes. Left-sided colonic diverticulosis. Vascular/Lymphatic: Aortic atherosclerosis. No enlarged abdominal or pelvic lymph nodes. Reproductive: Uterus and bilateral adnexa are unremarkable. Other: No free fluid or pneumoperitoneum. Musculoskeletal: No acute or significant  osseous findings. IMPRESSION: 1. No acute intra-abdominal process. 2. Unchanged punctate nonobstructive right nephrolithiasis. 3.  Aortic Atherosclerosis (ICD10-I70.0). Electronically Signed   By: Obie Dredge M.D.   On: 04/03/2023 16:50   CT Soft Tissue Neck W Contrast Result Date: 04/03/2023 CLINICAL DATA:  Sore throat.  Altered mental status. EXAM: CT NECK WITH CONTRAST TECHNIQUE: Multidetector CT imaging of the neck was performed using the standard protocol following the bolus administration of intravenous contrast. RADIATION DOSE REDUCTION: This exam was performed according to the departmental dose-optimization program which includes automated exposure control, adjustment of the mA and/or kV according to patient size and/or use of iterative reconstruction technique. CONTRAST:  75mL OMNIPAQUE IOHEXOL 300 MG/ML  SOLN COMPARISON:  None Available. FINDINGS: Pharynx and larynx: No focal mucosal or submucosal lesions are present. Nasopharynx is clear. Soft palate and tongue base are within normal limits. Posterior oropharynx is unremarkable. Palatine tonsils are within normal limits. Vallecula and epiglottis are within normal limits. Aryepiglottic folds and piriform sinuses are clear. Vocal cords are midline and symmetric. Trachea is clear. Salivary glands: The submandibular and parotid glands and ducts are within normal limits. Thyroid: Normal Lymph nodes: No significant adenopathy is present. Vascular: Atherosclerotic calcifications are present the carotid bifurcations bilaterally. No focal stenosis is present. Limited intracranial: Within normal limits. Visualized orbits: Bilateral lens replacements are noted. Globes and orbits are otherwise unremarkable. Mastoids and visualized paranasal sinuses: Mild mucosal thickening is present the left maxillary sinus. Skeleton: Vertebral body heights and alignment are normal. Solid fusion present at C5-6. No focal osseous lesions are present. Upper chest: Dependent  atelectasis is present bilaterally. The lungs are otherwise clear.  No significant pleural effusion or pneumothorax is present. Atherosclerotic changes are present at the aortic arch. IMPRESSION: 1. No acute or focal lesion to explain the patient's symptoms. 2. Solid fusion at C5-6. 3. Mild left maxillary sinus disease. 4.  Aortic Atherosclerosis (ICD10-I70.0). Electronically Signed   By: Marin Roberts M.D.   On: 04/03/2023 16:48   CT HEAD WO CONTRAST ( ) Result Date: 04/03/2023 CLINICAL DATA:  Headache, new onset. Weakness for 1 week with progression today. EXAM: CT HEAD WITHOUT CONTRAST TECHNIQUE: Contiguous axial images were obtained from the base of the skull through the vertex without intravenous contrast. RADIATION DOSE REDUCTION: This exam was performed according to the departmental dose-optimization program which includes automated exposure control, adjustment of the mA and/or kV according to patient size and/or use of iterative reconstruction technique. COMPARISON:  CT head without contrast 07/11/2020. FINDINGS: Brain: No acute infarct, hemorrhage, or mass lesion is present. Moderate diffuse white matter changes demonstrates some progression. Basal ganglia are intact. Insular ribbon is normal. The brainstem and cerebellum are within normal limits. 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 Vascular: Atherosclerotic calcifications are present within the cavernous internal carotid arteries and at the dural margin of both vertebral arteries. No hyperdense vessel is present. Skull: Calvarium is intact. No focal lytic or blastic lesions are present. No significant extracranial soft tissue lesion is present. Sinuses/Orbits: Bilateral lens replacements are noted. Globes and orbits are otherwise unremarkable. The globes and orbits are within normal limits. IMPRESSION: 1. No acute intracranial abnormality or significant interval change. 2. Moderate diffuse white matter changes demonstrate some progression. This  likely reflects the sequela of chronic microvascular ischemia. 3. Enlarged empty sella. This is nonspecific, but can be seen in the setting of idiopathic intracranial hypertension. Electronically Signed   By: Marin Roberts M.D.   On: 04/03/2023 14:53   DG Chest Port 1 View Result Date: 04/03/2023 CLINICAL DATA:  Questionable sepsis.  Weakness for 1 week. EXAM: PORTABLE CHEST 1 VIEW COMPARISON:  Radiographs 11/02/2020 and 07/11/2020.  CT 10/25/2019. FINDINGS: 1306 hours. Mild patient rotation to the left. Grossly stable cardiomegaly, aortic atherosclerosis and central enlargement of the pulmonary arteries. Left subclavian AICD leads appear unchanged. There is vascular congestion without overt pulmonary edema. No confluent airspace disease, pneumothorax or significant pleural effusion. No acute osseous findings are seen. IMPRESSION: Cardiomegaly with vascular congestion. No overt pulmonary edema or focal airspace disease. Electronically Signed   By: Carey Bullocks M.D.   On: 04/03/2023 14:24   Results are pending, will review when available.  Assessment and Plan:  * Acute encephalopathy Patient was sent to the ED from facility due to increasing generalized weakness for 1 week in addition to altered mental status.  She is alert and oriented x 2, however struggles significantly to follow commands and cannot provide any details over her recent history/symptoms.  No focal neurological deficits to suggest CVA.  Given encephalopathy with neck pain and stiffness, considered meningitis, however she is afebrile with only minimally elevated WBC and otherwise hemodynamically stable.  This would make meningitis less likely.  Overall, most likely etiology at this point is underlying UTI with associated dehydration.  Will also check for other respiratory viral illnesses.   - Telemetry monitoring - Respiratory viral panel pending - Urine culture pending - Continue Rocephin 2 g daily - N.p.o. pending  improvement in mental status - IV fluids as ordered  Urinary tract infection UA with hematuria and pyuria with AMS, concerning for UTI. Patient is unable to contribute any history.  No evidence of sepsis at this time.  CT stone negative for nephrolithiasis.  - Urine + blood cultures pending - Continue Rocephin 2 g daily  Neck pain Patient is presenting with significant neck pain on presentation with unknown duration.  CT imaging is negative.  Given encephalopathy, meningitis was considered, however less likely.  Potentially related to prior surgeries, arthritis and fibromyalgia.  However, should patient develop fever, worsening leukocytosis, hemodynamic changes, then low threshold to consider LP.  Chronic respiratory failure with hypoxia and hypercapnia (HCC) Patient has a history of chronic respiratory failure on 2 L, with oxygen levels within goal.  Hypercapnia noted, however with compensated bicarb and normal pH.  - Continue home regimen - CPAP at bedtime  Chronic HFmrEF w/ recovered EF History of heart failure with moderately reduced EF, with recovered EF.  Last echo in May 2024 demonstrating LVEF of 50-55%.  She appears hypovolemic at this time and BNP is less than previous measurements.  - Daily weights - Hold home regimen  Depression - Hold home regimen pending improvement in mental status  Hypertension associated with diabetes (HCC) - Hold home regimen pending improvement in mental status  Type 2 diabetes mellitus (HCC) Diet controlled type 2 diabetes, with glucose elevated at 153.  - CBG monitoring - A1c pending  Chronic atrial fibrillation (HCC) Chronic atrial fibrillation, now with ventricular pacing.  - Resume home regimen when able to swallow medications  Advance Care Planning:   Code Status: Limited: Do not attempt resuscitation (DNR) -DNR-LIMITED -Do Not Intubate/DNI Gold form signed at bedside.  Patient's friend confirms that these have always been her  wishes.  Consults: None  Family Communication: Patient's friend [name]) updated at bedside.  She is actively updating patient's son.  Severity of Illness: The appropriate patient status for this patient is INPATIENT. Inpatient status is judged to be reasonable and necessary in order to provide the required intensity of service to ensure the patient's safety. The patient's presenting symptoms, physical exam findings, and initial radiographic and laboratory data in the context of their chronic comorbidities is felt to place them at high risk for further clinical deterioration. Furthermore, it is not anticipated that the patient will be medically stable for discharge from the hospital within 2 midnights of admission.   * I certify that at the point of admission it is my clinical judgment that the patient will require inpatient hospital care spanning beyond 2 midnights from the point of admission due to high intensity of service, high risk for further deterioration and high frequency of surveillance required.*  Author: Verdene Lennert, MD 04/03/2023 6:33 PM  For on call review [email or web address].

## 2023-04-03 NOTE — Assessment & Plan Note (Signed)
 Patient was sent to the ED from facility due to increasing generalized weakness for 1 week in addition to altered mental status.  She is alert and oriented x 2, however struggles significantly to follow commands and cannot provide any details over her recent history/symptoms.  No focal neurological deficits to suggest CVA.  Given encephalopathy with neck pain and stiffness, considered meningitis, however she is afebrile with only minimally elevated WBC and otherwise hemodynamically stable.  This would make meningitis less likely.  Overall, most likely etiology at this point is underlying UTI with associated dehydration.  Will also check for other respiratory viral illnesses.   - Telemetry monitoring - Respiratory viral panel pending - Urine culture pending - Continue Rocephin 2 g daily - N.p.o. pending improvement in mental status - IV fluids as ordered

## 2023-04-03 NOTE — ED Notes (Signed)
 This RN contacted lab to check on status of cbc collected at 1259 and not resulted at 1528. Lab states need another lavender top.

## 2023-04-03 NOTE — ED Provider Notes (Addendum)
 [facility] Provider Note    Event Date/Time   First MD Initiated Contact with Patient 04/03/23 1247     (approximate)   History   Weakness   HPI  [name] is a 83 y.o. female with history of A-fib on Eliquis, diabetes who comes in with concerns for altered mental status.  Last known normal was sometime maybe yesterday morning.  She has had increasing confusion, fatigue, not acting her normal self.  She does report a sore throat.  She has been more weak.  She typically uses a wheelchair.      Physical Exam   Triage Vital Signs: ED Triage Vitals  Encounter Vitals Group     BP      Systolic BP Percentile      Diastolic BP Percentile      Pulse      Resp      Temp      Temp src      SpO2      Weight      Height      Head Circumference      Peak Flow      Pain Score      Pain Loc      Pain Education      Exclude from Growth Chart     Most recent vital signs: Vitals:   04/03/23 1330 04/03/23 1400  BP: 137/81 (!) 153/69  Pulse: 71 70  Resp: (!) 22 (!) 22  Temp:    SpO2: 94% 97%     General: Awake, no distress.  CV:  Good peripheral perfusion.  Resp:  Normal effort.  Abd:  No distention.  Other:  Wiggles toes bilaterally squeezes hands.  Alert and oriented x 1.  Abdomen soft and nontender.  On her baseline 2 L of oxygen. OP is clear  ED Results / Procedures / Treatments   Labs (all labs ordered are listed, but only abnormal results are displayed) Labs Reviewed  COMPREHENSIVE METABOLIC PANEL - Abnormal; Notable for the following components:      Result Value   Potassium 3.4 (*)    Chloride 88 (*)    CO2 37 (*)    Glucose, Bld 153 (*)    BUN 28 (*)    Albumin 3.2 (*)    All other components within normal limits  PROTIME-INR - Abnormal; Notable for the following components:   Prothrombin Time 15.7 (*)    All other components within normal limits  APTT - Abnormal; Notable for the following components:   aPTT 37 (*)     All other components within normal limits  URINALYSIS, W/ REFLEX TO CULTURE (INFECTION SUSPECTED) - Abnormal; Notable for the following components:   Color, Urine YELLOW (*)    APPearance CLOUDY (*)    Hgb urine dipstick LARGE (*)    Protein, ur 100 (*)    Leukocytes,Ua MODERATE (*)    Bacteria, UA MANY (*)    All other components within normal limits  BRAIN NATRIURETIC PEPTIDE - Abnormal; Notable for the following components:   B Natriuretic Peptide 105.7 (*)    All other components within normal limits  BLOOD GAS, VENOUS - Abnormal; Notable for the following components:   pH, Ven 7.44 (*)    pCO2, Ven 67 (*)    Bicarbonate 45.5 (*)    Acid-Base Excess 17.6 (*)    All other components within normal limits  TROPONIN I (HIGH SENSITIVITY) - Abnormal; Notable for  the following components:   Troponin I (High Sensitivity) 19 (*)    All other components within normal limits  GROUP A STREP BY PCR  RESP PANEL BY RT-PCR (RSV, FLU A&B, COVID)  RVPGX2  CULTURE, BLOOD (ROUTINE X 2)  CULTURE, BLOOD (ROUTINE X 2)  URINE CULTURE  LACTIC ACID, PLASMA  MAGNESIUM  PROCALCITONIN  LACTIC ACID, PLASMA  CBC WITH DIFFERENTIAL/PLATELET  CBC WITH DIFFERENTIAL/PLATELET  TROPONIN I (HIGH SENSITIVITY)     EKG  My interpretation of EKG:  EKG shows sinus rate of 72 with right bundle branch block and left posterior fascicular block..  Reviewed patient's prior EKG and she has a paced rhythm  RADIOLOGY I have reviewed the xray personally and interpreted and no focal pneumonia  PROCEDURES:  Critical Care performed: No  .1-3 Lead EKG Interpretation  Performed by: Concha Se, MD Authorized by: Concha Se, MD     Interpretation: normal     ECG rate:  70   ECG rate assessment: normal     Rhythm: paced     Ectopy: none     Conduction: normal      MEDICATIONS ORDERED IN ED: Medications  cefTRIAXone (ROCEPHIN) 2 g in sodium chloride 0.9 % 100 mL IVPB (2 g Intravenous New Bag/Given  04/03/23 1509)  sodium chloride 0.9 % bolus 1,000 mL (0 mLs Intravenous Stopped 04/03/23 1454)     IMPRESSION / MDM / ASSESSMENT AND PLAN / ED COURSE  I reviewed the triage vital signs and the nursing notes.   Patient's presentation is most consistent with acute presentation with potential threat to life or bodily function.   Patient comes in with altered mental status gradually increasing over the past week but most notable over the past 24 hours.  Patient out of the window for stroke code.  On examination she does has nonfocal examination.  No obvious stroke noted.  CT head ordered given she is on blood thinner to make sure no evidence of intracranial hemorrhage given altered mental status.  Blood work, labs ordered.  Suspect more likely viral cause of a sore throat I do not see any obvious swelling, redness and no fevers.  CMP shows elevated CO2.'s did get a VBG that just shows chronic retention.  Urine looks consistent with UTI will send for urine culture.  Lactate normal coags normal troponin slightly elevated we will continue to trend out.  Given patient's altered mental status I will discuss the hospitalist for admission and reevaluate patient and she denies any back pain, abdominal pain, history of kidney stones.  Patient afebrile.  Do suspect this is more likely UTI but I did discuss with the hospitalist and they will evaluate patient to see if CT renal scan is needed but at this time I have lower suspicion given the above.  Pt reported sore throat- but also having neck pain (to me she stated chronic neck pain) but was tender on anterior neck.  Per family now in room per hospitalist recommended further workup- added on ct neck and ct renal for possibel kidney sotne. Possible meningitis- viral? Given negative procalc although her UTI could be causng AMS_ will start off with CT neck first given sore throat and may need LP if no other explanation of neck pain/sore throat.  Unlikelty bacterial  given no fever  cbc still pending procalc negative.  Discussed with hospitalist and seh will f/u with CT scans and discussing IR for LP as needed. holding antibitics at this time except for  uti..    The patient is on the cardiac monitor to evaluate for evidence of arrhythmia and/or significant heart rate changes.      FINAL CLINICAL IMPRESSION(S) / ED DIAGNOSES   Final diagnoses:  Urinary tract infection without hematuria, site unspecified  Altered mental status, unspecified altered mental status type     Rx / DC Orders   ED Discharge Orders     None        Note:  This document was prepared using Dragon voice recognition software and may include unintentional dictation errors.   Concha Se, MD 04/03/23 1539    Concha Se, MD 04/03/23 [phone]

## 2023-04-03 NOTE — ED Triage Notes (Addendum)
 Pt arrives via [facility] from [facility]. C/o weakness x 1 week but states it got worse today. Per facility, pt normally  a and o x 4 at baseline. Pt alert to day only. Wears 2 liters oxygen at baseline.

## 2023-04-03 NOTE — ED Notes (Signed)
 X-ray at bedside

## 2023-04-03 NOTE — ED Notes (Signed)
 Pt resting comfortably in bed at this time. Pt is alert and oriented x 3. Disoriented to time. No acute distress noted. Pt denies any needs at this time. Call light within reach. Pts friend remains at bedside.

## 2023-04-03 NOTE — Assessment & Plan Note (Signed)
-   Hold home regimen pending improvement in mental status

## 2023-04-03 NOTE — Assessment & Plan Note (Signed)
 Chronic atrial fibrillation, now with ventricular pacing.  - Resume home regimen when able to swallow medications

## 2023-04-03 NOTE — Assessment & Plan Note (Signed)
 Diet controlled type 2 diabetes, with glucose elevated at 153.  - CBG monitoring - A1c pending

## 2023-04-03 NOTE — Assessment & Plan Note (Signed)
 History of heart failure with moderately reduced EF, with recovered EF.  Last echo in May 2024 demonstrating LVEF of 50-55%.  She appears hypovolemic at this time and BNP is less than previous measurements.  - Daily weights - Hold home regimen

## 2023-04-03 NOTE — Assessment & Plan Note (Signed)
 UA with hematuria and pyuria with AMS, concerning for UTI. Patient is unable to contribute any history.  No evidence of sepsis at this time.  CT stone negative for nephrolithiasis.  - Urine + blood cultures pending - Continue Rocephin 2 g daily

## 2023-04-04 DIAGNOSIS — G9340 Encephalopathy, unspecified: Secondary | ICD-10-CM | POA: Diagnosis not present

## 2023-04-04 LAB — CBC WITH DIFFERENTIAL/PLATELET
Abs Immature Granulocytes: 0.05 10*3/uL (ref 0.00–0.07)
Basophils Absolute: 0 10*3/uL (ref 0.0–0.1)
Basophils Relative: 0 %
Eosinophils Absolute: 0 10*3/uL (ref 0.0–0.5)
Eosinophils Relative: 0 %
HCT: 38.6 % (ref 36.0–46.0)
Hemoglobin: 11.7 g/dL — ABNORMAL LOW (ref 12.0–15.0)
Immature Granulocytes: 1 %
Lymphocytes Relative: 14 %
Lymphs Abs: 1.2 10*3/uL (ref 0.7–4.0)
MCH: 26.8 pg (ref 26.0–34.0)
MCHC: 30.3 g/dL (ref 30.0–36.0)
MCV: 88.5 fL (ref 80.0–100.0)
Monocytes Absolute: 1 10*3/uL (ref 0.1–1.0)
Monocytes Relative: 11 %
Neutro Abs: 6.5 10*3/uL (ref 1.7–7.7)
Neutrophils Relative %: 74 %
Platelets: 173 10*3/uL (ref 150–400)
RBC: 4.36 MIL/uL (ref 3.87–5.11)
RDW: 15.6 % — ABNORMAL HIGH (ref 11.5–15.5)
WBC: 8.8 10*3/uL (ref 4.0–10.5)
nRBC: 0 % (ref 0.0–0.2)

## 2023-04-04 LAB — COMPREHENSIVE METABOLIC PANEL WITH GFR
ALT: 8 U/L (ref 0–44)
AST: 13 U/L — ABNORMAL LOW (ref 15–41)
Albumin: 2.8 g/dL — ABNORMAL LOW (ref 3.5–5.0)
Alkaline Phosphatase: 46 U/L (ref 38–126)
Anion gap: 11 (ref 5–15)
BUN: 21 mg/dL (ref 8–23)
CO2: 36 mmol/L — ABNORMAL HIGH (ref 22–32)
Calcium: 8.6 mg/dL — ABNORMAL LOW (ref 8.9–10.3)
Chloride: 93 mmol/L — ABNORMAL LOW (ref 98–111)
Creatinine, Ser: 0.47 mg/dL (ref 0.44–1.00)
GFR, Estimated: >60 mL/min (ref >60)
Glucose, Bld: 125 mg/dL — ABNORMAL HIGH (ref 70–99)
Potassium: 3 mmol/L — ABNORMAL LOW (ref 3.5–5.1)
Sodium: 140 mmol/L (ref 135–145)
Total Bilirubin: 1 mg/dL (ref 0.0–1.2)
Total Protein: 6.7 g/dL (ref 6.5–8.1)

## 2023-04-04 LAB — GLUCOSE, CAPILLARY
Glucose-Capillary: 125 mg/dL — ABNORMAL HIGH (ref 70–99)
Glucose-Capillary: 140 mg/dL — ABNORMAL HIGH (ref 70–99)
Glucose-Capillary: 218 mg/dL — ABNORMAL HIGH (ref 70–99)

## 2023-04-04 MED ORDER — DONEPEZIL HCL 5 MG PO TABS
10.0000 mg | ORAL_TABLET | Freq: Every day | ORAL | Status: DC
  Administered 2023-04-04 – 2023-04-07 (×4): 10 mg via ORAL
  Filled 2023-04-04 (×5): qty 2

## 2023-04-04 MED ORDER — POTASSIUM CHLORIDE 20 MEQ PO PACK
40.0000 meq | PACK | Freq: Two times a day (BID) | ORAL | Status: AC
  Administered 2023-04-04 (×2): 40 meq via ORAL
  Filled 2023-04-04 (×2): qty 2

## 2023-04-04 MED ORDER — PANTOPRAZOLE SODIUM 40 MG PO TBEC
40.0000 mg | DELAYED_RELEASE_TABLET | Freq: Every day | ORAL | Status: DC
  Administered 2023-04-04 – 2023-04-08 (×5): 40 mg via ORAL
  Filled 2023-04-04 (×5): qty 1

## 2023-04-04 MED ORDER — LOSARTAN POTASSIUM 50 MG PO TABS
50.0000 mg | ORAL_TABLET | Freq: Every day | ORAL | Status: DC
  Administered 2023-04-04 – 2023-04-08 (×5): 50 mg via ORAL
  Filled 2023-04-04 (×5): qty 1

## 2023-04-04 MED ORDER — ATORVASTATIN CALCIUM 20 MG PO TABS
20.0000 mg | ORAL_TABLET | Freq: Every day | ORAL | Status: DC
  Administered 2023-04-04 – 2023-04-08 (×5): 20 mg via ORAL
  Filled 2023-04-04 (×5): qty 1

## 2023-04-04 MED ORDER — APIXABAN 5 MG PO TABS
5.0000 mg | ORAL_TABLET | Freq: Two times a day (BID) | ORAL | Status: DC
  Administered 2023-04-04 – 2023-04-08 (×9): 5 mg via ORAL
  Filled 2023-04-04 (×9): qty 1

## 2023-04-04 MED ORDER — ENSURE ENLIVE PO LIQD
237.0000 mL | Freq: Two times a day (BID) | ORAL | Status: DC
  Administered 2023-04-04 – 2023-04-08 (×6): 237 mL via ORAL

## 2023-04-04 MED ORDER — BUSPIRONE HCL 10 MG PO TABS
15.0000 mg | ORAL_TABLET | Freq: Two times a day (BID) | ORAL | Status: DC
  Administered 2023-04-04 – 2023-04-08 (×9): 15 mg via ORAL
  Filled 2023-04-04 (×9): qty 2

## 2023-04-04 MED ORDER — SERTRALINE HCL 50 MG PO TABS
100.0000 mg | ORAL_TABLET | Freq: Every day | ORAL | Status: DC
  Administered 2023-04-04 – 2023-04-08 (×5): 100 mg via ORAL
  Filled 2023-04-04 (×5): qty 2

## 2023-04-04 NOTE — Evaluation (Signed)
 Physical Therapy Evaluation Patient Details Name: [name] MRN: [id number] DOB: [birth year] Today's Date: 04/04/2023  History of Present Illness  83 y.o. 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  Patient is a resident at [facility] facility.   Clinical Impression  Patient alert, friend at bedside, agreeable to mobility with max encouragement due to neck pain, RN notified. Per pt and friend, she was up until this week able to perform bed mobility and stand pivot transfers to [facility] modI. Assist for lower body dressing. Supine to sit with maxAx2, pt attempted to initiate movement but limited due to pain. She was able to sit EOB for majority of session mod-maxA for sitting balance due to posterior lean. Returned to supine maxAx2 and repositioned for comfort, in chair position.  Overall the patient demonstrated deficits (see "PT Problem List") that impede the patient's functional abilities, safety, and mobility and would benefit from skilled PT intervention.          If plan is discharge home, recommend the following: Two people to help with walking and/or transfers;Two people to help with bathing/dressing/bathroom;Direct supervision/assist for medications management;Help with stairs or ramp for entrance;Assist for transportation;Assistance with feeding;Assistance with cooking/housework   Can travel by private vehicle   No    Equipment Recommendations Other (comment) (TBD at next venue of care)  Recommendations for Other Services       Functional Status Assessment Patient has had a recent decline in their functional status and demonstrates the ability to make significant improvements in function in a reasonable  and predictable amount of time.     Precautions / Restrictions Precautions Precautions: Fall      Mobility  Bed Mobility Overal bed mobility: Needs Assistance Bed Mobility: Supine to Sit, Sit to Supine     Supine to sit: Max assist, +2 for physical assistance Sit to supine: Max assist, +2 for physical assistance        Transfers                   General transfer comment: deferred due to pt pain, BLE weakness    Ambulation/Gait                  Stairs            Wheelchair Mobility     Tilt Bed    Modified Rankin (Stroke Patients Only)       Balance Overall balance assessment: Needs assistance Sitting-balance support: Feet supported Sitting balance-Leahy Scale: Poor Sitting balance - Comments: mod-maxA to maintain sitting, BUE intermittently propped on therapist, bed rail, EOB, handheld assist from therapist                                     Pertinent Vitals/Pain Pain Assessment Pain Assessment: 0-10 Pain Score: 9  Pain Location: neck Pain Descriptors / Indicators: Aching Pain Intervention(s): Limited activity within patient's tolerance, Monitored during session, Patient requesting pain meds-RN notified    Home Living Family/patient expects to be discharged to:: Skilled nursing facility                   Additional Comments: [facility]  Prior Function Prior Level of Function : Needs assist             Mobility Comments: w/c level primarily and able to transfer herself at baseline, mod indep with bed mobility, essentially bed bound for x1/wk ADLs Comments: Assist for LB ADL, is transferred into tub for bath wiht assist from staff, able to transfer on/off toilet herself but needs assist for clothing mgt and pericare, staff assist with IADL     Extremity/Trunk Assessment   Upper Extremity Assessment Upper Extremity Assessment: Defer to OT evaluation    Lower Extremity Assessment Lower Extremity  Assessment: Generalized weakness (unable to lift BLE from bed without assistance)       Communication        Cognition Arousal: Alert Behavior During Therapy: [facility] for tasks assessed/performed                           PT - Cognition Comments: delayed processing, speaks slowly but oriented, able to answer PLOF and friend at bedside assisted with PLOF as needed         Cueing       General Comments      Exercises     Assessment/Plan    PT Assessment Patient needs continued PT services  PT Problem List Decreased strength;Decreased range of motion;Decreased activity tolerance;Decreased mobility;Decreased balance;Decreased knowledge of precautions;Decreased knowledge of use of DME       PT Treatment Interventions DME instruction;Balance training;Neuromuscular re-education;Gait training;Stair training;Functional mobility training;Patient/family education;Therapeutic activities;Therapeutic exercise    PT Goals (Current goals can be found in the Care Plan section)  Acute Rehab PT Goals Patient Stated Goal: to have less pain PT Goal Formulation: With patient Time For Goal Achievement: 04/18/23 Potential to Achieve Goals: Fair    Frequency Min 1X/week     Co-evaluation PT/OT/SLP Co-Evaluation/Treatment: Yes Reason for Co-Treatment: To address functional/ADL transfers;For patient/therapist safety PT goals addressed during session: Mobility/safety with mobility;Balance OT goals addressed during session: ADL's and self-care;Strengthening/ROM       AM-PAC PT "6 Clicks" Mobility  Outcome Measure Help needed turning from your back to your side while in a flat bed without using bedrails?: Total Help needed moving from lying on your back to sitting on the side of a flat bed without using bedrails?: Total Help needed moving to and from a bed to a chair (including a wheelchair)?: Total Help needed standing up from a chair using your arms (e.g., wheelchair or bedside  chair)?: Total Help needed to walk in hospital room?: Total Help needed climbing 3-5 steps with a railing? : Total 6 Click Score: 6    End of Session   Activity Tolerance: Patient limited by fatigue;Patient limited by pain Patient left: in bed;with family/visitor present;with call bell/phone within reach;with bed alarm set (chair position) Nurse Communication: Mobility status PT Visit Diagnosis: Other abnormalities of gait and mobility (R26.89);Difficulty in walking, not elsewhere classified (R26.2);Muscle weakness (generalized) (M62.81)    Time: 8295-6213 PT Time Calculation (min) (ACUTE ONLY): 33 min   Charges:   PT Evaluation $PT Eval Low Complexity: 1 Low PT Treatments $Therapeutic Activity: 8-22 mins PT General Charges $$ ACUTE PT VISIT: 1 Visit        Olga Coaster PT, DPT 4:08 PM,04/04/23

## 2023-04-04 NOTE — ED Notes (Signed)
 ED TO INPATIENT HANDOFF REPORT  ED Nurse Name and Phone #: Cheral Almas [phone]  S Name/Age/Gender [name] 83 y.o. female Room/Bed: ED36A/ED36A  Code Status   Code Status: Limited: Do not attempt resuscitation (DNR) -DNR-LIMITED -Do Not Intubate/DNI   Home/SNF/Other Nursing Home Patient oriented to: self, place, and time Is this baseline? Yes   Triage Complete: Triage complete  Chief Complaint Acute encephalopathy [G93.40]  Triage Note [facility]. C/o weakness x 1 week but states it got worse today. Per facility, pt normally  a and o x 4 at baseline. Pt alert to day only. Wears 2 liters oxygen at baseline.    Allergies Allergies  Allergen Reactions   Ciprofloxacin Hcl Itching    Rash and itching all over stomach and arms   Dopamine Nausea And Vomiting   Sulfa Antibiotics Rash   Sulfacetamide Sodium Rash   Tegaderm Ag Mesh [Silver] Other (See Comments) and Rash    Blisters and takes skin off    Level of Care/Admitting Diagnosis ED Disposition     ED Disposition  Admit   Condition  --   Comment  Hospital Area: [facility] [[id number]]  Level of Care: Progressive [102]  Admit to Progressive based on following criteria: NEUROLOGICAL AND NEUROSURGICAL complex patients with significant risk of instability, who do not meet ICU criteria, yet require close observation or frequent assessment (< / = every 2 - 4 hours) with medical / nursing intervention.  Covid Evaluation: Confirmed COVID Negative  Diagnosis: Acute encephalopathy [562130]  Admitting Physician: Verdene Lennert [[id number]]  Attending Physician: Verdene Lennert [[id number]]  Certification:: I certify this patient will need inpatient services for at least 2 midnights  Expected Medical Readiness: 04/10/2023          B Medical/Surgery History Past Medical History:  Diagnosis Date   AICD (automatic cardioverter/defibrillator) present    PLACED BY   DR  Graciela Husbands 2008   Angina    5-6 YRS AGO   Anxiety    Arthritis    Atrial fibrillation (HCC)    CHF (congestive heart failure) (HCC)    Complication of anesthesia    states at times had a tough time waking up   Diabetes mellitus    Diverticulum of esophagus    large diverticulum in the middle third of the esophagus [facility] EGD '09)   Dysrhythmia    A-FIB   Fibromyalgia    Headache(784.0)    Heartburn    History of stomach ulcers    History of transient ischemic attack (TIA)    LAST ONE IN  2005  IN  [place], [place]   HLD (hyperlipidemia)    Hypertension    Shortness of breath    EASING OFF NOW   Sleep apnea    DOESN'T KNOW SETTINGS   Stroke (cerebrum) (HCC)    TIA (transient ischemic attack)    Past Surgical History:  Procedure Laterality Date   ANTERIOR CERVICAL DECOMP/DISCECTOMY FUSION  04/09/2011   Procedure: ANTERIOR CERVICAL DECOMPRESSION/DISCECTOMY FUSION 1 LEVEL;  Surgeon: Temple Pacini, MD;  Location: [facility] NEURO ORS;  Service: Neurosurgery;  Laterality: N/A;  Cervical five-six Anterior Cervical Decompression and Fusion   APPENDECTOMY     BILATERAL OOPHORECTOMY     BENIGN   CARDIAC CATHETERIZATION     X 2    LAST ONE  2009   CESAREAN SECTION     X  4   CHOLECYSTECTOMY     ESOPHAGEAL DILATION  02/03/2023   Procedure: ESOPHAGEAL DILATION;  Surgeon: Toledo, Boykin Nearing, MD;  Location: [facility] ENDOSCOPY;  Service: Gastroenterology;;   ESOPHAGOGASTRODUODENOSCOPY (EGD) WITH PROPOFOL N/A 02/03/2023   Procedure: ESOPHAGOGASTRODUODENOSCOPY (EGD) WITH PROPOFOL;  Surgeon: Toledo, Boykin Nearing, MD;  Location: [facility] ENDOSCOPY;  Service: Gastroenterology;  Laterality: N/A;   IMPLANTABLE CARDIOVERTER DEFIBRILLATOR (ICD) GENERATOR CHANGE Left 01/18/2019   Procedure: ICD GENERATOR CHANGE;  Surgeon: Marcina Millard, MD;  Location: [facility] ORS;  Service: Cardiovascular;  Laterality: Left;   INSERT / REPLACE / REMOVE PACEMAKER     ST  JUDE  DEVICE   JOINT REPLACEMENT     BIL  KNEES   TONSILLECTOMY      TUBAL LIGATION       A IV Location/Drains/Wounds Patient Lines/Drains/Airways Status     Active Line/Drains/Airways     Name Placement date Placement time Site Days   Peripheral IV 04/03/23 22 G Posterior;Right Forearm 04/03/23  1303  Forearm  1   Peripheral IV 04/03/23 20 G Left Antecubital 04/03/23  1304  Antecubital  1            Intake/Output Last 24 hours  Intake/Output Summary (Last 24 hours) at 04/04/2023 0424 Last data filed at 04/03/2023 1539 Gross per 24 hour  Intake 1099 ml  Output --  Net 1099 ml    Labs/Imaging Results for orders placed or performed during the hospital encounter of 04/03/23 (from the past 48 hours)  Lactic acid, plasma     Status: None   Collection Time: 04/03/23 12:59 PM  Result Value Ref Range   Lactic Acid, Venous 1.1 0.5 - 1.9 mmol/L    Comment: Performed at [facility], [address] [place], [place] [address]  Comprehensive metabolic panel     Status: Abnormal   Collection Time: 04/03/23 12:59 PM  Result Value Ref Range   Sodium 138 135 - 145 mmol/L   Potassium 3.4 (L) 3.5 - 5.1 mmol/L   Chloride 88 (L) 98 - 111 mmol/L   CO2 37 (H) 22 - 32 mmol/L   Glucose, Bld 153 (H) 70 - 99 mg/dL    Comment: Glucose reference range applies only to samples taken after fasting for at least 8 hours.   BUN 28 (H) 8 - 23 mg/dL   Creatinine, Ser 6.04 0.44 - 1.00 mg/dL   Calcium 9.0 8.9 - 54.0 mg/dL   Total Protein 7.4 6.5 - 8.1 g/dL   Albumin 3.2 (L) 3.5 - 5.0 g/dL   AST 20 15 - 41 U/L   ALT 8 0 - 44 U/L   Alkaline Phosphatase 49 38 - 126 U/L   Total Bilirubin 1.1 0.0 - 1.2 mg/dL   GFR, Estimated >98 >11 mL/min    Comment: (NOTE) Calculated using the CKD-EPI Creatinine Equation (2021)    Anion gap 13 5 - 15    Comment: Performed at [facility], [address] [place], [place] [address]  Protime-INR     Status: Abnormal   Collection Time: 04/03/23 12:59 PM  Result Value Ref Range   Prothrombin Time 15.7 (H) 11.4 -  15.2 seconds   INR 1.2 0.8 - 1.2    Comment: (NOTE) INR goal varies based on device and disease states. Performed at [facility], [address] [place], [place] [address]   APTT     Status: Abnormal   Collection Time: 04/03/23 12:59 PM  Result Value Ref Range   aPTT 37 (H) 24 - 36 seconds    Comment:  IF BASELINE aPTT IS ELEVATED, SUGGEST PATIENT RISK ASSESSMENT BE USED TO DETERMINE APPROPRIATE ANTICOAGULANT THERAPY. Performed at [facility], [address] [place], [place] [address]   Troponin I (High Sensitivity)     Status: Abnormal   Collection Time: 04/03/23 12:59 PM  Result Value Ref Range   Troponin I (High Sensitivity) 19 (H) <18 ng/L    Comment: (NOTE) Elevated high sensitivity troponin I (hsTnI) values and significant  changes across serial measurements may suggest ACS but many other  chronic and acute conditions are known to elevate hsTnI results.  Refer to the "Links" section for chest pain algorithms and additional  guidance. Performed at [facility], [address] [place], [place] [address]   Magnesium     Status: None   Collection Time: 04/03/23 12:59 PM  Result Value Ref Range   Magnesium 2.1 1.7 - 2.4 mg/dL    Comment: Performed at [facility], [address] [place], [place] [address]  Group A Strep by PCR [facility] Only)     Status: None   Collection Time: 04/03/23 12:59 PM   Specimen: Throat; Sterile Swab  Result Value Ref Range   Group A Strep by PCR NOT DETECTED NOT DETECTED    Comment: Performed at [facility], [address], [place], [place] [address]  Resp panel by RT-PCR (RSV, Flu A&B, Covid) Anterior Nasal Swab     Status: None   Collection Time: 04/03/23 12:59 PM   Specimen: Anterior Nasal Swab  Result Value Ref Range   SARS Coronavirus 2 by RT PCR NEGATIVE NEGATIVE    Comment: (NOTE) SARS-CoV-2 target nucleic acids are NOT DETECTED.  The SARS-CoV-2 RNA is generally detectable  in upper respiratory specimens during the acute phase of infection. The lowest concentration of SARS-CoV-2 viral copies this assay can detect is 138 copies/mL. A negative result does not preclude SARS-Cov-2 infection and should not be used as the sole basis for treatment or other patient management decisions. A negative result may occur with  improper specimen collection/handling, submission of specimen other than nasopharyngeal swab, presence of viral mutation(s) within the areas targeted by this assay, and inadequate number of viral copies(<138 copies/mL). A negative result must be combined with clinical observations, patient history, and epidemiological information. The expected result is Negative.  Fact Sheet for Patients:  BloggerCourse.com  Fact Sheet for Healthcare Providers:  SeriousBroker.it  This test is no t yet approved or cleared by the [place] FDA and  has been authorized for detection and/or diagnosis of SARS-CoV-2 by FDA under an Emergency Use Authorization (EUA). This EUA will remain  in effect (meaning this test can be used) for the duration of the COVID-19 declaration under Section 564(b)(1) of the Act, 21 U.S.C.section 360bbb-3(b)(1), unless the authorization is terminated  or revoked sooner.       Influenza A by PCR NEGATIVE NEGATIVE   Influenza B by PCR NEGATIVE NEGATIVE    Comment: (NOTE) The Xpert Xpress SARS-CoV-2/FLU/RSV plus assay is intended as an aid in the diagnosis of influenza from Nasopharyngeal swab specimens and should not be used as a sole basis for treatment. Nasal washings and aspirates are unacceptable for Xpert Xpress SARS-CoV-2/FLU/RSV testing.  Fact Sheet for Patients: BloggerCourse.com  Fact Sheet for Healthcare Providers: SeriousBroker.it  This test is not yet approved or cleared by the [place] FDA and has been  authorized for detection and/or diagnosis of SARS-CoV-2 by FDA under an Emergency Use Authorization (EUA). This EUA will remain  in effect (meaning this test can be used) for the duration of the COVID-19 declaration under Section 564(b)(1) of the Act, 21 U.S.C. section 360bbb-3(b)(1), unless the authorization is terminated or revoked.     Resp Syncytial Virus by PCR NEGATIVE NEGATIVE    Comment: (NOTE) Fact Sheet for Patients: BloggerCourse.com  Fact Sheet for Healthcare Providers: SeriousBroker.it  This test is not yet approved or cleared by the [place] FDA and has been authorized for detection and/or diagnosis of SARS-CoV-2 by FDA under an Emergency Use Authorization (EUA). This EUA will remain in effect (meaning this test can be used) for the duration of the COVID-19 declaration under Section 564(b)(1) of the Act, 21 U.S.C. section 360bbb-3(b)(1), unless the authorization is terminated or revoked.  Performed at [facility], [address] [place], [place] [address]   Brain natriuretic peptide     Status: Abnormal   Collection Time: 04/03/23 12:59 PM  Result Value Ref Range   B Natriuretic Peptide 105.7 (H) 0.0 - 100.0 pg/mL    Comment: Performed at [facility], [address] [place], [place] [address]  Procalcitonin     Status: None   Collection Time: 04/03/23 12:59 PM  Result Value Ref Range   Procalcitonin <0.10 ng/mL    Comment:        Interpretation: PCT (Procalcitonin) <= 0.5 ng/mL: Systemic infection (sepsis) is not likely. Local bacterial infection is possible. (NOTE)       Sepsis PCT Algorithm           Lower Respiratory Tract                                      Infection PCT Algorithm    ----------------------------     ----------------------------         PCT < 0.25 ng/mL                PCT < 0.10 ng/mL          Strongly encourage             Strongly discourage    discontinuation of antibiotics    initiation of antibiotics    ----------------------------     -----------------------------       PCT 0.25 - 0.50 ng/mL            PCT 0.10 - 0.25 ng/mL               OR       >80% decrease in PCT            Discourage initiation of                                            antibiotics      Encourage discontinuation           of antibiotics    ----------------------------     -----------------------------         PCT >= 0.50 ng/mL              PCT 0.26 - 0.50 ng/mL               AND        <80% decrease in PCT             Encourage  initiation of                                             antibiotics       Encourage continuation           of antibiotics    ----------------------------     -----------------------------        PCT >= 0.50 ng/mL                  PCT > 0.50 ng/mL               AND         increase in PCT                  Strongly encourage                                      initiation of antibiotics    Strongly encourage escalation           of antibiotics                                     -----------------------------                                           PCT <= 0.25 ng/mL                                                 OR                                        > 80% decrease in PCT                                      Discontinue / Do not initiate                                             antibiotics  Performed at [facility], [address] [place], [place] [address]   Urinalysis, w/ Reflex to Culture (Infection Suspected) -Urine, Clean Catch     Status: Abnormal   Collection Time: 04/03/23  1:01 PM  Result Value Ref Range   Specimen Source URINE, CLEAN CATCH    Color, Urine YELLOW (A) YELLOW   APPearance CLOUDY (A) CLEAR   Specific Gravity, Urine 1.020 1.005 - 1.030   pH 7.0 5.0 - 8.0   Glucose, UA NEGATIVE NEGATIVE mg/dL   Hgb urine dipstick LARGE (A) NEGATIVE   Bilirubin Urine NEGATIVE NEGATIVE    Ketones, ur NEGATIVE NEGATIVE mg/dL   Protein, ur 578 (A) NEGATIVE mg/dL   Nitrite NEGATIVE NEGATIVE   Leukocytes,Ua  MODERATE (A) NEGATIVE   RBC / HPF >50 0 - 5 RBC/hpf   WBC, UA >50 0 - 5 WBC/hpf    Comment:        Reflex urine culture not performed if WBC <=10, OR if Squamous epithelial cells >5. If Squamous epithelial cells >5 suggest recollection.    Bacteria, UA MANY (A) NONE SEEN   Squamous Epithelial / HPF 0-5 0 - 5 /HPF   Mucus PRESENT    Hyaline Casts, UA PRESENT     Comment: Performed at [facility], [address] [place], [place] [address]  Blood gas, venous     Status: Abnormal   Collection Time: 04/03/23  2:08 PM  Result Value Ref Range   pH, Ven 7.44 (H) 7.25 - 7.43   pCO2, Ven 67 (H) 44 - 60 mmHg   pO2, Ven 44 32 - 45 mmHg   Bicarbonate 45.5 (H) 20.0 - 28.0 mmol/L   Acid-Base Excess 17.6 (H) 0.0 - 2.0 mmol/L   O2 Saturation 76.6 %   Patient temperature 37.0    Collection site VEIN     Comment: Performed at [facility], [address], [place], [place] [address]  Troponin I (High Sensitivity)     Status: Abnormal   Collection Time: 04/03/23  2:55 PM  Result Value Ref Range   Troponin I (High Sensitivity) 21 (H) <18 ng/L    Comment: (NOTE) Elevated high sensitivity troponin I (hsTnI) values and significant  changes across serial measurements may suggest ACS but many other  chronic and acute conditions are known to elevate hsTnI results.  Refer to the "Links" section for chest pain algorithms and additional  guidance. Performed at [facility], [address] [place], [place] [address]   TSH     Status: None   Collection Time: 04/03/23  2:55 PM  Result Value Ref Range   TSH 1.899 0.350 - 4.500 uIU/mL    Comment: Performed by a 3rd Generation assay with a functional sensitivity of <=0.01 uIU/mL. Performed at [facility], [address] [place], [place] [address]   CBC with Differential/Platelet     Status:  Abnormal   Collection Time: 04/03/23  3:41 PM  Result Value Ref Range   WBC 11.1 (H) 4.0 - 10.5 K/uL   RBC 4.42 3.87 - 5.11 MIL/uL   Hemoglobin 11.9 (L) 12.0 - 15.0 g/dL   HCT 65.7 84.6 - 96.2 %   MCV 87.6 80.0 - 100.0 fL   MCH 26.9 26.0 - 34.0 pg   MCHC 30.7 30.0 - 36.0 g/dL   RDW 95.2 (H) 84.1 - 32.4 %   Platelets 180 150 - 400 K/uL   nRBC 0.0 0.0 - 0.2 %   Neutrophils Relative % 79 %   Neutro Abs 8.8 (H) 1.7 - 7.7 K/uL   Lymphocytes Relative 9 %   Lymphs Abs 0.9 0.7 - 4.0 K/uL   Monocytes Relative 11 %   Monocytes Absolute 1.2 (H) 0.1 - 1.0 K/uL   Eosinophils Relative 0 %   Eosinophils Absolute 0.0 0.0 - 0.5 K/uL   Basophils Relative 0 %   Basophils Absolute 0.0 0.0 - 0.1 K/uL   Immature Granulocytes 1 %   Abs Immature Granulocytes 0.07 0.00 - 0.07 K/uL    Comment: Performed at [facility], [address] [place], [place] [address]  Hemoglobin A1c     Status: Abnormal   Collection Time: 04/03/23  3:41 PM  Result Value Ref  Range   Hgb A1c MFr Bld 7.1 (H) 4.8 - 5.6 %    Comment: (NOTE) Pre diabetes:          5.7%-6.4%  Diabetes:              >6.4%  Glycemic control for   <7.0% adults with diabetes    Mean Plasma Glucose 157.07 mg/dL    Comment: Performed at [facility], [address], [place], [place] [address]  Respiratory (~20 pathogens) panel by PCR     Status: None   Collection Time: 04/03/23  5:04 PM   Specimen: Nasopharyngeal Swab; Respiratory  Result Value Ref Range   Adenovirus NOT DETECTED NOT DETECTED   Coronavirus 229E NOT DETECTED NOT DETECTED    Comment: (NOTE) The Coronavirus on the Respiratory Panel, DOES NOT test for the novel  Coronavirus (2019 nCoV)    Coronavirus HKU1 NOT DETECTED NOT DETECTED   Coronavirus NL63 NOT DETECTED NOT DETECTED   Coronavirus OC43 NOT DETECTED NOT DETECTED   Metapneumovirus NOT DETECTED NOT DETECTED   Rhinovirus / Enterovirus NOT DETECTED NOT DETECTED   Influenza A NOT DETECTED NOT DETECTED    Influenza B NOT DETECTED NOT DETECTED   Parainfluenza Virus 1 NOT DETECTED NOT DETECTED   Parainfluenza Virus 2 NOT DETECTED NOT DETECTED   Parainfluenza Virus 3 NOT DETECTED NOT DETECTED   Parainfluenza Virus 4 NOT DETECTED NOT DETECTED   Respiratory Syncytial Virus NOT DETECTED NOT DETECTED   Bordetella pertussis NOT DETECTED NOT DETECTED   Bordetella Parapertussis NOT DETECTED NOT DETECTED   Chlamydophila pneumoniae NOT DETECTED NOT DETECTED   Mycoplasma pneumoniae NOT DETECTED NOT DETECTED    Comment: Performed at [facility], [address], [place], [place] [address]  Comprehensive metabolic panel     Status: Abnormal   Collection Time: 04/04/23  2:48 AM  Result Value Ref Range   Sodium 140 135 - 145 mmol/L   Potassium 3.0 (L) 3.5 - 5.1 mmol/L   Chloride 93 (L) 98 - 111 mmol/L   CO2 36 (H) 22 - 32 mmol/L   Glucose, Bld 125 (H) 70 - 99 mg/dL    Comment: Glucose reference range applies only to samples taken after fasting for at least 8 hours.   BUN 21 8 - 23 mg/dL   Creatinine, Ser 1.30 0.44 - 1.00 mg/dL   Calcium 8.6 (L) 8.9 - 10.3 mg/dL   Total Protein 6.7 6.5 - 8.1 g/dL   Albumin 2.8 (L) 3.5 - 5.0 g/dL   AST 13 (L) 15 - 41 U/L   ALT 8 0 - 44 U/L   Alkaline Phosphatase 46 38 - 126 U/L   Total Bilirubin 1.0 0.0 - 1.2 mg/dL   GFR, Estimated >86 >57 mL/min    Comment: (NOTE) Calculated using the CKD-EPI Creatinine Equation (2021)    Anion gap 11 5 - 15    Comment: Performed at [facility], [address] [place], [place] [address]  CBC with Differential/Platelet     Status: Abnormal   Collection Time: 04/04/23  2:48 AM  Result Value Ref Range   WBC 8.8 4.0 - 10.5 K/uL   RBC 4.36 3.87 - 5.11 MIL/uL   Hemoglobin 11.7 (L) 12.0 - 15.0 g/dL   HCT 29.5 28.4 - 13.2 %   MCV 88.5 80.0 - 100.0 fL   MCH 26.8 26.0 - 34.0 pg   MCHC 30.3 30.0 - 36.0 g/dL   RDW 44.0 (H) 10.2 - 72.5 %  Platelets 173 150 - 400 K/uL   nRBC 0.0 0.0 - 0.2 %   Neutrophils Relative %  74 %   Neutro Abs 6.5 1.7 - 7.7 K/uL   Lymphocytes Relative 14 %   Lymphs Abs 1.2 0.7 - 4.0 K/uL   Monocytes Relative 11 %   Monocytes Absolute 1.0 0.1 - 1.0 K/uL   Eosinophils Relative 0 %   Eosinophils Absolute 0.0 0.0 - 0.5 K/uL   Basophils Relative 0 %   Basophils Absolute 0.0 0.0 - 0.1 K/uL   Immature Granulocytes 1 %   Abs Immature Granulocytes 0.05 0.00 - 0.07 K/uL    Comment: Performed at [facility], [address], [place], [place] [address]   CT Renal Stone Study Result Date: 04/03/2023 CLINICAL DATA:  Altered mental status. EXAM: CT ABDOMEN AND PELVIS WITHOUT CONTRAST TECHNIQUE: Multidetector CT imaging of the abdomen and pelvis was performed following the standard protocol without IV contrast. RADIATION DOSE REDUCTION: This exam was performed according to the departmental dose-optimization program which includes automated exposure control, adjustment of the mA and/or kV according to patient size and/or use of iterative reconstruction technique. COMPARISON:  CT abdomen pelvis dated July 11, 2020. FINDINGS: Lower chest: No acute abnormality. Chronic cardiomegaly. Subsegmental atelectasis at both lung bases. Hepatobiliary: No focal liver abnormality is seen, other than unchanged punctate calcified granulomas. Status post cholecystectomy. No biliary dilatation. Pancreas: Unremarkable. No pancreatic ductal dilatation or surrounding inflammatory changes. Spleen: Normal in size without focal abnormality. Punctate calcified granulomas again noted. Adrenals/Urinary Tract: The adrenal glands are unremarkable. Multiple left renal simple cysts again noted. No follow-up imaging is recommended. Unchanged punctate calculus in the midpole of the right kidney. No ureteral calculi or hydronephrosis. The bladder is unremarkable. Stomach/Bowel: Stomach is within normal limits. Appendix appears normal. No evidence of bowel wall thickening, distention, or inflammatory changes. Left-sided colonic  diverticulosis. Vascular/Lymphatic: Aortic atherosclerosis. No enlarged abdominal or pelvic lymph nodes. Reproductive: Uterus and bilateral adnexa are unremarkable. Other: No free fluid or pneumoperitoneum. Musculoskeletal: No acute or significant osseous findings. IMPRESSION: 1. No acute intra-abdominal process. 2. Unchanged punctate nonobstructive right nephrolithiasis. 3.  Aortic Atherosclerosis (ICD10-I70.0). Electronically Signed   By: Obie Dredge M.D.   On: 04/03/2023 16:50   CT Soft Tissue Neck W Contrast Result Date: 04/03/2023 CLINICAL DATA:  Sore throat.  Altered mental status. EXAM: CT NECK WITH CONTRAST TECHNIQUE: Multidetector CT imaging of the neck was performed using the standard protocol following the bolus administration of intravenous contrast. RADIATION DOSE REDUCTION: This exam was performed according to the departmental dose-optimization program which includes automated exposure control, adjustment of the mA and/or kV according to patient size and/or use of iterative reconstruction technique. CONTRAST:  75mL OMNIPAQUE IOHEXOL 300 MG/ML  SOLN COMPARISON:  None Available. FINDINGS: Pharynx and larynx: No focal mucosal or submucosal lesions are present. Nasopharynx is clear. Soft palate and tongue base are within normal limits. Posterior oropharynx is unremarkable. Palatine tonsils are within normal limits. Vallecula and epiglottis are within normal limits. Aryepiglottic folds and piriform sinuses are clear. Vocal cords are midline and symmetric. Trachea is clear. Salivary glands: The submandibular and parotid glands and ducts are within normal limits. Thyroid: Normal Lymph nodes: No significant adenopathy is present. Vascular: Atherosclerotic calcifications are present the carotid bifurcations bilaterally. No focal stenosis is present. Limited intracranial: Within normal limits. Visualized orbits: Bilateral lens replacements are noted. Globes and orbits are otherwise unremarkable. Mastoids  and visualized paranasal sinuses: Mild mucosal thickening is present the left maxillary  sinus. Skeleton: Vertebral body heights and alignment are normal. Solid fusion present at C5-6. No focal osseous lesions are present. Upper chest: Dependent atelectasis is present bilaterally. The lungs are otherwise clear. No significant pleural effusion or pneumothorax is present. Atherosclerotic changes are present at the aortic arch. IMPRESSION: 1. No acute or focal lesion to explain the patient's symptoms. 2. Solid fusion at C5-6. 3. Mild left maxillary sinus disease. 4.  Aortic Atherosclerosis (ICD10-I70.0). Electronically Signed   By: Marin Roberts M.D.   On: 04/03/2023 16:48   CT HEAD WO CONTRAST ( ) Result Date: 04/03/2023 CLINICAL DATA:  Headache, new onset. Weakness for 1 week with progression today. EXAM: CT HEAD WITHOUT CONTRAST TECHNIQUE: Contiguous axial images were obtained from the base of the skull through the vertex without intravenous contrast. RADIATION DOSE REDUCTION: This exam was performed according to the departmental dose-optimization program which includes automated exposure control, adjustment of the mA and/or kV according to patient size and/or use of iterative reconstruction technique. COMPARISON:  CT head without contrast 07/11/2020. FINDINGS: Brain: No acute infarct, hemorrhage, or mass lesion is present. Moderate diffuse white matter changes demonstrates some progression. Basal ganglia are intact. Insular ribbon is normal. The brainstem and cerebellum are within normal limits. 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 Vascular: Atherosclerotic calcifications are present within the cavernous internal carotid arteries and at the dural margin of both vertebral arteries. No hyperdense vessel is present. Skull: Calvarium is intact. No focal lytic or blastic lesions are present. No significant extracranial soft tissue lesion is present. Sinuses/Orbits: Bilateral lens replacements are noted.  Globes and orbits are otherwise unremarkable. The globes and orbits are within normal limits. IMPRESSION: 1. No acute intracranial abnormality or significant interval change. 2. Moderate diffuse white matter changes demonstrate some progression. This likely reflects the sequela of chronic microvascular ischemia. 3. Enlarged empty sella. This is nonspecific, but can be seen in the setting of idiopathic intracranial hypertension. Electronically Signed   By: Marin Roberts M.D.   On: 04/03/2023 14:53   DG Chest Port 1 View Result Date: 04/03/2023 CLINICAL DATA:  Questionable sepsis.  Weakness for 1 week. EXAM: PORTABLE CHEST 1 VIEW COMPARISON:  Radiographs 11/02/2020 and 07/11/2020.  CT 10/25/2019. FINDINGS: 1306 hours. Mild patient rotation to the left. Grossly stable cardiomegaly, aortic atherosclerosis and central enlargement of the pulmonary arteries. Left subclavian AICD leads appear unchanged. There is vascular congestion without overt pulmonary edema. No confluent airspace disease, pneumothorax or significant pleural effusion. No acute osseous findings are seen. IMPRESSION: Cardiomegaly with vascular congestion. No overt pulmonary edema or focal airspace disease. Electronically Signed   By: Carey Bullocks M.D.   On: 04/03/2023 14:24    Pending Labs Unresulted Labs (From admission, onward)     Start     Ordered   04/03/23 1301  Urine Culture  Once,   R        04/03/23 1301   04/03/23 1255  Lactic acid, plasma  (Undifferentiated presentation (screening labs and basic nursing orders))  Now then every 2 hours,   STAT      04/03/23 1255   04/03/23 1255  CBC with Differential  (Undifferentiated presentation (screening labs and basic nursing orders))  ONCE - STAT,   STAT        04/03/23 1255   04/03/23 1255  Blood Culture (routine x 2)  (Undifferentiated presentation (screening labs and basic nursing orders))  BLOOD CULTURE X 2,   STAT      04/03/23 1255  Vitals/Pain Today's  Vitals   04/04/23 0100 04/04/23 0200 04/04/23 0217 04/04/23 0400  BP: (!) 116/48   (!) 126/45  Pulse: 69   69  Resp: (!) 24   17  Temp:   98 F (36.7 C)   TempSrc:   Oral   SpO2: 97%   96%  Weight:      Height:      PainSc: 0-No pain Asleep      Isolation Precautions Droplet precaution  Medications Medications  lactated ringers infusion ( Intravenous New Bag/Given 04/04/23 0229)  sodium chloride flush (NS) 0.9 % injection 3 mL (3 mLs Intravenous Not Given 04/03/23 2125)  acetaminophen (TYLENOL) tablet 650 mg (has no administration in time range)    Or  acetaminophen (TYLENOL) suppository 650 mg (has no administration in time range)  ondansetron (ZOFRAN) injection 4 mg (has no administration in time range)  cefTRIAXone (ROCEPHIN) 2 g in sodium chloride 0.9 % 100 mL IVPB (has no administration in time range)  albuterol (PROVENTIL) (2.5 MG/3ML) 0.083% nebulizer solution 2.5 mg (has no administration in time range)  sodium chloride 0.9 % bolus 1,000 mL (0 mLs Intravenous Stopped 04/03/23 1454)  cefTRIAXone (ROCEPHIN) 2 g in sodium chloride 0.9 % 100 mL IVPB (0 g Intravenous Stopped 04/03/23 1539)  iohexol (OMNIPAQUE) 300 MG/ML solution 75 mL (75 mLs Intravenous Contrast Given 04/03/23 1618)    Mobility non-ambulatory     Focused Assessments Neuro Assessment Handoff:  Swallow screen pass? Yes  Cardiac Rhythm: Normal sinus rhythm       Neuro Assessment: Exceptions to WDL Neuro Checks:      Has TPA been given? No If patient is a Neuro Trauma and patient is going to OR before floor call report to 4N Charge nurse: [phone] or [phone]   R Recommendations: See Admitting Provider Note  Report given to:   Additional Notes:

## 2023-04-04 NOTE — Progress Notes (Signed)
 Progress Note   Patient: [name]:[id number] DOB: 09-Mar-1940 DOA: 04/03/2023     1 DOS: the patient was seen and examined on 04/04/2023   Brief hospital course:  [name] is a 83 y.o. 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  Patient is a resident at [facility] facility  ED course:  Urinalysis with large hematuria, moderate leukocytes, many bacteria.  CT head obtained with no acute abnormality.  Chest x-ray obtained with vascular congestion but no overt pulmonary edema.  CT soft tissue of the neck and CT renal stone study ordered and pending.  Patient started on Rocephin and IV fluids.  TRH contacted for admission.    Assessment and Plan: Acute metabolic encephalopathy Patient was sent to the ED from facility due to increasing generalized weakness for 1 week in addition to altered mental status.   Likely secondary to dehydration and UTI Respiratory panel is negative Continue ceftriaxone Follow-up on culture results Patient received IV fluid   Urinary tract infection UA with hematuria and pyuria with AMS, concerning for UTI. Patient is unable to contribute any history.  No evidence of sepsis at this time.  CT stone negative for nephrolithiasis. Continue management as above   Neck pain-improved Patient is presenting with significant neck pain on presentation with unknown duration.  CT imaging is negative. Mental status improved Continue as needed pain management   Chronic respiratory failure with hypoxia and hypercapnia (HCC) Patient has a history of chronic respiratory failure on 2 L, with oxygen levels within goal.   Continue CPAP at night    Chronic HFmrEF w/ recovered EF History of  heart failure with moderately reduced EF, with recovered EF.  Last echo in May 2024 demonstrating LVEF of 50-55%.  She appears hypovolemic at this time and BNP is less than previous measurements. Continue Daily weights Home medications resumed   Depression - Home medications resumed   Hypertension associated with diabetes (HCC) - Home antihypertensives resumed   Type 2 diabetes mellitus (HCC) Diet controlled type 2 diabetes, with glucose elevated at 153. Monitor glucose level Continue insulin therapy   Chronic atrial fibrillation (HCC) Chronic atrial fibrillation, now with ventricular pacing. Continue Eliquis   Advance Care Planning:   Code Status: Limited: Do not attempt resuscitation (DNR) -DNR-LIMITED -Do Not Intubate/DNI Gold form signed at bedside.  Patient's friend confirms that these have always been her wishes.   Consults: None     Subjective:  Patient seen and examined at bedside this morning Denies nausea vomiting abdominal pain chest pain cough Mental status have improved some and still n.p.o. order changed to have healthy  Physical Exam:  General: Obese lady laying in bed in no acute distress HEENT: Eyes:     Extraocular Movements: Extraocular movements intact.     Pupils: Pupils are equal, round, and reactive to light.  Neck: Did have some tenderness on the right shoulder area Cardiovascular:     Rate and Rhythm: Normal rate and regular rhythm.     Heart sounds: No murmur heard. Pulmonary:     Effort: Pulmonary effort is normal. No tachypnea.  Abdominal:     General: Bowel sounds are normal. There is no distension.     Palpations: Abdomen is soft.  Tenderness: There is no abdominal tenderness. There is no guarding.  Musculoskeletal:     Right lower leg: No edema.     Left lower leg: No edema.  Skin:    General: Skin is warm and dry.  Neurological: Mental status improved   Vitals:   04/04/23 0217 04/04/23 0400 04/04/23 0511 04/04/23 0759  BP:  (!)  126/45 (!) 141/63 130/63  Pulse:  69 70 70  Resp:  17 19   Temp: 98 F (36.7 C)  97.8 F (36.6 C) 98.6 F (37 C)  TempSrc: Oral  Oral Oral  SpO2:  96% 97% 96%  Weight:   108.7 kg   Height:        Data Reviewed:  I have reviewed CT scan of the brain that did not show any acute pathology    Latest Ref Rng & Units 04/04/2023    2:48 AM 04/03/2023    3:41 PM 11/02/2020    7:12 AM  CBC  WBC 4.0 - 10.5 K/uL 8.8  11.1  9.8   Hemoglobin 12.0 - 15.0 g/dL 82.9  56.2  9.3   Hematocrit 36.0 - 46.0 % 38.6  38.7  30.9   Platelets 150 - 400 K/uL 173  180  233        Latest Ref Rng & Units 04/04/2023    2:48 AM 04/03/2023   12:59 PM 11/02/2020    7:12 AM  BMP  Glucose 70 - 99 mg/dL 130  865  784   BUN 8 - 23 mg/dL 21  28  8    Creatinine 0.44 - 1.00 mg/dL 6.96  2.95  2.84   Sodium 135 - 145 mmol/L 140  138  142   Potassium 3.5 - 5.1 mmol/L 3.0  3.4  3.3   Chloride 98 - 111 mmol/L 93  88  97   CO2 22 - 32 mmol/L 36  37  36   Calcium 8.9 - 10.3 mg/dL 8.6  9.0  9.0       Time spent: 55 minutes  Author: Loyce Dys, MD 04/04/2023 12:54 PM  For on call review [email or web address].

## 2023-04-04 NOTE — Evaluation (Signed)
 Occupational Therapy Evaluation Patient Details Name: [name] MRN: [id number] DOB: August 17, 1940 Today's Date: 04/04/2023   History of Present Illness   83 y.o. 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  Patient is a resident at [facility] facility.     Clinical Impressions Pt seen for OT evaluation this date with PT to optimize mobility efforts. Patient alert, friend at bedside, agreeable to mobility with max encouragement due to neck pain, RN notified. Per pt and friend, she was up until this week able to perform bed mobility and stand pivot transfers to [facility] modI. Assist for lower body dressing, seated bathing, and pericare/clothing mgt after toileting at baseline. Supine to sit with maxAx2, pt attempted to initiate movement but limited due to pain. She was able to sit EOB for majority of session mod-maxA for sitting balance due to posterior lean. Returned to supine maxAx2 and repositioned for comfort, in chair position. Pt will benefit from additional skilled OT services to maximize return to PLOF.    If plan is discharge home, recommend the following:   Two people to help with walking and/or transfers;A lot of help with bathing/dressing/bathroom;Direct supervision/assist for medications management;Assist for transportation;Assistance with cooking/housework;Help with stairs or ramp for entrance     Functional Status Assessment   Patient has had a recent decline in their functional status and demonstrates the ability to make significant improvements in function in a reasonable and predictable amount of time.     Equipment Recommendations   Other (comment) (defer)     Recommendations for Other  Services         Precautions/Restrictions   Precautions Precautions: Fall     Mobility Bed Mobility Overal bed mobility: Needs Assistance Bed Mobility: Supine to Sit, Sit to Supine     Supine to sit: Max assist, +2 for physical assistance Sit to supine: Max assist, +2 for physical assistance        Transfers                   General transfer comment: deferred due to pt pain, BLE weakness      Balance Overall balance assessment: Needs assistance Sitting-balance support: Feet supported Sitting balance-Leahy Scale: Poor Sitting balance - Comments: mod-maxA to maintain sitting, BUE intermittently propped on therapist, bed rail, EOB, handheld assist from therapist                                   ADL either performed or assessed with clinical judgement   ADL                                         General ADL Comments: Pt requires MAX A For bed level LB ADL tasks, MIN-MOD A for seated UB ADL tasks with support for sitting balance.     Vision         Perception         Praxis         Pertinent Vitals/Pain Pain Assessment Pain Assessment: 0-10 Pain Score: 9  Pain Location: neck Pain Descriptors / Indicators:  Aching Pain Intervention(s): Limited activity within patient's tolerance, Monitored during session, Patient requesting pain meds-RN notified, Repositioned     Extremity/Trunk Assessment Upper Extremity Assessment Upper Extremity Assessment: Generalized weakness   Lower Extremity Assessment Lower Extremity Assessment: Generalized weakness   Cervical / Trunk Assessment Cervical / Trunk Assessment: Other exceptions Cervical / Trunk Exceptions: Pt noting pain and stiffness with neck, limited AROM and pain limited for formal assessment   Communication     Cognition Arousal: Alert Behavior During Therapy: WFL for tasks assessed/performed Cognition: Difficult to assess             OT - Cognition  Comments: VC, slightly delayed processing? continue to assess, fearful of falling                 Following commands: Impaired Following commands impaired: Follows one step commands with increased time     Cueing  General Comments          Exercises Other Exercises Other Exercises: dynamic reaching while seated EOB requiring increased time to complete, fearful of falling, support for sitting balance throughout   Shoulder Instructions      Home Living Family/patient expects to be discharged to:: Skilled nursing facility                                 Additional Comments: [facility]      Prior Functioning/Environment Prior Level of Function : Needs assist             Mobility Comments: w/c level primarily and able to transfer herself at baseline, mod indep with bed mobility, essentially bed bound for x1/wk ADLs Comments: Assist for LB ADL, is transferred into tub for bath wiht assist from staff, able to transfer on/off toilet herself but needs assist for clothing mgt and pericare, staff assist with IADL    OT Problem List: Decreased strength;Pain;Decreased range of motion;Decreased activity tolerance;Decreased safety awareness;Decreased knowledge of use of DME or AE;Impaired balance (sitting and/or standing);Obesity   OT Treatment/Interventions: Self-care/ADL training;Therapeutic exercise;Therapeutic activities;DME and/or AE instruction;Patient/family education;Balance training      OT Goals(Current goals can be found in the care plan section)   Acute Rehab OT Goals Patient Stated Goal: get better and have less pain OT Goal Formulation: With patient Time For Goal Achievement: 04/18/23 Potential to Achieve Goals: Fair ADL Goals Pt Will Perform Grooming: sitting;with contact guard assist;with supervision (>64min without LOB) Pt Will Perform Upper Body Dressing: with min assist;sitting Additional ADL Goal #1: Pt will tolerate sitting table top  task involvin dynamic reaching without LOB and able to self correct to midline, 3/3 opportunities.   OT Frequency:  Min 1X/week    Co-evaluation PT/OT/SLP Co-Evaluation/Treatment: Yes Reason for Co-Treatment: To address functional/ADL transfers;For patient/therapist safety PT goals addressed during session: Mobility/safety with mobility;Balance OT goals addressed during session: ADL's and self-care;Strengthening/ROM      AM-PAC OT "6 Clicks" Daily Activity     Outcome Measure Help from another person eating meals?: None Help from another person taking care of personal grooming?: A Little Help from another person toileting, which includes using toliet, bedpan, or urinal?: Total Help from another person bathing (including washing, rinsing, drying)?: A Lot Help from another person to put on and taking off regular upper body clothing?: A Lot Help from another person to put on and taking off regular lower body clothing?: A Lot 6 Click Score: 14   End of Session  Nurse Communication: Patient requests pain meds  Activity Tolerance: Patient tolerated treatment well Patient left: in bed;with call bell/phone within reach;with bed alarm set;with family/visitor present  OT Visit Diagnosis: Other abnormalities of gait and mobility (R26.89);Muscle weakness (generalized) (M62.81);History of falling (Z91.81);Pain Pain - part of body:  (neck)                Time: 1610-9604 OT Time Calculation (min): 33 min Charges:  OT General Charges $OT Visit: 1 Visit OT Evaluation $OT Eval Low Complexity: 1 Low OT Treatments $Therapeutic Activity: 8-22 mins  Arman Filter., MPH, MS, OTR/L ascom [phone] 04/04/23, 4:40 PM

## 2023-04-05 DIAGNOSIS — G9340 Encephalopathy, unspecified: Secondary | ICD-10-CM | POA: Diagnosis not present

## 2023-04-05 LAB — CBC WITH DIFFERENTIAL/PLATELET
Abs Immature Granulocytes: 0.05 10*3/uL (ref 0.00–0.07)
Basophils Absolute: 0 10*3/uL (ref 0.0–0.1)
Basophils Relative: 0 %
Eosinophils Absolute: 0.1 10*3/uL (ref 0.0–0.5)
Eosinophils Relative: 1 %
HCT: 37.4 % (ref 36.0–46.0)
Hemoglobin: 11.7 g/dL — ABNORMAL LOW (ref 12.0–15.0)
Immature Granulocytes: 1 %
Lymphocytes Relative: 13 %
Lymphs Abs: 1.1 10*3/uL (ref 0.7–4.0)
MCH: 27.2 pg (ref 26.0–34.0)
MCHC: 31.3 g/dL (ref 30.0–36.0)
MCV: 87 fL (ref 80.0–100.0)
Monocytes Absolute: 0.7 10*3/uL (ref 0.1–1.0)
Monocytes Relative: 8 %
Neutro Abs: 6.9 10*3/uL (ref 1.7–7.7)
Neutrophils Relative %: 77 %
Platelets: 194 10*3/uL (ref 150–400)
RBC: 4.3 MIL/uL (ref 3.87–5.11)
RDW: 15.5 % (ref 11.5–15.5)
WBC: 8.8 10*3/uL (ref 4.0–10.5)
nRBC: 0 % (ref 0.0–0.2)

## 2023-04-05 LAB — URINE CULTURE: Culture: >=100000 — AB

## 2023-04-05 LAB — BASIC METABOLIC PANEL
Anion gap: 10 (ref 5–15)
BUN: 21 mg/dL (ref 8–23)
CO2: 33 mmol/L — ABNORMAL HIGH (ref 22–32)
Calcium: 9.2 mg/dL (ref 8.9–10.3)
Chloride: 95 mmol/L — ABNORMAL LOW (ref 98–111)
Creatinine, Ser: 0.49 mg/dL (ref 0.44–1.00)
GFR, Estimated: >60 mL/min (ref >60)
Glucose, Bld: 161 mg/dL — ABNORMAL HIGH (ref 70–99)
Potassium: 3.4 mmol/L — ABNORMAL LOW (ref 3.5–5.1)
Sodium: 138 mmol/L (ref 135–145)

## 2023-04-05 LAB — GLUCOSE, CAPILLARY
Glucose-Capillary: 140 mg/dL — ABNORMAL HIGH (ref 70–99)
Glucose-Capillary: 225 mg/dL — ABNORMAL HIGH (ref 70–99)
Glucose-Capillary: 191 mg/dL — ABNORMAL HIGH (ref 70–99)

## 2023-04-05 MED ORDER — CEFADROXIL 500 MG PO CAPS
500.0000 mg | ORAL_CAPSULE | Freq: Two times a day (BID) | ORAL | Status: AC
  Administered 2023-04-06 – 2023-04-07 (×4): 500 mg via ORAL
  Filled 2023-04-05 (×4): qty 1

## 2023-04-05 MED ORDER — POTASSIUM CHLORIDE CRYS ER 20 MEQ PO TBCR
40.0000 meq | EXTENDED_RELEASE_TABLET | ORAL | Status: AC
  Administered 2023-04-05 (×2): 40 meq via ORAL
  Filled 2023-04-05 (×2): qty 2

## 2023-04-05 NOTE — Plan of Care (Signed)

## 2023-04-05 NOTE — Plan of Care (Signed)
  Problem: Education: Goal: Knowledge of General Education information will improve Description: Including pain rating scale, medication(s)/side effects and non-pharmacologic comfort measures Outcome: Progressing   Problem: Health Behavior/Discharge Planning: Goal: Ability to manage health-related needs will improve Outcome: Progressing   Problem: Clinical Measurements: Goal: Ability to maintain clinical measurements within normal limits will improve Outcome: Progressing Goal: Will remain free from infection Outcome: Progressing Goal: Diagnostic test results will improve Outcome: Progressing Goal: Cardiovascular complication will be avoided Outcome: Progressing   Problem: Activity: Goal: Risk for activity intolerance will decrease Outcome: Progressing   Problem: Coping: Goal: Level of anxiety will decrease Outcome: Progressing   Problem: Elimination: Goal: Will not experience complications related to bowel motility Outcome: Progressing Goal: Will not experience complications related to urinary retention Outcome: Progressing   Problem: Pain Managment: Goal: General experience of comfort will improve and/or be controlled Outcome: Progressing   Problem: Safety: Goal: Ability to remain free from injury will improve Outcome: Progressing

## 2023-04-05 NOTE — Progress Notes (Signed)
 Progress Note   Patient: [name] ZOX:[id number] DOB: 1940-02-04 DOA: 04/03/2023     2 DOS: the patient was seen and examined on 04/05/2023    Brief hospital course:  [name] is a 83 y.o. 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  Patient is a resident at [facility] facility   ED course:  Urinalysis with large hematuria, moderate leukocytes, many bacteria.  CT head obtained with no acute abnormality.  Chest x-ray obtained with vascular congestion but no overt pulmonary edema.  CT soft tissue of the neck and CT renal stone study ordered and pending.  Patient started on Rocephin and IV fluids.  TRH contacted for admission.     Assessment and Plan: Acute metabolic encephalopathy Patient was sent to the ED from facility due to increasing generalized weakness for 1 week in addition to altered mental status.   Likely secondary to dehydration and UTI Respiratory panel is negative Continue ceftriaxone Continue to follow-up on culture results   Urinary tract infection UA with hematuria and pyuria with AMS, concerning for UTI. Patient is unable to contribute any history.  No evidence of sepsis at this time.  CT stone negative for nephrolithiasis. Continue management as above   Neck pain-improved Patient is presenting with significant neck pain on presentation with unknown duration.  CT imaging is negative. Mental status improved Continue as needed pain management   Chronic respiratory failure with hypoxia and hypercapnia (HCC) Patient has a history of chronic respiratory failure on 2 L, with oxygen levels within goal.   Continue CPAP at night     Chronic HFmrEF w/ recovered EF History of heart  failure with moderately reduced EF, with recovered EF.  Last echo in May 2024 demonstrating LVEF of 50-55%.  She appears hypovolemic at this time and BNP is less than previous measurements. Continue Daily weights Home medications resumed   Depression - Home medications resumed   Hypertension associated with diabetes (HCC) - Home antihypertensives resumed   Type 2 diabetes mellitus (HCC) Diet controlled type 2 diabetes, with glucose elevated at 153. Monitor glucose level Continue insulin therapy   Chronic atrial fibrillation (HCC) Chronic atrial fibrillation, now with ventricular pacing. Continue Eliquis   Advance Care Planning:   Code Status: Limited: Do not attempt resuscitation (DNR) -DNR-LIMITED -Do Not Intubate/DNI Gold form signed at bedside.  Patient's friend confirms that these have always been her wishes.   Consults: None     Subjective:  Seen and examined this morning Denies nausea vomiting She still feels weak Mental status improved   Physical Exam:   General: Obese lady laying in bed in no acute distress HEENT: Eyes:     Extraocular Movements: Extraocular movements intact.     Pupils: Pupils are equal, round, and reactive to light.  Neck: Did have some tenderness on the right shoulder area Cardiovascular:     Rate and Rhythm: Normal rate and regular rhythm.     Heart sounds: No murmur heard. Pulmonary:     Effort: Pulmonary effort is normal. No tachypnea.  Abdominal:     General: Bowel sounds are normal. There is no distension.     Palpations: Abdomen is soft.     Tenderness: There is no abdominal tenderness. There is  no guarding.  Musculoskeletal:     Right lower leg: No edema.     Left lower leg: No edema.  Skin:    General: Skin is warm and dry.  Neurological: Mental status improved   Data Reviewed:    Latest Ref Rng & Units 04/05/2023    5:23 AM 04/04/2023    2:48 AM 04/03/2023    3:41 PM  CBC  WBC 4.0 - 10.5 K/uL 8.8  8.8  11.1   Hemoglobin  12.0 - 15.0 g/dL 16.1  09.6  04.5   Hematocrit 36.0 - 46.0 % 37.4  38.6  38.7   Platelets 150 - 400 K/uL 194  173  180        Latest Ref Rng & Units 04/05/2023    5:23 AM 04/04/2023    2:48 AM 04/03/2023   12:59 PM  BMP  Glucose 70 - 99 mg/dL 409  811  914   BUN 8 - 23 mg/dL 21  21  28    Creatinine 0.44 - 1.00 mg/dL 7.82  9.56  2.13   Sodium 135 - 145 mmol/L 138  140  138   Potassium 3.5 - 5.1 mmol/L 3.4  3.0  3.4   Chloride 98 - 111 mmol/L 95  93  88   CO2 22 - 32 mmol/L 33  36  37   Calcium 8.9 - 10.3 mg/dL 9.2  8.6  9.0      Vitals:   04/05/23 0036 04/05/23 0559 04/05/23 0909 04/05/23 1327  BP:  111/61 (!) 129/43 (!) 134/57  Pulse:  70 68 69  Resp:  20 19 18   Temp:  98.2 F (36.8 C) 97.7 F (36.5 C) (!) 97.4 F (36.3 C)  TempSrc:   Oral Oral  SpO2: 95% 96% 94% 97%  Weight:      Height:           Author: Loyce Dys, MD 04/05/2023 3:41 PM  For on call review [email or web address].

## 2023-04-05 NOTE — TOC Progression Note (Signed)
 Transition of Care [facility]) - Progression Note    Patient Details  Name: [name] MRN: [id number] Date of Birth: 07-05-1940  Transition of Care [facility]) CM/SW Contact  Bing Quarry, RN Phone Number: 04/05/2023, 10:49 AM  Clinical Narrative: 3/2: Admitted via [facility] from [facility] facility. C/o weakness worsening. AMS, A7O x4 at baseline. Pt alert to day only on admission. Wears 2 liters oxygen at baseline. Diagnosed with UTI/AMS/Dehydration.  TOC to follow for discharge planning/return to [facility].    Gabriel Cirri MSN RN CM  RN Case Manager [place]  Transitions of Care Direct Dial: [phone] (Weekends Only) [facility] Main Office Phone: [phone] [facility] Fax: [phone] [email or web address]           Expected Discharge Plan and Services                                               Social Determinants of Health (SDOH) Interventions SDOH Screenings   Food Insecurity: No Food Insecurity (04/04/2023)  Housing: Low Risk  (04/04/2023)  Transportation Needs: No Transportation Needs (04/04/2023)  Utilities: Not At Risk (04/04/2023)  Social Connections: Moderately Isolated (04/04/2023)  Tobacco Use: Medium Risk (02/03/2023)    Readmission Risk Interventions     No data to display

## 2023-04-06 DIAGNOSIS — G9340 Encephalopathy, unspecified: Secondary | ICD-10-CM | POA: Diagnosis not present

## 2023-04-06 LAB — CBC WITH DIFFERENTIAL/PLATELET
Abs Immature Granulocytes: 0.07 10*3/uL (ref 0.00–0.07)
Basophils Absolute: 0 10*3/uL (ref 0.0–0.1)
Basophils Relative: 0 %
Eosinophils Absolute: 0.1 10*3/uL (ref 0.0–0.5)
Eosinophils Relative: 1 %
HCT: 38.4 % (ref 36.0–46.0)
Hemoglobin: 11.9 g/dL — ABNORMAL LOW (ref 12.0–15.0)
Immature Granulocytes: 1 %
Lymphocytes Relative: 16 %
Lymphs Abs: 1.5 10*3/uL (ref 0.7–4.0)
MCH: 27.3 pg (ref 26.0–34.0)
MCHC: 31 g/dL (ref 30.0–36.0)
MCV: 88.1 fL (ref 80.0–100.0)
Monocytes Absolute: 0.8 10*3/uL (ref 0.1–1.0)
Monocytes Relative: 9 %
Neutro Abs: 7.2 10*3/uL (ref 1.7–7.7)
Neutrophils Relative %: 73 %
Platelets: 230 10*3/uL (ref 150–400)
RBC: 4.36 MIL/uL (ref 3.87–5.11)
RDW: 15.8 % — ABNORMAL HIGH (ref 11.5–15.5)
WBC: 9.7 10*3/uL (ref 4.0–10.5)
nRBC: 0 % (ref 0.0–0.2)

## 2023-04-06 LAB — BASIC METABOLIC PANEL
Anion gap: 8 (ref 5–15)
BUN: 21 mg/dL (ref 8–23)
CO2: 32 mmol/L (ref 22–32)
Calcium: 9.6 mg/dL (ref 8.9–10.3)
Chloride: 99 mmol/L (ref 98–111)
Creatinine, Ser: 0.6 mg/dL (ref 0.44–1.00)
GFR, Estimated: >60 mL/min (ref >60)
Glucose, Bld: 170 mg/dL — ABNORMAL HIGH (ref 70–99)
Potassium: 3.8 mmol/L (ref 3.5–5.1)
Sodium: 139 mmol/L (ref 135–145)

## 2023-04-06 LAB — GLUCOSE, CAPILLARY
Glucose-Capillary: 169 mg/dL — ABNORMAL HIGH (ref 70–99)
Glucose-Capillary: 172 mg/dL — ABNORMAL HIGH (ref 70–99)
Glucose-Capillary: 173 mg/dL — ABNORMAL HIGH (ref 70–99)
Glucose-Capillary: 165 mg/dL — ABNORMAL HIGH (ref 70–99)

## 2023-04-06 MED ORDER — ADULT MULTIVITAMIN W/MINERALS CH
1.0000 | ORAL_TABLET | Freq: Every day | ORAL | Status: DC
  Administered 2023-04-06 – 2023-04-08 (×3): 1 via ORAL
  Filled 2023-04-06 (×3): qty 1

## 2023-04-06 NOTE — Progress Notes (Signed)
 Progress Note   Patient: [name] NGE:[id number] DOB: 05-03-1940 DOA: 04/03/2023     3 DOS: the patient was seen and examined on 04/06/2023      Brief hospital course:  ACELYNN DEJONGE is a 83 y.o. 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  Patient is a resident at [facility] facility   ED course:  Urinalysis with large hematuria, moderate leukocytes, many bacteria.  CT head obtained with no acute abnormality.  Chest x-ray obtained with vascular congestion but no overt pulmonary edema.  CT soft tissue of the neck and CT renal stone study ordered and pending.  Patient started on Rocephin and IV fluids.  TRH contacted for admission.     Assessment and Plan: Acute metabolic encephalopathy Patient was sent to the ED from facility due to increasing generalized weakness for 1 week in addition to altered mental status.   Likely secondary to dehydration and UTI Respiratory panel is negative Antibiotics changed to oral to complete 5 days course Urine culture growing pansensitive E. coli   Urinary tract infection Continue management as above   Neck pain-improved Patient is presenting with significant neck pain on presentation with unknown duration.  CT imaging is negative. Continue as needed pain management   Chronic respiratory failure with hypoxia and hypercapnia (HCC) Patient has a history of chronic respiratory failure on 2 L, with oxygen levels within goal.   Continue CPAP at night     Chronic HFmrEF w/ recovered EF History of heart failure with moderately reduced EF, with recovered EF.  Last echo in May 2024 demonstrating LVEF of 50-55%.  She appears hypovolemic at this time and BNP is less than  previous measurements. Continue daily weight Continue home medications resumed   Depression - Home medications resumed   Hypertension associated with diabetes (HCC) - Home antihypertensives resumed   Type 2 diabetes mellitus (HCC) Diet controlled type 2 diabetes, with glucose elevated at 153. Monitor glucose level Continue insulin therapy   Chronic atrial fibrillation (HCC) Chronic atrial fibrillation, now with ventricular pacing. Continue Eliquis   Advance Care Planning:   Code Status: Limited: Do not attempt resuscitation (DNR) -DNR-LIMITED -Do Not Intubate/DNI Gold form signed at bedside.  Patient's friend confirms that these have always been her wishes.   Consults: None     Subjective:  Patient seen and examined at bedside this morning Admits to improvement in general condition Denies nausea vomiting abdominal pain chest pain or cough   Physical Exam:   General: Obese lady laying in bed in no acute distress HEENT: Eyes:     Extraocular Movements: Extraocular movements intact.     Pupils: Pupils are equal, round, and reactive to light.  Neck: Did have some tenderness on the right shoulder area Cardiovascular:     Rate and Rhythm: Normal rate and regular rhythm.     Heart sounds: No murmur heard. Pulmonary:     Effort: Pulmonary effort is normal. No tachypnea.  Abdominal:     General: Bowel sounds are normal. There is no distension.     Palpations: Abdomen is soft.     Tenderness: There is no abdominal tenderness. There is no guarding.  Musculoskeletal:     Right lower leg: No edema.  Left lower leg: No edema.  Skin:    General: Skin is warm and dry.  Neurological: Mental status improved   Data Reviewed:    Latest Ref Rng & Units 04/06/2023    5:28 AM 04/05/2023    5:23 AM 04/04/2023    2:48 AM  CBC  WBC 4.0 - 10.5 K/uL 9.7  8.8  8.8   Hemoglobin 12.0 - 15.0 g/dL 08.6  57.8  46.9   Hematocrit 36.0 - 46.0 % 38.4  37.4  38.6   Platelets 150 - 400 K/uL 230   194  173        Latest Ref Rng & Units 04/06/2023    5:28 AM 04/05/2023    5:23 AM 04/04/2023    2:48 AM  BMP  Glucose 70 - 99 mg/dL 629  528  413   BUN 8 - 23 mg/dL 21  21  21    Creatinine 0.44 - 1.00 mg/dL 2.44  0.10  2.72   Sodium 135 - 145 mmol/L 139  138  140   Potassium 3.5 - 5.1 mmol/L 3.8  3.4  3.0   Chloride 98 - 111 mmol/L 99  95  93   CO2 22 - 32 mmol/L 32  33  36   Calcium 8.9 - 10.3 mg/dL 9.6  9.2  8.6      Vitals:   04/05/23 2009 04/05/23 2343 04/06/23 0523 04/06/23 0911  BP:  (!) 116/45 (!) 140/63 (!) 147/61  Pulse:  69 70 70  Resp:  20 16   Temp:  97.6 F (36.4 C) 97.7 F (36.5 C) 98 F (36.7 C)  TempSrc:    Oral  SpO2: 96% 95% 97% 93%  Weight:      Height:       Disposition: TOC working on placement, patient medically ready  Author: Loyce Dys, MD 04/06/2023 3:19 PM  For on call review [email or web address].

## 2023-04-06 NOTE — Plan of Care (Signed)
  Problem: Education: Goal: Knowledge of General Education information will improve Description: Including pain rating scale, medication(s)/side effects and non-pharmacologic comfort measures Outcome: Progressing   Problem: Health Behavior/Discharge Planning: Goal: Ability to manage health-related needs will improve Outcome: Progressing   Problem: Clinical Measurements: Goal: Ability to maintain clinical measurements within normal limits will improve Outcome: Progressing Goal: Will remain free from infection Outcome: Progressing Goal: Diagnostic test results will improve Outcome: Progressing   Problem: Coping: Goal: Level of anxiety will decrease Outcome: Progressing   Problem: Elimination: Goal: Will not experience complications related to urinary retention Outcome: Progressing   Problem: Pain Managment: Goal: General experience of comfort will improve and/or be controlled Outcome: Progressing   Problem: Skin Integrity: Goal: Risk for impaired skin integrity will decrease Outcome: Progressing

## 2023-04-06 NOTE — Progress Notes (Signed)
 Initial Nutrition Assessment  DOCUMENTATION CODES:   Obesity unspecified  INTERVENTION:   -MVI with minerals daily -Continue Ensure Enlive po BID, each supplement provides 350 kcal and 20 grams of protein.  -Liberalize diet to regular for wider variety of meal selections -Feeding assistance with meals  NUTRITION DIAGNOSIS:   Inadequate oral intake related to lethargy/confusion as evidenced by meal completion < 25%.  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  Pt admitted with acute encephalopathy, AMS, and UTI.   Reviewed I/O's: +543 ml x 24 hours and +295 ml since admission  Per H&P, pt is a resident of [facility]. Pt has been experiencing weakness for the past week PTA, which worsened over the past 24 hours PTA. At baseline, she is alert and oriented and primarily wheelchair bound. Noted pt with UTI which was treated 1-1.5 weeks PTA.   Pt currently on a heart healthy diet. Noted meal completions 0-100%.   Pt sleeping soundly at time of visit. No support at bedside. Pt did not arouse to voice or touch. Meal tray pm tray table untouched.   Reviewed wt hx; pt has experienced a 4.6% wt loss over the past month, which is not significant for time frame.   Medications reviewed and include protonix.   Lab Results  Component Value Date   HGBA1C 7.1 (H) 04/03/2023   PTA DM medications are none.   Labs reviewed: CBGS: 140-225 (inpatient orders for glycemic control are none).    NUTRITION - FOCUSED PHYSICAL  EXAM:  Flowsheet Row Most Recent Value  Orbital Region No depletion  Upper Arm Region No depletion  Thoracic and Lumbar Region No depletion  Buccal Region No depletion  Temple Region No depletion  Clavicle Bone Region No depletion  Clavicle and Acromion Bone Region No depletion  Scapular Bone Region No depletion  Dorsal Hand No depletion  Patellar Region No depletion  Anterior Thigh Region No depletion  Posterior Calf Region No depletion  Edema (RD Assessment) Mild  Hair Reviewed  Eyes Reviewed  Mouth Reviewed  Skin Reviewed  Nails Reviewed       Diet Order:   Diet Order             Diet Heart Room service appropriate? Yes; Fluid consistency: Thin  Diet effective now                   EDUCATION NEEDS:   Not appropriate for education at this time  Skin:  Skin Assessment: Reviewed RN Assessment  Last BM:  04/05/23  Height:   Ht Readings from Last 1 Encounters:  04/03/23 5\' 3"  (1.6 m)    Weight:   Wt Readings from Last 1 Encounters:  04/04/23 108.7 kg    Ideal Body Weight:  52.3 kg  BMI:  Body mass index is 42.45 kg/m.  Estimated Nutritional Needs:   Kcal:  1550-1750  Protein:  75-90 grams  Fluid:  > 1.5 L    Levada Schilling, RD, LDN, CDCES Registered Dietitian III Certified Diabetes Care and Education Specialist If unable to  reach this RD, please use "RD Inpatient" group chat on secure chat between hours of 8am-4 pm daily

## 2023-04-06 NOTE — Plan of Care (Signed)

## 2023-04-06 NOTE — Care Management Important Message (Signed)
 Important Message  Patient Details  Name: [name] MRN: [id number] Date of Birth: November 25, 1940   Important Message Given:  Yes - Medicare IM     Cristela Blue, CMA 04/06/2023, 10:59 AM

## 2023-04-07 DIAGNOSIS — G9340 Encephalopathy, unspecified: Secondary | ICD-10-CM | POA: Diagnosis not present

## 2023-04-07 LAB — CBC WITH DIFFERENTIAL/PLATELET
Abs Immature Granulocytes: 0.05 10*3/uL (ref 0.00–0.07)
Basophils Absolute: 0 10*3/uL (ref 0.0–0.1)
Basophils Relative: 0 %
Eosinophils Absolute: 0.1 10*3/uL (ref 0.0–0.5)
Eosinophils Relative: 1 %
HCT: 39 % (ref 36.0–46.0)
Hemoglobin: 12 g/dL (ref 12.0–15.0)
Immature Granulocytes: 1 %
Lymphocytes Relative: 16 %
Lymphs Abs: 1.5 10*3/uL (ref 0.7–4.0)
MCH: 27 pg (ref 26.0–34.0)
MCHC: 30.8 g/dL (ref 30.0–36.0)
MCV: 87.6 fL (ref 80.0–100.0)
Monocytes Absolute: 0.8 10*3/uL (ref 0.1–1.0)
Monocytes Relative: 8 %
Neutro Abs: 6.7 10*3/uL (ref 1.7–7.7)
Neutrophils Relative %: 74 %
Platelets: 259 10*3/uL (ref 150–400)
RBC: 4.45 MIL/uL (ref 3.87–5.11)
RDW: 15.6 % — ABNORMAL HIGH (ref 11.5–15.5)
WBC: 9.1 10*3/uL (ref 4.0–10.5)
nRBC: 0 % (ref 0.0–0.2)

## 2023-04-07 LAB — BASIC METABOLIC PANEL
Anion gap: 11 (ref 5–15)
BUN: 26 mg/dL — ABNORMAL HIGH (ref 8–23)
CO2: 31 mmol/L (ref 22–32)
Calcium: 9.4 mg/dL (ref 8.9–10.3)
Chloride: 97 mmol/L — ABNORMAL LOW (ref 98–111)
Creatinine, Ser: 0.64 mg/dL (ref 0.44–1.00)
GFR, Estimated: >60 mL/min (ref >60)
Glucose, Bld: 169 mg/dL — ABNORMAL HIGH (ref 70–99)
Potassium: 3.6 mmol/L (ref 3.5–5.1)
Sodium: 139 mmol/L (ref 135–145)

## 2023-04-07 LAB — GLUCOSE, CAPILLARY
Glucose-Capillary: 191 mg/dL — ABNORMAL HIGH (ref 70–99)
Glucose-Capillary: 198 mg/dL — ABNORMAL HIGH (ref 70–99)
Glucose-Capillary: 249 mg/dL — ABNORMAL HIGH (ref 70–99)

## 2023-04-07 NOTE — Progress Notes (Signed)
 Progress Note   Patient: [name] MCN:[id number] DOB: Jun 14, 1940 DOA: 04/03/2023     4 DOS: the patient was seen and examined on 04/07/2023    Brief hospital course:  MOSELLA KASA is a 83 y.o. 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  Patient is a resident at [facility] facility   ED course:  Urinalysis with large hematuria, moderate leukocytes, many bacteria.  CT head obtained with no acute abnormality.  Chest x-ray obtained with vascular congestion but no overt pulmonary edema.  CT soft tissue of the neck and CT renal stone study ordered and pending.  Patient started on Rocephin and IV fluids.  TRH contacted for admission.     Assessment and Plan: Acute metabolic encephalopathy Patient was sent to the ED from facility due to increasing generalized weakness for 1 week in addition to altered mental status.   Likely secondary to dehydration and UTI Respiratory panel is negative Antibiotics changed to oral to complete 5 days course Urine culture growing pansensitive E. coli   Urinary tract infection Continue management as above   Neck pain-improved Patient is presenting with significant neck pain on presentation with unknown duration.  CT imaging is negative. Continue as needed pain management   Chronic respiratory failure with hypoxia and hypercapnia (HCC) Patient has a history of chronic respiratory failure on 2 L, with oxygen levels within goal.   Continue CPAP at night     Chronic HFmrEF w/ recovered EF History of heart failure with moderately reduced EF, with recovered EF.  Last echo in May 2024 demonstrating LVEF of 50-55%.  She appears hypovolemic at this time and BNP is less than  previous measurements. Continue daily weight Continue home medications resumed   Depression - Home medications resumed   Hypertension associated with diabetes (HCC) - Home antihypertensives resumed   Type 2 diabetes mellitus (HCC) Diet controlled type 2 diabetes, with glucose elevated at 153. Monitor glucose level Continue insulin therapy   Chronic atrial fibrillation (HCC) Chronic atrial fibrillation, now with ventricular pacing. Continue Eliquis   Advance Care Planning:   Code Status: Limited: Do not attempt resuscitation (DNR) -DNR-LIMITED -Do Not Intubate/DNI Gold form signed at bedside.  Patient's friend confirms that these have always been her wishes.   Consults: None     Subjective:  Patient seen and examined at bedside this morning Denies nausea vomiting abdominal pain or chest pain TOC working on placement   Physical Exam:   General: Obese lady laying in bed in no acute distress HEENT: Eyes:     Extraocular Movements: Extraocular movements intact.     Pupils: Pupils are equal, round, and reactive to light.  Neck: Did have some tenderness on the right shoulder area Cardiovascular:     Rate and Rhythm: Normal rate and regular rhythm.     Heart sounds: No murmur heard. Pulmonary:     Effort: Pulmonary effort is normal. No tachypnea.  Abdominal:     General: Bowel sounds are normal. There is no distension.     Palpations: Abdomen is soft.     Tenderness: There is no abdominal tenderness. There is no guarding.  Musculoskeletal:     Right lower leg: No edema.     Left lower leg: No  edema.  Skin:    General: Skin is warm and dry.  Neurological: Mental status improved   Data Reviewed:    Latest Ref Rng & Units 04/07/2023    4:17 AM 04/06/2023    5:28 AM 04/05/2023    5:23 AM  BMP  Glucose 70 - 99 mg/dL 161  096  045   BUN 8 - 23 mg/dL 26  21  21    Creatinine 0.44 - 1.00 mg/dL 4.09  8.11  9.14   Sodium 135 - 145 mmol/L 139  139  138   Potassium 3.5 - 5.1  mmol/L 3.6  3.8  3.4   Chloride 98 - 111 mmol/L 97  99  95   CO2 22 - 32 mmol/L 31  32  33   Calcium 8.9 - 10.3 mg/dL 9.4  9.6  9.2      Vitals:   04/07/23 0006 04/07/23 0358 04/07/23 1017 04/07/23 1215  BP: (!) 138/52 (!) 134/59 (!) 133/51 131/60  Pulse: 70 70 69 71  Resp: 20 20 16 16   Temp: 97.7 F (36.5 C)  97.7 F (36.5 C) 97.9 F (36.6 C)  TempSrc: Oral  Oral Oral  SpO2: 96% 95% 94% 95%  Weight:      Height:          Latest Ref Rng & Units 04/07/2023    4:17 AM 04/06/2023    5:28 AM 04/05/2023    5:23 AM  CBC  WBC 4.0 - 10.5 K/uL 9.1  9.7  8.8   Hemoglobin 12.0 - 15.0 g/dL 78.2  95.6  21.3   Hematocrit 36.0 - 46.0 % 39.0  38.4  37.4   Platelets 150 - 400 K/uL 259  230  194      Author: Loyce Dys, MD 04/07/2023 4:58 PM  For on call review [email or web address].

## 2023-04-07 NOTE — Progress Notes (Signed)
 Physical Therapy Treatment Patient Details Name: [name] MRN: [id number] DOB: 1940/05/19 Today's Date: 04/07/2023   History of Present Illness 83 y.o. 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  Patient is a resident at [facility] facility.    PT Comments  Co-tx for improved outcomes and pt needs.  She is able to get to EOB with heavy mod/max a x 2.  Initially needs min assist to sit but with time, is able to maintain balance on her own.  She stands x 2 at EOB with mod a x 2 with cues to stand fully for brief periods of time.  On 3rd attempt she is able to stand and pivot to recliner at bedside with heavy mod/max a x 2 mostly to pivot and control descent into chair.  Overall does well with transfer.  Discussed with Tech recommendation for +2 assist to return to bed and suggested Stedy to return to bed.      If plan is discharge home, recommend the following: Two people to help with walking and/or transfers;Two people to help with bathing/dressing/bathroom;Direct supervision/assist for medications management;Help with stairs or ramp for entrance;Assist for transportation;Assistance with feeding;Assistance with cooking/housework   Can travel by private vehicle        Equipment Recommendations       Recommendations for Other Services       Precautions / Restrictions Precautions Precautions: Fall Restrictions Weight Bearing Restrictions Per Provider Order: No     Mobility  Bed Mobility Overal bed mobility: Needs Assistance Bed Mobility: Supine to Sit, Sit to Supine     Supine to sit: Max assist, +2 for physical assistance Sit to supine: Max assist, +2 for physical assistance     Patient  Response: Cooperative  Transfers Overall transfer level: Needs assistance Equipment used: Rolling walker (2 wheels) Transfers: Sit to/from Stand, Bed to chair/wheelchair/BSC Sit to Stand: Mod assist, +2 safety/equipment     Squat pivot transfers: +2 physical assistance, Mod assist, Max assist     General transfer comment: no true steps and guided by staff to chair    Ambulation/Gait               General Gait Details: unable   Stairs             Wheelchair Mobility     Tilt Bed Tilt Bed Patient Response: Cooperative  Modified Rankin (Stroke Patients Only)       Balance Overall balance assessment: Needs assistance Sitting-balance support: Feet supported Sitting balance-Leahy Scale: Fair Sitting balance - Comments: Pt initially needing assist for balance but is able to maintain with cues and times                                    Communication    Cognition Arousal: Alert Behavior During Therapy: [facility] for tasks assessed/performed                           PT - Cognition Comments: delayed processing, speaks slowly but oriented, able to answer PLOF and friend at bedside assisted with PLOF as needed   Following commands impaired: Follows one step  commands with increased time    Cueing    Exercises      General Comments        Pertinent Vitals/Pain Pain Assessment Pain Assessment: No/denies pain    Home Living                          Prior Function            PT Goals (current goals can now be found in the care plan section) Progress towards PT goals: Progressing toward goals    Frequency    Min 1X/week      PT Plan      Co-evaluation PT/OT/SLP Co-Evaluation/Treatment: Yes            AM-PAC PT "6 Clicks" Mobility   Outcome Measure  Help needed turning from your back to your side while in a flat bed without using bedrails?: Total Help needed moving from lying on your back to sitting  on the side of a flat bed without using bedrails?: Total Help needed moving to and from a bed to a chair (including a wheelchair)?: A Lot Help needed standing up from a chair using your arms (e.g., wheelchair or bedside chair)?: A Lot Help needed to walk in hospital room?: Total Help needed climbing 3-5 steps with a railing? : Total 6 Click Score: 8    End of Session Equipment Utilized During Treatment: Gait belt Activity Tolerance: Patient limited by fatigue Patient left: in chair;with chair alarm set;with call bell/phone within reach Nurse Communication: Mobility status PT Visit Diagnosis: Other abnormalities of gait and mobility (R26.89);Difficulty in walking, not elsewhere classified (R26.2);Muscle weakness (generalized) (M62.81)     Time: 3474-2595 PT Time Calculation (min) (ACUTE ONLY): 16 min  Charges:    $Therapeutic Activity: 8-22 mins PT General Charges $$ ACUTE PT VISIT: 1 Visit                   Danielle Dess, PTA 04/07/23, 11:37 AM

## 2023-04-07 NOTE — Plan of Care (Signed)
  Problem: Education: Goal: Knowledge of General Education information will improve Description: Including pain rating scale, medication(s)/side effects and non-pharmacologic comfort measures Outcome: Progressing   Problem: Health Behavior/Discharge Planning: Goal: Ability to manage health-related needs will improve Outcome: Progressing   Problem: Pain Managment: Goal: General experience of comfort will improve and/or be controlled Outcome: Progressing   Problem: Safety: Goal: Ability to remain free from injury will improve Outcome: Progressing

## 2023-04-07 NOTE — TOC Initial Note (Signed)
 Transition of Care [facility]) - Initial/Assessment Note    Patient Details  Name: [name] MRN: [id number] Date of Birth: 06/03/1940  Transition of Care [facility]) CM/SW Contact:    Margarito Liner, LCSW Phone Number: 04/07/2023, 3:53 PM  Clinical Narrative:   Patient not fully oriented. No family at bedside. Per chart review, patient admitted from [facility] SNF. Admissions coordinator confirmed she is a long-term resident and they can bring her to the rehab side if insurance approves. CSW called friend [name]. She will update son. CSW started insurance authorization.               Expected Discharge Plan: Skilled Nursing Facility Barriers to Discharge: Continued Medical Work up   Patient Goals and CMS Choice            Expected Discharge Plan and Services     Post Acute Care Choice: Skilled Nursing Facility Living arrangements for the past 2 months: Skilled Nursing Facility                                      Prior Living Arrangements/Services Living arrangements for the past 2 months: Skilled Nursing Facility Lives with:: Facility Resident Patient language and need for interpreter reviewed:: Yes        Need for Family Participation in Patient Care: Yes (Comment) Care giver support system in place?: Yes (comment)   Criminal Activity/Legal Involvement Pertinent to Current Situation/Hospitalization: No - Comment as needed  Activities of Daily Living   ADL Screening (condition at time of admission) Independently performs ADLs?: No Does the patient have a NEW difficulty with bathing/dressing/toileting/self-feeding that is expected to last >3 days?: No Does the patient have a NEW difficulty with getting in/out of bed, walking, or climbing stairs that is expected to last >3 days?: Yes (Initiates electronic notice to provider for possible PT consult) Does the patient have a NEW difficulty with communication that is expected to last >3 days?: No Is the patient deaf  or have difficulty hearing?: Yes Does the patient have difficulty seeing, even when wearing glasses/contacts?: Yes Does the patient have difficulty concentrating, remembering, or making decisions?: Yes  Permission Sought/Granted                  Emotional Assessment       Orientation: : Oriented to Self, Oriented to Place, Oriented to  Time Alcohol / Substance Use: Not Applicable Psych Involvement: No (comment)  Admission diagnosis:  Acute encephalopathy [G93.40] Urinary tract infection without hematuria, site unspecified [N39.0] Altered mental status, unspecified altered mental status type [R41.82] Patient Active Problem List   Diagnosis Date Noted   Acute encephalopathy 04/03/2023   Urinary tract infection 04/03/2023   Chronic HFmrEF w/ recovered EF 04/03/2023   Neck pain 04/03/2023   AKI (acute kidney injury) (HCC) 07/12/2020   Acute kidney injury (HCC) 07/11/2020   Sleep apnea    Chronic atrial fibrillation (HCC)    Elevated troponin    Elevated LFTs    Chronic respiratory failure with hypoxia and hypercapnia (HCC)    Type 2 diabetes mellitus (HCC)    Hypertension associated with diabetes (HCC)    Hyperlipidemia associated with type 2 diabetes mellitus (HCC)    Generalized weakness 04/12/2020   Obesity, Class III, BMI 40-49.9 (morbid obesity) (HCC) 04/12/2020   Obesity, diabetes, and hypertension syndrome (HCC) 04/12/2020   Metabolic syndrome 04/12/2020   Shortness of  breath 04/12/2020   Mild aortic valve stenosis 01/24/2020   Overdose of coumadin, accidental or unintentional, initial encounter 07/29/2019   B12 deficiency 11/25/2014   Depression 11/25/2014   Moderate mitral insufficiency 11/23/2014   Memory loss 07/10/2014   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   Piriformis syndrome 06/18/2014   Facet syndrome, lumbar 06/18/2014   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/2014   DDD (degenerative disc disease), lumbar 06/18/2014   DDD (degenerative disc disease), cervical  06/08/2014   DDD (degenerative disc disease), 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   Benign essential hypertension 05/22/2014   Overactive bladder 05/04/2014   RLS (restless legs syndrome) 02/09/2014   Moderate tricuspid insufficiency 02/01/2014   CAD (coronary artery disease) 11/30/2012   Implantable cardioverter-defibrillator (ICD) in situ 11/30/2012   Cervical spinal stenosis 04/09/2011   PCP:  Gracelyn Nurse, MD Pharmacy:   [facility] - [place], [place] - [address] [place] [address] Phone: [phone] Fax: [phone]  [facility] - [place], [place] - [address] [place] [address] Phone: [phone] Fax: [phone]     Social Drivers of Health (SDOH) Social History: SDOH Screenings   Food Insecurity: No Food Insecurity (04/04/2023)  Housing: Low Risk  (04/04/2023)  Transportation Needs: No Transportation Needs (04/04/2023)  Utilities: Not At Risk (04/04/2023)  Social Connections: Moderately Isolated (04/04/2023)  Tobacco Use: Medium Risk (02/03/2023)   SDOH Interventions:     Readmission Risk Interventions     No data to display

## 2023-04-07 NOTE — Progress Notes (Signed)
 Occupational Therapy Treatment Patient Details Name: [name] MRN: [id number] DOB: 20-Dec-1940 Today's Date: 04/07/2023   History of present illness 83 y.o. 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  Patient is a resident at [facility] facility.   OT comments  Chart reviewed to date, pt greeted semi supine in bed, agreeable to OT tx session. Co tx completed with PT on this date. Tx session targeted improving functional activity tolerance in preparation for ADL tasks. Improvements noted throughout with pt sitting on edge of bed progressing to supervision-CGA, 3 STS completed with MOD A +2 with RW, transfer to bedside chair via squat pivot (pt unable to take steps, fearful of falling) with MOD-MAX A +2. Supervision required for grooming tasks once seated in bedside chair. OT will continue to follow acutely to address functional deficits and to facilitate optimal ADL performance as pt continues to perform below PLOF.       If plan is discharge home, recommend the following:  Two people to help with walking and/or transfers;A lot of help with bathing/dressing/bathroom;Direct supervision/assist for medications management;Assist for transportation;Assistance with cooking/housework;Help with stairs or ramp for entrance   Equipment Recommendations  Other (comment) (defer)    Recommendations for Other Services      Precautions / Restrictions Precautions Precautions: Fall Recall of Precautions/Restrictions: Impaired       Mobility Bed Mobility Overal bed mobility: Needs Assistance Bed Mobility: Supine to Sit, Sit to Supine     Supine to sit: Max assist, +2 for physical assistance Sit to supine: Max assist,  +2 for physical assistance   General bed mobility comments: frequent vcs for technique    Transfers Overall transfer level: Needs assistance Equipment used: Rolling walker (2 wheels) Transfers: Sit to/from Stand Sit to Stand: Mod assist, +2 safety/equipment   Squat pivot transfers: +2 physical assistance, Mod assist, Max assist       General transfer comment: pt is fearful of falling, guided to chair by therapist     Balance Overall balance assessment: Needs assistance Sitting-balance support: Feet supported Sitting balance-Leahy Scale: Fair Sitting balance - Comments: initial MOD A, progress to supervision-CGA with positioning and verbal cueing for technique   Standing balance support: Bilateral upper extremity supported, During functional activity, Reliant on assistive device for balance Standing balance-Leahy Scale: Poor Standing balance comment: STS 3 attempts with RW with MOD A+2                           ADL either performed or assessed with clinical judgement   ADL Overall ADL's : Needs assistance/impaired     Grooming: Wash/dry hands;Wash/dry face;Sitting;Supervision/safety Grooming Details (indicate cue type and reason): intermittent vcs for initiation             Lower Body Dressing: Maximal assistance Lower Body Dressing Details (indicate cue type and reason): socks Toilet Transfer: Maximal assistance;Rolling walker (2 wheels);+2 for physical assistance;+2 for safety/equipment;Moderate assistance Toilet Transfer Details (indicate cue type and reason): simulted to bedside chair with step by step multi modal cues                Extremity/Trunk Assessment              Vision  Perception     Praxis     Communication Communication Communication: No apparent difficulties   Cognition Arousal: Alert Behavior During Therapy: WFL for tasks assessed/performed, Flat affect Cognition: No family/caregiver present to determine  baseline, Cognition impaired   Orientation impairments: Situation   Memory impairment (select all impairments): Short-term memory Attention impairment (select first level of impairment): Sustained attention   OT - Cognition Comments: increased time for simple commpands                 Following commands: Impaired Following commands impaired: Follows one step commands with increased time      Cueing   Cueing Techniques: Verbal cues  Exercises Other Exercises Other Exercises: edu re: role of OT, role of rehab, importance of progressing mobility    Shoulder Instructions       General Comments vss throughout on 2 L via [place]    Pertinent Vitals/ Pain       Pain Assessment Pain Assessment: Faces Faces Pain Scale: Hurts a little bit Pain Location: temple area Pain Descriptors / Indicators: Discomfort Pain Intervention(s): Monitored during session, Repositioned (notified RN)  Home Living                                          Prior Functioning/Environment              Frequency  Min 1X/week        Progress Toward Goals  OT Goals(current goals can now be found in the care plan section)  Progress towards OT goals: Progressing toward goals  Acute Rehab OT Goals Time For Goal Achievement: 04/18/23  Plan      Co-evaluation    PT/OT/SLP Co-Evaluation/Treatment: Yes Reason for Co-Treatment: To address functional/ADL transfers;For patient/therapist safety   OT goals addressed during session: ADL's and self-care;Strengthening/ROM      AM-PAC OT "6 Clicks" Daily Activity     Outcome Measure   Help from another person eating meals?: None Help from another person taking care of personal grooming?: A Little Help from another person toileting, which includes using toliet, bedpan, or urinal?: Total Help from another person bathing (including washing, rinsing, drying)?: A Lot Help from another person to put on and taking off regular upper  body clothing?: A Lot Help from another person to put on and taking off regular lower body clothing?: A Lot 6 Click Score: 14    End of Session Equipment Utilized During Treatment: Rolling walker (2 wheels);Gait belt  OT Visit Diagnosis: Other abnormalities of gait and mobility (R26.89);Muscle weakness (generalized) (M62.81);History of falling (Z91.81);Pain Pain - Right/Left: Left Pain - part of body:  (head)   Activity Tolerance Patient tolerated treatment well   Patient Left with call bell/phone within reach;in chair;with chair alarm set   Nurse Communication Mobility status (reported pain in her temple area)        Time: 1610-9604 OT Time Calculation (min): 23 min  Charges: OT General Charges $OT Visit: 1 Visit OT Treatments $Therapeutic Activity: 8-22 mins  Oleta Mouse, OTD OTR/L  04/07/23, 12:28 PM

## 2023-04-07 NOTE — Plan of Care (Signed)
  Problem: Clinical Measurements: Goal: Ability to maintain clinical measurements within normal limits will improve Outcome: Progressing   Problem: Clinical Measurements: Goal: Will remain free from infection Outcome: Progressing   Problem: Clinical Measurements: Goal: Diagnostic test results will improve Outcome: Progressing   Problem: Nutrition: Goal: Adequate nutrition will be maintained Outcome: Progressing   Problem: Safety: Goal: Ability to remain free from injury will improve Outcome: Progressing

## 2023-04-07 NOTE — NC FL2 (Signed)
 [place] MEDICAID FL2 LEVEL OF CARE FORM     IDENTIFICATION  Patient Name: [name] Birthdate: 1940-06-29 Sex: female Admission Date (Current Location): 04/03/2023  [place] and [place] Number:  Chiropodist and Address:  [facility], [address] [place], [place] [address]      Provider Number: [id number]  Attending Physician Name and Address:  Loyce Dys, MD  Relative Name and Phone Number:       Current Level of Care: Hospital Recommended Level of Care: Skilled Nursing Facility Prior Approval Number:    Date Approved/Denied:   PASRR Number: [id number]  Discharge Plan: SNF    Current Diagnoses: Patient Active Problem List   Diagnosis Date Noted   Acute encephalopathy 04/03/2023   Urinary tract infection 04/03/2023   Chronic HFmrEF w/ recovered EF 04/03/2023   Neck pain 04/03/2023   AKI (acute kidney injury) (HCC) 07/12/2020   Acute kidney injury (HCC) 07/11/2020   Sleep apnea    Chronic atrial fibrillation (HCC)    Elevated troponin    Elevated LFTs    Chronic respiratory failure with hypoxia and hypercapnia (HCC)    Type 2 diabetes mellitus (HCC)    Hypertension associated with diabetes (HCC)    Hyperlipidemia associated with type 2 diabetes mellitus (HCC)    Generalized weakness 04/12/2020   Obesity, Class III, BMI 40-49.9 (morbid obesity) (HCC) 04/12/2020   Obesity, diabetes, and hypertension syndrome (HCC) 04/12/2020   Metabolic syndrome 04/12/2020   Shortness of breath 04/12/2020   Mild aortic valve stenosis 01/24/2020   Overdose of coumadin, accidental or unintentional, initial encounter 07/29/2019   B12 deficiency 11/25/2014   Depression 11/25/2014   Moderate mitral insufficiency 11/23/2014   Memory loss 07/10/2014   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   Piriformis syndrome 06/18/2014   Facet syndrome, lumbar 06/18/2014   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/2014   DDD (degenerative disc disease), lumbar 06/18/2014    DDD (degenerative disc disease), cervical 06/08/2014   DDD (degenerative disc disease), 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   Benign essential hypertension 05/22/2014   Overactive bladder 05/04/2014   RLS (restless legs syndrome) 02/09/2014   Moderate tricuspid insufficiency 02/01/2014   CAD (coronary artery disease) 11/30/2012   Implantable cardioverter-defibrillator (ICD) in situ 11/30/2012   Cervical spinal stenosis 04/09/2011    Orientation RESPIRATION BLADDER Height & Weight     Self, Time, Place  O2 (Nasal Cannula 2 L) Incontinent, External catheter Weight: 239 lb 10.2 oz (108.7 kg) Height:  5\' 3"  (160 cm)  BEHAVIORAL SYMPTOMS/MOOD NEUROLOGICAL BOWEL NUTRITION STATUS   (None)  (None) Incontinent Diet (Regular)  AMBULATORY STATUS COMMUNICATION OF NEEDS Skin   Extensive Assist Verbally Other (Comment) (Erythema/redness.)                       Personal Care Assistance Level of Assistance  Bathing, Feeding, Dressing Bathing Assistance: Maximum assistance Feeding assistance: Limited assistance Dressing Assistance: Maximum assistance     Functional Limitations Info  Sight, Hearing, Speech Sight Info: Adequate Hearing Info: Adequate Speech Info: Adequate    SPECIAL CARE FACTORS FREQUENCY  PT (By licensed PT), OT (By licensed OT)     PT Frequency: 5 x week OT Frequency: 5 x week            Contractures Contractures Info: Not present    Additional Factors Info  Code Status, Allergies Code Status Info: DNR Allergies Info: Ciprofloxacin Hcl, Dopamine, Sulfa Antibiotics, Sulfacetamide Sodium, Tegaderm Ag  Mesh (Silver)           Current Medications (04/07/2023):  This is the current hospital active medication list Current Facility-Administered Medications  Medication Dose Route Frequency Provider Last Rate Last Admin   acetaminophen (TYLENOL) tablet 650 mg  650 mg Oral Q6H PRN Verdene Lennert, MD   650 mg at 04/06/23 1107   Or    acetaminophen (TYLENOL) suppository 650 mg  650 mg Rectal Q6H PRN Verdene Lennert, MD       albuterol (PROVENTIL) (2.5 MG/3ML) 0.083% nebulizer solution 2.5 mg  2.5 mg Inhalation Q4H PRN Verdene Lennert, MD       apixaban (ELIQUIS) tablet 5 mg  5 mg Oral BID Rosezetta Schlatter T, MD   5 mg at 04/07/23 0836   atorvastatin (LIPITOR) tablet 20 mg  20 mg Oral Daily Rosezetta Schlatter T, MD   20 mg at 04/07/23 0837   busPIRone (BUSPAR) tablet 15 mg  15 mg Oral BID Rosezetta Schlatter T, MD   15 mg at 04/07/23 1610   cefadroxil (DURICEF) capsule 500 mg  500 mg Oral BID Rosezetta Schlatter T, MD   500 mg at 04/07/23 0836   donepezil (ARICEPT) tablet 10 mg  10 mg Oral QHS Rosezetta Schlatter T, MD   10 mg at 04/06/23 2107   feeding supplement (ENSURE ENLIVE / ENSURE PLUS) liquid 237 mL  237 mL Oral BID BM Rosezetta Schlatter T, MD   237 mL at 04/07/23 1429   losartan (COZAAR) tablet 50 mg  50 mg Oral Daily Rosezetta Schlatter T, MD   50 mg at 04/07/23 9604   multivitamin with minerals tablet 1 tablet  1 tablet Oral Daily Rosezetta Schlatter T, MD   1 tablet at 04/07/23 0836   ondansetron (ZOFRAN) injection 4 mg  4 mg Intravenous Q6H PRN Verdene Lennert, MD       pantoprazole (PROTONIX) EC tablet 40 mg  40 mg Oral Daily Rosezetta Schlatter T, MD   40 mg at 04/07/23 0837   sertraline (ZOLOFT) tablet 100 mg  100 mg Oral Daily Rosezetta Schlatter T, MD   100 mg at 04/07/23 0836   sodium chloride flush (NS) 0.9 % injection 3 mL  3 mL Intravenous Q12H Verdene Lennert, MD   3 mL at 04/07/23 5409     Discharge Medications: Please see discharge summary for a list of discharge medications.  Relevant Imaging Results:  Relevant Lab Results:   Additional Information SS#: [id number]  Margarito Liner, LCSW

## 2023-04-08 DIAGNOSIS — G9340 Encephalopathy, unspecified: Secondary | ICD-10-CM | POA: Diagnosis not present

## 2023-04-08 LAB — CBC WITH DIFFERENTIAL/PLATELET
Abs Immature Granulocytes: 0.06 10*3/uL (ref 0.00–0.07)
Basophils Absolute: 0 10*3/uL (ref 0.0–0.1)
Basophils Relative: 0 %
Eosinophils Absolute: 0.1 10*3/uL (ref 0.0–0.5)
Eosinophils Relative: 1 %
HCT: 37.8 % (ref 36.0–46.0)
Hemoglobin: 11.9 g/dL — ABNORMAL LOW (ref 12.0–15.0)
Immature Granulocytes: 1 %
Lymphocytes Relative: 15 %
Lymphs Abs: 1.4 10*3/uL (ref 0.7–4.0)
MCH: 27.6 pg (ref 26.0–34.0)
MCHC: 31.5 g/dL (ref 30.0–36.0)
MCV: 87.7 fL (ref 80.0–100.0)
Monocytes Absolute: 0.6 10*3/uL (ref 0.1–1.0)
Monocytes Relative: 7 %
Neutro Abs: 6.9 10*3/uL (ref 1.7–7.7)
Neutrophils Relative %: 76 %
Platelets: 253 10*3/uL (ref 150–400)
RBC: 4.31 MIL/uL (ref 3.87–5.11)
RDW: 15.5 % (ref 11.5–15.5)
WBC: 9.1 10*3/uL (ref 4.0–10.5)
nRBC: 0 % (ref 0.0–0.2)

## 2023-04-08 LAB — BASIC METABOLIC PANEL
Anion gap: 9 (ref 5–15)
BUN: 21 mg/dL (ref 8–23)
CO2: 31 mmol/L (ref 22–32)
Calcium: 9.1 mg/dL (ref 8.9–10.3)
Chloride: 97 mmol/L — ABNORMAL LOW (ref 98–111)
Creatinine, Ser: 0.57 mg/dL (ref 0.44–1.00)
GFR, Estimated: >60 mL/min (ref >60)
Glucose, Bld: 163 mg/dL — ABNORMAL HIGH (ref 70–99)
Potassium: 3.3 mmol/L — ABNORMAL LOW (ref 3.5–5.1)
Sodium: 137 mmol/L (ref 135–145)

## 2023-04-08 LAB — GLUCOSE, CAPILLARY
Glucose-Capillary: 155 mg/dL — ABNORMAL HIGH (ref 70–99)
Glucose-Capillary: 168 mg/dL — ABNORMAL HIGH (ref 70–99)
Glucose-Capillary: 195 mg/dL — ABNORMAL HIGH (ref 70–99)

## 2023-04-08 LAB — CULTURE, BLOOD (ROUTINE X 2)
Culture: NO GROWTH
Culture: NO GROWTH

## 2023-04-08 MED ORDER — ADULT MULTIVITAMIN W/MINERALS CH: 1.0000 | ORAL_TABLET | Freq: Every day | ORAL | Status: AC

## 2023-04-08 MED ORDER — POTASSIUM CHLORIDE CRYS ER 20 MEQ PO TBCR
40.0000 meq | EXTENDED_RELEASE_TABLET | Freq: Once | ORAL | Status: AC
  Administered 2023-04-08: 40 meq via ORAL
  Filled 2023-04-08: qty 2

## 2023-04-08 MED ORDER — ONDANSETRON HCL 4 MG/2ML IJ SOLN: 4.0000 mg | Freq: Four times a day (QID) | INTRAMUSCULAR | Status: AC | PRN

## 2023-04-08 MED ORDER — FUROSEMIDE 20 MG PO TABS: 20.0000 mg | ORAL_TABLET | Freq: Every day | ORAL | Status: AC | PRN

## 2023-04-08 NOTE — TOC Progression Note (Signed)
 Transition of Care [facility]) - Progression Note    Patient Details  Name: OLUWATOMISIN HUSTEAD MRN: [id number] Date of Birth: [birth year]  [facility] CM/SW Contact  Truddie Hidden, RN Phone Number: 04/08/2023, 10:50 AM  Clinical Narrative:    Patient approved for [facility] [id number]  #[id number] 3/4-3/6  Spoke with Clide Cliff, Admissions coordinator at [facility]. Patient is approved to come today. MD notified.     Expected Discharge Plan: Skilled Nursing Facility Barriers to Discharge: Continued Medical Work up  Expected Discharge Plan and Services     Post Acute Care Choice: Skilled Nursing Facility Living arrangements for the past 2 months: Skilled Nursing Facility                                       Social Determinants of Health (SDOH) Interventions SDOH Screenings   Food Insecurity: No Food Insecurity (04/04/2023)  Housing: Low Risk  (04/04/2023)  Transportation Needs: No Transportation Needs (04/04/2023)  Utilities: Not At Risk (04/04/2023)  Social Connections: Moderately Isolated (04/04/2023)  Tobacco Use: Medium Risk (02/03/2023)    Readmission Risk Interventions     No data to display

## 2023-04-08 NOTE — Plan of Care (Signed)
  Problem: Clinical Measurements: Goal: Will remain free from infection Outcome: Progressing   Problem: Clinical Measurements: Goal: Cardiovascular complication will be avoided Outcome: Progressing   Problem: Activity: Goal: Risk for activity intolerance will decrease Outcome: Progressing   Problem: Coping: Goal: Level of anxiety will decrease Outcome: Progressing   Problem: Safety: Goal: Ability to remain free from injury will improve Outcome: Progressing

## 2023-04-08 NOTE — Discharge Summary (Signed)
 Physician Discharge Summary   Patient: [name] MRN: [id number]  DOB: Jun 18, 1940   Admit:     Date of Admission: 04/03/2023 Admitted from: SNF   Discharge: Date of discharge: 04/08/23 Disposition: Skilled nursing facility Condition at discharge: good  CODE STATUS: DNR - signed goldenrod form on chart at time of discharge      Discharge Physician: Sunnie Nielsen, DO [facility]     PCP: Gracelyn Nurse, MD  Recommendations for Outpatient Follow-up:  Follow up with PCP Gracelyn Nurse, MD in 1-2 week    Discharge Instructions     (HEART FAILURE PATIENTS) Call MD:  Anytime you have any of the following symptoms: 1) 3 pound weight gain in 24 hours or 5 pounds in 1 week 2) shortness of breath, with or without a dry hacking cough 3) swelling in the hands, feet or stomach 4) if you have to sleep on extra pillows at night in order to breathe.   Complete by: As directed    Diet - low sodium heart healthy   Complete by: As directed    Increase activity slowly   Complete by: As directed          Discharge Diagnoses: Principal Problem:   Acute encephalopathy Active Problems:   Urinary tract infection   Neck pain   Chronic respiratory failure with hypoxia and hypercapnia (HCC)   Sleep apnea   Chronic atrial fibrillation (HCC)   Type 2 diabetes mellitus (HCC)   Hypertension associated with diabetes (HCC)   Depression   RLS (restless legs syndrome)   Chronic HFmrEF w/ recovered EF        Hospital course / significant events:  [name] is a 83 y.o. 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  Patient is a resident at [facility] facility. In ED, Urinalysis with  large hematuria, moderate leukocytes, many bacteria. CT head obtained with no acute abnormality. Chest x-ray obtained with vascular congestion but no overt pulmonary edema. CT soft tissue of the neck and CT renal stone study ordered and pending. Patient started on Rocephin and IV fluids. Admitted to hospitalist service. UCx (+)Ecoli, pansensitive. Stabilized and awaiting rehab placement at [facility] pending insurance - Berkley Harvey was obtained today 04/08/23 and medically stable for discharge       Consultants:  none  Procedures/Surgeries: none      ASSESSMENT & PLAN:   Acute metabolic encephalopathy Patient was sent to the ED from facility due to increasing generalized weakness for 1 week in addition to altered mental status.   Likely secondary to dehydration and UTI Respiratory panel is negative Antibiotics changed to oral to complete 5 days course Urine culture grew pansensitive E. Coli Held some psychoactive medications on admission w/ concern for polypharmacy contributing to encephalopathy - can cautiously restart Abilify, Depakote, gabapentin.     Urinary tract infection management as above, s/p abx    Neck pain-improved Patient was presenting with significant neck pain on presentation with unknown duration.  CT imaging was negative. Continue as needed pain management   Chronic respiratory failure with hypoxia and hypercapnia Patient has a history of chronic respiratory failure on 2 L, with oxygen levels within goal.   Continue O2 Triana 2L/min while  awake  Continue CPAP at night / when asleep     Chronic HFmrEF w/ recovered EF History of heart failure with moderately reduced EF, with recovered EF.  Last echo in May 2024 demonstrating LVEF of 50-55%.  She appears hypovolemic on admission and BNP is less than previous measurements. Continue daily weight Held lasix on admission, can cautiously restart on as-needed instructions   Depression Held some psychoactive medications on  admission w/ concern for polypharmacy contributing to encephalopathy - can cautiously restart Abilify, Depakote.  Have stayed on Sertaline and BuSpar while here    Hypertension associated with diabetes (HCC) Home antihypertensives held on admission - amlodipine and chlorthalidone, BP has been controlled on losartan    Type 2 diabetes mellitus (HCC) Diet controlled type 2 diabetes, with glucose elevated at 153. Monitor glucose level   Chronic atrial fibrillation (HCC) Chronic atrial fibrillation, now with ventricular pacing. Continue Eliquis      Class 3 Morbid Obesity based on BMI: Body mass index is 42.45 kg/m.  Underweight - under 18  overweight - 25 to 29 obese - 30 or more Class 1 obesity: BMI of 30.0 to 34 Class 2 obesity: BMI of 35.0 to 39 Class 3 obesity: BMI of 40.0 to 49 Super Morbid Obesity: BMI 50-59 Super-super Morbid Obesity: BMI 60+ Significantly low or high BMI is associated with higher medical risk.  Weight management advised as adjunct to other disease management and risk reduction treatments             Discharge Instructions  Allergies as of 04/08/2023       Reactions   Ciprofloxacin Hcl Itching   Rash and itching all over stomach and arms   Dopamine Nausea And Vomiting   Sulfa Antibiotics Rash   Sulfacetamide Sodium Rash   Tegaderm Ag Mesh [silver] Other (See Comments), Rash   Blisters and takes skin off        Medication List     STOP taking these medications    amLODipine 5 MG tablet Commonly known as: NORVASC   chlorthalidone 25 MG tablet Commonly known as: HYGROTON   divalproex 250 MG DR tablet Commonly known as: DEPAKOTE       TAKE these medications    albuterol 108 (90 Base) MCG/ACT inhaler Commonly known as: VENTOLIN HFA Inhale 2 puffs into the lungs every 4 (four) hours as needed for wheezing or shortness of breath.   alendronate 70 MG tablet Commonly known as: FOSAMAX Take 70 mg by mouth every Monday.    ARIPiprazole 5 MG tablet Commonly known as: ABILIFY Take 5 mg by mouth daily.   atorvastatin 20 MG tablet Commonly known as: LIPITOR Take 20 mg by mouth daily.   busPIRone 15 MG tablet Commonly known as: BUSPAR Take 15 mg by mouth 2 (two) times daily.   Calcium Carb-Cholecalciferol 600-10 MG-MCG Caps Take 400 Units by mouth 2 (two) times daily.   donepezil 10 MG tablet Commonly known as: ARICEPT Take 10 mg by mouth at bedtime.   Eliquis 5 MG Tabs tablet Generic drug: apixaban Take 5 mg by mouth 2 (two) times daily.   furosemide 20 MG tablet Commonly known as: LASIX Take 1 tablet (20 mg total) by mouth daily as needed for edema or fluid. Take as needed for up to 3 days for increased leg swelling, shortness of breath, weight gain 5+ lbs over 1-2 days. Seek medical care if these symptoms are not improving with increased dose. What changed:  how much to take  how to take this when to take this reasons to take this additional instructions   gabapentin 300 MG capsule Commonly known as: NEURONTIN Take 300 mg by mouth every evening.   gabapentin 100 MG capsule Commonly known as: NEURONTIN Take 200 mg by mouth 2 (two) times daily.   loratadine 10 MG tablet Commonly known as: CLARITIN Take 10 mg by mouth daily.   losartan 50 MG tablet Commonly known as: COZAAR Take 50 mg by mouth daily.   multivitamin with minerals Tabs tablet Take 1 tablet by mouth daily. Start taking on: April 09, 2023   omeprazole 20 MG capsule Commonly known as: PRILOSEC Take 20 mg by mouth daily.   ondansetron 4 MG/2ML Soln injection Commonly known as: ZOFRAN Inject 2 mLs (4 mg total) into the vein every 6 (six) hours as needed for nausea.   sertraline 100 MG tablet Commonly known as: ZOLOFT Take 100 mg by mouth daily.         Contact information for after-discharge care     Destination     [facility] .   Service: Skilled Nursing Contact  information: [address] [place] [address] [phone]                     Allergies  Allergen Reactions   Ciprofloxacin Hcl Itching    Rash and itching all over stomach and arms   Dopamine Nausea And Vomiting   Sulfa Antibiotics Rash   Sulfacetamide Sodium Rash   Tegaderm Ag Mesh [Silver] Other (See Comments) and Rash    Blisters and takes skin off     Subjective: pt states no concerns today, denies CP, SOB, N/V, HA/VC, pain. She is tolerating diet. Denies dysuria.    Discharge Exam: BP 123/62 (BP Location: Right Arm)   Pulse 70   Temp 98.4 F (36.9 C)   Resp 20   Ht 5\' 3"  (1.6 m)   Wt 108.7 kg   SpO2 94%   BMI 42.45 kg/m  General: Pt is alert, awake, not in acute distress Cardiovascular: RRR, S1/S2 +, no rubs, no gallops Respiratory: CTA bilaterally, no wheezing, no rhonchi Abdominal: Soft, NT, ND, bowel sounds + Extremities: no edema, no cyanosis     The results of significant diagnostics from this hospitalization (including imaging, microbiology, ancillary and laboratory) are listed below for reference.     Microbiology: Recent Results (from the past 240 hours)  Blood Culture (routine x 2)     Status: None   Collection Time: 04/03/23 12:59 PM   Specimen: BLOOD  Result Value Ref Range Status   Specimen Description BLOOD LEFT ARM  Final   Special Requests   Final    BOTTLES DRAWN AEROBIC AND ANAEROBIC Blood Culture results may not be optimal due to an inadequate volume of blood received in culture bottles   Culture   Final    NO GROWTH 5 DAYS Performed at [facility], [address] [place], [place] [address]    Report Status 04/08/2023 FINAL  Final  Group A Strep by PCR ([facility] Only)     Status: None   Collection Time: 04/03/23 12:59 PM   Specimen: Throat; Sterile Swab  Result Value Ref Range Status   Group A Strep by PCR NOT DETECTED NOT DETECTED Final    Comment: Performed at [facility], [address] [place], [place] [address]  Resp panel by RT-PCR (RSV, Flu A&B, Covid) Anterior Nasal  Swab     Status: None   Collection Time: 04/03/23 12:59 PM   Specimen: Anterior Nasal Swab  Result Value Ref Range Status   SARS Coronavirus 2 by RT PCR NEGATIVE NEGATIVE Final    Comment: (NOTE) SARS-CoV-2 target nucleic acids are NOT DETECTED.  The SARS-CoV-2 RNA is generally detectable in upper respiratory specimens during the acute phase of infection. The lowest concentration of SARS-CoV-2 viral copies this assay can detect is 138 copies/mL. A negative result does not preclude SARS-Cov-2 infection and should not be used as the sole basis for treatment or other patient management decisions. A negative result may occur with  improper specimen collection/handling, submission of specimen other than nasopharyngeal swab, presence of viral mutation(s) within the areas targeted by this assay, and inadequate number of viral copies(<138 copies/mL). A negative result must be combined with clinical observations, patient history, and epidemiological information. The expected result is Negative.  Fact Sheet for Patients:  BloggerCourse.com  Fact Sheet for Healthcare Providers:  SeriousBroker.it  This test is no t yet approved or cleared by the [place] FDA and  has been authorized for detection and/or diagnosis of SARS-CoV-2 by FDA under an Emergency Use Authorization (EUA). This EUA will remain  in effect (meaning this test can be used) for the duration of the COVID-19 declaration under Section 564(b)(1) of the Act, 21 U.S.C.section 360bbb-3(b)(1), unless the authorization is terminated  or revoked sooner.       Influenza A by PCR NEGATIVE NEGATIVE Final   Influenza B by PCR NEGATIVE NEGATIVE Final    Comment: (NOTE) The Xpert Xpress SARS-CoV-2/FLU/RSV plus assay is intended as an aid in the diagnosis of influenza from Nasopharyngeal swab  specimens and should not be used as a sole basis for treatment. Nasal washings and aspirates are unacceptable for Xpert Xpress SARS-CoV-2/FLU/RSV testing.  Fact Sheet for Patients: BloggerCourse.com  Fact Sheet for Healthcare Providers: SeriousBroker.it  This test is not yet approved or cleared by the [place] FDA and has been authorized for detection and/or diagnosis of SARS-CoV-2 by FDA under an Emergency Use Authorization (EUA). This EUA will remain in effect (meaning this test can be used) for the duration of the COVID-19 declaration under Section 564(b)(1) of the Act, 21 U.S.C. section 360bbb-3(b)(1), unless the authorization is terminated or revoked.     Resp Syncytial Virus by PCR NEGATIVE NEGATIVE Final    Comment: (NOTE) Fact Sheet for Patients: BloggerCourse.com  Fact Sheet for Healthcare Providers: SeriousBroker.it  This test is not yet approved or cleared by the [place] FDA and has been authorized for detection and/or diagnosis of SARS-CoV-2 by FDA under an Emergency Use Authorization (EUA). This EUA will remain in effect (meaning this test can be used) for the duration of the COVID-19 declaration under Section 564(b)(1) of the Act, 21 U.S.C. section 360bbb-3(b)(1), unless the authorization is terminated or revoked.  Performed at [facility] [address] [place], [place] [address]   Blood Culture (routine x 2)     Status: None   Collection Time: 04/03/23  1:01 PM   Specimen: BLOOD  Result Value Ref Range Status   Specimen Description BLOOD RIGHT ARM  Final   Special Requests   Final    BOTTLES DRAWN AEROBIC AND ANAEROBIC Blood Culture results may not be optimal due to an inadequate volume of blood received in culture bottles   Culture   Final    NO GROWTH 5 DAYS Performed at [facility], [address] [place], [place]  [address]    Report Status 04/08/2023 FINAL  Final  Urine Culture     Status: Abnormal   Collection Time: 04/03/23  1:01 PM   Specimen: Urine, Random  Result Value Ref Range Status   Specimen Description   Final    URINE, RANDOM Performed at [facility], [address] [place], [place] [address]    Special Requests   Final    NONE Reflexed from [id number] Performed at [facility], [address] [place], [place] [address]    Culture >=100,000 COLONIES/mL ESCHERICHIA COLI (A)  Final   Report Status 04/05/2023 FINAL  Final   Organism ID, Bacteria ESCHERICHIA COLI (A)  Final      Susceptibility   Escherichia coli - MIC*    AMPICILLIN 4 SENSITIVE Sensitive     CEFAZOLIN <=4 SENSITIVE Sensitive     CEFEPIME <=0.12 SENSITIVE Sensitive     CEFTRIAXONE <=0.25 SENSITIVE Sensitive     CIPROFLOXACIN <=0.25 SENSITIVE Sensitive     GENTAMICIN <=1 SENSITIVE Sensitive     IMIPENEM <=0.25 SENSITIVE Sensitive     NITROFURANTOIN <=16 SENSITIVE Sensitive     TRIMETH/SULFA <=20 SENSITIVE Sensitive     AMPICILLIN/SULBACTAM <=2 SENSITIVE Sensitive     PIP/TAZO <=4 SENSITIVE Sensitive ug/mL    * >=100,000 COLONIES/mL ESCHERICHIA COLI  Respiratory (~20 pathogens) panel by PCR     Status: None   Collection Time: 04/03/23  5:04 PM   Specimen: Nasopharyngeal Swab; Respiratory  Result Value Ref Range Status   Adenovirus NOT DETECTED NOT DETECTED Final   Coronavirus 229E NOT DETECTED NOT DETECTED Final    Comment: (NOTE) The Coronavirus on the Respiratory Panel, DOES NOT test for the novel  Coronavirus (2019 nCoV)    Coronavirus HKU1 NOT DETECTED NOT DETECTED Final   Coronavirus NL63 NOT DETECTED NOT DETECTED Final   Coronavirus OC43 NOT DETECTED NOT DETECTED Final   Metapneumovirus NOT DETECTED NOT DETECTED Final   Rhinovirus / Enterovirus NOT DETECTED NOT DETECTED Final   Influenza A NOT DETECTED NOT DETECTED Final   Influenza B NOT DETECTED NOT DETECTED Final   Parainfluenza  Virus 1 NOT DETECTED NOT DETECTED Final   Parainfluenza Virus 2 NOT DETECTED NOT DETECTED Final   Parainfluenza Virus 3 NOT DETECTED NOT DETECTED Final   Parainfluenza Virus 4 NOT DETECTED NOT DETECTED Final   Respiratory Syncytial Virus NOT DETECTED NOT DETECTED Final   Bordetella pertussis NOT DETECTED NOT DETECTED Final   Bordetella Parapertussis NOT DETECTED NOT DETECTED Final   Chlamydophila pneumoniae NOT DETECTED NOT DETECTED Final   Mycoplasma pneumoniae NOT DETECTED NOT DETECTED Final    Comment: Performed at [facility], [address], [place], [place] [address]     Labs: BNP (last 3 results) Recent Labs    04/03/23 1259  BNP 105.7*   Basic Metabolic Panel: Recent Labs  Lab 04/03/23 1259 04/04/23 0248 04/05/23 0523 04/06/23 0528 04/07/23 0417 04/08/23 0430  NA 138 140 138 139 139 137  K 3.4* 3.0* 3.4* 3.8 3.6 3.3*  CL 88* 93* 95* 99 97* 97*  CO2 37* 36* 33* 32 31 31  GLUCOSE 153* 125* 161* 170* 169* 163*  BUN 28* 21 21 21  26* 21  CREATININE 0.67 0.47 0.49 0.60 0.64 0.57  CALCIUM 9.0 8.6* 9.2 9.6 9.4 9.1  MG 2.1  --   --   --   --   --    Liver Function Tests: Recent Labs  Lab  04/03/23 1259 04/04/23 0248  AST 20 13*  ALT 8 8  ALKPHOS 49 46  BILITOT 1.1 1.0  PROT 7.4 6.7  ALBUMIN 3.2* 2.8*   No results for input(s): "LIPASE", "AMYLASE" in the last 168 hours. No results for input(s): "AMMONIA" in the last 168 hours. CBC: Recent Labs  Lab 04/04/23 0248 04/05/23 0523 04/06/23 0528 04/07/23 0417 04/08/23 0430  WBC 8.8 8.8 9.7 9.1 9.1  NEUTROABS 6.5 6.9 7.2 6.7 6.9  HGB 11.7* 11.7* 11.9* 12.0 11.9*  HCT 38.6 37.4 38.4 39.0 37.8  MCV 88.5 87.0 88.1 87.6 87.7  PLT 173 194 230 259 253   Cardiac Enzymes: No results for input(s): "CKTOTAL", "CKMB", "CKMBINDEX", "TROPONINI" in the last 168 hours. BNP: Invalid input(s): "POCBNP" CBG: Recent Labs  Lab 04/07/23 0642 04/07/23 1213 04/07/23 1748 04/08/23 0643 04/08/23 0746  GLUCAP 191*  198* 249* 155* 168*   D-Dimer No results for input(s): "DDIMER" in the last 72 hours. Hgb A1c No results for input(s): "HGBA1C" in the last 72 hours. Lipid Profile No results for input(s): "CHOL", "HDL", "LDLCALC", "TRIG", "CHOLHDL", "LDLDIRECT" in the last 72 hours. Thyroid function studies No results for input(s): "TSH", "T4TOTAL", "T3FREE", "THYROIDAB" in the last 72 hours.  Invalid input(s): "FREET3" Anemia work up No results for input(s): "VITAMINB12", "FOLATE", "FERRITIN", "TIBC", "IRON", "RETICCTPCT" in the last 72 hours. Urinalysis    Component Value Date/Time   COLORURINE YELLOW (A) 04/03/2023 1301   APPEARANCEUR CLOUDY (A) 04/03/2023 1301   APPEARANCEUR Cloudy (A) 02/17/2018 1247   LABSPEC 1.020 04/03/2023 1301   LABSPEC 1.012 12/07/2012 1209   PHURINE 7.0 04/03/2023 1301   GLUCOSEU NEGATIVE 04/03/2023 1301   GLUCOSEU Negative 12/07/2012 1209   HGBUR LARGE (A) 04/03/2023 1301   BILIRUBINUR NEGATIVE 04/03/2023 1301   BILIRUBINUR Negative 02/17/2018 1247   BILIRUBINUR Negative 12/07/2012 1209   KETONESUR NEGATIVE 04/03/2023 1301   PROTEINUR 100 (A) 04/03/2023 1301   NITRITE NEGATIVE 04/03/2023 1301   LEUKOCYTESUR MODERATE (A) 04/03/2023 1301   LEUKOCYTESUR 1+ 12/07/2012 1209   Sepsis Labs Recent Labs  Lab 04/05/23 0523 04/06/23 0528 04/07/23 0417 04/08/23 0430  WBC 8.8 9.7 9.1 9.1   Microbiology Recent Results (from the past 240 hours)  Blood Culture (routine x 2)     Status: None   Collection Time: 04/03/23 12:59 PM   Specimen: BLOOD  Result Value Ref Range Status   Specimen Description BLOOD LEFT ARM  Final   Special Requests   Final    BOTTLES DRAWN AEROBIC AND ANAEROBIC Blood Culture results may not be optimal due to an inadequate volume of blood received in culture bottles   Culture   Final    NO GROWTH 5 DAYS Performed at [facility], [address] [place], [place] [address]    Report Status 04/08/2023 FINAL  Final  Group A Strep by  PCR ([facility] Only)     Status: None   Collection Time: 04/03/23 12:59 PM   Specimen: Throat; Sterile Swab  Result Value Ref Range Status   Group A Strep by PCR NOT DETECTED NOT DETECTED Final    Comment: Performed at [facility], [address] [place], [place] [address]  Resp panel by RT-PCR (RSV, Flu A&B, Covid) Anterior Nasal Swab     Status: None   Collection Time: 04/03/23 12:59 PM   Specimen: Anterior Nasal Swab  Result Value Ref Range Status   SARS Coronavirus 2 by RT PCR NEGATIVE NEGATIVE Final    Comment: (NOTE) SARS-CoV-2  target nucleic acids are NOT DETECTED.  The SARS-CoV-2 RNA is generally detectable in upper respiratory specimens during the acute phase of infection. The lowest concentration of SARS-CoV-2 viral copies this assay can detect is 138 copies/mL. A negative result does not preclude SARS-Cov-2 infection and should not be used as the sole basis for treatment or other patient management decisions. A negative result may occur with  improper specimen collection/handling, submission of specimen other than nasopharyngeal swab, presence of viral mutation(s) within the areas targeted by this assay, and inadequate number of viral copies(<138 copies/mL). A negative result must be combined with clinical observations, patient history, and epidemiological information. The expected result is Negative.  Fact Sheet for Patients:  BloggerCourse.com  Fact Sheet for Healthcare Providers:  SeriousBroker.it  This test is no t yet approved or cleared by the [place] FDA and  has been authorized for detection and/or diagnosis of SARS-CoV-2 by FDA under an Emergency Use Authorization (EUA). This EUA will remain  in effect (meaning this test can be used) for the duration of the COVID-19 declaration under Section 564(b)(1) of the Act, 21 U.S.C.section 360bbb-3(b)(1), unless the authorization is terminated  or revoked  sooner.       Influenza A by PCR NEGATIVE NEGATIVE Final   Influenza B by PCR NEGATIVE NEGATIVE Final    Comment: (NOTE) The Xpert Xpress SARS-CoV-2/FLU/RSV plus assay is intended as an aid in the diagnosis of influenza from Nasopharyngeal swab specimens and should not be used as a sole basis for treatment. Nasal washings and aspirates are unacceptable for Xpert Xpress SARS-CoV-2/FLU/RSV testing.  Fact Sheet for Patients: BloggerCourse.com  Fact Sheet for Healthcare Providers: SeriousBroker.it  This test is not yet approved or cleared by the [place] FDA and has been authorized for detection and/or diagnosis of SARS-CoV-2 by FDA under an Emergency Use Authorization (EUA). This EUA will remain in effect (meaning this test can be used) for the duration of the COVID-19 declaration under Section 564(b)(1) of the Act, 21 U.S.C. section 360bbb-3(b)(1), unless the authorization is terminated or revoked.     Resp Syncytial Virus by PCR NEGATIVE NEGATIVE Final    Comment: (NOTE) Fact Sheet for Patients: BloggerCourse.com  Fact Sheet for Healthcare Providers: SeriousBroker.it  This test is not yet approved or cleared by the [place] FDA and has been authorized for detection and/or diagnosis of SARS-CoV-2 by FDA under an Emergency Use Authorization (EUA). This EUA will remain in effect (meaning this test can be used) for the duration of the COVID-19 declaration under Section 564(b)(1) of the Act, 21 U.S.C. section 360bbb-3(b)(1), unless the authorization is terminated or revoked.  Performed at [facility], [address] [place], [place] [address]   Blood Culture (routine x 2)     Status: None   Collection Time: 04/03/23  1:01 PM   Specimen: BLOOD  Result Value Ref Range Status   Specimen Description BLOOD RIGHT ARM  Final   Special Requests   Final     BOTTLES DRAWN AEROBIC AND ANAEROBIC Blood Culture results may not be optimal due to an inadequate volume of blood received in culture bottles   Culture   Final    NO GROWTH 5 DAYS Performed at [facility], [address], [place], [place] [address]    Report Status 04/08/2023 FINAL  Final  Urine Culture     Status: Abnormal   Collection Time: 04/03/23  1:01 PM   Specimen: Urine, Random  Result Value Ref Range Status  Specimen Description   Final    URINE, RANDOM Performed at [facility], [address] [place], [place] [address]    Special Requests   Final    NONE Reflexed from [id number] Performed at [facility], [address] [place], [place] [address]    Culture >=100,000 COLONIES/mL ESCHERICHIA COLI (A)  Final   Report Status 04/05/2023 FINAL  Final   Organism ID, Bacteria ESCHERICHIA COLI (A)  Final      Susceptibility   Escherichia coli - MIC*    AMPICILLIN 4 SENSITIVE Sensitive     CEFAZOLIN <=4 SENSITIVE Sensitive     CEFEPIME <=0.12 SENSITIVE Sensitive     CEFTRIAXONE <=0.25 SENSITIVE Sensitive     CIPROFLOXACIN <=0.25 SENSITIVE Sensitive     GENTAMICIN <=1 SENSITIVE Sensitive     IMIPENEM <=0.25 SENSITIVE Sensitive     NITROFURANTOIN <=16 SENSITIVE Sensitive     TRIMETH/SULFA <=20 SENSITIVE Sensitive     AMPICILLIN/SULBACTAM <=2 SENSITIVE Sensitive     PIP/TAZO <=4 SENSITIVE Sensitive ug/mL    * >=100,000 COLONIES/mL ESCHERICHIA COLI  Respiratory (~20 pathogens) panel by PCR     Status: None   Collection Time: 04/03/23  5:04 PM   Specimen: Nasopharyngeal Swab; Respiratory  Result Value Ref Range Status   Adenovirus NOT DETECTED NOT DETECTED Final   Coronavirus 229E NOT DETECTED NOT DETECTED Final    Comment: (NOTE) The Coronavirus on the Respiratory Panel, DOES NOT test for the novel  Coronavirus (2019 nCoV)    Coronavirus HKU1 NOT DETECTED NOT DETECTED Final   Coronavirus NL63 NOT DETECTED NOT DETECTED Final   Coronavirus OC43  NOT DETECTED NOT DETECTED Final   Metapneumovirus NOT DETECTED NOT DETECTED Final   Rhinovirus / Enterovirus NOT DETECTED NOT DETECTED Final   Influenza A NOT DETECTED NOT DETECTED Final   Influenza B NOT DETECTED NOT DETECTED Final   Parainfluenza Virus 1 NOT DETECTED NOT DETECTED Final   Parainfluenza Virus 2 NOT DETECTED NOT DETECTED Final   Parainfluenza Virus 3 NOT DETECTED NOT DETECTED Final   Parainfluenza Virus 4 NOT DETECTED NOT DETECTED Final   Respiratory Syncytial Virus NOT DETECTED NOT DETECTED Final   Bordetella pertussis NOT DETECTED NOT DETECTED Final   Bordetella Parapertussis NOT DETECTED NOT DETECTED Final   Chlamydophila pneumoniae NOT DETECTED NOT DETECTED Final   Mycoplasma pneumoniae NOT DETECTED NOT DETECTED Final    Comment: Performed at [facility], [address], [place], [place] [address]   Imaging CT Renal Stone Study Result Date: 04/03/2023 CLINICAL DATA:  Altered mental status. EXAM: CT ABDOMEN AND PELVIS WITHOUT CONTRAST TECHNIQUE: Multidetector CT imaging of the abdomen and pelvis was performed following the standard protocol without IV contrast. RADIATION DOSE REDUCTION: This exam was performed according to the departmental dose-optimization program which includes automated exposure control, adjustment of the mA and/or kV according to patient size and/or use of iterative reconstruction technique. COMPARISON:  CT abdomen pelvis dated July 11, 2020. FINDINGS: Lower chest: No acute abnormality. Chronic cardiomegaly. Subsegmental atelectasis at both lung bases. Hepatobiliary: No focal liver abnormality is seen, other than unchanged punctate calcified granulomas. Status post cholecystectomy. No biliary dilatation. Pancreas: Unremarkable. No pancreatic ductal dilatation or surrounding inflammatory changes. Spleen: Normal in size without focal abnormality. Punctate calcified granulomas again noted. Adrenals/Urinary Tract: The adrenal glands are unremarkable.  Multiple left renal simple cysts again noted. No follow-up imaging is recommended. Unchanged punctate calculus in the midpole of the right kidney. No ureteral calculi or hydronephrosis. The  bladder is unremarkable. Stomach/Bowel: Stomach is within normal limits. Appendix appears normal. No evidence of bowel wall thickening, distention, or inflammatory changes. Left-sided colonic diverticulosis. Vascular/Lymphatic: Aortic atherosclerosis. No enlarged abdominal or pelvic lymph nodes. Reproductive: Uterus and bilateral adnexa are unremarkable. Other: No free fluid or pneumoperitoneum. Musculoskeletal: No acute or significant osseous findings. IMPRESSION: 1. No acute intra-abdominal process. 2. Unchanged punctate nonobstructive right nephrolithiasis. 3.  Aortic Atherosclerosis (ICD10-I70.0). Electronically Signed   By: Obie Dredge M.D.   On: 04/03/2023 16:50   CT Soft Tissue Neck W Contrast Result Date: 04/03/2023 CLINICAL DATA:  Sore throat.  Altered mental status. EXAM: CT NECK WITH CONTRAST TECHNIQUE: Multidetector CT imaging of the neck was performed using the standard protocol following the bolus administration of intravenous contrast. RADIATION DOSE REDUCTION: This exam was performed according to the departmental dose-optimization program which includes automated exposure control, adjustment of the mA and/or kV according to patient size and/or use of iterative reconstruction technique. CONTRAST:  75mL OMNIPAQUE IOHEXOL 300 MG/ML  SOLN COMPARISON:  None Available. FINDINGS: Pharynx and larynx: No focal mucosal or submucosal lesions are present. Nasopharynx is clear. Soft palate and tongue base are within normal limits. Posterior oropharynx is unremarkable. Palatine tonsils are within normal limits. Vallecula and epiglottis are within normal limits. Aryepiglottic folds and piriform sinuses are clear. Vocal cords are midline and symmetric. Trachea is clear. Salivary glands: The submandibular and parotid glands  and ducts are within normal limits. Thyroid: Normal Lymph nodes: No significant adenopathy is present. Vascular: Atherosclerotic calcifications are present the carotid bifurcations bilaterally. No focal stenosis is present. Limited intracranial: Within normal limits. Visualized orbits: Bilateral lens replacements are noted. Globes and orbits are otherwise unremarkable. Mastoids and visualized paranasal sinuses: Mild mucosal thickening is present the left maxillary sinus. Skeleton: Vertebral body heights and alignment are normal. Solid fusion present at C5-6. No focal osseous lesions are present. Upper chest: Dependent atelectasis is present bilaterally. The lungs are otherwise clear. No significant pleural effusion or pneumothorax is present. Atherosclerotic changes are present at the aortic arch. IMPRESSION: 1. No acute or focal lesion to explain the patient's symptoms. 2. Solid fusion at C5-6. 3. Mild left maxillary sinus disease. 4.  Aortic Atherosclerosis (ICD10-I70.0). Electronically Signed   By: Marin Roberts M.D.   On: 04/03/2023 16:48   CT HEAD WO CONTRAST ( ) Result Date: 04/03/2023 CLINICAL DATA:  Headache, new onset. Weakness for 1 week with progression today. EXAM: CT HEAD WITHOUT CONTRAST TECHNIQUE: Contiguous axial images were obtained from the base of the skull through the vertex without intravenous contrast. RADIATION DOSE REDUCTION: This exam was performed according to the departmental dose-optimization program which includes automated exposure control, adjustment of the mA and/or kV according to patient size and/or use of iterative reconstruction technique. COMPARISON:  CT head without contrast 07/11/2020. FINDINGS: Brain: No acute infarct, hemorrhage, or mass lesion is present. Moderate diffuse white matter changes demonstrates some progression. Basal ganglia are intact. Insular ribbon is normal. The brainstem and cerebellum are within normal limits. Enlarged empty sella is again  noted. Vascular: Atherosclerotic calcifications are present within the cavernous internal carotid arteries and at the dural margin of both vertebral arteries. No hyperdense vessel is present. Skull: Calvarium is intact. No focal lytic or blastic lesions are present. No significant extracranial soft tissue lesion is present. Sinuses/Orbits: Bilateral lens replacements are noted. Globes and orbits are otherwise unremarkable. The globes and orbits are within normal limits. IMPRESSION: 1. No acute intracranial abnormality or significant interval change. 2. Moderate diffuse  white matter changes demonstrate some progression. This likely reflects the sequela of chronic microvascular ischemia. 3. Enlarged empty sella. This is nonspecific, but can be seen in the setting of idiopathic intracranial hypertension. Electronically Signed   By: Marin Roberts M.D.   On: 04/03/2023 14:53   DG Chest Port 1 View Result Date: 04/03/2023 CLINICAL DATA:  Questionable sepsis.  Weakness for 1 week. EXAM: PORTABLE CHEST 1 VIEW COMPARISON:  Radiographs 11/02/2020 and 07/11/2020.  CT 10/25/2019. FINDINGS: 1306 hours. Mild patient rotation to the left. Grossly stable cardiomegaly, aortic atherosclerosis and central enlargement of the pulmonary arteries. Left subclavian AICD leads appear unchanged. There is vascular congestion without overt pulmonary edema. No confluent airspace disease, pneumothorax or significant pleural effusion. No acute osseous findings are seen. IMPRESSION: Cardiomegaly with vascular congestion. No overt pulmonary edema or focal airspace disease. Electronically Signed   By: Carey Bullocks M.D.   On: 04/03/2023 14:24      Time coordinating discharge: over 30 minutes  SIGNED:  Sunnie Nielsen DO [facility]

## 2023-04-08 NOTE — TOC Transition Note (Signed)
 Transition of Care [facility]) - Discharge Note   Patient Details  Name: [name] MRN: [id number] Date of Birth: 1940-02-21  Transition of Care [facility]) CM/SW Contact:  Truddie Hidden, RN Phone Number: 04/08/2023, 11:58 AM   Clinical Narrative:    Sherron Monday with Ricky in admissions at [facility] Per facility patient admission confirmed for today. Patient assigned room # A-13-A Nurse will call report to [phone]  Face sheet and medical necessity forms printed to the floor to be added to the EMS pack EMS arranged  Discharge summary and SNF transfer report sent in HUB.  Nurse, notified of details.  Left a message for patient's friend, [name] TOC signing off.    Final next level of care: Skilled Nursing Facility Barriers to Discharge: Barriers Resolved   Patient Goals and CMS Choice Patient states their goals for this hospitalization and ongoing recovery are:: SNF          Discharge Placement              Patient chooses bed at: Other - please specify in the comment section below: ([facility]) Patient to be transferred to facility by: [facility] Name of family member notified: [name], friend Patient and family notified of of transfer: 04/08/23  Discharge Plan and Services Additional resources added to the After Visit Summary for       Post Acute Care Choice: Skilled Nursing Facility                               Social Drivers of Health (SDOH) Interventions SDOH Screenings   Food Insecurity: No Food Insecurity (04/04/2023)  Housing: Low Risk  (04/04/2023)  Transportation Needs: No Transportation Needs (04/04/2023)  Utilities: Not At Risk (04/04/2023)  Social Connections: Moderately Isolated (04/04/2023)  Tobacco Use: Medium Risk (02/03/2023)     Readmission Risk Interventions     No data to display

## 2023-04-08 NOTE — Hospital Course (Addendum)
 Hospital course / significant events:  [name] is a 83 y.o. 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  Patient is a resident at [facility] facility. In ED, Urinalysis with large hematuria, moderate leukocytes, many bacteria. CT head obtained with no acute abnormality. Chest x-ray obtained with vascular congestion but no overt pulmonary edema. CT soft tissue of the neck and CT renal stone study ordered and pending. Patient started on Rocephin and IV fluids. Admitted to hospitalist service. UCx (+)Ecoli, pansensitive. Stabilized and awaiting rehab placement hopefully at [facility] pending insurance       Consultants:  none  Procedures/Surgeries: none      ASSESSMENT & PLAN:   Acute metabolic encephalopathy Patient was sent to the ED from facility due to increasing generalized weakness for 1 week in addition to altered mental status.   Likely secondary to dehydration and UTI Respiratory panel is negative Antibiotics changed to oral to complete 5 days course Urine culture growing pansensitive E. Coli Held some psychoactive medications on admission w/ concern for polypharmacy contributing to encephalopathy - can cautiously restart Abilify, Depakote.    Urinary tract infection Continue management as above   Neck pain-improved Patient is presenting with significant neck pain on presentation with unknown duration.  CT imaging is negative. Continue as needed pain management   Chronic respiratory failure with hypoxia and hypercapnia (HCC) Patient has a history of chronic respiratory failure on 2 L, with oxygen levels within goal.   Continue CPAP at night     Chronic HFmrEF w/ recovered EF History of  heart failure with moderately reduced EF, with recovered EF.  Last echo in May 2024 demonstrating LVEF of 50-55%.  She appears hypovolemic at this time and BNP is less than previous measurements. Continue daily weight Continue home medications resumed   Depression Held some psychoactive medications on admission w/ concern for polypharmacy contributing to encephalopathy - can cautiously restart Abilify, Depakote, gabapentin .   Continues on    Hypertension associated with diabetes (HCC) - Home antihypertensives resumed   Type 2 diabetes mellitus (HCC) Diet controlled type 2 diabetes, with glucose elevated at 153. Monitor glucose level Continue insulin therapy   Chronic atrial fibrillation (HCC) Chronic atrial fibrillation, now with ventricular pacing. Continue Eliquis      *** based on BMI: Body mass index is 42.45 kg/m.  Underweight - under 18  overweight - 25 to 29 obese - 30 or more Class 1 obesity: BMI of 30.0 to 34 Class 2 obesity: BMI of 35.0 to 39 Class 3 obesity: BMI of 40.0 to 49 Super Morbid Obesity: BMI 50-59 Super-super Morbid Obesity: BMI 60+ Significantly low or high BMI is associated with higher medical risk.  Weight management advised as adjunct to other disease management and risk reduction treatments    DVT prophylaxis: *** IV fluids: *** continuous IV fluids  Nutrition: *** Central lines / invasive devices: ***  Code Status: *** ACP documentation reviewed: *** none on file in [facility]  TOC needs: *** Barriers to dispo / significant pending items: ***

## 2023-06-23 ENCOUNTER — Other Ambulatory Visit: Payer: Self-pay | Admitting: Gastroenterology

## 2023-06-23 DIAGNOSIS — R1310 Dysphagia, unspecified: Secondary | ICD-10-CM

## 2023-07-01 ENCOUNTER — Ambulatory Visit
Admission: RE | Admit: 2023-07-01 | Discharge: 2023-07-01 | Disposition: A | Discharge status: Home / Self Care | Source: Ambulatory Visit | Attending: Gastroenterology | Admitting: Gastroenterology

## 2023-07-01 DIAGNOSIS — R1310 Dysphagia, unspecified: Secondary | ICD-10-CM | POA: Diagnosis present

## 2023-07-24 ENCOUNTER — Encounter: Payer: Self-pay | Admitting: Internal Medicine

## 2023-08-11 ENCOUNTER — Ambulatory Visit: Admitting: Anesthesiology

## 2023-08-11 ENCOUNTER — Ambulatory Visit
Admission: RE | Admit: 2023-08-11 | Discharge: 2023-08-11 | Disposition: A | Discharge status: Home / Self Care | Attending: Internal Medicine | Admitting: Internal Medicine

## 2023-08-11 ENCOUNTER — Encounter: Payer: Self-pay | Admitting: Internal Medicine

## 2023-08-11 ENCOUNTER — Other Ambulatory Visit: Payer: Self-pay

## 2023-08-11 ENCOUNTER — Encounter
Admission: RE | Disposition: A | Discharge status: Home / Self Care | Payer: Self-pay | Source: Home / Self Care | Attending: Internal Medicine

## 2023-08-11 DIAGNOSIS — R933 Abnormal findings on diagnostic imaging of other parts of digestive tract: Secondary | ICD-10-CM | POA: Diagnosis not present

## 2023-08-11 DIAGNOSIS — I509 Heart failure, unspecified: Secondary | ICD-10-CM | POA: Diagnosis not present

## 2023-08-11 DIAGNOSIS — M797 Fibromyalgia: Secondary | ICD-10-CM | POA: Diagnosis not present

## 2023-08-11 DIAGNOSIS — E119 Type 2 diabetes mellitus without complications: Secondary | ICD-10-CM | POA: Insufficient documentation

## 2023-08-11 DIAGNOSIS — Z7901 Long term (current) use of anticoagulants: Secondary | ICD-10-CM | POA: Diagnosis not present

## 2023-08-11 DIAGNOSIS — K2100 Gastro-esophageal reflux disease with esophagitis, without bleeding: Secondary | ICD-10-CM | POA: Insufficient documentation

## 2023-08-11 DIAGNOSIS — G4730 Sleep apnea, unspecified: Secondary | ICD-10-CM | POA: Diagnosis not present

## 2023-08-11 DIAGNOSIS — R1310 Dysphagia, unspecified: Secondary | ICD-10-CM | POA: Insufficient documentation

## 2023-08-11 DIAGNOSIS — I2510 Atherosclerotic heart disease of native coronary artery without angina pectoris: Secondary | ICD-10-CM | POA: Diagnosis not present

## 2023-08-11 DIAGNOSIS — I4891 Unspecified atrial fibrillation: Secondary | ICD-10-CM | POA: Diagnosis not present

## 2023-08-11 DIAGNOSIS — K5900 Constipation, unspecified: Secondary | ICD-10-CM | POA: Insufficient documentation

## 2023-08-11 DIAGNOSIS — I110 Hypertensive heart disease with heart failure: Secondary | ICD-10-CM | POA: Insufficient documentation

## 2023-08-11 DIAGNOSIS — Z87891 Personal history of nicotine dependence: Secondary | ICD-10-CM | POA: Diagnosis not present

## 2023-08-11 DIAGNOSIS — Z95810 Presence of automatic (implantable) cardiac defibrillator: Secondary | ICD-10-CM | POA: Insufficient documentation

## 2023-08-11 DIAGNOSIS — K229 Disease of esophagus, unspecified: Secondary | ICD-10-CM | POA: Insufficient documentation

## 2023-08-11 DIAGNOSIS — K222 Esophageal obstruction: Secondary | ICD-10-CM | POA: Diagnosis not present

## 2023-08-11 HISTORY — DX: Chronic atrial fibrillation, unspecified: I48.20

## 2023-08-11 HISTORY — DX: Deficiency of other specified B group vitamins: E53.8

## 2023-08-11 HISTORY — DX: Other amnesia: R41.3

## 2023-08-11 HISTORY — DX: Unspecified osteoarthritis, unspecified site: M19.90

## 2023-08-11 HISTORY — DX: Rheumatic tricuspid insufficiency: I07.1

## 2023-08-11 HISTORY — DX: Venous insufficiency (chronic) (peripheral): I87.2

## 2023-08-11 HISTORY — PX: ESOPHAGOGASTRODUODENOSCOPY: SHX5428

## 2023-08-11 HISTORY — DX: Depression, unspecified: F32.A

## 2023-08-11 HISTORY — DX: Atherosclerotic heart disease of native coronary artery without angina pectoris: I25.10

## 2023-08-11 HISTORY — DX: Restless legs syndrome: G25.81

## 2023-08-11 HISTORY — DX: Cardiomyopathy, unspecified: I42.9

## 2023-08-11 HISTORY — DX: Multiple fractures of ribs, unspecified side, initial encounter for closed fracture: S22.49XA

## 2023-08-11 HISTORY — DX: Dizziness and giddiness: R42

## 2023-08-11 HISTORY — DX: Solitary pulmonary nodule: R91.1

## 2023-08-11 HISTORY — DX: Spinal stenosis, cervical region: M48.02

## 2023-08-11 HISTORY — DX: Nonrheumatic aortic (valve) stenosis: I35.0

## 2023-08-11 HISTORY — DX: Tremor, unspecified: R25.1

## 2023-08-11 HISTORY — DX: Nonrheumatic mitral (valve) insufficiency: I34.0

## 2023-08-11 LAB — KOH PREP: Special Requests: NORMAL

## 2023-08-11 LAB — GLUCOSE, CAPILLARY: Glucose-Capillary: 115 mg/dL — ABNORMAL HIGH (ref 70–99)

## 2023-08-11 SURGERY — EGD (ESOPHAGOGASTRODUODENOSCOPY)
Anesthesia: General

## 2023-08-11 MED ORDER — LIDOCAINE HCL (PF) 2 % IJ SOLN
INTRAMUSCULAR | Status: AC
  Filled 2023-08-11: qty 5

## 2023-08-11 MED ORDER — PROPOFOL 1000 MG/100ML IV EMUL
INTRAVENOUS | Status: AC
  Filled 2023-08-11: qty 100

## 2023-08-11 MED ORDER — LIDOCAINE HCL (PF) 2 % IJ SOLN
INTRAMUSCULAR | Status: DC | PRN
Start: 2023-08-11 — End: 2023-08-11
  Administered 2023-08-11: 40 mg via INTRADERMAL

## 2023-08-11 MED ORDER — PROPOFOL 10 MG/ML IV BOLUS
INTRAVENOUS | Status: DC | PRN
  Administered 2023-08-11: 40 mg via INTRAVENOUS
  Administered 2023-08-11: 30 mg via INTRAVENOUS
  Administered 2023-08-11 (×2): 20 mg via INTRAVENOUS

## 2023-08-11 MED ORDER — PHENYLEPHRINE HCL (PRESSORS) 10 MG/ML IV SOLN
INTRAVENOUS | Status: DC | PRN
  Administered 2023-08-11: 80 ug via INTRAVENOUS

## 2023-08-11 MED ORDER — SODIUM CHLORIDE 0.9 % IV SOLN: INTRAVENOUS | Status: DC

## 2023-08-11 MED ORDER — EPHEDRINE SULFATE (PRESSORS) 50 MG/ML IJ SOLN
INTRAMUSCULAR | Status: DC | PRN
  Administered 2023-08-11: 10 mg via INTRAVENOUS

## 2023-08-11 NOTE — Anesthesia Postprocedure Evaluation (Signed)
 Anesthesia Post Note  Patient: [name]  Procedure(s) Performed: EGD (ESOPHAGOGASTRODUODENOSCOPY)  Patient location during evaluation: PACU Anesthesia Type: General Level of consciousness: awake and alert, oriented and patient cooperative Pain management: pain level controlled Vital Signs Assessment: post-procedure vital signs reviewed and stable Respiratory status: spontaneous breathing, nonlabored ventilation and respiratory function stable Cardiovascular status: blood pressure returned to baseline and stable Postop Assessment: adequate PO intake Anesthetic complications: no   No notable events documented.   Last Vitals:  Vitals:   08/11/23 1040 08/11/23 1051  BP: (!) 132/42 (!) 157/70  Pulse: 70 69  Resp: 18 (!) 23  Temp:    SpO2: 98% 98%    Last Pain:  Vitals:   08/11/23 1051  TempSrc:   PainSc: 0-No pain                 Alfonso Ruths

## 2023-08-11 NOTE — Brief Op Note (Signed)
 Proximal esophageal brushing, possible Candida, sent to lab

## 2023-08-11 NOTE — Transfer of Care (Signed)
 Immediate Anesthesia Transfer of Care Note  Patient: [name]  Procedure(s) Performed: EGD (ESOPHAGOGASTRODUODENOSCOPY)  Patient Location: PACU and Endoscopy Unit  Anesthesia Type:MAC  Level of Consciousness: drowsy  Airway & Oxygen Therapy: Patient Spontanous Breathing and Patient connected to nasal cannula oxygen  Post-op Assessment: Report given to RN and Post -op Vital signs reviewed and stable  Post vital signs: Reviewed and stable  Last Vitals:  Vitals Value Taken Time  BP 151/53 08/11/23 10:28  Temp    Pulse 70 08/11/23 10:28  Resp 22 08/11/23 10:28  SpO2 96 % 08/11/23 10:28  Vitals shown include unfiled device data.  Last Pain:  Vitals:   08/11/23 0927  TempSrc: Temporal  PainSc: 0-No pain         Complications: No notable events documented.

## 2023-08-11 NOTE — H&P (Signed)
 Outpatient short stay form Pre-procedure 08/11/2023 9:44 AM [name], M.D.  Primary Physician: Sherrian Law, M.D.  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  [name] reports continued to have issues with dysphagia despite her endoscopy with dilation. She feels that foods such as pork, rice will get caught in her throat. She is having regurgitate about twice a week. Does not feel significant symptoms improvement after the dilation. Dysphagia mainly occurs to foods and pills. She denies any liquid dysphagia. Dysphagia always occurs in upper esophageal area. She denies any GERD, nausea, vomiting. Chart review indicates she is taking PPI twice a day which was increased in March 2025. She denies any prior history of of GERD. No epigastric pain. EGD 02/03/2023 - Mild stenosis at distal esophagus, dilated with 54 Fr Maloney tungsten dilator. She does endorse some mild issues with constipation. She feels her stools are slightly firmer than they have been in the past. She is not having alternating diarrhea. She denies any rectal bleeding or melena. No lower abdominal pain. Did not start fiber supplement after last visit.  Denies tobacco alcohol or NSAIDs.     Current Facility-Administered Medications:    0.9 %  sodium chloride  infusion, , Intravenous, Continuous, Damarrion Mimbs K, MD  Medications Prior to Admission  Medication Sig Dispense Refill Last Dose/Taking   albuterol  (VENTOLIN  HFA) 108 (90 Base) MCG/ACT inhaler Inhale 2 puffs into the lungs every 4 (four) hours as needed for wheezing or shortness of breath.   08/10/2023    alendronate  (FOSAMAX ) 70 MG tablet Take 70 mg by mouth every Monday.    08/10/2023   ARIPiprazole (ABILIFY) 5 MG tablet Take 5 mg by mouth daily.   08/10/2023   atorvastatin  (LIPITOR) 20 MG tablet Take 20 mg by mouth daily.   08/10/2023   busPIRone  (BUSPAR ) 15 MG tablet Take 15 mg by mouth 2 (two) times daily.   08/10/2023   donepezil  (ARICEPT ) 10 MG tablet Take 10 mg by mouth at bedtime.   08/10/2023   ELIQUIS  5 MG TABS tablet Take 5 mg by mouth 2 (two) times daily.   Past Week   furosemide  (LASIX ) 20 MG tablet Take 1 tablet (20 mg total) by mouth daily as needed for edema or fluid. Take as needed for up to 3 days for increased leg swelling, shortness of breath, weight gain 5+ lbs over 1-2 days. Seek medical care if these symptoms are not improving with increased dose.   08/10/2023   gabapentin  (NEURONTIN ) 100 MG capsule Take 200 mg by mouth 2 (two) times daily.   08/10/2023   gabapentin  (NEURONTIN ) 300 MG capsule Take 300 mg by mouth every evening.   08/10/2023   loratadine (CLARITIN) 10 MG tablet Take 10 mg by mouth daily.   08/10/2023   losartan  (COZAAR ) 50 MG tablet Take 50 mg by mouth daily.   08/10/2023   Multiple Vitamin (MULTIVITAMIN WITH MINERALS) TABS tablet Take 1 tablet by mouth daily.   Past Week   omeprazole (PRILOSEC) 20 MG capsule Take 20 mg by mouth daily.   08/10/2023   sertraline  (ZOLOFT ) 100 MG tablet Take 100 mg by mouth daily.   08/10/2023   Calcium  Carb-Cholecalciferol  600-10 MG-MCG CAPS Take 400 Units by  mouth 2 (two) times daily.      ondansetron  (ZOFRAN ) 4 MG/2ML SOLN injection Inject 2 mLs (4 mg total) into the vein every 6 (six) hours as needed for nausea.        Allergies  Allergen Reactions   Ciprofloxacin Hcl Itching    Rash and itching all over stomach and arms   Dopamine Nausea And Vomiting   Sulfa Antibiotics Rash   Sulfacetamide Sodium Rash   Tegaderm Ag Mesh [Silver] Other (See Comments) and Rash    Blisters and takes skin off     Past Medical History:  Diagnosis Date    AICD (automatic cardioverter/defibrillator) present    PLACED BY  DR  FERNANDE 2008   Angina    5-6 YRS AGO   Anxiety    Arthritis    Atherosclerosis of coronary artery without angina pectoris    Atrial fibrillation (HCC)    B12 deficiency    Cardiomyopathy (HCC)    CHF (congestive heart failure) (HCC)    Chronic a-fib (HCC)    Closed fracture of multiple ribs    Complication of anesthesia    states at times had a tough time waking up   Depression    Diabetes mellitus    Diverticulum of esophagus    large diverticulum in the middle third of the esophagus ([facility] EGD '09)   Dysrhythmia    A-FIB   Fibromyalgia    Headache(784.0)    Heartburn    History of stomach ulcers    History of transient ischemic attack (TIA)    LAST ONE IN  2005  IN  [place], [place]    HLD (hyperlipidemia)    Hypertension    Memory loss    Mild aortic valve stenosis    Moderate mitral insufficiency    Moderate tricuspid insufficiency    Osteoarthritis    RLS (restless legs syndrome)    Shortness of breath    EASING OFF NOW   Sleep apnea    DOESN'T KNOW SETTINGS   Solitary pulmonary nodule    Spinal stenosis in cervical region    Stroke (cerebrum) (HCC)    TIA (transient ischemic attack)    Tremor    Venous insufficiency    Vertigo     Review of systems:  Otherwise negative.    Physical Exam  Gen: Alert, oriented. Appears stated age.  HEENT: [place]/AT. PERRLA. Lungs: CTA, no wheezes. CV: RR nl S1, S2. Abd: soft, benign, no masses. BS+ Ext: No edema. Pulses 2+    Planned procedures: Proceed with EGD. The patient understands the nature of the planned procedure, indications, risks, alternatives and potential complications including but not limited to bleeding, infection, perforation, damage to internal organs and possible oversedation/side effects from anesthesia. The patient agrees and gives consent to proceed.  Please refer to procedure notes for findings, recommendations and patient  disposition/instructions.     [name] K. Burton, M.D. Gastroenterology 08/11/2023  9:44 AM

## 2023-08-11 NOTE — Op Note (Signed)
 [facility] Patient Name: [name] Procedure Date: 08/11/2023 9:45 AM MRN: [id number] Account #: [id number] Date of Birth: 08/28/1940 Admit Type: Outpatient Age: 83 Room: [facility] ENDO ROOM 1 Gender: Female Note Status: Finalized Instrument Name: Upper Endoscope [id number] Procedure:             Upper GI endoscopy Indications:           Dysphagia, Gastro-esophageal reflux disease, Abnormal                         cine-esophagram, Mid-esophageal diverticulum,                         dysmotility, distal esophageal stricture noted on                         barium study. Providers:             Rebekah Zackery K. Aundria MD, MD Referring MD:          Norleen CHARM Rower, MD (Referring MD) Medicines:             Propofol  per Anesthesia Complications:         No immediate complications. Estimated blood loss: None. Procedure:             Pre-Anesthesia Assessment:                        - The risks and benefits of the procedure and the                         sedation options and risks were discussed with the                         patient. All questions were answered and informed                         consent was obtained.                        - Patient identification and proposed procedure were                         verified prior to the procedure by the nurse. The                         procedure was verified in the procedure room.                        - ASA Grade Assessment: III - A patient with severe                         systemic disease.                        - After reviewing the risks and benefits, the patient                         was deemed in satisfactory condition to undergo the  procedure.                        After obtaining informed consent, the endoscope was                         passed under direct vision. Throughout the procedure,                         the patient's blood pressure, pulse, and oxygen                          saturations were monitored continuously. The Endoscope                         was introduced through the mouth, and advanced to the                         third part of duodenum. The upper GI endoscopy was                         accomplished without difficulty. The patient tolerated                         the procedure well. Findings:      Patchy, white plaques were found in the proximal esophagus. Cells for       cytology were obtained by brushing. Estimated blood loss: none.      There is no endoscopic evidence of diverticula in the entire esophagus.      One benign-appearing, intrinsic mild stenosis was found at the       gastroesophageal junction. This stenosis measured 1.6 cm (inner       diameter) x less than one cm (in length). The stenosis was traversed.       The scope was withdrawn. Dilation was performed with a Maloney dilator       with mild resistance at 60 Fr.      The exam of the esophagus was otherwise normal.      The stomach was normal.      The examined duodenum was normal. Impression:            - Esophageal plaques were found, consistent with                         candidiasis. Cells for cytology obtained.                        - Benign-appearing esophageal stenosis. Dilated.                        - Normal stomach.                        - Normal examined duodenum. Recommendation:        - Patient has a contact number available for                         emergencies. The signs and symptoms of potential  delayed complications were discussed with the patient.                         Return to normal activities tomorrow. Written                         discharge instructions were provided to the patient.                        - Resume previous diet.                        - Await pathology results.                        - Continue present medications.                        - Mycelex (clotrimazole) 10 mg lozenge 6x/day  for 2                         weeks.                        - Follow up with Romero Antigua, PA-C at [facility]. [phone].                        - Telephone GI clinic to schedule appointment in 3                         weeks.                        - The findings and recommendations were discussed with                         the patient. Procedure Code(s):     --- Professional ---                        [phone], Esophagogastroduodenoscopy, flexible,                         transoral; diagnostic, including collection of                         specimen(s) by brushing or washing, when performed                         (separate procedure)                        43450, Dilation of esophagus, by unguided sound or                         bougie, single or multiple passes Diagnosis Code(s):     --- Professional ---                        R93.3, Abnormal findings on diagnostic imaging  of                         other parts of digestive tract                        K21.9, Gastro-esophageal reflux disease without                         esophagitis                        R13.10, Dysphagia, unspecified                        K22.2, Esophageal obstruction                        K22.9, Disease of esophagus, unspecified CPT copyright 2022 American [employer] Association. All rights reserved. The codes documented in this report are preliminary and upon coder review may  be revised to meet current compliance requirements. Ladell [name] MD, MD 08/11/2023 10:34:15 AM This report has been signed electronically. Number of Addenda: 0 Note Initiated On: 08/11/2023 9:45 AM Estimated Blood Loss:  Estimated blood loss: none.      [facility]

## 2023-08-11 NOTE — Interval H&P Note (Signed)
 History and Physical Interval Note:  08/11/2023 9:49 AM  [name]  has presented today for surgery, with the diagnosis of Dysphagia, unspecified type (R13.10).  The various methods of treatment have been discussed with the patient and family. After consideration of risks, benefits and other options for treatment, the patient has consented to  Procedure(s) with comments: EGD (ESOPHAGOGASTRODUODENOSCOPY) (N/A) - DM as a surgical intervention.  The patient's history has been reviewed, patient examined, no change in status, stable for surgery.  I have reviewed the patient's chart and labs.  Questions were answered to the patient's satisfaction.     [place], Yoon Barca

## 2023-08-11 NOTE — Anesthesia Preprocedure Evaluation (Signed)
 Anesthesia Evaluation  Patient identified by MRN, date of birth, ID band Patient awake    Reviewed: Allergy & Precautions, NPO status , Patient's Chart, lab work & pertinent test results  History of Anesthesia Complications Negative for: history of anesthetic complications  Airway Mallampati: III   Neck ROM: Full    Dental  (+) Missing, Loose   Pulmonary sleep apnea and Continuous Positive Airway Pressure Ventilation , former smoker (quit 1993)   Pulmonary exam normal breath sounds clear to auscultation       Cardiovascular hypertension, + CAD and +CHF  Normal cardiovascular exam+ dysrhythmias (a fib) + pacemaker + Cardiac Defibrillator + Valvular Problems/Murmurs (MR; mild AS)  Rhythm:Regular Rate:Normal     Neuro/Psych  PSYCHIATRIC DISORDERS Anxiety Depression    TIACVA, No Residual Symptoms    GI/Hepatic negative GI ROS,,,  Endo/Other  diabetes, Type 2  Class 3 obesity  Renal/GU      Musculoskeletal  (+) Arthritis ,  Fibromyalgia -  Abdominal   Peds  Hematology negative hematology ROS (+)   Anesthesia Other Findings Cardiology note 07/21/23:  83 y.o. female with  Encounter Diagnoses  Name Primary?  Coronary artery disease involving native coronary artery of native heart without angina pectoris Yes  Chronic a-fib (CMS-HCC)  Moderate tricuspid insufficiency  Benign essential hypertension  Moderate mitral insufficiency  Mild aortic valve stenosis  Venous insufficiency  Cardiac resynchronization therapy defibrillator (CRT-D) in place  OSA (obstructive sleep apnea)  Chronic respiratory failure, unspecified whether with hypoxia or hypercapnia (CMS/HHS-HCC)  Carotid atherosclerosis, bilateral   Plan   No orders of the defined types were placed in this encounter.  Pt reports she is doing well overall from cardiac standpoint with minimal dyspnea. She is not very active and spends most of the day sedentary.  Uses w/c to transport to bathroom. Denies orthopnea. Reports improving BLE edema on reduced dose of furosemide . 1+ pitting noted on exam today, which is stable from prior visit. Without angina. Without palpitations. With improving dizziness. Carotid US  06/20/22 showed no significant stenosis. 2D ECHO 06/20/22 showed improved and preserved EF with moderate valvular insufficiency and mild AS. Dizziness s/s improved with discontinuation of carvedilol  and chlorthalidone. Last pacemaker interrogation showed battery longevity of 2.8 years without any therapies delivered.  - BP at goal today. No changes today. Continue current regimen. PCP at ALF-  - Without anginal chest pain. Continue Eliquis  and atorvastatin  for ASCVD prevention. - Well-functioning CRT-D for HF and chronic AF. With preserved EF on recent ECHO. Mild AS. Moderate valvular insufficiency. Stable BLE edema, which pt reports is improved. Without dyspnea or orthopnea. Surveillance ECHO in 2 years, sooner if s/s dictate. - Continue Eliquis  for stroke prevention with chronic AF and CHA2DS2-VASc Score 7 - Continue HLD, T2DM, and chronic respiratory failure management with PCP.  - Optimized for upcoming EGD, intermediate risk. Hold Eliquis  2 days before EGD, resume evening of day after procedure. Cardiac clearance provided.  Return in about 6 months (around 01/20/2024).    Reproductive/Obstetrics                              Anesthesia Physical Anesthesia Plan  ASA: 3  Anesthesia Plan: General   Post-op Pain Management:    Induction: Intravenous  PONV Risk Score and Plan: 3 and Propofol  infusion, TIVA and Treatment may vary due to age or medical condition  Airway Management Planned: Natural Airway  Additional Equipment:   Intra-op Plan:  Post-operative Plan:   Informed Consent: I have reviewed the patients History and Physical, chart, labs and discussed the procedure including the risks, benefits and  alternatives for the proposed anesthesia with the patient or authorized representative who has indicated his/her understanding and acceptance.   Patient has DNR.  Discussed DNR with patient and Suspend DNR.     Plan Discussed with: CRNA  Anesthesia Plan Comments: (LMA/GETA backup discussed.  Patient consented for risks of anesthesia including but not limited to:  - adverse reactions to medications - damage to eyes, teeth, lips or other oral mucosa - nerve damage due to positioning  - sore throat or hoarseness - damage to heart, brain, nerves, lungs, other parts of body or loss of life  Informed patient about role of CRNA in peri- and intra-operative care.  Patient voiced understanding.)        Anesthesia Quick Evaluation

## 2023-08-12 ENCOUNTER — Encounter: Payer: Self-pay | Admitting: Internal Medicine
